# Patient Record
Sex: Male | Born: 1955
Health system: Southern US, Community
[De-identification: ages and names within clinical notes are randomized; demographics above are authoritative.]

## PROBLEM LIST (undated history)

## (undated) DIAGNOSIS — G4730 Sleep apnea, unspecified: Secondary | ICD-10-CM

## (undated) DIAGNOSIS — R55 Syncope and collapse: Secondary | ICD-10-CM

## (undated) DIAGNOSIS — J449 Chronic obstructive pulmonary disease, unspecified: Secondary | ICD-10-CM

## (undated) DIAGNOSIS — K649 Unspecified hemorrhoids: Secondary | ICD-10-CM

## (undated) DIAGNOSIS — I10 Essential (primary) hypertension: Secondary | ICD-10-CM

## (undated) DIAGNOSIS — D126 Benign neoplasm of colon, unspecified: Secondary | ICD-10-CM

## (undated) DIAGNOSIS — S86019A Strain of unspecified Achilles tendon, initial encounter: Secondary | ICD-10-CM

## (undated) DIAGNOSIS — E7800 Pure hypercholesterolemia, unspecified: Secondary | ICD-10-CM

## (undated) HISTORY — DX: Essential (primary) hypertension: I10

## (undated) HISTORY — DX: Sleep apnea, unspecified: G47.30

## (undated) HISTORY — PX: CARDIAC CATHETERIZATION: SHX172

## (undated) HISTORY — DX: Syncope and collapse: R55

## (undated) HISTORY — PX: OTHER SURGICAL HISTORY: SHX169

## (undated) HISTORY — PX: ABDOMINAL HERNIA REPAIR: SHX539

## (undated) HISTORY — DX: Unspecified hemorrhoids: K64.9

## (undated) HISTORY — DX: Strain of unspecified achilles tendon, initial encounter: S86.019A

## (undated) HISTORY — PX: HEMORRHOID SURGERY: SHX153

## (undated) HISTORY — PX: HERNIA REPAIR: SHX51

## (undated) HISTORY — DX: Benign neoplasm of colon, unspecified: D12.6

---

## 1995-01-07 HISTORY — PX: HERNIA REPAIR: SHX51

## 1997-01-17 HISTORY — PX: HERNIA REPAIR: SHX51

## 2001-10-21 HISTORY — PX: OTHER SURGICAL HISTORY: SHX169

## 2004-01-13 ENCOUNTER — Other Ambulatory Visit: Payer: Self-pay

## 2005-10-21 HISTORY — PX: FEMORAL HERNIA REPAIR: SUR1179

## 2005-11-21 ENCOUNTER — Ambulatory Visit: Payer: Self-pay | Admitting: General Surgery

## 2005-12-11 ENCOUNTER — Other Ambulatory Visit: Payer: Self-pay

## 2005-12-18 ENCOUNTER — Ambulatory Visit: Payer: Self-pay | Admitting: General Surgery

## 2005-12-18 HISTORY — PX: HERNIA REPAIR: SHX51

## 2006-10-21 HISTORY — PX: OTHER SURGICAL HISTORY: SHX169

## 2007-06-09 ENCOUNTER — Ambulatory Visit: Payer: Self-pay | Admitting: Family Medicine

## 2007-06-09 DIAGNOSIS — J45909 Unspecified asthma, uncomplicated: Secondary | ICD-10-CM | POA: Insufficient documentation

## 2007-06-09 DIAGNOSIS — R079 Chest pain, unspecified: Secondary | ICD-10-CM | POA: Insufficient documentation

## 2007-06-09 DIAGNOSIS — I10 Essential (primary) hypertension: Secondary | ICD-10-CM | POA: Insufficient documentation

## 2007-06-09 DIAGNOSIS — I152 Hypertension secondary to endocrine disorders: Secondary | ICD-10-CM | POA: Insufficient documentation

## 2007-06-09 DIAGNOSIS — Z6833 Body mass index (BMI) 33.0-33.9, adult: Secondary | ICD-10-CM | POA: Insufficient documentation

## 2007-06-09 DIAGNOSIS — M109 Gout, unspecified: Secondary | ICD-10-CM | POA: Insufficient documentation

## 2007-06-10 ENCOUNTER — Ambulatory Visit: Payer: Self-pay | Admitting: Cardiology

## 2007-06-12 ENCOUNTER — Inpatient Hospital Stay (HOSPITAL_BASED_OUTPATIENT_CLINIC_OR_DEPARTMENT_OTHER): Admission: RE | Admit: 2007-06-12 | Discharge: 2007-06-12 | Payer: Self-pay | Admitting: Cardiology

## 2007-06-12 ENCOUNTER — Ambulatory Visit: Payer: Self-pay | Admitting: Cardiology

## 2007-06-15 ENCOUNTER — Ambulatory Visit: Payer: Self-pay | Admitting: Family Medicine

## 2007-06-17 ENCOUNTER — Encounter: Payer: Self-pay | Admitting: Family Medicine

## 2007-06-17 DIAGNOSIS — R7303 Prediabetes: Secondary | ICD-10-CM | POA: Insufficient documentation

## 2007-06-17 DIAGNOSIS — E1169 Type 2 diabetes mellitus with other specified complication: Secondary | ICD-10-CM | POA: Insufficient documentation

## 2007-06-17 DIAGNOSIS — E785 Hyperlipidemia, unspecified: Secondary | ICD-10-CM | POA: Insufficient documentation

## 2007-06-17 LAB — CONVERTED CEMR LAB
ALT: 28 units/L (ref 0–53)
AST: 28 units/L (ref 0–37)
Albumin: 3.8 g/dL (ref 3.5–5.2)
Alkaline Phosphatase: 75 units/L (ref 39–117)
BUN: 10 mg/dL (ref 6–23)
Bilirubin, Direct: 0.1 mg/dL (ref 0.0–0.3)
CO2: 32 meq/L (ref 19–32)
Calcium: 9.4 mg/dL (ref 8.4–10.5)
Chloride: 102 meq/L (ref 96–112)
Cholesterol: 243 mg/dL (ref 0–200)
Creatinine, Ser: 0.8 mg/dL (ref 0.4–1.5)
Direct LDL: 157.9 mg/dL
GFR calc Af Amer: 132 mL/min
GFR calc non Af Amer: 109 mL/min
Glucose, Bld: 118 mg/dL — ABNORMAL HIGH (ref 70–99)
HDL: 47.6 mg/dL (ref >39.0)
Hemoglobin: 15.7 g/dL (ref 13.0–17.0)
Potassium: 4.7 meq/L (ref 3.5–5.1)
Sodium: 142 meq/L (ref 135–145)
TSH: 1.57 microintl units/mL (ref 0.35–5.50)
Total Bilirubin: 1 mg/dL (ref 0.3–1.2)
Total CHOL/HDL Ratio: 5.1
Total Protein: 6.7 g/dL (ref 6.0–8.3)
Triglycerides: 253 mg/dL (ref 0–149)
VLDL: 51 mg/dL — ABNORMAL HIGH (ref 0–40)

## 2007-06-23 ENCOUNTER — Ambulatory Visit: Payer: Self-pay | Admitting: Family Medicine

## 2007-06-23 LAB — CONVERTED CEMR LAB
Cholesterol, target level: 200 mg/dL
HDL goal, serum: 40 mg/dL
LDL Goal: 130 mg/dL

## 2007-06-24 LAB — CONVERTED CEMR LAB
BUN: 16 mg/dL (ref 6–23)
CO2: 32 meq/L (ref 19–32)
Calcium: 9.2 mg/dL (ref 8.4–10.5)
Chloride: 100 meq/L (ref 96–112)
Creatinine, Ser: 1 mg/dL (ref 0.4–1.5)
GFR calc Af Amer: 102 mL/min
GFR calc non Af Amer: 84 mL/min
Glucose, Bld: 106 mg/dL — ABNORMAL HIGH (ref 70–99)
Potassium: 4.1 meq/L (ref 3.5–5.1)
Sodium: 139 meq/L (ref 135–145)

## 2007-06-26 ENCOUNTER — Ambulatory Visit: Payer: Self-pay | Admitting: Cardiology

## 2007-08-19 ENCOUNTER — Encounter: Payer: Self-pay | Admitting: Family Medicine

## 2007-08-20 DIAGNOSIS — N411 Chronic prostatitis: Secondary | ICD-10-CM | POA: Insufficient documentation

## 2007-08-20 DIAGNOSIS — Z87442 Personal history of urinary calculi: Secondary | ICD-10-CM | POA: Insufficient documentation

## 2007-09-16 ENCOUNTER — Encounter (INDEPENDENT_AMBULATORY_CARE_PROVIDER_SITE_OTHER): Payer: Self-pay | Admitting: *Deleted

## 2007-10-27 ENCOUNTER — Ambulatory Visit: Payer: Self-pay | Admitting: Family Medicine

## 2007-11-02 ENCOUNTER — Ambulatory Visit: Payer: Self-pay | Admitting: Family Medicine

## 2007-11-02 LAB — CONVERTED CEMR LAB
ALT: 34 units/L (ref 0–53)
AST: 27 units/L (ref 0–37)
Albumin: 3.9 g/dL (ref 3.5–5.2)
Alkaline Phosphatase: 69 units/L (ref 39–117)
BUN: 9 mg/dL (ref 6–23)
Bilirubin, Direct: 0.1 mg/dL (ref 0.0–0.3)
CO2: 30 meq/L (ref 19–32)
Calcium: 9.2 mg/dL (ref 8.4–10.5)
Chloride: 99 meq/L (ref 96–112)
Cholesterol: 182 mg/dL (ref 0–200)
Creatinine, Ser: 0.9 mg/dL (ref 0.4–1.5)
GFR calc Af Amer: 115 mL/min
GFR calc non Af Amer: 95 mL/min
Glucose, Bld: 109 mg/dL — ABNORMAL HIGH (ref 70–99)
HDL: 58.8 mg/dL (ref >39.0)
LDL Cholesterol: 98 mg/dL (ref 0–99)
Potassium: 4.3 meq/L (ref 3.5–5.1)
Sodium: 137 meq/L (ref 135–145)
Total Bilirubin: 1.1 mg/dL (ref 0.3–1.2)
Total CHOL/HDL Ratio: 3.1
Total Protein: 6.8 g/dL (ref 6.0–8.3)
Triglycerides: 127 mg/dL (ref 0–149)
VLDL: 25 mg/dL (ref 0–40)

## 2007-11-12 ENCOUNTER — Telehealth: Payer: Self-pay | Admitting: Family Medicine

## 2007-12-08 ENCOUNTER — Ambulatory Visit: Payer: Self-pay | Admitting: Family Medicine

## 2007-12-08 DIAGNOSIS — I2510 Atherosclerotic heart disease of native coronary artery without angina pectoris: Secondary | ICD-10-CM | POA: Insufficient documentation

## 2008-02-05 ENCOUNTER — Ambulatory Visit: Payer: Self-pay | Admitting: Urology

## 2008-02-05 ENCOUNTER — Encounter: Payer: Self-pay | Admitting: Family Medicine

## 2008-02-09 ENCOUNTER — Ambulatory Visit: Payer: Self-pay | Admitting: Urology

## 2008-02-22 ENCOUNTER — Telehealth: Payer: Self-pay | Admitting: Family Medicine

## 2008-04-01 ENCOUNTER — Encounter: Payer: Self-pay | Admitting: Family Medicine

## 2008-04-29 ENCOUNTER — Ambulatory Visit: Payer: Self-pay | Admitting: Family Medicine

## 2008-05-04 ENCOUNTER — Ambulatory Visit: Payer: Self-pay | Admitting: Family Medicine

## 2008-05-04 DIAGNOSIS — M542 Cervicalgia: Secondary | ICD-10-CM | POA: Insufficient documentation

## 2008-05-04 DIAGNOSIS — L989 Disorder of the skin and subcutaneous tissue, unspecified: Secondary | ICD-10-CM | POA: Insufficient documentation

## 2008-05-04 LAB — CONVERTED CEMR LAB
ALT: 35 units/L (ref 0–53)
AST: 38 units/L — ABNORMAL HIGH (ref 0–37)
Albumin: 4 g/dL (ref 3.5–5.2)
Alkaline Phosphatase: 64 units/L (ref 39–117)
BUN: 13 mg/dL (ref 6–23)
Bilirubin, Direct: 0.1 mg/dL (ref 0.0–0.3)
CO2: 30 meq/L (ref 19–32)
Calcium: 9.3 mg/dL (ref 8.4–10.5)
Chloride: 103 meq/L (ref 96–112)
Cholesterol: 159 mg/dL (ref 0–200)
Creatinine, Ser: 0.9 mg/dL (ref 0.4–1.5)
GFR calc Af Amer: 114 mL/min
GFR calc non Af Amer: 95 mL/min
Glucose, Bld: 112 mg/dL — ABNORMAL HIGH (ref 70–99)
HDL: 60.4 mg/dL (ref >39.0)
LDL Cholesterol: 79 mg/dL (ref 0–99)
Potassium: 4.3 meq/L (ref 3.5–5.1)
Sodium: 141 meq/L (ref 135–145)
Total Bilirubin: 1.2 mg/dL (ref 0.3–1.2)
Total CHOL/HDL Ratio: 2.6
Total Protein: 6.6 g/dL (ref 6.0–8.3)
Triglycerides: 100 mg/dL (ref 0–149)
VLDL: 20 mg/dL (ref 0–40)

## 2008-06-06 ENCOUNTER — Telehealth: Payer: Self-pay | Admitting: Family Medicine

## 2008-09-06 ENCOUNTER — Telehealth: Payer: Self-pay | Admitting: Family Medicine

## 2008-10-24 ENCOUNTER — Encounter: Payer: Self-pay | Admitting: Family Medicine

## 2009-01-19 ENCOUNTER — Encounter: Payer: Self-pay | Admitting: Family Medicine

## 2009-06-23 ENCOUNTER — Ambulatory Visit: Payer: Self-pay | Admitting: Family Medicine

## 2009-06-26 LAB — CONVERTED CEMR LAB
ALT: 37 units/L (ref 0–53)
AST: 39 units/L — ABNORMAL HIGH (ref 0–37)
Albumin: 3.8 g/dL (ref 3.5–5.2)
Alkaline Phosphatase: 48 units/L (ref 39–117)
BUN: 15 mg/dL (ref 6–23)
Bilirubin, Direct: 0 mg/dL (ref 0.0–0.3)
CO2: 30 meq/L (ref 19–32)
Calcium: 9.1 mg/dL (ref 8.4–10.5)
Chloride: 105 meq/L (ref 96–112)
Cholesterol: 204 mg/dL — ABNORMAL HIGH (ref 0–200)
Creatinine, Ser: 0.8 mg/dL (ref 0.4–1.5)
Direct LDL: 127.4 mg/dL
GFR calc non Af Amer: 107.64 mL/min (ref >60)
Glucose, Bld: 116 mg/dL — ABNORMAL HIGH (ref 70–99)
HDL: 65 mg/dL (ref >39.00)
PSA: 0.32 ng/mL (ref 0.10–4.00)
Potassium: 4.1 meq/L (ref 3.5–5.1)
Sodium: 142 meq/L (ref 135–145)
Total Bilirubin: 0.8 mg/dL (ref 0.3–1.2)
Total CHOL/HDL Ratio: 3
Total Protein: 6.5 g/dL (ref 6.0–8.3)
Triglycerides: 104 mg/dL (ref 0.0–149.0)
VLDL: 20.8 mg/dL (ref 0.0–40.0)

## 2009-06-30 ENCOUNTER — Ambulatory Visit: Payer: Self-pay | Admitting: Family Medicine

## 2009-10-21 HISTORY — PX: COLONOSCOPY: SHX174

## 2009-12-28 ENCOUNTER — Encounter: Payer: Self-pay | Admitting: Family Medicine

## 2009-12-28 ENCOUNTER — Ambulatory Visit: Payer: Self-pay | Admitting: Urology

## 2010-03-08 ENCOUNTER — Encounter: Payer: Self-pay | Admitting: Family Medicine

## 2010-04-06 ENCOUNTER — Encounter: Payer: Self-pay | Admitting: Family Medicine

## 2010-04-06 DIAGNOSIS — D126 Benign neoplasm of colon, unspecified: Secondary | ICD-10-CM

## 2010-04-06 HISTORY — DX: Benign neoplasm of colon, unspecified: D12.6

## 2010-04-16 ENCOUNTER — Encounter: Payer: Self-pay | Admitting: Family Medicine

## 2010-06-09 LAB — HM COLONOSCOPY: HM Colonoscopy: 4

## 2010-07-21 DIAGNOSIS — S86019A Strain of unspecified Achilles tendon, initial encounter: Secondary | ICD-10-CM

## 2010-07-21 DIAGNOSIS — R55 Syncope and collapse: Secondary | ICD-10-CM

## 2010-07-21 HISTORY — DX: Syncope and collapse: R55

## 2010-07-21 HISTORY — DX: Strain of unspecified achilles tendon, initial encounter: S86.019A

## 2010-07-29 ENCOUNTER — Ambulatory Visit: Payer: Self-pay | Admitting: Cardiovascular Disease

## 2010-07-29 ENCOUNTER — Inpatient Hospital Stay: Payer: Self-pay | Admitting: Internal Medicine

## 2010-07-30 ENCOUNTER — Encounter: Payer: Self-pay | Admitting: Cardiology

## 2010-07-31 ENCOUNTER — Encounter: Payer: Self-pay | Admitting: Family Medicine

## 2010-08-01 ENCOUNTER — Encounter: Payer: Self-pay | Admitting: Cardiovascular Disease

## 2010-08-01 ENCOUNTER — Encounter: Payer: Self-pay | Admitting: Family Medicine

## 2010-08-02 ENCOUNTER — Telehealth (INDEPENDENT_AMBULATORY_CARE_PROVIDER_SITE_OTHER): Payer: Self-pay

## 2010-08-07 ENCOUNTER — Ambulatory Visit: Payer: Self-pay | Admitting: Cardiology

## 2010-08-07 DIAGNOSIS — R55 Syncope and collapse: Secondary | ICD-10-CM | POA: Insufficient documentation

## 2010-08-10 ENCOUNTER — Telehealth (INDEPENDENT_AMBULATORY_CARE_PROVIDER_SITE_OTHER): Payer: Self-pay | Admitting: *Deleted

## 2010-08-14 ENCOUNTER — Ambulatory Visit: Payer: Self-pay | Admitting: Family Medicine

## 2010-08-14 LAB — CONVERTED CEMR LAB
ALT: 69 units/L — ABNORMAL HIGH (ref 0–53)
AST: 41 units/L — ABNORMAL HIGH (ref 0–37)
Albumin: 3.6 g/dL (ref 3.5–5.2)
Alkaline Phosphatase: 58 units/L (ref 39–117)
BUN: 8 mg/dL (ref 6–23)
Bilirubin, Direct: 0.2 mg/dL (ref 0.0–0.3)
CO2: 28 meq/L (ref 19–32)
Calcium: 9 mg/dL (ref 8.4–10.5)
Chloride: 103 meq/L (ref 96–112)
Cholesterol: 178 mg/dL (ref 0–200)
Creatinine, Ser: 0.8 mg/dL (ref 0.4–1.5)
GFR calc non Af Amer: 102.71 mL/min (ref >60)
Glucose, Bld: 105 mg/dL — ABNORMAL HIGH (ref 70–99)
HDL: 50.9 mg/dL (ref >39.00)
LDL Cholesterol: 99 mg/dL (ref 0–99)
Potassium: 4.2 meq/L (ref 3.5–5.1)
Sodium: 138 meq/L (ref 135–145)
Total Bilirubin: 1.2 mg/dL (ref 0.3–1.2)
Total CHOL/HDL Ratio: 3
Total Protein: 6.2 g/dL (ref 6.0–8.3)
Triglycerides: 143 mg/dL (ref 0.0–149.0)
Uric Acid, Serum: 5.5 mg/dL (ref 4.0–7.8)
VLDL: 28.6 mg/dL (ref 0.0–40.0)

## 2010-08-21 ENCOUNTER — Ambulatory Visit: Payer: Self-pay | Admitting: Family Medicine

## 2010-08-21 DIAGNOSIS — R740 Nonspecific elevation of levels of transaminase and lactic acid dehydrogenase [LDH]: Secondary | ICD-10-CM

## 2010-08-21 DIAGNOSIS — R7402 Elevation of levels of lactic acid dehydrogenase (LDH): Secondary | ICD-10-CM | POA: Insufficient documentation

## 2010-08-21 DIAGNOSIS — R7401 Elevation of levels of liver transaminase levels: Secondary | ICD-10-CM | POA: Insufficient documentation

## 2010-08-22 ENCOUNTER — Telehealth: Payer: Self-pay | Admitting: Cardiology

## 2010-08-31 ENCOUNTER — Encounter: Payer: Self-pay | Admitting: Family Medicine

## 2010-09-03 ENCOUNTER — Encounter: Payer: Self-pay | Admitting: Family Medicine

## 2010-09-04 ENCOUNTER — Ambulatory Visit: Payer: Self-pay | Admitting: General Surgery

## 2010-09-06 ENCOUNTER — Telehealth: Payer: Self-pay | Admitting: Cardiology

## 2010-09-10 ENCOUNTER — Ambulatory Visit: Payer: Self-pay | Admitting: General Surgery

## 2010-09-10 ENCOUNTER — Encounter: Payer: Self-pay | Admitting: Family Medicine

## 2010-09-10 HISTORY — PX: HERNIA REPAIR: SHX51

## 2010-09-14 LAB — PATHOLOGY REPORT

## 2010-09-17 ENCOUNTER — Telehealth: Payer: Self-pay | Admitting: Family Medicine

## 2010-09-20 ENCOUNTER — Encounter: Payer: Self-pay | Admitting: Family Medicine

## 2010-10-25 ENCOUNTER — Encounter: Payer: Self-pay | Admitting: Family Medicine

## 2010-11-13 ENCOUNTER — Telehealth: Payer: Self-pay | Admitting: Family Medicine

## 2010-11-19 ENCOUNTER — Ambulatory Visit
Admission: RE | Admit: 2010-11-19 | Discharge: 2010-11-19 | Payer: Self-pay | Source: Home / Self Care | Attending: Family Medicine | Admitting: Family Medicine

## 2010-11-19 ENCOUNTER — Encounter: Payer: Self-pay | Admitting: Family Medicine

## 2010-11-19 ENCOUNTER — Other Ambulatory Visit: Payer: Self-pay | Admitting: Family Medicine

## 2010-11-19 LAB — HEPATIC FUNCTION PANEL
ALT: 18 U/L (ref 0–53)
AST: 20 U/L (ref 0–37)
Albumin: 3.7 g/dL (ref 3.5–5.2)
Alkaline Phosphatase: 74 U/L (ref 39–117)
Bilirubin, Direct: 0.1 mg/dL (ref 0.0–0.3)
Total Bilirubin: 0.8 mg/dL (ref 0.3–1.2)
Total Protein: 6.4 g/dL (ref 6.0–8.3)

## 2010-11-19 LAB — BASIC METABOLIC PANEL
BUN: 13 mg/dL (ref 6–23)
CO2: 28 mEq/L (ref 19–32)
Calcium: 8.7 mg/dL (ref 8.4–10.5)
Chloride: 100 mEq/L (ref 96–112)
Creatinine, Ser: 0.8 mg/dL (ref 0.4–1.5)
GFR: 107.07 mL/min (ref >60.00)
Glucose, Bld: 102 mg/dL — ABNORMAL HIGH (ref 70–99)
Potassium: 4.2 mEq/L (ref 3.5–5.1)
Sodium: 136 mEq/L (ref 135–145)

## 2010-11-21 LAB — CONVERTED CEMR LAB
HCV Ab: NEGATIVE
Hep A IgM: NEGATIVE
Hep B C IgM: NEGATIVE
Hepatitis B Surface Ag: NEGATIVE

## 2010-11-21 NOTE — Letter (Signed)
Summary: [facility]  [facility]   Imported By: Maryln Gottron 09/14/2010 10:32:18  _____________________________________________________________________  External Attachment:    Type:   Image     Comment:   External Document

## 2010-11-21 NOTE — Letter (Signed)
Summary: [facility]  [facility]   Imported By: Lanelle Bal 03/15/2010 09:02:22  _____________________________________________________________________  External Attachment:    Type:   Image     Comment:   External Document

## 2010-11-21 NOTE — Letter (Signed)
Summary: [facility]  [facility]   Imported By: Lanelle Bal 01/03/2010 14:23:19  _____________________________________________________________________  External Attachment:    Type:   Image     Comment:   External Document

## 2010-11-21 NOTE — Assessment & Plan Note (Signed)
Summary: CPX/CLE   Vital Signs:  Patient profile:   55 year old male Temp:     98.7 degrees F oral Pulse rate:   80 / minute Pulse rhythm:   regular BP sitting:   130 / 72  (left arm) Cuff size:   regular  Vitals Entered By: Linde Gillis CMA Duncan Dull) (August 21, 2010 12:23 PM) CC: complete physical, Lipid Management   History of Present Illness: 55 yo with history of left heart cath without significant disease in 2008, HTN, and hyperlipidemia presents for hospital followup.  Earlier this month, patient developed a respiratory illness with constant cough and wheezing.  It is likely that he had a URI that progressed to an asthma exacerbation.  He had a hard time sleeping due to cough.  He would get lightheaded with paroxysms of cough.  On 10/9, he was sitting on the side of the bed talking to his daughter when he abruptly lost consciousness and fell.  In the process, he caught his foot under the bed and tore the Achilles tendon on the right.  He had been up all night coughing prior to this, but it appears that he was not coughing directly before the syncopal event.  He regained consciousness quickly.  He had no prodrome of lightheadedness, palpitations, or chest pain.  He was admitted to [facility].  Echo there showed mild LVH, EF>55%, no significant valvular abnormalities.  CT head and carotid US were unremarkable.  Cardiac enzymes and D dimer negative.  No arrhythmias on telemetry.  His Achilles tendon was repaired and he is now in a cast.  It was recommended that he get a heart monitor at discharge, but he refused.    Since discharge, no problems with lightheadedness or syncope.  Cough has resolved.   He followed up with Dr. Shirlee Latch (Cards)(note reviewed) who recommended Holter event monitor.Ilene Qua refused.  Seeing Ortho (dr. Hyacinth Meeker) for achilles tear.  HTN..was elevated...cards increase lisinopril.Cody Kitchennow well controlled at home...118/70s  Acute bronchtitis treated Z-pack. Improved.  On  testosterone for hypogonadism..per Dr. Achilles Dunk urologist...has helped with energy.  Last prostate check in early 2011.   Elevated LFTS.Cody KitchenMarland Day? possible neg hepatitis test in past years...no hisotry of injectable drugs.  Does drink alcohol but none since discharge for hospital. No tylenol. Using ibuprofen nightly in past but since discharge..but took vicodin at night 2 tabs.  On statin med.  Per records nml in hospital  Lipid Management History:      Positive NCEP/ATP III risk factors include male age 27 years old or older, hypertension, and ASHD (either angina/prior MI/prior CABG).  Negative NCEP/ATP III risk factors include non-diabetic, no family history for ischemic heart disease, no prior stroke/TIA, and no peripheral vascular disease.        His compliance with the TLC diet is poor.     Problems Prior to Update: 1)  Dyspnea On Exertion  (ICD-786.09) 2)  Transaminases, Serum, Elevated  (ICD-790.4) 3)  Tremor, Essential  (ICD-333.1) 4)  Syncope and Collapse  (ICD-780.2) 5)  Physical Examination  (ICD-V70.0) 6)  Screening, Colon Cancer  (ICD-V76.51) 7)  Special Screening Malignant Neoplasm of Prostate  (ICD-V76.44) 8)  Skin Lesions, Multiple  (ICD-709.9) 9)  Neck Pain, Right  (ICD-723.1) 10)  Hip Pain, Left  (ICD-719.45) 11)  Cad  (ICD-414.00) 12)  Nephrolithiasis, Hx of  (ICD-V13.01) 13)  Prostatitis, Chronic  (ICD-601.1) 14)  Prediabetes  (ICD-790.29) 15)  Hyperlipidemia  (ICD-272.4) 16)  Chest Pain  (ICD-786.50) 17)  Gout  (  ICD-274.9) 18)  Family History Diabetes 1st Degree Relative  (ICD-V18.0) 19)  COPD  (ICD-496) 20)  Hypertension  (ICD-401.9) 21)  Morbid Obesity  (ICD-278.01)  Current Medications (verified): 1)  Multivitamins   Tabs (Multiple Vitamin) .... Once Daily 2)  Simvastatin 40 Mg  Tabs (Simvastatin) .... Take 1 Tablet By Mouth Once A Day 3)  Aspirin 81 Mg  Tabs (Aspirin) .... Take 1 Tablet By Mouth Once A Day 4)  Lisinopril 20 Mg Tabs (Lisinopril) .... Take One  Tablet By Mouth Daily 5)  Meloxicam 7.5 Mg Tabs (Meloxicam) .... One Tablet Once Daily 6)  Vitamin B-1 100 Mg Tabs (Thiamine Hcl) .... One Tablet Once Daily 7)  Advair Diskus 250-50 Mcg/dose Aepb (Fluticasone-Salmeterol) 8)  Fish Oil 1000 Mg Caps (Omega-3 Fatty Acids) .... One Tablet Once Daily 9)  Garlic Oil 1000 Mg Caps (Garlic) .... Takes Two Tablets Once Daily 10)  Glucosamine 500 Mg Caps (Glucosamine Sulfate) .... Two Tablets Daily 11)  Testosterone Cypionate 200 Mg/ml Oil (Testosterone Cypionate)  Allergies: 1)  ! Penicillin V Potassium (Penicillin V Potassium)  Past History:  Past medical, surgical, family and social histories (including risk factors) reviewed, and no changes noted (except as noted below).  Past Medical History: Reviewed history from 08/07/2010 and no changes required. 1. Hypertension 2. Asthma 3. Gout 4. Syncope: 10/11 in setting of asthma exacerbation with coughing. Echo (10/11): EF > 55%, mild LVH, grade I diastolic dysfunction, normal RV size and systolic function, normal valves.  Carotid US (10/11): Minimal disease.  5. Achilles tendon rupture and repair 10/11 6. LHC (2008): Minimal luminal irregularities.  EF 55%.   Past Surgical History: Reviewed history from 06/09/2007 and no changes required. treadmill stress test 2003 intestinal blockage as a child several abdominal hernia repairs 1998, 2000 femoral hernia  2007 kidney stone extraction, uric acid stones also calcium kidney stones hemmorhoid surgery 1980s  Family History: Reviewed history from 08/07/2010 and no changes required. father  HTN, cataracts, spots on lungs ? asbestosis, DM mother  MI at age 67 , CAD sister HTN, depression brothers colon polyps, HTN aunt colon cancer Family History Diabetes 1st degree relative  Social History: Reviewed history from 08/07/2010 and no changes required. pot smoking x 27 years Diet: poor diet, lots of fats 6 beers daily Quit smoking cigars    Occupation: sells lumbar, in milling area, very physical Regular exercise-no Drug use-no, used to use cocaine and methamphetamine Married Originally from [place]  Review of Systems       tremor..multiple family members with same. General:  Denies fatigue and fever. CV:  Denies chest pain or discomfort. Resp:  Complains of shortness of breath; denies sputum productive and wheezing; In hospital told he may have asthma.Cody Day GI:  Denies abdominal pain. GU:  Denies dysuria.  Physical Exam  General:  Well-developed,well-nourished,in no acute distress; alert,appropriate and cooperative throughout examination Eyes:  No corneal or conjunctival inflammation noted. EOMI. Perrla. Funduscopic exam benign, without hemorrhages, exudates or papilledema. Vision grossly normal. Ears:  External ear exam shows no significant lesions or deformities.  Otoscopic examination reveals clear canals, tympanic membranes are intact bilaterally without bulging, retraction, inflammation or discharge. Hearing is grossly normal bilaterally. Nose:  External nasal examination shows no deformity or inflammation. Nasal mucosa are pink and moist without lesions or exudates. Mouth:  Oral mucosa and oropharynx without lesions or exudates.  Teeth in good repair. Neck:  ttp over right SCM and anteriorly no carotid bruit or thyromegaly  Lungs:  Normal respiratory  effort, chest expands symmetrically. Lungs are clear to auscultation, no crackles or wheezes. Heart:  Normal rate and regular rhythm. S1 and S2 normal without gallop, murmur, click, rub or other extra sounds. Abdomen:  obese, soft, non-tender, normal bowel sounds, no distention, no masses, no guarding, no rigidity, and no rebound tenderness, reducible abdominal and umbilical hernia  Rectal:  Per Dr. Joseph Berkshire:  Per Dr. Achilles Dunk. Prostate:  Per Dr. Achilles Dunk Pulses:  R and L posterior tibial pulses are full and equal bilaterally  Extremities:  B varicosities Neurologic:   Resting tremor. alert & oriented X3, cranial nerves II-XII intact, strength normal in all extremities, and sensation intact to light touch.   Skin:  skin tag on right eyelid, ? seb ker on left upper chest Psych:  Cognition and judgment appear intact. Alert and cooperative with normal attention span and concentration. No apparent delusions, illusions, hallucinations   Impression & Recommendations:  Problem # 1:  PHYSICAL EXAMINATION (ICD-V70.0) The patient's preventative maintenance and recommended screening tests for an annual wellness exam were reviewed in full today. Brought up to date unless services declined.  Counselled on the importance of diet, exercise, and its role in overall health and mortality. The patient's FH and SH was reviewed, including their home life, tobacco status, and drug and alcohol status.     Problem # 2:  TRANSAMINASES, SERUM, ELEVATED (ICD-790.4) No ibuprofen or tylenol. Stay off vicodin.  Stay off alcohol. Recheck hepatic panel and acute hepatitis panel in 4 weeks.   Problem # 3:  DYSPNEA ON EXERTION (ICD-786.09) Recent respiratory illness..with significant SOB.Cody Kitcheneval further with Pulmonary lung function tests pre and post albuterol as he was told in hospital he may have asthma.   The following medications were removed from the medication list:    Metoprolol Succinate 50 Mg Xr24h-tab (Metoprolol succinate) .Cody Day... Take one tablet by mouth daily His updated medication list for this problem includes:    Lisinopril 20 Mg Tabs (Lisinopril) .Cody Day... Take one tablet by mouth daily    Advair Diskus 250-50 Mcg/dose Aepb (Fluticasone-salmeterol)  Orders: Misc. Referral (Misc. Ref)  Problem # 4:  TREMOR, ESSENTIAL (ICD-333.1) Likely familial..not currently interesed in med to treat.  Problem # 5:  SYNCOPE AND COLLAPSE (ICD-780.2) Likely vasovagal due to cough. per cards no clear  cardiac source.Cody KitchenMarland KitchenI agree with Holter moniter given V tach run in hopsital though.  Pt  will consider.    Complete Medication List: 1)  Multivitamins Tabs (Multiple vitamin) .... Once daily 2)  Simvastatin 40 Mg Tabs (Simvastatin) .... Take 1 tablet by mouth once a day 3)  Aspirin 81 Mg Tabs (Aspirin) .... Take 1 tablet by mouth once a day 4)  Lisinopril 20 Mg Tabs (Lisinopril) .... Take one tablet by mouth daily 5)  Meloxicam 7.5 Mg Tabs (Meloxicam) .... One tablet once daily 6)  Vitamin B-1 100 Mg Tabs (Thiamine hcl) .... One tablet once daily 7)  Advair Diskus 250-50 Mcg/dose Aepb (Fluticasone-salmeterol) 8)  Fish Oil 1000 Mg Caps (Omega-3 fatty acids) .... One tablet once daily 9)  Garlic Oil 1000 Mg Caps (Garlic) .... Takes two tablets once daily 10)  Glucosamine 500 Mg Caps (Glucosamine sulfate) .... Two tablets daily 11)  Testosterone Cypionate 200 Mg/ml Oil (Testosterone cypionate)  Lipid Assessment/Plan:      Based on NCEP/ATP III, the patient's risk factor category is "history of coronary disease, peripheral vascular disease, cerebrovascular disease, or aortic aneurysm".  The patient's lipid goals are as follows: Total cholesterol goal is 200;  LDL cholesterol goal is 130; HDL cholesterol goal is 40; Triglyceride goal is 150.  His LDL cholesterol goal has been met.    Patient Instructions: 1)  No ibuprofen or tylenol. 2)  Stay off vicodin. 3)   Stay off alcohol. 4)  Recheck hepatic panel and acute hepatitis panel in 4 weeks.  5)  Referral Appointment Information 6)  Day/Date: 7)  Time: 8)  Place/MD: 9)  Address: 10)  Phone/Fax: 11)  Patient given appointment information. Information/Orders faxed/mailed.  12)  Call if interested in treatment of familial tremor. 13)  Please schedule a follow-up appointment in 6 months .    Orders Added: 1)  Misc. Referral [Misc. Ref] 2)  Est. Patient 40-64 years [phone]    Current Allergies (reviewed today): ! PENICILLIN V POTASSIUM (PENICILLIN V POTASSIUM)

## 2010-11-21 NOTE — Miscellaneous (Signed)
Summary: Metoprolol  Clinical Lists Changes  Medications: Added new medication of METOPROLOL SUCCINATE 50 MG XR24H-TAB (METOPROLOL SUCCINATE) Take one tablet by mouth daily - Signed Rx of METOPROLOL SUCCINATE 50 MG XR24H-TAB (METOPROLOL SUCCINATE) Take one tablet by mouth daily;  #90 x 4;  Signed;  Entered by: Benedict Needy, RN;  Authorized by: Dossie Arbour MD;  Method used: Electronically to [facility] [address], [place], [place], [place]  [id number], Ph: [phone], Fax: [phone]    Prescriptions: METOPROLOL SUCCINATE 50 MG XR24H-TAB (METOPROLOL SUCCINATE) Take one tablet by mouth daily  #90 x 4   Entered by:   Benedict Needy, RN   Authorized by:   Dossie Arbour MD   Signed by:   Benedict Needy, RN on 08/01/2010   Method used:   Electronically to        [facility] (retail)       [address]       [place], [place]  [id number]       Ph: [phone]       Fax: [phone]   RxID:   [id number]

## 2010-11-21 NOTE — Progress Notes (Signed)
Summary: Medications  Phone Note Call from Patient   Caller: Patient Call For: [facility] Summary of Call: Pt called had medication questions.  Was started on lisinopril 10mg  and toprol 50mg  in hospital. On our medication list he was taking toprol 50 and lisinopril-hctz. LMOM TCB  Initial call taken by: Benedict Needy, RN,  August 02, 2010 5:16 PM  Follow-up for Phone Call        Pt instructed to continue taking medications ordered while in hospital and take BP's twice a day and bring those recordings into his appt with Dr. Shirlee Latch 10/18 Follow-up by: Benedict Needy, RN,  August 03, 2010 8:48 AM

## 2010-11-21 NOTE — Progress Notes (Signed)
Summary: Toprol rx  Phone Note Call from Patient Call back at Home Phone [phone]   Caller: SELF Call For: [facility] Summary of Call: Pt has questions about whether or not he is supposed to be taking Toperol-Pt is going into surgery Initial call taken by: Harlon Flor,  September 06, 2010 1:54 PM  Follow-up for Phone Call        Called spoke with pt, pt states his rx bottle for Metoprolol Succinate 50mg  once daily does not have any refills so he was unsure if he is to continue on this.  Advised pt rx sent by [place] to [facility] on 08/01/10 gave pt 90 tablets with 4 refills.  He should continue on this medication, and advised him TCB if runs into problems refilling rx at pharmacy.  Follow-up by: Cloyde Reams RN,  September 06, 2010 2:15 PM

## 2010-11-21 NOTE — Letter (Signed)
[facility]   [facility]   Imported By: Roderic Ovens 08/14/2010 09:07:19  _____________________________________________________________________  External Attachment:    Type:   Image     Comment:   External Document

## 2010-11-21 NOTE — Progress Notes (Signed)
Summary: BP check  Phone Note Outgoing Call   Call placed by: Benedict Needy, RN,  August 22, 2010 10:25 AM Call placed to: Patient Summary of Call: Called pt for 2 week BP check. LMOM TCB  Initial call taken by: Benedict Needy, RN,  August 22, 2010 10:26 AM  Follow-up for Phone Call        2 week BP check: 126/82-89, 129/77-77, 114/72-85, 136/83-75, 123/78-75, 132/84-84 Follow-up by: Benedict Needy, RN,  August 23, 2010 9:53 AM     Appended Document: BP check looks good.   Appended Document: BP check pt aware

## 2010-11-21 NOTE — Assessment & Plan Note (Signed)
Summary: post hospital/alt   Visit Type:  Initial Consult Primary Provider:  Kerby Nora MD  CC:  F/U [facility].  Denies chest pain or shortness of breath. Has not had any more spells of syncope.Marland Kitchen  History of Present Illness: 55 yo with history of left heart cath without significant disease in 2008, HTN, and hyperlipidemia presents for hospital followup.  Earlier this month, patient developed a respiratory illness with constant cough and wheezing.  It is likely that he had a URI that progressed to an asthma exacerbation.  He had a hard time sleeping due to cough.  He would get lightheaded with paroxysms of cough.  On 10/9, he was sitting on the side of the bed talking to his daughter when he abruptly lost consciousness and fell.  In the process, he caught his foot under the bed and tore the Achilles tendon on the right.  He had been up all night coughing prior to this, but it appears that he was not coughing directly before the syncopal event.  He regained consciousness quickly.  He had no prodrome of lightheadedness, palpitations, or chest pain.  He was admitted to [facility]  Echo there showed mild LVH, EF>55%, no significant valvular abnormalities.  CT head and carotid US were unremarkable.  Cardiac enzymes and D dimer negative.  No arrhythmias on telemetry.  His Achilles tendon was repaired and he is now in a cast.  It was recommended that he get a heart monitor at discharge, but he refused.    Since discharge, no problems with lightheadedness or syncope.  Cough has resolved.    ECG: NSR, inferior and anterolateral T wave inversions  Labs (10/11): cardiac enzymes negative x 3, D dimer normal, creatinine 0.84  Current Medications (verified): 1)  Multivitamins   Tabs (Multiple Vitamin) .... Once Daily 2)  Fish Oil   Oil (Fish Oil) .... Once Daily 3)  Simvastatin 40 Mg  Tabs (Simvastatin) .... Take 1 Tablet By Mouth Once A Day 4)  Aspirin 81 Mg  Tabs (Aspirin) .... Take 1 Tablet By Mouth Once A Day 5)   Metoprolol Succinate 50 Mg Xr24h-Tab (Metoprolol Succinate) .... Take One Tablet By Mouth Daily 6)  Lisinopril 10 Mg Tabs (Lisinopril) .... One Tablet Once Daily 7)  Meloxicam 7.5 Mg Tabs (Meloxicam) .... One Tablet Once Daily 8)  Hydrocodone-Acetaminophen 5-325 Mg Tabs (Hydrocodone-Acetaminophen) .Marland Kitchen.. 1- 2 Tablets Every 4- 6 Hours As Needed For Pain 9)  Vitamin B-1 100 Mg Tabs (Thiamine Hcl) .... One Tablet Once Daily 10)  Oyster Shell Calcium/d 500-200 Mg-Unit Tabs (Calcium-Vitamin D) .... One Tablet Once Daily 11)  Advair Diskus 250-50 Mcg/dose Aepb (Fluticasone-Salmeterol) 12)  Fish Oil 1000 Mg Caps (Omega-3 Fatty Acids) .... One Tablet Once Daily 13)  Garlic Oil 1000 Mg Caps (Garlic) .... Takes Two Tablets Once Daily 14)  Glucosamine 500 Mg Caps (Glucosamine Sulfate) .... Two Tablets Daily 15)  Testosterone Cypionate 200 Mg/ml Oil (Testosterone Cypionate)  Allergies (verified): 1)  ! Penicillin V Potassium (Penicillin V Potassium)  Past History:  Past Surgical History: Last updated: 06/09/2007 treadmill stress test 2003 intestinal blockage as a child several abdominal hernia repairs 1998, 2000 femoral hernia  2007 kidney stone extraction, uric acid stones also calcium kidney stones hemmorhoid surgery 1980s  Family History: Last updated: 08/07/2010 father  HTN, cataracts, spots on lungs ? asbestosis, DM mother  MI at age 39 , CAD sister HTN, depression brothers colon polyps, HTN aunt colon cancer Family History Diabetes 1st degree relative  Social  History: Last updated: 08/07/2010 pot smoking x 27 years Diet: poor diet, lots of fats 6 beers daily Quit smoking cigars  Occupation: sells lumbar, in milling area, very physical Regular exercise-no Drug use-no, used to use cocaine and methamphetamine Married Originally from [place]  Risk Factors: Exercise: no (06/09/2007)  Past Medical History: 1. Hypertension 2. Asthma 3. Gout 4. Syncope: 10/11 in setting of  asthma exacerbation with coughing. Echo (10/11): EF > 55%, mild LVH, grade I diastolic dysfunction, normal RV size and systolic function, normal valves.  Carotid US (10/11): Minimal disease.  5. Achilles tendon rupture and repair 10/11 6. LHC (2008): Minimal luminal irregularities.  EF 55%.   Family History: father  HTN, cataracts, spots on lungs ? asbestosis, DM mother  MI at age 52 , CAD sister HTN, depression brothers colon polyps, HTN aunt colon cancer Family History Diabetes 1st degree relative  Social History: pot smoking x 27 years Diet: poor diet, lots of fats 6 beers daily Quit smoking cigars  Occupation: sells lumbar, in milling area, very physical Regular exercise-no Drug use-no, used to use cocaine and methamphetamine Married Originally from [place]  Review of Systems       All systems reviewed and negative except as per HPI.   Vital Signs:  Patient profile:   55 year old male Height:      71.75 inches Pulse rate:   89 / minute BP sitting:   145 / 83  (left arm) Cuff size:   large  Vitals Entered By: Bishop Dublin, CMA (August 07, 2010 1:42 PM)  Physical Exam  General:  Well developed, well nourished, in no acute distress.  Obese.  Head:  normocephalic and atraumatic Nose:  no deformity, discharge, inflammation, or lesions Mouth:  Teeth, gums and palate normal. Oral mucosa normal. Neck:  Neck supple, no JVD. No masses, thyromegaly or abnormal cervical nodes.   Lungs:  Slight end expiratory wheezing bilaterally Heart:  Non-displaced PMI, chest non-tender; regular rate and rhythm, S1, S2 without murmurs, rubs or gallops. Carotid upstroke normal, no bruit.  Pedals normal pulses. No edema, no varicosities. Abdomen:  Bowel sounds positive; abdomen soft and non-tender without masses, organomegaly, or hernias noted. No hepatosplenomegaly. Msk:  Cast on right lower leg Extremities:  No clubbing or cyanosis. Neurologic:  Alert and oriented x 3. Skin:  Intact  without lesions or rashes. Psych:  Normal affect.   Impression & Recommendations:  Problem # 1:  SYNCOPE AND COLLAPSE (ICD-780.2) Patient had a syncopal episode during an asthma exacerbation with almost constant cough.  This would seem to have been most likely cough-related syncope (he would get lightheaded with paroxysms of coughing), but he does not remember coughing directly before the syncopal spell. Workup in the hospital was unremarkable with echo, head CT, telemetry.  It was recommended that he get an event monitor at discharge from the hospital but he did not want to do this.  I think that most likely the syncopal episode was a cough-syncope event, but cannot completely rule out arrhythmia.  I told him that I thought the safest thing would be to do a 3 week event monitor.  He does not want to do this.  He will return if he has any further episodes of lightheadedness or syncope and we will put him on a monitor at that time.   Problem # 2:  HYPERTENSION (ICD-401.9) BP too high, increase lisinopril to 20 mg daily. BMET, BP check in 2 wks.   Problem # 3:  CAD (ICD-414.00) Minimal luminal irregularities on 2008 cath.  Given risk factors, patient should continue ASA 81 mg daily.   Patient Instructions: 1)  Your physician has recommended you make the following change in your medication: INCREASE lisinopril 20mg  daily  2)  Your physician wants you to follow-up in:   1 year You will receive a reminder letter in the mail two months in advance. If you don't receive a letter, please call our office to schedule the follow-up appointment. 3)  Your physician has requested that you regularly monitor and record your blood pressure readings at home.  Please use the same machine at the same time of day to check your readings and record them to bring to your follow-up visit. Prescriptions: LISINOPRIL 20 MG TABS (LISINOPRIL) Take one tablet by mouth daily  #30 x 12   Entered by:   Benedict Needy, RN    Authorized by:   Marca Ancona, MD   Signed by:   Benedict Needy, RN on 08/07/2010   Method used:   Electronically to        [facility] (retail)       [address]       [place], [place]  [id number]       Ph: [phone]       Fax: [phone]   RxID:   [id number]

## 2010-11-21 NOTE — Progress Notes (Signed)
----   Converted from flag ---- ---- 08/09/2010 11:11 PM, Kerby Nora MD wrote: Dx 274.9 uric acid Dx CMET, lipids Dx 272. 0  ---- 08/09/2010 9:53 AM, Liane Comber CMA (AAMA) wrote: Lab orders please! Good Morning! This pt is scheduled for cpx labs Tuesday, which labs to draw and dx codes to use? Thanks Tasha ------------------------------

## 2010-11-21 NOTE — Progress Notes (Signed)
Summary: cant afford spiriva  Phone Note Call from Patient Call back at Home Phone [phone]   Caller: Patient Call For: Cody Nora MD Summary of Call: Pt is calling to let you know that he cant afford spiriva.   He is asking for something more affordable.  Uses [facility]. Initial call taken by: Lowella Petties CMA, AAMA,  September 17, 2010 3:05 PM  Follow-up for Phone Call        Is he using advair two times a dayas stated.. ... if so will add atrovent. ... if not add atrovent and take advair two times a day as directed.  Follow-up by: Cody Nora MD,  September 18, 2010 11:23 AM  Additional Follow-up for Phone Call Additional follow up Details #1::        Patient advised via message  on machine and also advised patient to call with any questions Additional Follow-up by: Benny Lennert CMA Duncan Dull),  September 18, 2010 11:26 AM    New/Updated Medications: ATROVENT HFA 17 MCG/ACT AERS (IPRATROPIUM BROMIDE HFA) 2 puff inhaled every 6 hours Prescriptions: ATROVENT HFA 17 MCG/ACT AERS (IPRATROPIUM BROMIDE HFA) 2 puff inhaled every 6 hours  #1 x 11   Entered and Authorized by:   Cody Nora MD   Signed by:   Cody Nora MD on 09/18/2010   Method used:   Electronically to        [facility] (retail)       [address]       [place], [place]  [id number]       Ph: [phone]       Fax: [phone]   RxID:   [id number]

## 2010-11-21 NOTE — Op Note (Signed)
Summary: Hernia Repair/[facility]  Hernia [facility]   Imported By: Lanelle Bal 09/20/2010 12:09:30  _____________________________________________________________________  External Attachment:    Type:   Image     Comment:   External Document

## 2010-11-21 NOTE — Letter (Signed)
Summary: [facility]   Imported By: Lanelle Bal 04/24/2010 11:48:14  _____________________________________________________________________  External Attachment:    Type:   Image     Comment:   External Document  Appended Document: Orders Update    Clinical Lists Changes  Observations: Added new observation of COLONNXTDUE: 04/06/2013 (04/24/2010 13:24)      Last Colonoscopy:  tubular adenoma (04/06/2010 10:23:46 PM) Colonoscopy Next Due:  3 yr

## 2010-11-21 NOTE — Procedures (Signed)
Summary: Colonoscopy/[facility]  Colonoscopy/[facility]   Imported By: Lanelle Bal 04/11/2010 09:59:50  _____________________________________________________________________  External Attachment:    Type:   Image     Comment:   External Document  Appended Document: Orders Update    Clinical Lists Changes  Observations: Added new observation of COLONNXTDUE: 04/06/2020 (04/12/2010 17:02) Added new observation of LST COLON DT: 04/06/2010 (04/06/2010 17:02) Added new observation of COLONOSCOPY: pending pathology (04/06/2010 17:02)        Last Colonoscopy:  Normal (10/21/1998 8:20:06 AM) Colonoscopy Result Date:  04/06/2010 Colonoscopy Result:  pending pathology

## 2010-11-22 NOTE — Letter (Signed)
Summary: [facility]   Imported By: Maryln Gottron 11/01/2010 14:32:07  _____________________________________________________________________  External Attachment:    Type:   Image     Comment:   External Document

## 2010-11-22 NOTE — Progress Notes (Signed)
Summary: pt needs labs  Phone Note Call from Patient Call back at Home Phone [phone]   Caller: Fredonia Highland Summary of Call: Pt's wife states pt is overdue for lab work to check his liver.  He was supposed to have had this done late november/ early dec, but he was in the hospital. Initial call taken by: Lowella Petties CMA, AAMA,  November 13, 2010 9:14 AM  Follow-up for Phone Call        CMET and acute hepatis Follow-up by: Kerby Nora MD,  November 13, 2010 10:09 AM  Additional Follow-up for Phone Call Additional follow up Details #1::        Patient wife advised and appt made.Consuello Masse CMA   Additional Follow-up by: Benny Lennert CMA Duncan Dull),  November 13, 2010 10:32 AM

## 2010-11-22 NOTE — Letter (Signed)
Summary: [facility]  [facility]   Imported By: Lanelle Bal 10/04/2010 15:52:21  _____________________________________________________________________  External Attachment:    Type:   Image     Comment:   External Document

## 2010-12-31 ENCOUNTER — Ambulatory Visit: Payer: Self-pay | Admitting: Family Medicine

## 2011-01-17 ENCOUNTER — Ambulatory Visit: Payer: Self-pay | Admitting: Urology

## 2011-02-01 ENCOUNTER — Ambulatory Visit: Payer: Self-pay | Admitting: Family Medicine

## 2011-02-04 ENCOUNTER — Ambulatory Visit: Payer: Self-pay | Admitting: Family Medicine

## 2011-02-05 ENCOUNTER — Ambulatory Visit: Payer: Self-pay | Admitting: Family Medicine

## 2011-03-05 NOTE — Assessment & Plan Note (Signed)
[facility]                            [place] OFFICE NOTE   Cody Day, Cody Day                          MRN:          [id number]  DATE:06/10/2007                            DOB:          11-07-1955    CHIEF COMPLAINT:  Chest pressure and shortness of breath after eating  and with exertion.   HISTORY OF PRESENT ILLNESS:  Cody Day is a 54 year old married white  male, father of two, who comes today referred for consultation by Cody Day with the above complaint.   For the last several months, he has had chest pressure and tightness  after eating. It is not associated with burping or belching. He has also  had some dyspnea on exertion and gets sweaty any time he tries to do  something along with some chest pressure.   He saw Cody Day in the office and was noted to be hypertensive as  well. He was told that he was hypertensive by Cody Day in the late 90s  and was started on Norvasc. He stopped it on his own after he lost some  weight and his pressure seemed to be dropping. He was also told that he  had angina back then.   PAST MEDICAL HISTORY:  He is intolerant of PENICILLIN. He has smoked for  a number of years, but he has recently quit.   CURRENT MEDICATIONS:  1. Lisinopril hydrochlorothiazide 10/12.5 mg which he was asked to      start yesterday, but he has not started.  2. Aspirin 81 mg daily which he has not started.  3. Allopurinol 300 mg daily.  4. Ibuprofen 200 mg b.i.d.  5. Multivitamin daily.  6. Fish oil daily.  7. Glucosamine chondroitin.   He does alcohol. Specification of amount was not given. His cholesterol  in the past has been in the 260 range. He does not exercise. He is  overweight.   SURGICAL HISTORY:  Hemorrhoid surgery in 1980. He has had 4 ventral  hernia repairs with multiple mesh placed, dates not given. He has had a  kidney stone removed, date not given.   FAMILY HISTORY:  Negative for premature  coronary disease except that his  mother had several stents in her early 47s.   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 He is married. He has two  children. He moved from [facility] to this region several years  ago.   REVIEW OF SYSTEMS:  Other than his HPI: He has history of a hiatal  hernia. No definite reflux symptoms. He has had some urinary tract  issues with his stones. He has had a history of gout. Otherwise, his  review of systems are negative.   PHYSICAL EXAMINATION:  VITAL SIGNS:  Height 5 foot 11.5 inches, weight  247. Blood pressure 148/98, pulse 68 and regular.  GENERAL:  He looks tired. His eyes are injected.  HEENT:  Otherwise normocephalic atraumatic, PERRLA, facial symmetry is  normal.  NECK:  Supple. Carotid upstrokes are equal bilaterally without bruits.  There is no JVD. Thyroid  is not enlarged. Trachea is midline.  LUNGS:  Clear.  HEART:  Reveals a poorly appreciated PMI. He has a normal S1, S2 without  gallop.  ABDOMEN:  Protuberant with good bowel sounds. No midline bruit. There is  no hepatomegaly.  EXTREMITIES:  No edema. Pulses are intact.  NEUROLOGIC:  Intact.  SKIN:  Unremarkable.   Electrocardiogram from Cody Day office shows some ST segment  depression in the lateral leads specifically V5 and V6.   ASSESSMENT:  1. Exertional angina and ischemic equivalence, rule obstructive      coronary disease. He has multiple risk factors.  2. New onset hypertension, though he was told that he had hypertension      in the late 90s.  3. Obesity.  4. Tobacco use.  5. Alcohol use, unknown quantity.  6. Untreated hyperlipidemia.   RECOMMENDATIONS:  I talked to Cody Day and his wife. I have recommended  an outpatient cardiac catheterization. Indication, risk, potential  benefits have been discussed. I have asked him to start aspirin 325 mg  daily, to start his Lisinopril hydrochlorothiazide. We will check  fasting lipids as part of his blood  work. He will most likely need a  statin.     Cody C. Daleen Squibb, MD, [facility]  Electronically Signed    TCW/MedQ  DD: 06/10/2007  DT: 06/11/2007  Job #: [id number]   cc:   Kerby Nora, MD

## 2011-03-05 NOTE — Cardiovascular Report (Signed)
NAMEJASAN, DOUGHTIE NO.:  [id number]   MEDICAL RECORD NO.:  [id number]          PATIENT TYPE:  OIB   LOCATION:  1962                         FACILITY:  [facility]   PHYSICIAN:  Jonelle Sidle, MD DATE OF BIRTH:  02-23-1956   DATE OF PROCEDURE:  06/12/2007  DATE OF DISCHARGE:                            CARDIAC CATHETERIZATION   REQUESTING CARDIOLOGIST:  Dr. Valera Castle.   INDICATIONS:  Cody Day is a 55 year old male with a history of tobacco  use, hyperlipidemia and recently diagnosed hypertension.  He has had  intermittent chest discomfort and is referred now for a diagnostic  cardiac catheterization to define the coronary anatomy.  The potential  risks and benefits were explained to him in advance and informed consent  was obtained.   PROCEDURES PERFORMED:  1. Left heart catheterization  2. Selective coronary angiography.  3. Left ventriculography.   ACCESS AND EQUIPMENT:  The area about the right femoral artery was  anesthetized with 1% lidocaine and a 4-French sheath was placed in the  right femoral artery via the modified Seldinger technique.  Standard  preformed 4-French JL-4 and JR-4 catheters were used for selective  coronary geography and an angled pigtail catheter was used for left  heart catheterization and left ventriculography.  All exchanges were  made over a wire.  A total of 100 mL Omnipaque were used.  The patient  tolerated the procedure well without immediate complications.   HEMODYNAMICS:  Aorta 116/73 mmHg.  Left ventricle 118/15 mmHg.   ANGIOGRAPHIC FINDINGS:  1. Left main coronary artery is free of significant flow-limiting      coronary atherosclerosis and gives rise to the left anterior      descending, a large ramus intermedius and circumflex vessels.  2. The left anterior descending is a medium caliber vessel.  There is      a small diagonal branch noted.  There are proximal minor luminal      irregularities including  approximately 20% stenosis but no flow-      limiting stenoses are noted.  3. There is a large branching ramus intermedius without significant      flow-limiting stenosis.  4. Circumflex vessel is medium in caliber.  There is a small AV groove      portion with branching distal obtuse marginal and a smaller more      proximal obtuse marginal.  There are minor proximal luminal      irregularities noted including approximately 20% stenosis.   The right coronary artery is large in caliber and gives off a large  posterior descending and large posterolateral system.  There is no  significant flow-limiting coronary atherosclerosis noted.   Left ventriculography was performed in the RAO projection and revealed  an ejection fraction of approximately 55% with no significant mitral  regurgitation.   DIAGNOSES:  1. Minor coronary atherosclerosis without any flow-limiting coronary      artery disease.  2. Left ventricular ejection fraction approximately 55% with a left      ventricle end-diastolic pressure of 15 mmHg and no significant  mitral regurgitation.   DISCUSSION:  I reviewed results with the patient, his family and with  Dr. Daleen Day by phone.  I would anticipate aggressive risk factor  modification.  We will also try proton pump inhibitor in case some of  his symptoms may have been due to a component of acid reflux or  esophagitis.  He will have follow-up arranged in the office with Dr.  Daleen Day in [place], [place].      Jonelle Sidle, MD  Electronically Signed     SGM/MEDQ  D:  06/12/2007  T:  06/13/2007  Job:  [id number]   cc:   Cody C. Daleen Squibb, MD, [facility]  Cody Nora, MD

## 2011-03-05 NOTE — Assessment & Plan Note (Signed)
[facility] OFFICE NOTE   FIONN, STRACKE                          MRN:          [id number]  DATE:06/26/2007                            DOB:          01/13/1956    Mr. Cunnington returns today after undergoing cardiac catheterization for  chest discomfort.   His catheterization showed nonobstructive coronary disease and normal  left ventricular function.  His LVEDP was 15, suggesting poor control of  his hypertension.   His blood pressure is better on Lisinopril/HCTZ 10/12.5.   His lipid panel showed a total cholesterol of 243, HDL 47.6,  triglycerides 253, LDL 157.9, and a VLDL of 51.  Dr. Ermalene Searing started him  on simvastatin 40 mg daily.  He is scheduled for followup blood work in  several months.   He has no complaints today.  He was placed on a proton pump inhibitor  which has helped his symptoms.   His blood pressure today is 142/88.  His pulse is 80 and irregular.  Weight is 241.  The rest of his exam is unchanged.   I spent about 15 minutes talking about secondary cardiac risk factor  modification including ideal levels of his LDL, weight, and blood  pressure.  I have advised him to continue with aspirin, simvastatin,  fish oil, Lisinopril/HCTZ.   I will plan on seeing him back on a p.r.n. basis.   Goal LDL less than 70.  Goal blood pressure less than or equal to  135/80.     Thomas C. Daleen Squibb, MD, [facility]  Electronically Signed    TCW/MedQ  DD: 06/26/2007  DT: 06/26/2007  Job #: [id number]   cc:   Kerby Nora, MD

## 2011-04-04 ENCOUNTER — Encounter: Payer: Self-pay | Admitting: Cardiovascular Disease

## 2011-07-22 ENCOUNTER — Encounter: Payer: Self-pay | Admitting: Family Medicine

## 2011-07-22 ENCOUNTER — Encounter: Payer: Self-pay | Admitting: *Deleted

## 2011-07-22 ENCOUNTER — Ambulatory Visit (INDEPENDENT_AMBULATORY_CARE_PROVIDER_SITE_OTHER): Payer: No Typology Code available for payment source | Admitting: Family Medicine

## 2011-07-22 DIAGNOSIS — J029 Acute pharyngitis, unspecified: Secondary | ICD-10-CM

## 2011-07-22 DIAGNOSIS — J449 Chronic obstructive pulmonary disease, unspecified: Secondary | ICD-10-CM

## 2011-07-22 MED ORDER — AZITHROMYCIN 250 MG PO TABS: ORAL_TABLET | ORAL | Status: AC

## 2011-07-22 MED ORDER — ALBUTEROL SULFATE HFA 108 (90 BASE) MCG/ACT IN AERS: 2.0000 | INHALATION_SPRAY | Freq: Four times a day (QID) | RESPIRATORY_TRACT | Status: DC | PRN

## 2011-07-22 NOTE — Assessment & Plan Note (Addendum)
Refuses strep test. Smptoms appear consistent with bacterial pharyngitits.. Pt has history of this frequently in past. Responds usually to azithromycin... Will treat. Follow up if not improving.

## 2011-07-22 NOTE — Progress Notes (Signed)
  Subjective:    Patient ID: Cody Day, male    DOB: 20-Jun-1956, 55 y.o.   MRN: [id number]  HPI 55 year old male with history of frequent tonsil infections... Presents with 3-4 days of swollen tonsils and sore throat. No runny nose, no congestion.  Shortness of breath at rest and with walking. Has noted worse wheeze in past few week, but has been having more issues in last few weeks... Would have use 2-3 days worth.  Feels tightness in chest, no cough.  Has hx of COPD, inadequate control in last 6 months. Using advair twice daily  Needs refill of albuterol as needed. Cannot afford atrovent.  Last PFTs: last year.   Refuses PFTs and strep test.    Review of Systems  Constitutional: Positive for fatigue. Negative for fever.  HENT: Negative for ear pain and ear discharge.   Eyes: Negative for pain.  Respiratory: Positive for chest tightness, shortness of breath and wheezing. Negative for cough.   Cardiovascular: Negative for chest pain, palpitations and leg swelling.       Objective:   Physical Exam  Constitutional: Vital signs are normal. He appears well-developed and well-nourished.  Non-toxic appearance. He does not appear ill. No distress.  HENT:  Head: Normocephalic and atraumatic.  Right Ear: Hearing, tympanic membrane, external ear and ear canal normal. No tenderness. No foreign bodies. Tympanic membrane is not retracted and not bulging.  Left Ear: Hearing, tympanic membrane, external ear and ear canal normal. No tenderness. No foreign bodies. Tympanic membrane is not retracted and not bulging.  Nose: Nose normal. No mucosal edema or rhinorrhea. Right sinus exhibits no maxillary sinus tenderness and no frontal sinus tenderness. Left sinus exhibits no maxillary sinus tenderness and no frontal sinus tenderness.  Mouth/Throat: Uvula is midline and mucous membranes are normal. Normal dentition. No uvula swelling or dental caries. Posterior oropharyngeal edema and posterior  oropharyngeal erythema present. No oropharyngeal exudate or tonsillar abscesses.  Eyes: Conjunctivae, EOM and lids are normal. Pupils are equal, round, and reactive to light. No foreign bodies found.  Neck: Trachea normal, normal range of motion and phonation normal. Neck supple. Carotid bruit is not present. No mass and no thyromegaly present.  Cardiovascular: Normal rate, regular rhythm, S1 normal, S2 normal, normal heart sounds, intact distal pulses and normal pulses.  Exam reveals no gallop.   No murmur heard. Pulmonary/Chest: Effort normal and breath sounds normal. No respiratory distress. He has no wheezes. He has no rhonchi. He has no rales.  Abdominal: Soft. Normal appearance and bowel sounds are normal. There is no hepatosplenomegaly. There is no tenderness. There is no rebound, no guarding and no CVA tenderness. No hernia.  Lymphadenopathy:       Head (right side): Tonsillar adenopathy present.       Head (left side): Tonsillar adenopathy present.    He has cervical adenopathy.       Right cervical: Superficial cervical adenopathy present.       Left cervical: Superficial cervical adenopathy present.  Neurological: He is alert. He has normal reflexes.  Skin: Skin is warm, dry and intact. No rash noted.  Psychiatric: He has a normal mood and affect. His speech is normal and behavior is normal. Judgment normal.          Assessment & Plan:

## 2011-07-22 NOTE — Assessment & Plan Note (Signed)
Poor control... Refuses PFTs due to cost. Has history of 10 years of smoking meth amphetamine. No current drug of tobacco use.  Increase advair to 500/50 as he cannot afford atrovent or Spiriva daily.  Follow up in next month at [facility].

## 2011-07-22 NOTE — Patient Instructions (Addendum)
Increase advair to 500/50 mg. Use albuterol as needed for wheeze. Start antibiotic for pharyngitis, call if not imrpoving in next 48-732 hours. Keep follow up in as scheduled. Cancel lab appt.

## 2011-08-19 ENCOUNTER — Telehealth: Payer: Self-pay | Admitting: Family Medicine

## 2011-08-19 ENCOUNTER — Other Ambulatory Visit (INDEPENDENT_AMBULATORY_CARE_PROVIDER_SITE_OTHER): Payer: No Typology Code available for payment source

## 2011-08-19 ENCOUNTER — Other Ambulatory Visit: Payer: Self-pay

## 2011-08-19 DIAGNOSIS — E785 Hyperlipidemia, unspecified: Secondary | ICD-10-CM

## 2011-08-19 DIAGNOSIS — Z125 Encounter for screening for malignant neoplasm of prostate: Secondary | ICD-10-CM

## 2011-08-19 DIAGNOSIS — M109 Gout, unspecified: Secondary | ICD-10-CM

## 2011-08-19 DIAGNOSIS — R7401 Elevation of levels of liver transaminase levels: Secondary | ICD-10-CM

## 2011-08-19 DIAGNOSIS — I10 Essential (primary) hypertension: Secondary | ICD-10-CM

## 2011-08-19 LAB — COMPREHENSIVE METABOLIC PANEL
ALT: 37 U/L (ref 0–53)
AST: 36 U/L (ref 0–37)
Albumin: 3.9 g/dL (ref 3.5–5.2)
Alkaline Phosphatase: 53 U/L (ref 39–117)
BUN: 15 mg/dL (ref 6–23)
CO2: 26 mEq/L (ref 19–32)
Calcium: 8.8 mg/dL (ref 8.4–10.5)
Chloride: 99 mEq/L (ref 96–112)
Creatinine, Ser: 1.1 mg/dL (ref 0.4–1.5)
GFR: 70.95 mL/min (ref >60.00)
Glucose, Bld: 110 mg/dL — ABNORMAL HIGH (ref 70–99)
Potassium: 4.4 mEq/L (ref 3.5–5.1)
Sodium: 135 mEq/L (ref 135–145)
Total Bilirubin: 0.8 mg/dL (ref 0.3–1.2)
Total Protein: 6.9 g/dL (ref 6.0–8.3)

## 2011-08-19 LAB — URIC ACID: Uric Acid, Serum: 8.4 mg/dL — ABNORMAL HIGH (ref 4.0–7.8)

## 2011-08-19 LAB — LIPID PANEL
Cholesterol: 162 mg/dL (ref 0–200)
HDL: 61.8 mg/dL (ref >39.00)
LDL Cholesterol: 71 mg/dL (ref 0–99)
Total CHOL/HDL Ratio: 3
Triglycerides: 147 mg/dL (ref 0.0–149.0)
VLDL: 29.4 mg/dL (ref 0.0–40.0)

## 2011-08-19 LAB — PSA: PSA: 0.69 ng/mL (ref 0.10–4.00)

## 2011-08-19 NOTE — Telephone Encounter (Signed)
Message copied by Excell Seltzer on Mon Aug 19, 2011  9:35 AM ------      Message from: Liane Comber C      Created: Thu Aug 15, 2011  8:43 AM      Regarding: Cpx labs Mon 10/29       Please order  future cpx labs for pt's upcomming lab appt.      Thanks      Rodney Booze

## 2011-08-23 ENCOUNTER — Encounter: Payer: Self-pay | Admitting: Family Medicine

## 2011-08-23 ENCOUNTER — Ambulatory Visit (INDEPENDENT_AMBULATORY_CARE_PROVIDER_SITE_OTHER): Payer: No Typology Code available for payment source | Admitting: Family Medicine

## 2011-08-23 VITALS — BP 160/84 | HR 80 | Temp 97.5°F | Ht 71.0 in | Wt 246.1 lb

## 2011-08-23 DIAGNOSIS — Z0000 Encounter for general adult medical examination without abnormal findings: Secondary | ICD-10-CM

## 2011-08-23 DIAGNOSIS — I2510 Atherosclerotic heart disease of native coronary artery without angina pectoris: Secondary | ICD-10-CM

## 2011-08-23 DIAGNOSIS — R7401 Elevation of levels of liver transaminase levels: Secondary | ICD-10-CM

## 2011-08-23 DIAGNOSIS — I10 Essential (primary) hypertension: Secondary | ICD-10-CM

## 2011-08-23 DIAGNOSIS — E785 Hyperlipidemia, unspecified: Secondary | ICD-10-CM

## 2011-08-23 DIAGNOSIS — R7309 Other abnormal glucose: Secondary | ICD-10-CM

## 2011-08-23 DIAGNOSIS — J449 Chronic obstructive pulmonary disease, unspecified: Secondary | ICD-10-CM

## 2011-08-23 DIAGNOSIS — M109 Gout, unspecified: Secondary | ICD-10-CM

## 2011-08-23 MED ORDER — ALLOPURINOL 100 MG PO TABS: 100.0000 mg | ORAL_TABLET | Freq: Every day | ORAL | Status: DC

## 2011-08-23 NOTE — Patient Instructions (Addendum)
Follow BP at home.. Call with measurements in 1 week. Goal <140/90. Work on low [employer], start exercise regimen as tolerated.  Decrease alcohol, as well other high uric acid foods.  Start allopurinol 100 mg daily.  Return in 1-2 months for recheck of uric acid. Talk with pharmacist or insurance about coverage for spiriva. Let me know if agreeable to starting.

## 2011-08-23 NOTE — Progress Notes (Signed)
  Subjective:    Patient ID: Cody Day, male    DOB: 16-Aug-1956, 55 y.o.   MRN: [id number]  HPI  The patient is here for annual wellness exam and preventative care.    Hypertension:  Poor control today on lisinopril Using medication without problems or lightheadedness:  Chest pain with exertion: None Edema:None Short of breath:None Average home BPs: not checking at home.Marland Kitchen He has only had 3 hours sleep at night. Last night.  Other issues: CAD, stable  Currently has dislocated ribs:  Seeing chiropractor  Elevated Cholesterol: Not LDL at  simvastatin 40 mg, using every other day. Using medications without problems: Muscle aches: None Diet compliance:moderate, eating a lot of chees, buffulo chicken. Exercise: Limited Other complaints:  COPD, Poor control... Refuses PFTs due to cost.  Has history of 10 years of smoking meth amphetamine. No current drug of tobacco use.  Did not tolerate  (he felt like it speeded him up) advair to 500/50 as he cannot afford atrovent or Spiriva daily.  Albuterol, using daily.   Gout: uric acid has increased from past... No recent gout flares. Des drink alcohol.   Prediabetes: Continued to be an issue, no improvement, poor diet.   Elevated LFTs: Resolved at last 2 checks   Review of Systems  Constitutional: Negative for fever and fatigue.  HENT: Negative for ear pain.   Eyes: Negative for pain.  Respiratory: Negative for cough and shortness of breath.   Cardiovascular: Negative for chest pain, palpitations and leg swelling.       Objective:   Physical Exam  Constitutional: He is oriented to person, place, and time. Vital signs are normal. He appears well-developed and well-nourished.  HENT:  Head: Normocephalic.  Right Ear: Hearing normal.  Left Ear: Hearing normal.  Nose: Nose normal.  Mouth/Throat: Oropharynx is clear and moist and mucous membranes are normal.  Neck: Trachea normal. Carotid bruit is not present. No mass and no  thyromegaly present.  Cardiovascular: Normal rate, regular rhythm and normal pulses.  Exam reveals no gallop, no distant heart sounds and no friction rub.   No murmur heard.      No peripheral edema  Pulmonary/Chest: Effort normal and breath sounds normal. No respiratory distress.  Genitourinary:       Per uro  Musculoskeletal:       ttp over right thoracic back over ribs.  Neurological: He is alert and oriented to person, place, and time.  Skin: Skin is warm, dry and intact. No rash noted.  Psychiatric: He has a normal mood and affect. His speech is normal and behavior is normal. Thought content normal.          Assessment & Plan:  Complete Physical Exam; The patient's preventative maintenance and recommended screening tests for an annual wellness exam were reviewed in full today. Brought up to date unless services declined.  Counselled on the importance of diet, exercise, and its role in overall health and mortality. The patient's FH and SH was reviewed, including their home life, tobacco status, and drug and alcohol status.   PSA/rectal : stable, has rectal at Dr. Achilles Dunk. Colon: 03/2010, polyps .Marland Kitchen Recommend repeat in 2 years.Marland Kitchenat [facility]. Vaccine: Td 2011, flu: refused.

## 2011-08-24 NOTE — Assessment & Plan Note (Signed)
Improved control. 

## 2011-08-24 NOTE — Assessment & Plan Note (Signed)
Stable asymptomatic. Working on control of risk factors.

## 2011-08-24 NOTE — Assessment & Plan Note (Signed)
Poor control... Minimal options given financial constraints. Recommend having them look into adding Spiriva to advair if can afford.  Also consider discussing with financial advisor here or changing to indigent care clinic for better med affordability.

## 2011-08-24 NOTE — Assessment & Plan Note (Signed)
Improved control. LDL not at goal <70.

## 2011-08-24 NOTE — Assessment & Plan Note (Signed)
Uric acid hish.. Moderate diet, does drink alcohol.  Info given on diet changes. Hx of uric acid stones. Will start on allopurinol 100 mg daily given no current flare. Follow up with recheck in 4-6 weeks for uric acid.

## 2011-08-24 NOTE — Assessment & Plan Note (Signed)
Elevated today, per pt usually lower.. Will follow at home. Call if greater than 140/90. Continue current meds.

## 2011-08-24 NOTE — Assessment & Plan Note (Addendum)
No change in  control. Encouraged exercise, weight loss, healthy eating habits (low carb diet discussed in detail).

## 2011-09-18 ENCOUNTER — Other Ambulatory Visit: Payer: Self-pay | Admitting: *Deleted

## 2011-09-24 ENCOUNTER — Other Ambulatory Visit: Payer: Self-pay | Admitting: *Deleted

## 2011-09-24 ENCOUNTER — Other Ambulatory Visit: Payer: Self-pay | Admitting: Internal Medicine

## 2011-09-24 MED ORDER — LISINOPRIL-HYDROCHLOROTHIAZIDE 20-12.5 MG PO TABS: 1.0000 | ORAL_TABLET | Freq: Every day | ORAL | Status: DC

## 2011-09-24 NOTE — Telephone Encounter (Signed)
Faxed Rx for blood pressure to pharmacy.

## 2011-11-12 ENCOUNTER — Emergency Department (HOSPITAL_COMMUNITY): Payer: No Typology Code available for payment source

## 2011-11-12 ENCOUNTER — Emergency Department (HOSPITAL_COMMUNITY)
Admission: EM | Admit: 2011-11-12 | Discharge: 2011-11-12 | Disposition: A | Discharge status: Home / Self Care | Payer: No Typology Code available for payment source | Attending: Emergency Medicine | Admitting: Emergency Medicine

## 2011-11-12 ENCOUNTER — Telehealth: Payer: Self-pay | Admitting: Family Medicine

## 2011-11-12 ENCOUNTER — Encounter (HOSPITAL_COMMUNITY): Payer: Self-pay | Admitting: Emergency Medicine

## 2011-11-12 ENCOUNTER — Other Ambulatory Visit: Payer: Self-pay

## 2011-11-12 DIAGNOSIS — J449 Chronic obstructive pulmonary disease, unspecified: Secondary | ICD-10-CM | POA: Insufficient documentation

## 2011-11-12 DIAGNOSIS — J4489 Other specified chronic obstructive pulmonary disease: Secondary | ICD-10-CM | POA: Insufficient documentation

## 2011-11-12 DIAGNOSIS — I498 Other specified cardiac arrhythmias: Secondary | ICD-10-CM | POA: Insufficient documentation

## 2011-11-12 DIAGNOSIS — M109 Gout, unspecified: Secondary | ICD-10-CM | POA: Insufficient documentation

## 2011-11-12 DIAGNOSIS — Z7982 Long term (current) use of aspirin: Secondary | ICD-10-CM | POA: Insufficient documentation

## 2011-11-12 DIAGNOSIS — I10 Essential (primary) hypertension: Secondary | ICD-10-CM | POA: Insufficient documentation

## 2011-11-12 DIAGNOSIS — R0609 Other forms of dyspnea: Secondary | ICD-10-CM | POA: Insufficient documentation

## 2011-11-12 DIAGNOSIS — R079 Chest pain, unspecified: Secondary | ICD-10-CM | POA: Insufficient documentation

## 2011-11-12 DIAGNOSIS — R0989 Other specified symptoms and signs involving the circulatory and respiratory systems: Secondary | ICD-10-CM | POA: Insufficient documentation

## 2011-11-12 DIAGNOSIS — R209 Unspecified disturbances of skin sensation: Secondary | ICD-10-CM | POA: Insufficient documentation

## 2011-11-12 DIAGNOSIS — Z79899 Other long term (current) drug therapy: Secondary | ICD-10-CM | POA: Insufficient documentation

## 2011-11-12 HISTORY — DX: Chronic obstructive pulmonary disease, unspecified: J44.9

## 2011-11-12 LAB — CARDIAC PANEL(CRET KIN+CKTOT+MB+TROPI)
CK, MB: 2.9 ng/mL (ref 0.3–4.0)
Relative Index: 2.3 (ref 0.0–2.5)
Total CK: 125 U/L (ref 7–232)
Troponin I: <0.3 ng/mL (ref <0.30)

## 2011-11-12 LAB — COMPREHENSIVE METABOLIC PANEL
ALT: 36 U/L (ref 0–53)
AST: 40 U/L — ABNORMAL HIGH (ref 0–37)
Albumin: 3.9 g/dL (ref 3.5–5.2)
Alkaline Phosphatase: 51 U/L (ref 39–117)
BUN: 11 mg/dL (ref 6–23)
CO2: 26 mEq/L (ref 19–32)
Calcium: 9.4 mg/dL (ref 8.4–10.5)
Chloride: 97 mEq/L (ref 96–112)
Creatinine, Ser: 0.74 mg/dL (ref 0.50–1.35)
GFR calc Af Amer: >90 mL/min (ref >90)
GFR calc non Af Amer: >90 mL/min (ref >90)
Glucose, Bld: 109 mg/dL — ABNORMAL HIGH (ref 70–99)
Potassium: 3.7 mEq/L (ref 3.5–5.1)
Sodium: 136 mEq/L (ref 135–145)
Total Bilirubin: 1 mg/dL (ref 0.3–1.2)
Total Protein: 7.4 g/dL (ref 6.0–8.3)

## 2011-11-12 LAB — POCT I-STAT, CHEM 8
BUN: 10 mg/dL (ref 6–23)
Calcium, Ion: 1.05 mmol/L — ABNORMAL LOW (ref 1.12–1.32)
Chloride: 99 mEq/L (ref 96–112)
Creatinine, Ser: 0.9 mg/dL (ref 0.50–1.35)
Glucose, Bld: 109 mg/dL — ABNORMAL HIGH (ref 70–99)
HCT: 47 % (ref 39.0–52.0)
Hemoglobin: 16 g/dL (ref 13.0–17.0)
Potassium: 3.9 mEq/L (ref 3.5–5.1)
Sodium: 136 mEq/L (ref 135–145)
TCO2: 26 mmol/L (ref 0–100)

## 2011-11-12 LAB — CBC
HCT: 43.2 % (ref 39.0–52.0)
Hemoglobin: 13.5 g/dL (ref 13.0–17.0)
MCH: 21.8 pg — ABNORMAL LOW (ref 26.0–34.0)
MCHC: 31.3 g/dL (ref 30.0–36.0)
MCV: 69.7 fL — ABNORMAL LOW (ref 78.0–100.0)
Platelets: 279 10*3/uL (ref 150–400)
RBC: 6.2 MIL/uL — ABNORMAL HIGH (ref 4.22–5.81)
RDW: 17.4 % — ABNORMAL HIGH (ref 11.5–15.5)
WBC: 8.8 10*3/uL (ref 4.0–10.5)

## 2011-11-12 LAB — TROPONIN I: Troponin I: <0.3 ng/mL (ref <0.30)

## 2011-11-12 MED ORDER — IPRATROPIUM BROMIDE 0.02 % IN SOLN
RESPIRATORY_TRACT | Status: AC
  Administered 2011-11-12: 0.5 mg via RESPIRATORY_TRACT
  Filled 2011-11-12: qty 2.5

## 2011-11-12 MED ORDER — ASPIRIN 81 MG PO CHEW
324.0000 mg | CHEWABLE_TABLET | Freq: Once | ORAL | Status: AC
  Administered 2011-11-12: 324 mg via ORAL
  Filled 2011-11-12: qty 4

## 2011-11-12 MED ORDER — ALBUTEROL SULFATE (5 MG/ML) 0.5% IN NEBU
5.0000 mg | INHALATION_SOLUTION | Freq: Once | RESPIRATORY_TRACT | Status: AC
  Administered 2011-11-12: 5 mg via RESPIRATORY_TRACT
  Filled 2011-11-12: qty 1

## 2011-11-12 MED ORDER — IPRATROPIUM BROMIDE 0.02 % IN SOLN
0.5000 mg | Freq: Once | RESPIRATORY_TRACT | Status: AC
  Administered 2011-11-12: 0.5 mg via RESPIRATORY_TRACT

## 2011-11-12 MED ORDER — PREDNISONE 10 MG PO TABS: 20.0000 mg | ORAL_TABLET | Freq: Every day | ORAL | Status: DC

## 2011-11-12 MED ORDER — IOHEXOL 300 MG/ML  SOLN
100.0000 mL | Freq: Once | INTRAMUSCULAR | Status: AC | PRN
  Administered 2011-11-12: 100 mL via INTRAVENOUS

## 2011-11-12 MED ORDER — MORPHINE SULFATE 4 MG/ML IJ SOLN
4.0000 mg | Freq: Once | INTRAMUSCULAR | Status: AC
  Administered 2011-11-12: 4 mg via INTRAVENOUS
  Filled 2011-11-12: qty 1

## 2011-11-12 NOTE — ED Notes (Signed)
Cp x 1 week and then his arms started to get weak nauseated yesterday clammy

## 2011-11-12 NOTE — ED Notes (Addendum)
Pt states has has chest pain x1 week with numbness in both arms and hands. Pt describes pain mostly as discomfort and pressure. Pt states he also has random sharp pains since august in chest, abdomen and back. Pt also c/o headache since this am

## 2011-11-12 NOTE — Telephone Encounter (Signed)
Patient called office given he did not feel he needed to call (11.  He flet symptoms more likely due to COPD, but he is having no SOB, no cough, no congestion, no fever.  Describes as chest tightness, tingling in arms.  Recommended pt go to ER for evaluation.. By either EMS or wife driving. Not appropriate to wait for appt to be deen given pt hx of CAD and other risk factors for CAD including high chol, HTN etc.  Pt notified by Sydell Axon over phone.

## 2011-11-12 NOTE — ED Notes (Signed)
Pt transported to xray 

## 2011-11-12 NOTE — ED Provider Notes (Signed)
History     CSN: [id number]  Arrival date & time 11/12/11  1101   First MD Initiated Contact with Patient 11/12/11 1224      Chief Complaint  Patient presents with  . Chest Pain    (Consider location/radiation/quality/duration/timing/severity/associated sxs/prior treatment)  HPI 56 year old white male with COPD and HTN presents today with chest pressure and dyspnea. He states that he has been experiencing the chest heaviness on and off for about 1 week now. Radiates to his back. He states that it has lasted longer than usual today and won't go away. He also reports paresthesias in both hands. He states the tingling began in his R hand about 1 week ago and now it is in both hands and runs up his R arm. He also reports cold sweats, weakness, and nausea. He denies cough and fever.  Past Medical History  Diagnosis Date  . Hypertension   . Asthma   . Gout   . Syncope 10/11    in settin gof asthma exacerbation w coughing. Ech (10/11): EF > 55%, mild LVH, grade I diastolic dysfunction, nomral RV size and systolic function,. normal valves. Carotid US (10/11): minimal disease  . Achilles tendon rupture 10/11    and repair  . COPD (chronic obstructive pulmonary disease)     Past Surgical History  Procedure Date  . Treadmill stress test 2003  . Intestinal blockage     as a child  . Abdominal hernia repair [id number]  . Femoral hernia repair 2007  . Left heart cath 2008    minimal luminal irregularities EF 55%  . Kidney stone extraction     unic acid stones  . Calcium kidney stones   . Hemmorhoid surgery 1980s    Family History  Problem Relation Age of Onset  . Heart attack Mother 66  . Coronary artery disease Mother   . Hypertension Father   . Cataracts Father   . Diabetes Father   . Hypertension Sister   . Depression Sister   . Hypertension Brother   . Colon cancer Brother   . Diabetes Other     1st degree relative    History  Substance Use Topics  . Smoking status:  Former Games developer  . Smokeless tobacco: Not on file   Comment: quit smoking cigars  . Alcohol Use: Yes     6 beers daily; used to use cocaine and methamphetamine      Review of Systems All pertinent positives and negatives reviewed in the history of present illness.  Allergies  Penicillins  Home Medications   Current Outpatient Rx  Name Route Sig Dispense Refill  . ALBUTEROL SULFATE HFA 108 (90 BASE) MCG/ACT IN AERS Inhalation Inhale 2 puffs into the lungs every 6 (six) hours as needed for wheezing. 1 Inhaler 3  . ALLOPURINOL 100 MG PO TABS Oral Take 1 tablet (100 mg total) by mouth daily. 30 tablet 11  . ASPIRIN 81 MG PO TABS Oral Take 81 mg by mouth daily.      Marland Kitchen FLUTICASONE-SALMETEROL 500-50 MCG/DOSE IN AEPB Inhalation Inhale 1 puff into the lungs every 12 (twelve) hours.      Marland Kitchen GARLIC OIL 1000 MG PO CAPS Oral Take by mouth daily. Take 2 tabs     . GLUCOSAMINE 500 MG PO CAPS Oral Take by mouth. Take 2 tabs     . LISINOPRIL-HYDROCHLOROTHIAZIDE 20-12.5 MG PO TABS Oral Take 1 tablet by mouth daily. 30 tablet 3  . MULTIVITAL PO TABS  Oral Take 1 tablet by mouth daily.      Marland Kitchen FISH OIL 1000 MG PO CAPS Oral Take by mouth daily.      Marland Kitchen SIMVASTATIN 40 MG PO TABS Oral Take 40 mg by mouth daily.      . TESTOSTERONE CYPIONATE 200 MG/ML IM OIL Intramuscular Inject into the muscle daily.      Marland Kitchen VITAMIN B-1 100 MG PO TABS Oral Take 100 mg by mouth daily.        BP 134/119  Pulse 110  Temp(Src) 98.1 F (36.7 C) (Oral)  Resp 20  SpO2 99% RA  Physical Exam  Constitutional: He is oriented to person, place, and time. He appears well-developed and well-nourished. He appears distressed.  HENT:  Head: Normocephalic and atraumatic.  Neck: Normal range of motion. Neck supple.  Cardiovascular: Regular rhythm, normal heart sounds and intact distal pulses.  Tachycardia present.   Pulmonary/Chest: Breath sounds normal. Tachypnea noted. No respiratory distress.  Abdominal: Soft. Normal appearance and  bowel sounds are normal.  Musculoskeletal: Normal range of motion.  Neurological: He is alert and oriented to person, place, and time.  Skin: Skin is warm and dry. He is not diaphoretic.  Psychiatric: He has a normal mood and affect. His behavior is normal.    ED Course  Procedures (including critical care time)  Labs Reviewed  CBC - Abnormal; Notable for the following:    RBC 6.20 (*)    MCV 69.7 (*)    MCH 21.8 (*)    RDW 17.4 (*)    All other components within normal limits  I-STAT TROPONIN I  COMPREHENSIVE METABOLIC PANEL      Patient will be evaluated with 2 sets of markers and his CTA of his chest was negative.  Patient admitted to the CDU for further evaluation and care.  His chest pain is constant for a week and seems atypical for cardiac disease.    MDM     Date: 11/12/2011  Rate:107  Rhythm: sinus tachycardia and premature ventricular contractions (PVC)  QRS Axis: normal  Intervals: normal  ST/T Wave abnormalities: nonspecific T wave changes  Conduction Disutrbances:none  Narrative Interpretation:   Old EKG Reviewed: unchanged        Carlyle Dolly, PA-C 11/12/11 1710

## 2011-11-12 NOTE — ED Provider Notes (Signed)
Lab and radiology results reviewed and discussed with patient. Cardiac markers negative x 2.  Patient feeling better at present.  Lungs CTA bilaterally.  S1/S2, RRR, no murmur.  Abdomen soft, non-tender.  Will discharge home with short course of steroids, to follow-up with his PCP.  Jimmye Norman, NP 11/12/11 2126

## 2011-11-12 NOTE — ED Notes (Signed)
Patient transported to CT 

## 2011-11-12 NOTE — Telephone Encounter (Signed)
Triage Record Num: [id number] Operator: Meribeth Mattes Patient Name: Cody Day Call Date & Time: 11/12/2011 8:44:27AM Patient Phone: [phone] PCP: Kerby Nora Patient Gender: Male PCP Fax : [phone] Patient DOB: 01-21-1956 Practice Name[facility] Reason for Call: Caller: Jordell/Patient; PCP: Excell Seltzer.; CB#: [phone]; Call regarding Chest Pain/Chest Discomfort, feels heavy, arms numb at time, currently hands numb RN advised pt to call EMS 911. Family present, Pt voiced understanding. Protocol(s) Used: Chest Pain Recommended Outcome per Protocol: Activate EMS 911 Reason for Outcome: Pressure, fullness, squeezing sensation or pain anywhere in the chest lasting 5 or more minutes now or within the last hour. Pain is NOT associated with taking a deep breath or a productive cough, movement, or touch to a localized area on the chest. Care Advice: ~ 11/12/2011 8:49:55AM Page 1 of 1 [id number]

## 2011-11-12 NOTE — ED Notes (Signed)
Provider at bedside

## 2011-11-12 NOTE — ED Provider Notes (Signed)
Medical screening examination/treatment/procedure(s) were performed by non-physician practitioner and as supervising physician I was immediately available for consultation/collaboration.  Willam Munford R. Pepe Mineau, MD 11/12/11 2330 

## 2011-11-13 ENCOUNTER — Telehealth: Payer: Self-pay | Admitting: *Deleted

## 2011-11-13 DIAGNOSIS — G4730 Sleep apnea, unspecified: Secondary | ICD-10-CM

## 2011-11-13 NOTE — Telephone Encounter (Signed)
Patient wanted to know if he can get a referral to have a sleep study done for sleep apnea.  Please advise.

## 2011-11-14 ENCOUNTER — Telehealth: Payer: Self-pay | Admitting: Family Medicine

## 2011-11-14 NOTE — ED Provider Notes (Signed)
Medical screening examination/treatment/procedure(s) were conducted as a shared visit with non-physician practitioner(s) and myself.  I personally evaluated the patient during the encounter  Loren Racer, MD 11/14/11 0800

## 2011-11-14 NOTE — Telephone Encounter (Signed)
I believe referral already sent .Marland Kitchen Correct?

## 2011-11-14 NOTE — Telephone Encounter (Signed)
Patient was at [facility] ER on 11/12/11.  Patient was having heavy chest and tingling in his hands.  The ER doctor recommended that patient should go ahead and have a sleep study.  Patient would like to be referred for the sleep study.

## 2011-12-05 ENCOUNTER — Ambulatory Visit (INDEPENDENT_AMBULATORY_CARE_PROVIDER_SITE_OTHER): Payer: No Typology Code available for payment source | Admitting: Pulmonary Disease

## 2011-12-05 ENCOUNTER — Encounter: Payer: Self-pay | Admitting: Pulmonary Disease

## 2011-12-05 VITALS — BP 160/78 | HR 94 | Temp 98.8°F | Ht 71.0 in | Wt 247.4 lb

## 2011-12-05 DIAGNOSIS — G4733 Obstructive sleep apnea (adult) (pediatric): Secondary | ICD-10-CM | POA: Insufficient documentation

## 2011-12-05 NOTE — Patient Instructions (Signed)
Will set up for a sleep study, and arrange followup once the results are available. Work on weight loss  

## 2011-12-05 NOTE — Assessment & Plan Note (Signed)
The patient's history is very suggestive of clinically significant sleep apnea.  He has loud snoring, and abnormal breathing pattern during sleep, very frequent awakenings, and nonrestorative sleep.  He also has significant sleep pressure with periods of inactivity.  I had long discussion with him regarding the pathophysiology of sleep apnea, including its impact on his quality of life and cardiovascular health.  He is agreeable to having a sleep study for diagnosis.

## 2011-12-05 NOTE — Progress Notes (Signed)
  Subjective:    Patient ID: Cody Day, male    DOB: 1955/11/03, 56 y.o.   MRN: [id number]  HPI The patient is a 56 year old male who I've been asked to see for possible sleep apnea.  The patient's been noted to have loud snoring, as well as an abnormal breathing pattern during sleep by his wife.  He has very frequent awakenings during the night, and is not rested in the mornings upon arising.  Patient states very busy at work, and really does not have an opportunity to exhibit daytime sleepiness.  However, at lunch, he does have sleep pressure.  He also will follow sleep in the evenings very easily while watching television or movies.  His wife has noticed abnormal sleepiness on the weekends during the day with inactivity.  The patient also notes mild slight pressure with driving longer distances.  He states his weight has increased very little over the last 2 years, and his sleepiness score today is 10.  Sleep Questionnaire: What time do you typically go to bed?( Between what hours) 9-10 pm How long does it take you to fall asleep? 1/2 to 1 hr How many times during the night do you wake up? 8 What time do you get out of bed to start your day? 0600 Do you drive or operate heavy machinery in your occupation? Yes How much has your weight changed (up or down) over the past two years? (In pounds) 20 lb (9.072 kg) Have you ever had a sleep study before? No Do you currently use CPAP? No Do you wear oxygen at any time? No    Review of Systems  Constitutional: Negative for fever and unexpected weight change.  HENT: Positive for sore throat and sneezing. Negative for ear pain, nosebleeds, congestion, rhinorrhea, trouble swallowing, dental problem, postnasal drip and sinus pressure.   Eyes: Negative for redness and itching.  Respiratory: Positive for cough and shortness of breath. Negative for chest tightness and wheezing.   Cardiovascular: Positive for chest pain. Negative for palpitations and leg swelling.    Gastrointestinal: Positive for abdominal pain. Negative for nausea and vomiting.  Genitourinary: Negative for dysuria.  Musculoskeletal: Positive for joint swelling.  Skin: Negative for rash.  Neurological: Positive for headaches.  Hematological: Does not bruise/bleed easily.  Psychiatric/Behavioral: Negative for dysphoric mood. The patient is not nervous/anxious.        Objective:   Physical Exam Constitutional:  Overweight male, no acute distress  HENT:  Nares patent without discharge, deviated septum to right with narrowing.  Oropharynx without exudate, palate and uvula are thick and elongated.   Eyes:  Perrla, eomi, no scleral icterus  Neck:  No JVD, no TMG  Cardiovascular:  Normal rate, regular rhythm, no rubs or gallops.  No murmurs        Intact distal pulses  Pulmonary :  Normal breath sounds, no stridor or respiratory distress   No rales, rhonchi, or wheezing  Abdominal:  Soft, nondistended, bowel sounds present.  No tenderness noted.   Musculoskeletal:  No lower extremity edema noted.  Lymph Nodes:  No cervical lymphadenopathy noted  Skin:  No cyanosis noted  Neurologic:  Alert, appropriate, moves all 4 extremities without obvious deficit.         Assessment & Plan:

## 2011-12-22 ENCOUNTER — Ambulatory Visit (HOSPITAL_BASED_OUTPATIENT_CLINIC_OR_DEPARTMENT_OTHER): Payer: No Typology Code available for payment source | Attending: Pulmonary Disease

## 2011-12-22 DIAGNOSIS — G4733 Obstructive sleep apnea (adult) (pediatric): Secondary | ICD-10-CM | POA: Insufficient documentation

## 2012-01-06 ENCOUNTER — Telehealth: Payer: Self-pay | Admitting: *Deleted

## 2012-01-06 NOTE — Telephone Encounter (Signed)
Patients spouse called to get the results of the sleep study.  Please advise.

## 2012-01-06 NOTE — Telephone Encounter (Signed)
Patient's wife advised

## 2012-01-06 NOTE — Telephone Encounter (Signed)
Pt should call pulmonary office where study done so he can get pulmonary recommendations as well. If he has trouble getting results from then he can call back and we can try to locate them for him.

## 2012-01-09 ENCOUNTER — Telehealth: Payer: Self-pay | Admitting: Pulmonary Disease

## 2012-01-09 NOTE — Telephone Encounter (Signed)
ATC NA WCB unable to leave vm 

## 2012-01-10 ENCOUNTER — Telehealth: Payer: Self-pay | Admitting: Pulmonary Disease

## 2012-01-10 NOTE — Telephone Encounter (Signed)
LMTCB

## 2012-01-10 NOTE — Telephone Encounter (Signed)
Pt's wife is calling for sleep study results. Requests to be called back after 5:00 pm today Will forward to [facility] for work in appt next week per Dr. Shelle Iron, [facility] please call patient after 5pm

## 2012-01-10 NOTE — Telephone Encounter (Signed)
Go ahead and schedule him for followup visit next week to review study with him.

## 2012-01-10 NOTE — Telephone Encounter (Signed)
Pt is asking for sleep study results that was done on 12-23-11. Please advise. Carron Curie, CMA

## 2012-01-10 NOTE — Telephone Encounter (Signed)
Please disregard this phone note and see documentation on 3/21 phone note, i will add this call info to that phone note and sign off on this one

## 2012-01-10 NOTE — Telephone Encounter (Signed)
Called and spoke with pt. Pt scheduled to see [employer] on Thurs 3/28 at 8:45 am

## 2012-01-11 DIAGNOSIS — G4733 Obstructive sleep apnea (adult) (pediatric): Secondary | ICD-10-CM

## 2012-01-11 NOTE — Procedures (Signed)
NAME:  Cody Day, Cody Day NO.:  [id number]  MEDICAL RECORD NO.:  [id number]          PATIENT TYPE:  OUT  LOCATION:  SLEEP CENTER                 FACILITY:  [facility]  PHYSICIAN:  Barbaraann Share, MD,FCCPDATE OF BIRTH:  January 12, 1956  DATE OF STUDY:  12/22/2011                           NOCTURNAL POLYSOMNOGRAM  REFERRING PHYSICIAN:  Barbaraann Share, MD,FCCP  INDICATION FOR STUDY:  Hypersomnia with sleep apnea.  EPWORTH SLEEPINESS SCORE:  8.  MEDICATIONS:  SLEEP ARCHITECTURE:  The patient has a total sleep time of 317 minutes with no slow-wave sleep and only 49 minutes of REM.  Sleep onset latency is normal at 15 minutes and REM onset was delayed at 166 minutes.  Sleep efficiency is mildly reduced at 88%.  RESPIRATORY DATA:  The patient was found to have 36 apneas and 380 obstructive hypopneas, giving him an apnea-hypopnea index of 79 events per hour.  The events occur in all body positions and there was loud snoring noted throughout.  OXYGEN DATA:  There was O2 desaturation as low as 65% with the patient's obstructive events.  CARDIAC DATA:  Rare PVC noted, but no clinically significant arrhythmias were seen.  MOVEMENT-PARASOMNIA:  The patient had no significant leg jerks or other abnormal behaviors noted.  IMPRESSIONS-RECOMMENDATIONS: 1. Severe obstructive sleep apnea/hypopnea syndrome with an AHI of 79     events per hour, and O2 desaturation as low as 65%.  Treatment for     this degree of sleep apnea should focus primarily on CPAP, as well     as aggressive weight loss. 2. Rare PVC noted, but no clinically significant arrhythmias were     seen.     Barbaraann Share, MD,FCCP Diplomate, American Board of Sleep Medicine   [facility]/MEDQ  D:  01/11/2012 17:55:37  T:  01/11/2012 18:51:10  Job:  [id number]

## 2012-01-13 ENCOUNTER — Other Ambulatory Visit: Payer: Self-pay | Admitting: *Deleted

## 2012-01-13 MED ORDER — SIMVASTATIN 40 MG PO TABS: 40.0000 mg | ORAL_TABLET | ORAL | Status: DC

## 2012-01-13 NOTE — Telephone Encounter (Signed)
Received faxed refill request from pharmacy for Advir Diskus 250/68mcg/inh #60, use as inhaler twice a day. Detailed instructions enclosed. Previous refill request shows Dr. Almetta Lovely, [place] [facility], written 08/02/10 with 0 refills. Is it okay to refill medication?

## 2012-01-14 MED ORDER — FLUTICASONE-SALMETEROL 250-50 MCG/DOSE IN AEPB: 1.0000 | INHALATION_SPRAY | Freq: Two times a day (BID) | RESPIRATORY_TRACT | Status: DC | PRN

## 2012-01-14 NOTE — Telephone Encounter (Signed)
Okay to fill? 

## 2012-01-20 ENCOUNTER — Ambulatory Visit (INDEPENDENT_AMBULATORY_CARE_PROVIDER_SITE_OTHER): Payer: No Typology Code available for payment source | Admitting: Pulmonary Disease

## 2012-01-20 ENCOUNTER — Encounter: Payer: Self-pay | Admitting: Pulmonary Disease

## 2012-01-20 VITALS — BP 138/80 | HR 99 | Temp 97.9°F | Ht 71.0 in | Wt 244.6 lb

## 2012-01-20 DIAGNOSIS — G4733 Obstructive sleep apnea (adult) (pediatric): Secondary | ICD-10-CM

## 2012-01-20 NOTE — Progress Notes (Signed)
  Subjective:    Patient ID: Cody Day, male    DOB: 21-Apr-1956, 56 y.o.   MRN: [id number]  HPI The patient comes in today for followup of his recent sleep study.  He was found to have severe obstructive sleep apnea with an AHI of 79 events per hour and significant oxygen desaturation.  I have reviewed this study with him in detail, and answered all of his questions.   Review of Systems  Constitutional: Negative.  Negative for fever and unexpected weight change.  HENT: Negative.  Negative for ear pain, nosebleeds, congestion, sore throat, rhinorrhea, sneezing, trouble swallowing, dental problem, postnasal drip and sinus pressure.   Eyes: Negative.  Negative for redness and itching.  Respiratory: Positive for cough, shortness of breath and wheezing. Negative for chest tightness.   Cardiovascular: Negative.  Negative for palpitations and leg swelling.  Gastrointestinal: Negative.  Negative for nausea and vomiting.  Genitourinary: Negative.  Negative for dysuria.  Musculoskeletal: Negative.  Negative for joint swelling.  Skin: Negative.  Negative for rash.  Neurological: Negative.  Negative for headaches.  Hematological: Negative.  Does not bruise/bleed easily.  Psychiatric/Behavioral: Negative.  Negative for dysphoric mood. The patient is not nervous/anxious.        Objective:   Physical Exam Overweight male in no acute distress Nose without purulence or discharge noted Lower extremities with no significant edema, no cyanosis Alert and oriented, moves all 4 extremities.        Assessment & Plan:

## 2012-01-20 NOTE — Assessment & Plan Note (Signed)
The patient has severe obstructive sleep apnea by his recent sleep study, and will need to be started on CPAP while working on weight reduction.  I will set the patient up on cpap at a moderate pressure level to allow for desensitization, and will troubleshoot the device over the next 4-6weeks if needed.  The pt is to call me if having issues with tolerance.  Will then optimize the pressure once patient is able to wear cpap on a consistent basis.

## 2012-01-20 NOTE — Patient Instructions (Signed)
Will start on cpap at moderate pressure level.  Please call if issues with tolerance. Work on weight loss followup with me in 5-6 weeks.

## 2012-01-23 ENCOUNTER — Other Ambulatory Visit: Payer: Self-pay

## 2012-01-23 MED ORDER — LISINOPRIL-HYDROCHLOROTHIAZIDE 20-12.5 MG PO TABS: 1.0000 | ORAL_TABLET | Freq: Every day | ORAL | Status: DC

## 2012-01-23 NOTE — Telephone Encounter (Signed)
Cody Day faxed refill request  Lisinopril/HCTZ 20-12.5 mg. Pt last seen 08/23/11 and BP was 160/84. No future appt scheduled is it OK to refill?

## 2012-01-23 NOTE — Telephone Encounter (Signed)
Okay to refill for 6 months 

## 2012-01-29 ENCOUNTER — Telehealth: Payer: Self-pay | Admitting: Pulmonary Disease

## 2012-01-29 NOTE — Telephone Encounter (Signed)
Will forward to RA as an FYI 

## 2012-02-24 ENCOUNTER — Encounter: Payer: Self-pay | Admitting: Pulmonary Disease

## 2012-02-24 ENCOUNTER — Ambulatory Visit (INDEPENDENT_AMBULATORY_CARE_PROVIDER_SITE_OTHER): Payer: No Typology Code available for payment source | Admitting: Pulmonary Disease

## 2012-02-24 VITALS — BP 132/78 | HR 94 | Temp 98.2°F | Ht 71.0 in | Wt 247.6 lb

## 2012-02-24 DIAGNOSIS — G4733 Obstructive sleep apnea (adult) (pediatric): Secondary | ICD-10-CM

## 2012-02-24 NOTE — Progress Notes (Signed)
  Subjective:    Patient ID: Cody Day, male    DOB: 10/15/1956, 56 y.o.   MRN: [id number]  HPI The patient comes in today for followup of his known sleep apnea.  He is wearing CPAP compliantly, and has seen an improvement in his sleep and daytime alertness.  He is having no significant issues with his mask, but is having an issue with getting his humidity adjusted properly. I have looked at his download today, and it shows excellent compliance, but a slightly increased mask leak.   Review of Systems  Constitutional: Negative for fever and unexpected weight change.  HENT: Positive for rhinorrhea. Negative for ear pain, nosebleeds, congestion, sore throat, sneezing, trouble swallowing, dental problem, postnasal drip and sinus pressure.   Eyes: Negative for redness and itching.  Respiratory: Positive for cough. Negative for chest tightness, shortness of breath and wheezing.   Cardiovascular: Negative for palpitations and leg swelling.  Gastrointestinal: Negative for nausea and vomiting.  Genitourinary: Negative for dysuria.  Musculoskeletal: Negative for joint swelling.  Skin: Negative for rash.  Neurological: Negative for headaches.  Hematological: Does not bruise/bleed easily.  Psychiatric/Behavioral: Negative for dysphoric mood. The patient is not nervous/anxious.        Objective:   Physical Exam Obese male in no acute distress No skin breakdown or pressure necrosis from the CPAP mask Chest clear to auscultation Cardiac exam was regular rate and rhythm Lower extremities without edema, cyanosis Alert and oriented, moves all 4 extremities.       Assessment & Plan:

## 2012-02-24 NOTE — Assessment & Plan Note (Signed)
The patient has been wearing CPAP compliantly by his download, and feels that his sleep and daytime alertness have greatly improved.  He is having some issues with humidity, but is making adjustments.  At this point, we need to optimize his pressure for him, and will do this on the auto setting.  He will also have his DME show him how to adjust the humidification system. Care Plan:  At this point, will arrange for the patient's machine to be changed over to auto mode for 2 weeks to optimize their pressure.  I will review the downloaded data once sent by dme, and also evaluate for compliance, leaks, and residual osa.  I will call the patient and dme to discuss the results, and have the patient's machine set appropriately.  This will serve as the pt's cpap pressure titration.

## 2012-02-24 NOTE — Patient Instructions (Signed)
Will have your machine put on auto for next 2 weeks so that we can optimize your pressure.  Will let you know the results. Work on weight loss followup with me in 6mos.

## 2012-04-04 ENCOUNTER — Other Ambulatory Visit: Payer: Self-pay | Admitting: Pulmonary Disease

## 2012-04-04 DIAGNOSIS — G4733 Obstructive sleep apnea (adult) (pediatric): Secondary | ICD-10-CM

## 2012-08-20 ENCOUNTER — Other Ambulatory Visit: Payer: Self-pay | Admitting: *Deleted

## 2012-08-20 MED ORDER — LISINOPRIL-HYDROCHLOROTHIAZIDE 20-12.5 MG PO TABS: 1.0000 | ORAL_TABLET | Freq: Every day | ORAL | Status: DC

## 2012-08-26 ENCOUNTER — Telehealth: Payer: Self-pay | Admitting: Pulmonary Disease

## 2012-08-26 NOTE — Telephone Encounter (Signed)
Spoke with wife-understands to have pt call DME company to see if they would like him to take machine or just card to them to get a report for [facility] prior to 09/11/12 appt; this will show compliance and pressure report for [facility] to review with patient.

## 2012-09-01 ENCOUNTER — Other Ambulatory Visit: Payer: Self-pay

## 2012-09-01 MED ORDER — LISINOPRIL-HYDROCHLOROTHIAZIDE 20-12.5 MG PO TABS: 1.0000 | ORAL_TABLET | Freq: Every day | ORAL | Status: DC

## 2012-09-01 MED ORDER — ALLOPURINOL 100 MG PO TABS: 100.0000 mg | ORAL_TABLET | Freq: Every day | ORAL | Status: DC

## 2012-09-01 MED ORDER — SIMVASTATIN 40 MG PO TABS: 40.0000 mg | ORAL_TABLET | ORAL | Status: DC

## 2012-09-01 NOTE — Telephone Encounter (Signed)
[facility] to refill until CPX.

## 2012-09-01 NOTE — Telephone Encounter (Signed)
pts wife request refill on simvastatin,allopurinol and lisinopril-HCTZ to [facility]; pt has CPX 12/01/12 and last seen 08/23/11.Please advise.

## 2012-09-11 ENCOUNTER — Ambulatory Visit (INDEPENDENT_AMBULATORY_CARE_PROVIDER_SITE_OTHER): Payer: No Typology Code available for payment source | Admitting: Pulmonary Disease

## 2012-09-11 ENCOUNTER — Encounter: Payer: Self-pay | Admitting: Pulmonary Disease

## 2012-09-11 VITALS — BP 144/90 | HR 92 | Temp 98.7°F | Ht 71.0 in | Wt 252.2 lb

## 2012-09-11 DIAGNOSIS — G4733 Obstructive sleep apnea (adult) (pediatric): Secondary | ICD-10-CM

## 2012-09-11 NOTE — Patient Instructions (Addendum)
Will get you a hose with heating element.  This should have come with your machine. Will get you to the sleep center for a mask fitting.  Please take your current mask with you to the visit. Work on weight loss If you get a well fitting mask, but feel you are still having issues with the pressure tolerance, please let me know.  If doing well, followup with me in 6mos

## 2012-09-11 NOTE — Assessment & Plan Note (Signed)
The patient is doing very well with CPAP according to his download with respect to compliance and control of his AHI.  However, he is having difficulties with mask tolerance, and the leaking is leading to issues with his pressure.  I would like to try and get him a new mask that fits better, and we'll also work with him on his humidity issues.  Finally, I encouraged him to work aggressively on weight loss.

## 2012-09-11 NOTE — Progress Notes (Signed)
  Subjective:    Patient ID: Cody Day, male    DOB: 07/06/1956, 56 y.o.   MRN: [id number]  HPI The patient comes in today for followup of his known obstructive sleep apnea.  He is wearing CPAP compliantly by his download, and has excellent control of his apnea hypotony index on 14 cm of water pressure.  He is having issues with mask tolerance because of leaks, however his download data shows that his legs are in an acceptable range.  However, it is not comfortable for him.  He is also having issues with humidity, and unfortunately was not given the heated hose with his new device.   Review of Systems  Constitutional: Negative for fever and unexpected weight change.  HENT: Positive for congestion, rhinorrhea and sinus pressure. Negative for ear pain, nosebleeds, sore throat, sneezing, trouble swallowing, dental problem and postnasal drip.   Eyes: Positive for redness and itching.  Respiratory: Positive for cough, shortness of breath and wheezing. Negative for chest tightness.   Cardiovascular: Positive for leg swelling. Negative for palpitations.  Gastrointestinal: Negative for nausea and vomiting.  Genitourinary: Negative for dysuria.  Musculoskeletal: Negative for joint swelling.  Skin: Negative for rash.  Neurological: Positive for headaches.  Hematological: Does not bruise/bleed easily.  Psychiatric/Behavioral: Negative for dysphoric mood. The patient is not nervous/anxious.        Objective:   Physical Exam Overweight male in no acute distress Nose without purulence or discharge noted No skin breakdown or pressure necrosis from the CPAP mask Lower extremities with no significant edema, no cyanosis Alert and oriented, moves all 4 extremities.       Assessment & Plan:

## 2012-09-14 ENCOUNTER — Telehealth: Payer: Self-pay | Admitting: Pulmonary Disease

## 2012-09-16 NOTE — Telephone Encounter (Signed)
precert was not done in march 2013 so we filed an appeal and the appeal was denied left message for Gavin Pound was to what happened Tobe Sos

## 2012-10-12 ENCOUNTER — Ambulatory Visit (HOSPITAL_BASED_OUTPATIENT_CLINIC_OR_DEPARTMENT_OTHER): Payer: No Typology Code available for payment source | Attending: Pulmonary Disease

## 2012-10-12 DIAGNOSIS — G4733 Obstructive sleep apnea (adult) (pediatric): Secondary | ICD-10-CM

## 2012-10-22 ENCOUNTER — Telehealth: Payer: Self-pay

## 2012-10-22 NOTE — Telephone Encounter (Signed)
Agreed -

## 2012-10-22 NOTE — Telephone Encounter (Signed)
pts wife said pt having SOB for 1 week since weather change; pt has COPD; pt using Advair and albuterol inhaler help somewhat, not lasting effect from inhalers. Pt does not want to go to ED for eval and request cortisone. Advised would need appt to be evaluated; first available is 10/23/12 at 11 am. If pt condition changes or worsens should go to UC or ED. pts wife voiced understanding.

## 2012-10-23 ENCOUNTER — Ambulatory Visit (INDEPENDENT_AMBULATORY_CARE_PROVIDER_SITE_OTHER): Payer: No Typology Code available for payment source | Admitting: Family Medicine

## 2012-10-23 ENCOUNTER — Encounter: Payer: Self-pay | Admitting: Family Medicine

## 2012-10-23 ENCOUNTER — Observation Stay: Payer: Self-pay | Admitting: Internal Medicine

## 2012-10-23 VITALS — BP 120/80 | HR 103 | Temp 98.4°F | Resp 24 | Ht 71.0 in | Wt 248.5 lb

## 2012-10-23 DIAGNOSIS — I2510 Atherosclerotic heart disease of native coronary artery without angina pectoris: Secondary | ICD-10-CM

## 2012-10-23 DIAGNOSIS — R0602 Shortness of breath: Secondary | ICD-10-CM

## 2012-10-23 DIAGNOSIS — R000 Tachycardia, unspecified: Secondary | ICD-10-CM

## 2012-10-23 DIAGNOSIS — J449 Chronic obstructive pulmonary disease, unspecified: Secondary | ICD-10-CM

## 2012-10-23 LAB — CBC
HCT: 41.2 % (ref 40.0–52.0)
HGB: 13.5 g/dL (ref 13.0–18.0)
MCH: 23.4 pg — ABNORMAL LOW (ref 26.0–34.0)
MCHC: 32.7 g/dL (ref 32.0–36.0)
MCV: 72 fL — ABNORMAL LOW (ref 80–100)
Platelet: 257 10*3/uL (ref 150–440)
RBC: 5.75 10*6/uL (ref 4.40–5.90)
RDW: 17.2 % — ABNORMAL HIGH (ref 11.5–14.5)
WBC: 6.8 10*3/uL (ref 3.8–10.6)

## 2012-10-23 LAB — COMPREHENSIVE METABOLIC PANEL
Albumin: 4.1 g/dL (ref 3.4–5.0)
Alkaline Phosphatase: 77 U/L (ref 50–136)
Anion Gap: 14 (ref 7–16)
BUN: 13 mg/dL (ref 7–18)
Bilirubin,Total: 0.9 mg/dL (ref 0.2–1.0)
Calcium, Total: 9.3 mg/dL (ref 8.5–10.1)
Chloride: 99 mmol/L (ref 98–107)
Co2: 23 mmol/L (ref 21–32)
Creatinine: 1.07 mg/dL (ref 0.60–1.30)
EGFR (African American): >60
EGFR (Non-African Amer.): >60
Glucose: 112 mg/dL — ABNORMAL HIGH (ref 65–99)
Osmolality: 273 (ref 275–301)
Potassium: 3.4 mmol/L — ABNORMAL LOW (ref 3.5–5.1)
SGOT(AST): 45 U/L — ABNORMAL HIGH (ref 15–37)
SGPT (ALT): 49 U/L (ref 12–78)
Sodium: 136 mmol/L (ref 136–145)
Total Protein: 7.6 g/dL (ref 6.4–8.2)

## 2012-10-23 LAB — PRO B NATRIURETIC PEPTIDE: B-Type Natriuretic Peptide: 43 pg/mL (ref 0–125)

## 2012-10-23 LAB — TSH: Thyroid Stimulating Horm: 2.58 u[IU]/mL

## 2012-10-23 LAB — MAGNESIUM: Magnesium: 1.8 mg/dL

## 2012-10-23 LAB — TROPONIN I: Troponin-I: <0.02 ng/mL

## 2012-10-23 NOTE — Progress Notes (Signed)
  Subjective:    Patient ID: Cody Day, male    DOB: Jul 18, 1956, 57 y.o.   MRN: [id number]  Shortness of Breath This is a new problem. The current episode started in the past 7 days (stareted in last few weeks, worse in last week). The problem has been gradually worsening. Associated symptoms include chest pain and leg swelling. Pertinent negatives include no abdominal pain, coryza, fever, rash, sputum production or vomiting. The symptoms are aggravated by any activity and exercise. Associated symptoms comments: No cough, mild congestion in AM when taking off CPAP, but otherwose none  Chest feels heavy.. The patient has no known risk factors for DVT/PE. He has tried beta agonist inhalers (Using advair and ventolin helps, last few hours) for the symptoms. The treatment provided mild relief. His past medical history is significant for CAD, chronic lung disease and COPD. There is no history of asthma, PE or pneumonia. (Hx of methamphetamine)   Some ache in left leg.Lambert Mody pain earlier this week now ache in left leg. Ache in calf as well, but been there a whil.. Does have varicose veins.   Review of Systems  Constitutional: Positive for fatigue. Negative for fever and chills.  Respiratory: Positive for shortness of breath. Negative for sputum production.   Cardiovascular: Positive for chest pain, palpitations and leg swelling.       Occ irregular heart rate  Gastrointestinal: Negative for nausea, vomiting and abdominal pain.  Genitourinary: Negative for dysuria.  Skin: Negative for rash.       Objective:   Physical Exam  Constitutional: Vital signs are normal. He appears well-developed and well-nourished. He appears ill.  HENT:  Head: Normocephalic.  Right Ear: Hearing normal.  Left Ear: Hearing normal.  Nose: Nose normal.  Mouth/Throat: Oropharynx is clear and moist and mucous membranes are normal.  Neck: Trachea normal. Carotid bruit is not present. No mass and no thyromegaly present.    Cardiovascular: Normal rate, regular rhythm and normal pulses.  Exam reveals no gallop, no distant heart sounds and no friction rub.   No murmur heard.      No peripheral edema  Pulmonary/Chest: Breath sounds normal. Tachypnea noted. No respiratory distress. He has no decreased breath sounds. He has no wheezes. He has no rhonchi. He has no rales.  Neurological: He is alert.  Skin: Skin is warm, dry and intact. No rash noted.  Psychiatric: He has a normal mood and affect. His speech is normal and behavior is normal. Thought content normal.          Assessment & Plan:

## 2012-10-23 NOTE — Patient Instructions (Signed)
Go directly to [facility] ER for further eval.

## 2012-10-23 NOTE — Assessment & Plan Note (Addendum)
Concerned about PE or possible MI . Needs emergent eval with CXR, CT chest, CE rule out. No clear COPD exacerbation.   Pt has elected to have friend drive him to [facility] for further eval as opposed to EMS transport.  No hypoxia.

## 2012-11-19 ENCOUNTER — Telehealth: Payer: Self-pay | Admitting: Family Medicine

## 2012-11-19 DIAGNOSIS — R7401 Elevation of levels of liver transaminase levels: Secondary | ICD-10-CM

## 2012-11-19 DIAGNOSIS — I10 Essential (primary) hypertension: Secondary | ICD-10-CM

## 2012-11-19 DIAGNOSIS — Z125 Encounter for screening for malignant neoplasm of prostate: Secondary | ICD-10-CM

## 2012-11-19 DIAGNOSIS — M109 Gout, unspecified: Secondary | ICD-10-CM

## 2012-11-19 DIAGNOSIS — R7309 Other abnormal glucose: Secondary | ICD-10-CM

## 2012-11-19 DIAGNOSIS — E785 Hyperlipidemia, unspecified: Secondary | ICD-10-CM

## 2012-11-19 NOTE — Telephone Encounter (Signed)
Message copied by Excell Seltzer on Thu Nov 19, 2012  2:13 PM ------      Message from: Baldomero Lamy      Created: Fri Nov 13, 2012  1:28 PM      Regarding: Cpx labs Mon 11/24/12       Please order  future cpx labs for pt's upcoming lab appt.      Thanks      Rodney Booze

## 2012-11-24 ENCOUNTER — Other Ambulatory Visit (INDEPENDENT_AMBULATORY_CARE_PROVIDER_SITE_OTHER): Payer: No Typology Code available for payment source

## 2012-11-24 DIAGNOSIS — I2510 Atherosclerotic heart disease of native coronary artery without angina pectoris: Secondary | ICD-10-CM

## 2012-11-24 DIAGNOSIS — M109 Gout, unspecified: Secondary | ICD-10-CM

## 2012-11-24 DIAGNOSIS — E785 Hyperlipidemia, unspecified: Secondary | ICD-10-CM

## 2012-11-24 DIAGNOSIS — I10 Essential (primary) hypertension: Secondary | ICD-10-CM

## 2012-11-24 DIAGNOSIS — J449 Chronic obstructive pulmonary disease, unspecified: Secondary | ICD-10-CM

## 2012-11-24 DIAGNOSIS — R7309 Other abnormal glucose: Secondary | ICD-10-CM

## 2012-11-24 DIAGNOSIS — R000 Tachycardia, unspecified: Secondary | ICD-10-CM

## 2012-11-24 DIAGNOSIS — R0602 Shortness of breath: Secondary | ICD-10-CM

## 2012-11-24 DIAGNOSIS — Z125 Encounter for screening for malignant neoplasm of prostate: Secondary | ICD-10-CM

## 2012-11-24 DIAGNOSIS — G4733 Obstructive sleep apnea (adult) (pediatric): Secondary | ICD-10-CM

## 2012-11-24 LAB — COMPREHENSIVE METABOLIC PANEL
ALT: 33 U/L (ref 0–53)
AST: 31 U/L (ref 0–37)
Albumin: 3.9 g/dL (ref 3.5–5.2)
Alkaline Phosphatase: 56 U/L (ref 39–117)
BUN: 15 mg/dL (ref 6–23)
CO2: 29 mEq/L (ref 19–32)
Calcium: 9 mg/dL (ref 8.4–10.5)
Chloride: 98 mEq/L (ref 96–112)
Creatinine, Ser: 0.9 mg/dL (ref 0.4–1.5)
GFR: 95.2 mL/min (ref >60.00)
Glucose, Bld: 114 mg/dL — ABNORMAL HIGH (ref 70–99)
Potassium: 4.4 mEq/L (ref 3.5–5.1)
Sodium: 136 mEq/L (ref 135–145)
Total Bilirubin: 1.2 mg/dL (ref 0.3–1.2)
Total Protein: 6.8 g/dL (ref 6.0–8.3)

## 2012-11-24 LAB — LIPID PANEL
Cholesterol: 188 mg/dL (ref 0–200)
HDL: 57.3 mg/dL (ref >39.00)
LDL Cholesterol: 96 mg/dL (ref 0–99)
Total CHOL/HDL Ratio: 3
Triglycerides: 174 mg/dL — ABNORMAL HIGH (ref 0.0–149.0)
VLDL: 34.8 mg/dL (ref 0.0–40.0)

## 2012-11-24 LAB — URIC ACID: Uric Acid, Serum: 6.1 mg/dL (ref 4.0–7.8)

## 2012-11-24 LAB — PSA: PSA: 0.34 ng/mL (ref 0.10–4.00)

## 2012-12-01 ENCOUNTER — Ambulatory Visit (INDEPENDENT_AMBULATORY_CARE_PROVIDER_SITE_OTHER): Payer: No Typology Code available for payment source | Admitting: Family Medicine

## 2012-12-01 ENCOUNTER — Encounter: Payer: Self-pay | Admitting: Family Medicine

## 2012-12-01 VITALS — BP 130/72 | HR 89 | Temp 98.6°F | Ht 71.0 in | Wt 246.5 lb

## 2012-12-01 DIAGNOSIS — F411 Generalized anxiety disorder: Secondary | ICD-10-CM | POA: Insufficient documentation

## 2012-12-01 DIAGNOSIS — M109 Gout, unspecified: Secondary | ICD-10-CM

## 2012-12-01 DIAGNOSIS — J449 Chronic obstructive pulmonary disease, unspecified: Secondary | ICD-10-CM

## 2012-12-01 DIAGNOSIS — E785 Hyperlipidemia, unspecified: Secondary | ICD-10-CM

## 2012-12-01 DIAGNOSIS — R7309 Other abnormal glucose: Secondary | ICD-10-CM

## 2012-12-01 DIAGNOSIS — I10 Essential (primary) hypertension: Secondary | ICD-10-CM

## 2012-12-01 DIAGNOSIS — Z0000 Encounter for general adult medical examination without abnormal findings: Secondary | ICD-10-CM

## 2012-12-01 MED ORDER — LISINOPRIL-HYDROCHLOROTHIAZIDE 20-12.5 MG PO TABS: 1.0000 | ORAL_TABLET | Freq: Every day | ORAL | Status: DC

## 2012-12-01 MED ORDER — ALLOPURINOL 100 MG PO TABS: 100.0000 mg | ORAL_TABLET | Freq: Every day | ORAL | Status: DC

## 2012-12-01 MED ORDER — SIMVASTATIN 40 MG PO TABS: 20.0000 mg | ORAL_TABLET | Freq: Every day | ORAL | Status: DC

## 2012-12-01 NOTE — Patient Instructions (Addendum)
Make sure to have CPAP adjusted. Continue citalopram and alprazolam prn. Work on low [employer], stop hard candies. Work on regular exercise. Decrease cheese and high cholesterol foods, butter.. Call [facility] to set up colonoscopy as soon as you are able. Follow up in 3-4 month on mood.

## 2012-12-01 NOTE — Assessment & Plan Note (Signed)
Stable on allopurinol. No flares.

## 2012-12-01 NOTE — Assessment & Plan Note (Signed)
Well controlled on citalopram 20 and alprazolam prn. WIll continue, pt will try to limit alprazolam when able.

## 2012-12-01 NOTE — Progress Notes (Signed)
Subjective:    Patient ID: Cody Day, male    DOB: 1956-04-07, 57 y.o.   MRN: [id number]  HPI  57 year old male presents for wellness  He was hospitalized in last month for respiratory alakalosis felt to be secondary to  Anxiety and aspirin intake. Has seen Dr. Penelope Galas and Dr. Luther Redo Nephrologist Reviewed notes. They started citalopram 20 mg  for anxiety and alprazolam 0.25 mg BID. Has breathing test pending. Plans to take CPAP for adjustment. He plans to return to see Dr. Shelle Iron for further treatment instead of returning to see Dr. Welton Flakes.  Recent labs show hg 13 Vit D low at 16.2.. Now on vit D per dr. Wynelle Link Cr 0.86 Glucose nonfasting 122  Hypertension:  Well controlledd on lisinopril/HCTZ  Using medication without problems or lightheadedness: None Chest pain with exertion:None Edema:None Short of breath:Improved Average home BPs:not checking. Other issues:  Elevated Cholesterol: Almost at goal <70 on zocor. Lab Results  Component Value Date   CHOL 188 11/24/2012   HDL 57.30 11/24/2012   LDLCALC 96 11/24/2012   LDLDIRECT 127.4 06/23/2009   TRIG 174.0* 11/24/2012   CHOLHDL 3 11/24/2012  Using medications without problems:none Muscle aches: None Diet compliance:Good Exercise:None Other complaints:  COPD:  Recently started on on symbicort and tudorza. Has not had to use albuterol since. He is breathing much better. Has history of 10 years of smoking meth amphetamine. No current drug of tobacco use.   Prediabetes: has been eating a lot of hard candy.  HAs lost 6 lbs. Wt Readings from Last 3 Encounters:  12/01/12 246 lb 8 oz (111.812 kg)  10/23/12 248 lb 8 oz (112.719 kg)  09/11/12 252 lb 3.2 oz (114.397 kg)    Gout: no recent flares.  Uric acid 6.1     Review of Systems  Constitutional: Negative for fever, fatigue and unexpected weight change.  HENT: Negative for ear pain, congestion, sore throat, rhinorrhea, trouble swallowing and postnasal drip.   Eyes: Negative  for pain.  Respiratory: Negative for cough, shortness of breath and wheezing.   Cardiovascular: Negative for chest pain, palpitations and leg swelling.  Gastrointestinal: Negative for nausea, abdominal pain, diarrhea, constipation and blood in stool.  Genitourinary: Negative for dysuria, urgency, hematuria, discharge, penile swelling, scrotal swelling, difficulty urinating, penile pain and testicular pain.  Skin: Negative for rash.  Neurological: Negative for syncope, weakness, light-headedness, numbness and headaches.  Psychiatric/Behavioral: Negative for behavioral problems and dysphoric mood. The patient is not nervous/anxious.        Objective:   Physical Exam  Constitutional: He appears well-developed and well-nourished.  Non-toxic appearance. He does not appear ill. No distress.  HENT:  Head: Normocephalic and atraumatic.  Right Ear: Hearing, tympanic membrane, external ear and ear canal normal.  Left Ear: Hearing, tympanic membrane, external ear and ear canal normal.  Nose: Nose normal.  Mouth/Throat: Uvula is midline, oropharynx is clear and moist and mucous membranes are normal.  Eyes: Conjunctivae, EOM and lids are normal. Pupils are equal, round, and reactive to light. No foreign bodies found.  Neck: Trachea normal, normal range of motion and phonation normal. Neck supple. Carotid bruit is not present. No mass and no thyromegaly present.  Cardiovascular: Normal rate, regular rhythm, S1 normal, S2 normal, intact distal pulses and normal pulses.  Exam reveals no gallop.   No murmur heard. Pulmonary/Chest: Breath sounds normal. He has no wheezes. He has no rhonchi. He has no rales.  Abdominal: Soft. Normal appearance and bowel sounds  are normal. There is no hepatosplenomegaly. There is no tenderness. There is no rebound, no guarding and no CVA tenderness. No hernia.  Lymphadenopathy:    He has no cervical adenopathy.  Neurological: He is alert. He has normal strength and normal  reflexes. No cranial nerve deficit or sensory deficit. Gait normal.  Skin: Skin is warm, dry and intact. No rash noted.  Psychiatric: He has a normal mood and affect. His speech is normal and behavior is normal. Judgment and thought content normal. Cognition and memory are normal.          Assessment & Plan:  The patient's preventative maintenance and recommended screening tests for an annual wellness exam were reviewed in full today. Brought up to date unless services declined.  Counselled on the importance of diet, exercise, and its role in overall health and mortality. The patient's FH and SH was reviewed, including their home life, tobacco status, and drug and alcohol status.   Colon: 03/2010, polyps .Marland Kitchen Recommend repeat in 2 years.[facility]land Kitchenat [facility].  Vaccine: Td 2011, flu: given, PNA given as well.  PSA: Sees Dr. Achilles Dunk.. Had nml prostate exam in 09/2012 Lab Results  Component Value Date   PSA 0.34 11/24/2012   PSA 0.69 08/19/2011   PSA 0.32 06/23/2009  Former smoker

## 2012-12-01 NOTE — Assessment & Plan Note (Signed)
Almost at goal. Work on lifestyle . Take 20 mg statin daily... SE at higher dose.

## 2012-12-01 NOTE — Assessment & Plan Note (Signed)
Per pulm 

## 2012-12-01 NOTE — Assessment & Plan Note (Signed)
Well controlled. Continue current medication.  

## 2012-12-01 NOTE — Assessment & Plan Note (Signed)
Encouraged exercise, weight loss, healthy eating habits. ? ?

## 2012-12-02 LAB — PULMONARY FUNCTION TEST

## 2013-01-12 ENCOUNTER — Other Ambulatory Visit: Payer: Self-pay

## 2013-01-12 MED ORDER — SIMVASTATIN 20 MG PO TABS: 20.0000 mg | ORAL_TABLET | Freq: Every day | ORAL | Status: DC

## 2013-01-12 MED ORDER — ACLIDINIUM BROMIDE 400 MCG/ACT IN AEPB: 1.0000 | INHALATION_SPRAY | Freq: Every day | RESPIRATORY_TRACT | Status: DC

## 2013-01-12 MED ORDER — BUDESONIDE-FORMOTEROL FUMARATE 160-4.5 MCG/ACT IN AERO: 2.0000 | INHALATION_SPRAY | Freq: Two times a day (BID) | RESPIRATORY_TRACT | Status: DC

## 2013-01-12 MED ORDER — ALPRAZOLAM 0.25 MG PO TABS: 0.2500 mg | ORAL_TABLET | Freq: Two times a day (BID) | ORAL | Status: DC | PRN

## 2013-01-12 NOTE — Telephone Encounter (Signed)
Pt seen on 12/01/12 and requested Simvastatin 40 mg changed to 20 mg taking one daily.Please advise. Pt also request refills on meds Dr Ermalene Searing has not prescribed before. Alprazolam 0.25 mg taking one tab po twice daily as needed for anxiety,Symbicort 160 mcg/4.5 mcg using 2 puffs into lungs twice a day  And Tudorza 400 mcg using one puff into lungs daily.Pt said Dr Welton Flakes had prescribed but advised for Alprazolam pt would need PCP to fill and the Symbicort and [employer] are samples.Please advise.Lubertha South.

## 2013-01-13 NOTE — Telephone Encounter (Signed)
rx called to pharmacy 

## 2013-01-14 ENCOUNTER — Encounter: Payer: Self-pay | Admitting: Family Medicine

## 2013-01-22 ENCOUNTER — Other Ambulatory Visit: Payer: Self-pay | Admitting: Family Medicine

## 2013-02-10 ENCOUNTER — Telehealth: Payer: Self-pay | Admitting: *Deleted

## 2013-02-10 NOTE — Telephone Encounter (Signed)
Medication list updated.

## 2013-03-11 ENCOUNTER — Encounter: Payer: Self-pay | Admitting: Pulmonary Disease

## 2013-03-11 ENCOUNTER — Ambulatory Visit (INDEPENDENT_AMBULATORY_CARE_PROVIDER_SITE_OTHER): Payer: No Typology Code available for payment source | Admitting: Pulmonary Disease

## 2013-03-11 VITALS — BP 154/80 | HR 87 | Temp 98.2°F | Ht 71.0 in | Wt 249.0 lb

## 2013-03-11 DIAGNOSIS — G4733 Obstructive sleep apnea (adult) (pediatric): Secondary | ICD-10-CM

## 2013-03-11 NOTE — Patient Instructions (Addendum)
Continue on cpap, and keep up with mask changes and supplies. Work on weight loss. followup with me in one year if doing well.  

## 2013-03-11 NOTE — Assessment & Plan Note (Signed)
The pt is doing well with cpap at his current pressure.  He is wearing compliantly with excellent control by download.  I have asked him to keep up with his mask changes and supplies, and to work aggressively on weight loss.  He will followup with me again in one year.

## 2013-03-11 NOTE — Progress Notes (Signed)
  Subjective:    Patient ID: Cody Day, male    DOB: 1956/04/17, 57 y.o.   MRN: [id number]  HPI Patient comes in today for followup of his extract of sleep apnea.  He has been wearing CPAP compliantly, and feels that he is doing well with the device.  He was recently in the hospital for what sounds like hyperventilation, and since, he has turned down the pressure on his machine to 12.4 cm.  He feels comfortable with this level, and his download shows adequate control of his sleep apnea.  He still has some air leak on his current mask by the download, but it is not excessive.  He feels that he sleeps well with the device, and has adequate daytime alertness.   Review of Systems  Constitutional: Negative for fever and unexpected weight change.  HENT: Positive for postnasal drip. Negative for ear pain, nosebleeds, congestion, sore throat, rhinorrhea, sneezing, trouble swallowing, dental problem and sinus pressure.   Eyes: Negative for redness and itching.  Respiratory: Positive for cough ( chest congestion in AM -- clear to gray in color) and shortness of breath. Negative for chest tightness and wheezing.   Cardiovascular: Negative for palpitations and leg swelling.  Gastrointestinal: Negative for nausea and vomiting.  Genitourinary: Negative for dysuria.  Musculoskeletal: Negative for joint swelling.  Skin: Negative for rash.  Neurological: Negative for headaches.  Hematological: Does not bruise/bleed easily.  Psychiatric/Behavioral: Negative for dysphoric mood. The patient is not nervous/anxious.        Objective:   Physical Exam Obese male in no acute distress Nose without purulent discharge noted No skin breakdown or pressure necrosis from the CPAP mask Neck without lymphadenopathy or thyromegaly Lower extremities with minimal edema, no cyanosis Alert, does not appear to be sleepy, moves all 4 extremities.       Assessment & Plan:

## 2013-03-12 ENCOUNTER — Ambulatory Visit: Payer: No Typology Code available for payment source | Admitting: Family Medicine

## 2013-03-23 ENCOUNTER — Ambulatory Visit: Payer: No Typology Code available for payment source | Admitting: Family Medicine

## 2013-03-23 ENCOUNTER — Ambulatory Visit: Payer: Self-pay | Admitting: Ophthalmology

## 2013-03-25 ENCOUNTER — Encounter: Payer: Self-pay | Admitting: Radiology

## 2013-03-26 ENCOUNTER — Encounter: Payer: Self-pay | Admitting: Family Medicine

## 2013-03-26 ENCOUNTER — Ambulatory Visit (INDEPENDENT_AMBULATORY_CARE_PROVIDER_SITE_OTHER): Payer: No Typology Code available for payment source | Admitting: Family Medicine

## 2013-03-26 VITALS — BP 130/64 | HR 84 | Temp 98.2°F | Wt 246.0 lb

## 2013-03-26 DIAGNOSIS — E785 Hyperlipidemia, unspecified: Secondary | ICD-10-CM

## 2013-03-26 DIAGNOSIS — J45909 Unspecified asthma, uncomplicated: Secondary | ICD-10-CM

## 2013-03-26 DIAGNOSIS — R7401 Elevation of levels of liver transaminase levels: Secondary | ICD-10-CM

## 2013-03-26 DIAGNOSIS — R413 Other amnesia: Secondary | ICD-10-CM

## 2013-03-26 DIAGNOSIS — R7402 Elevation of levels of lactic acid dehydrogenase (LDH): Secondary | ICD-10-CM

## 2013-03-26 DIAGNOSIS — F411 Generalized anxiety disorder: Secondary | ICD-10-CM

## 2013-03-26 DIAGNOSIS — J4520 Mild intermittent asthma, uncomplicated: Secondary | ICD-10-CM | POA: Insufficient documentation

## 2013-03-26 DIAGNOSIS — I10 Essential (primary) hypertension: Secondary | ICD-10-CM

## 2013-03-26 DIAGNOSIS — R7309 Other abnormal glucose: Secondary | ICD-10-CM

## 2013-03-26 LAB — CBC WITH DIFFERENTIAL/PLATELET
Basophils Absolute: 0 10*3/uL (ref 0.0–0.1)
Basophils Relative: 0.6 % (ref 0.0–3.0)
Eosinophils Absolute: 0.1 10*3/uL (ref 0.0–0.7)
Eosinophils Relative: 1.9 % (ref 0.0–5.0)
HCT: 42.5 % (ref 39.0–52.0)
Hemoglobin: 13.4 g/dL (ref 13.0–17.0)
Lymphocytes Relative: 24.5 % (ref 12.0–46.0)
Lymphs Abs: 1.8 10*3/uL (ref 0.7–4.0)
MCHC: 31.6 g/dL (ref 30.0–36.0)
MCV: 71.1 fl — ABNORMAL LOW (ref 78.0–100.0)
Monocytes Absolute: 0.8 10*3/uL (ref 0.1–1.0)
Monocytes Relative: 11.1 % (ref 3.0–12.0)
Neutro Abs: 4.5 10*3/uL (ref 1.4–7.7)
Neutrophils Relative %: 61.9 % (ref 43.0–77.0)
Platelets: 296 10*3/uL (ref 150.0–400.0)
RBC: 5.98 Mil/uL — ABNORMAL HIGH (ref 4.22–5.81)
RDW: 18.4 % — ABNORMAL HIGH (ref 11.5–14.6)
WBC: 7.2 10*3/uL (ref 4.5–10.5)

## 2013-03-26 LAB — TSH: TSH: 1.34 u[IU]/mL (ref 0.35–5.50)

## 2013-03-26 LAB — VITAMIN B12: Vitamin B-12: 242 pg/mL (ref 211–911)

## 2013-03-26 MED ORDER — ALPRAZOLAM 0.25 MG PO TABS: 0.2500 mg | ORAL_TABLET | Freq: Two times a day (BID) | ORAL | Status: DC | PRN

## 2013-03-26 NOTE — Patient Instructions (Addendum)
Stop at lab on your way out.  Follow up multiple issues, memory, breathing in 3 months.

## 2013-03-26 NOTE — Progress Notes (Signed)
  Subjective:    Patient ID: Cody Day, male    DOB: 14-Feb-1956, 57 y.o.   MRN: [id number]  HPI  57 year old male here for 3 month follow up on anxiety and resulting respiratory alkalosis. He was hospitalized in last month for respiratory alakalosis felt to be secondary to Anxiety and aspirin intake.  They started citalopram 20 mg for anxiety and alprazolam 0.25 mg BID.  Today he reports he is doing well. He has stopped citalopram given it made him feel like a zombie.  Only requiring alprazolam once a day most of the time. Work is a big cause of stress.   Hypertension:   Well controlled on lisinopril with out HCTZ. Using medication without problems or lightheadedness: None Chest pain with exertion:None Edema:None Short of breath:None Average home BPs:127/72 Other issues:  Sleep apnea... Stable on CPAP at current pressure. Mild obstructive lung disease on PFTs .Marland Kitchen Reversible: Asthma  Wife had noted memory loss gradual over the past year. He does take B vitamin.  No family hx of alzheimer's, parkinsons. There is familial tremor... Pt has this as well. MMSE: 30/30 nml clock drawing animal recall 11/15 in 30 sec   Continues to have moderate difficulty breathing, but does feel that symbicort and tudorza. Has only needed rescue inhaler two time in last 6 month. Has history of  Methamphetamine as well as other drugs smoking, no recent. At work there is a lot of airborne particulates.  Dr. Achilles Dunk following testosterone, 09/2013 700.. Stable control.   Review of Systems  Constitutional: Positive for fatigue. Negative for fever.  HENT: Negative for ear pain.   Eyes: Negative for pain.  Respiratory: Positive for cough and shortness of breath.   Cardiovascular: Negative for chest pain and palpitations.  Gastrointestinal: Negative for abdominal pain.       Objective:   Physical Exam  Constitutional: Vital signs are normal. He appears well-developed and well-nourished.  HENT:   Head: Normocephalic.  Right Ear: Hearing normal.  Left Ear: Hearing normal.  Nose: Nose normal.  Mouth/Throat: Oropharynx is clear and moist and mucous membranes are normal.  Neck: Trachea normal. Carotid bruit is not present. No mass and no thyromegaly present.  Cardiovascular: Normal rate, regular rhythm and normal pulses.  Exam reveals no gallop, no distant heart sounds and no friction rub.   No murmur heard. No peripheral edema  Pulmonary/Chest: Effort normal and breath sounds normal. No respiratory distress.  Skin: Skin is warm, dry and intact. No rash noted.  Psychiatric: He has a normal mood and affect. His speech is normal and behavior is normal. Thought content normal.          Assessment & Plan:

## 2013-03-27 LAB — VITAMIN D 25 HYDROXY (VIT D DEFICIENCY, FRACTURES): Vit D, 25-Hydroxy: 42 ng/mL (ref 30–89)

## 2013-03-29 ENCOUNTER — Other Ambulatory Visit: Payer: Self-pay | Admitting: *Deleted

## 2013-03-29 NOTE — Assessment & Plan Note (Signed)
Per pt well controlled on daily prn alprazolam. Stopped celexa because of SE.

## 2013-03-29 NOTE — Assessment & Plan Note (Signed)
Recent PFTs showed asthma. Pt improved on current meds but is concerned about longterm further decline given past history of inhaled drug abuse.

## 2013-03-29 NOTE — Assessment & Plan Note (Signed)
Well controlled. Continue current medication.  

## 2013-03-29 NOTE — Assessment & Plan Note (Signed)
Likely multifactorial. Evaluate with labs, consider further eval with MRi and neuro referral/ neurocognitive eval

## 2013-04-13 ENCOUNTER — Encounter: Payer: Self-pay | Admitting: Family Medicine

## 2013-05-25 ENCOUNTER — Other Ambulatory Visit: Payer: Self-pay | Admitting: *Deleted

## 2013-05-26 MED ORDER — ALPRAZOLAM 0.25 MG PO TABS: 0.2500 mg | ORAL_TABLET | Freq: Two times a day (BID) | ORAL | Status: DC | PRN

## 2013-05-26 NOTE — Telephone Encounter (Signed)
Refill called to pharmacy.

## 2013-06-24 ENCOUNTER — Encounter: Payer: Self-pay | Admitting: Radiology

## 2013-06-25 ENCOUNTER — Encounter: Payer: Self-pay | Admitting: Family Medicine

## 2013-06-25 ENCOUNTER — Ambulatory Visit (INDEPENDENT_AMBULATORY_CARE_PROVIDER_SITE_OTHER): Payer: No Typology Code available for payment source | Admitting: Family Medicine

## 2013-06-25 VITALS — BP 134/88 | HR 87 | Temp 98.2°F | Ht 71.0 in | Wt 250.2 lb

## 2013-06-25 DIAGNOSIS — I10 Essential (primary) hypertension: Secondary | ICD-10-CM

## 2013-06-25 DIAGNOSIS — J45909 Unspecified asthma, uncomplicated: Secondary | ICD-10-CM

## 2013-06-25 DIAGNOSIS — J4520 Mild intermittent asthma, uncomplicated: Secondary | ICD-10-CM

## 2013-06-25 DIAGNOSIS — R413 Other amnesia: Secondary | ICD-10-CM

## 2013-06-25 DIAGNOSIS — F411 Generalized anxiety disorder: Secondary | ICD-10-CM

## 2013-06-25 NOTE — Assessment & Plan Note (Signed)
Stable.. Cannot afford to go to neurologist at this time... We will repeat MMSE at CPX.

## 2013-06-25 NOTE — Progress Notes (Signed)
  Subjective:    Patient ID: Cody Day, male    DOB: Dec 24, 1955, 57 y.o.   MRN: [id number]  HPI  57 year old male here for 3 month follow up on anxiety and resulting respiratory alkalosis as well as multiple other issues.  He was hospitalized in severeal  months ago for respiratory alakalosis felt to be secondary to Anxiety and aspirin intake.  They started citalopram 20 mg for anxiety and alprazolam 0.25 mg BID.  He has stopped citalopram given it made him feel like a zombie.  Only requiring alprazolam once a day most of the time still at this time. Work is a big cause of stress.  Mother in law just passed away.  Hypertension: Well controlled on lisinopril with out HCTZ.  Using medication without problems or lightheadedness: None  Chest pain with exertion:None  Edema:None  Short of breath:None  Average home BPs:127/72  Other issues:   Sleep apnea... Stable on CPAP at current pressure.  Mild obstructive lung disease on PFTs .Marland Kitchen Reversible: Asthma  Followed by Dr. Welton Flakes but has no follow up scheduled. Continues to have moderate difficulty breathing, but does feel that symbicort and tudorza help  significantly. ( congestion improves but no change in capacity)  He has only had to use rescue inhaler few times over the summer) Has only needed rescue inhaler two time in last 6 month.  Has history of Methamphetamine as well as other drugs smoking, no recent.  At work there is a lot of airborne particulates.    At last OV: Wife had noted memory loss gradual over the past year.  He was referred to neurologist for neurocognitive testing. He reported he never went due to cost.  There has been minimal change since last OV.  03/2013 OV MMSE: 30/30  nml clock drawing  animal recall 11/15 in 30 sec    He states he does ingest cannibus... He is willing to stop so that he can continue on alprazolam.,       Review of Systems  Constitutional: Negative for fever and fatigue.  HENT:  Negative for ear pain.   Eyes: Negative for pain.  Respiratory: Positive for shortness of breath and wheezing.   Cardiovascular: Negative for chest pain, palpitations and leg swelling.  Gastrointestinal: Negative for abdominal pain.       Objective:   Physical Exam  Constitutional: Vital signs are normal. He appears well-developed and well-nourished.  HENT:  Head: Normocephalic.  Right Ear: Hearing normal.  Left Ear: Hearing normal.  Nose: Nose normal.  Mouth/Throat: Oropharynx is clear and moist and mucous membranes are normal.  Neck: Trachea normal. Carotid bruit is not present. No mass and no thyromegaly present.  Cardiovascular: Normal rate, regular rhythm and normal pulses.  Exam reveals no gallop, no distant heart sounds and no friction rub.   No murmur heard. No peripheral edema  Pulmonary/Chest: Effort normal and breath sounds normal. No respiratory distress.  Skin: Skin is warm, dry and intact. No rash noted.  Psychiatric: He has a normal mood and affect. His speech is normal and behavior is normal. Thought content normal.          Assessment & Plan:

## 2013-06-25 NOTE — Patient Instructions (Addendum)
Schedule CPX with labs prior in 11/2013.  Stop at lab on way out for UDS.

## 2013-06-25 NOTE — Assessment & Plan Note (Signed)
Recommended return to pulmonary if continued issue. Encouraged pt to stay on controllers instead of using rescue meds as his wifre wants him to. He may try stopping tudorza temporarily to see if he feels better off of it.

## 2013-06-25 NOTE — Assessment & Plan Note (Signed)
Well controlled. Continue current medication.  

## 2013-06-25 NOTE — Assessment & Plan Note (Signed)
Stable control on alprazolam.  Will stop cannibus use... UDS today. Plan repeat in 1 month given high risk.

## 2013-07-09 ENCOUNTER — Encounter: Payer: Self-pay | Admitting: Family Medicine

## 2013-07-13 ENCOUNTER — Other Ambulatory Visit: Payer: Self-pay | Admitting: *Deleted

## 2013-07-13 MED ORDER — ALPRAZOLAM 0.25 MG PO TABS: 0.2500 mg | ORAL_TABLET | Freq: Every day | ORAL | Status: DC

## 2013-07-13 NOTE — Telephone Encounter (Signed)
Called to [facility]. 

## 2013-07-13 NOTE — Telephone Encounter (Signed)
Last filled 05/26/13 

## 2013-08-13 ENCOUNTER — Other Ambulatory Visit: Payer: Self-pay | Admitting: *Deleted

## 2013-08-13 MED ORDER — ALPRAZOLAM 0.25 MG PO TABS: 0.2500 mg | ORAL_TABLET | Freq: Every day | ORAL | Status: DC

## 2013-08-13 NOTE — Telephone Encounter (Signed)
Called to [facility]. 

## 2013-08-13 NOTE — Telephone Encounter (Signed)
Last office visit 06/25/2013.  Ok to refill?

## 2013-10-27 ENCOUNTER — Other Ambulatory Visit: Payer: Self-pay

## 2013-10-27 MED ORDER — ALPRAZOLAM 0.25 MG PO TABS: 0.2500 mg | ORAL_TABLET | Freq: Every day | ORAL | Status: DC

## 2013-10-27 NOTE — Telephone Encounter (Signed)
Last filled 08/13/13--please advise

## 2013-10-28 NOTE — Telephone Encounter (Signed)
Rx called in to pharmacy. 

## 2013-12-20 ENCOUNTER — Telehealth: Payer: Self-pay | Admitting: Family Medicine

## 2013-12-20 ENCOUNTER — Other Ambulatory Visit (INDEPENDENT_AMBULATORY_CARE_PROVIDER_SITE_OTHER): Payer: No Typology Code available for payment source

## 2013-12-20 DIAGNOSIS — R7309 Other abnormal glucose: Secondary | ICD-10-CM

## 2013-12-20 DIAGNOSIS — Z125 Encounter for screening for malignant neoplasm of prostate: Secondary | ICD-10-CM

## 2013-12-20 DIAGNOSIS — E785 Hyperlipidemia, unspecified: Secondary | ICD-10-CM

## 2013-12-20 DIAGNOSIS — M109 Gout, unspecified: Secondary | ICD-10-CM

## 2013-12-20 LAB — COMPREHENSIVE METABOLIC PANEL
ALT: 25 U/L (ref 0–53)
AST: 25 U/L (ref 0–37)
Albumin: 3.8 g/dL (ref 3.5–5.2)
Alkaline Phosphatase: 51 U/L (ref 39–117)
BUN: 10 mg/dL (ref 6–23)
CO2: 27 mEq/L (ref 19–32)
Calcium: 9.1 mg/dL (ref 8.4–10.5)
Chloride: 102 mEq/L (ref 96–112)
Creatinine, Ser: 0.9 mg/dL (ref 0.4–1.5)
GFR: 98.71 mL/min (ref >60.00)
Glucose, Bld: 94 mg/dL (ref 70–99)
Potassium: 4.4 mEq/L (ref 3.5–5.1)
Sodium: 138 mEq/L (ref 135–145)
Total Bilirubin: 1.1 mg/dL (ref 0.3–1.2)
Total Protein: 6.9 g/dL (ref 6.0–8.3)

## 2013-12-20 LAB — LIPID PANEL
Cholesterol: 190 mg/dL (ref 0–200)
HDL: 68.2 mg/dL (ref >39.00)
LDL Cholesterol: 106 mg/dL — ABNORMAL HIGH (ref 0–99)
Total CHOL/HDL Ratio: 3
Triglycerides: 81 mg/dL (ref 0.0–149.0)
VLDL: 16.2 mg/dL (ref 0.0–40.0)

## 2013-12-20 LAB — PSA: PSA: 0.48 ng/mL (ref 0.10–4.00)

## 2013-12-20 LAB — HEMOGLOBIN A1C: Hgb A1c MFr Bld: 6.5 % (ref 4.6–6.5)

## 2013-12-20 LAB — URIC ACID: Uric Acid, Serum: 6.5 mg/dL (ref 4.0–7.8)

## 2013-12-20 NOTE — Telephone Encounter (Signed)
Message copied by [name] on Mon Dec 20, 2013  8:28 AM ------      Message from: Ellamae Sia      Created: Fri Dec 17, 2013 10:44 AM      Regarding: Lab orders for Monday, 3.2.15       Patient is scheduled for CPX labs, please order future labs, Thanks , Terri       ------

## 2013-12-24 ENCOUNTER — Ambulatory Visit (INDEPENDENT_AMBULATORY_CARE_PROVIDER_SITE_OTHER): Payer: No Typology Code available for payment source | Admitting: Family Medicine

## 2013-12-24 ENCOUNTER — Encounter: Payer: Self-pay | Admitting: Family Medicine

## 2013-12-24 VITALS — BP 140/88 | HR 88 | Temp 98.2°F | Ht 71.0 in | Wt 233.5 lb

## 2013-12-24 DIAGNOSIS — I10 Essential (primary) hypertension: Secondary | ICD-10-CM

## 2013-12-24 DIAGNOSIS — Z0000 Encounter for general adult medical examination without abnormal findings: Secondary | ICD-10-CM

## 2013-12-24 DIAGNOSIS — R7309 Other abnormal glucose: Secondary | ICD-10-CM

## 2013-12-24 DIAGNOSIS — M722 Plantar fascial fibromatosis: Secondary | ICD-10-CM

## 2013-12-24 DIAGNOSIS — E785 Hyperlipidemia, unspecified: Secondary | ICD-10-CM

## 2013-12-24 DIAGNOSIS — J449 Chronic obstructive pulmonary disease, unspecified: Secondary | ICD-10-CM

## 2013-12-24 MED ORDER — SIMVASTATIN 20 MG PO TABS: 20.0000 mg | ORAL_TABLET | Freq: Every day | ORAL | Status: DC

## 2013-12-24 MED ORDER — ALLOPURINOL 100 MG PO TABS: 100.0000 mg | ORAL_TABLET | Freq: Every day | ORAL | Status: DC

## 2013-12-24 MED ORDER — LISINOPRIL 20 MG PO TABS: 20.0000 mg | ORAL_TABLET | Freq: Every day | ORAL | Status: DC

## 2013-12-24 MED ORDER — BUDESONIDE-FORMOTEROL FUMARATE 160-4.5 MCG/ACT IN AERO: 2.0000 | INHALATION_SPRAY | Freq: Two times a day (BID) | RESPIRATORY_TRACT | Status: DC

## 2013-12-24 MED ORDER — ALBUTEROL SULFATE HFA 108 (90 BASE) MCG/ACT IN AERS: 1.0000 | INHALATION_SPRAY | Freq: Two times a day (BID) | RESPIRATORY_TRACT | Status: DC | PRN

## 2013-12-24 NOTE — Progress Notes (Signed)
HPI  58 year old male presents for wellness visit.  He was hospitalized in last year for respiratory alakalosis felt to be secondary to Anxiety and aspirin intake.  Has seen Dr. Freddie Breech and Dr. Rolly Salter Nephrologist  Reviewed notes.   He was on alprazolam 0.25 mg BID  for anxiety but is now no longer taking.  He feels that his anxiety is improved.  Has had recurrent hernia on left groin... Plans to follow up with surgeon. No current pain, only if coughing hard.   Hypertension: Well controlledd on lisinopril/HCTZ  BP Readings from Last 3 Encounters:  12/24/13 140/88  06/25/13 134/88  03/26/13 130/64  Using medication without problems or lightheadedness: None  Chest pain with exertion:None  Edema:None  Short of breath:Improved  Average home BPs:not checking.  Other issues:   Elevated Cholesterol: Almost at goal <70  given CAD on zocor 20 mg.  Lab Results  Component Value Date   CHOL 190 12/20/2013   HDL 68.20 12/20/2013   LDLCALC 106* 12/20/2013   LDLDIRECT 127.4 06/23/2009   TRIG 81.0 12/20/2013   CHOLHDL 3 12/20/2013  Using medications without problems:none  Muscle aches: None  Diet compliance:Good  Exercise: Yes Other complaints:   COPD: On on symbicort and tudorza. Will have to stop tudorza given price  He has not had to use albuterol more than every few months. He is breathing much better.  Has history of 10 years of smoking meth amphetamine. No current drug of tobacco use. Has follow up with Pulm Dr. Gwenette Greet in 03/2014  for sleep apnea on CPAP.  Needs a referral for a separate pulmonologist to handle SOB.  Prediabetes:  Resolved! He has been working on weight loss  And has lost 20 lbs! Lab Results  Component Value Date   HGBA1C 6.5 12/20/2013    Wt Readings from Last 3 Encounters:  12/24/13 233 lb 8 oz (105.915 kg)  06/25/13 250 lb 4 oz (113.513 kg)  03/26/13 246 lb (111.585 kg)    Gout: No recent flares on allopurinol. Uric acid 6.5  In last month though he has been  having pain in left heel. Pain is worst first stand up. Improves as he works it out.  Using tylenol.  No redness, no swelling. No heat.  Memory... Continues to be diminished, has worsened some. MMSE: 29/30 clock drawing is nml Animal recall decreased.. Only 8 in 30 sec  Will reassess in 6 months.  Review of Systems  Constitutional: Negative for fever, fatigue and unexpected weight change.  HENT: Negative for ear pain, congestion, sore throat, rhinorrhea, trouble swallowing and postnasal drip.  Eyes: Negative for pain.  Respiratory: Negative for cough, shortness of breath and wheezing.  Cardiovascular: Negative for chest pain, palpitations and leg swelling.  Gastrointestinal: Negative for nausea, abdominal pain, diarrhea, constipation and blood in stool.  Genitourinary: Negative for dysuria, urgency, hematuria, discharge, penile swelling, scrotal swelling, difficulty urinating, penile pain and testicular pain.  Skin: Negative for rash.  Neurological: Negative for syncope, weakness, light-headedness, numbness and headaches.  Psychiatric/Behavioral: Negative for behavioral problems and dysphoric mood. The patient is not nervous/anxious.  Objective:   Physical Exam  Constitutional: He appears well-developed and well-nourished. Non-toxic appearance. He does not appear ill. No distress.  HENT:  Head: Normocephalic and atraumatic.  Right Ear: Hearing, tympanic membrane, external ear and ear canal normal.  Left Ear: Hearing, tympanic membrane, external ear and ear canal normal.  Nose: Nose normal.  Mouth/Throat: Uvula is midline, oropharynx is clear and  moist and mucous membranes are normal.  Eyes: Conjunctivae, EOM and lids are normal. Pupils are equal, round, and reactive to light. No foreign bodies found.  Neck: Trachea normal, normal range of motion and phonation normal. Neck supple. Carotid bruit is not present. No mass and no thyromegaly present.  Cardiovascular: Normal rate, regular  rhythm, S1 normal, S2 normal, intact distal pulses and normal pulses. Exam reveals no gallop.  No murmur heard.  Pulmonary/Chest: Breath sounds normal. He has no wheezes. He has no rhonchi. He has no rales.  Abdominal: Soft. Normal appearance and bowel sounds are normal. There is no hepatosplenomegaly. There is no tenderness. There is no rebound, no guarding and no CVA tenderness. No hernia.  Lymphadenopathy:  He has no cervical adenopathy.  Neurological: He is alert. He has normal strength and normal reflexes. No cranial nerve deficit or sensory deficit. Gait normal.  Skin: Skin is warm, dry and intact. No rash noted.  Psychiatric: He has a normal mood and affect. His speech is normal and behavior is normal. Judgment and thought content normal. Cognition and memory are normal.  Assessment & Plan:   The patient's preventative maintenance and recommended screening tests for an annual wellness exam were reviewed in full today.  Brought up to date unless services declined.  Counselled on the importance of diet, exercise, and its role in overall health and mortality.  The patient's FH and SH was reviewed, including their home life, tobacco status, and drug and alcohol status.   Colon: 03/2010, polyps .Marland Kitchen Recommend repeat in 2 years.[facility]land Kitchenat [facility].  Vaccine: Td 2011, flu: given, PNA given as well.  PSA: Sees Dr. Jacqlyn Larsen.. prostate exam next week. Lab Results  Component Value Date   PSA 0.48 12/20/2013   PSA 0.34 11/24/2012   PSA 0.69 08/19/2011  Former smoker  PFT COPD 11/2012

## 2013-12-24 NOTE — Progress Notes (Signed)
Pre visit review using our clinic review tool, if applicable. No additional management support is needed unless otherwise documented below in the visit note. 

## 2013-12-24 NOTE — Patient Instructions (Addendum)
Stop at front desk to set up referral to pulmonologist, [facility] in [place]. Check blood pressure at home occassional.. [place] < 140/90.  Decrease sausage biscuits. Call to schedule repeat colonoscopy. Return for 6 month follow up on chol and memory with fasting labs prior. Start home stretching, ice for plantar fasciitis. Let me know if not resolving.

## 2013-12-25 ENCOUNTER — Telehealth: Payer: Self-pay | Admitting: Family Medicine

## 2013-12-25 NOTE — Telephone Encounter (Signed)
Relevant patient education assigned to patient using Emmi. ° °

## 2013-12-27 ENCOUNTER — Telehealth: Payer: Self-pay | Admitting: Family Medicine

## 2013-12-27 NOTE — Telephone Encounter (Signed)
Relevant patient education assigned to patient using Emmi. ° °

## 2014-01-24 NOTE — Assessment & Plan Note (Signed)
Start home stretching, ice for plantar fasciitis. Let me know if not resolving.

## 2014-01-24 NOTE — Assessment & Plan Note (Signed)
Well controlled. Continue current medication.  

## 2014-01-24 NOTE — Assessment & Plan Note (Signed)
Improved with weight loss and lifestyle changes.

## 2014-01-25 ENCOUNTER — Ambulatory Visit (INDEPENDENT_AMBULATORY_CARE_PROVIDER_SITE_OTHER): Payer: No Typology Code available for payment source | Admitting: Pulmonary Disease

## 2014-01-25 ENCOUNTER — Encounter: Payer: Self-pay | Admitting: Pulmonary Disease

## 2014-01-25 VITALS — BP 148/86 | HR 99 | Ht 71.0 in | Wt 231.0 lb

## 2014-01-25 DIAGNOSIS — J45909 Unspecified asthma, uncomplicated: Secondary | ICD-10-CM

## 2014-01-25 DIAGNOSIS — J449 Chronic obstructive pulmonary disease, unspecified: Secondary | ICD-10-CM

## 2014-01-25 MED ORDER — AEROCHAMBER MV MISC: Status: DC

## 2014-01-25 NOTE — Assessment & Plan Note (Signed)
Cody Day has what sounds like poorly controlled asthma which is been steadily worsening over the last 2-3 years. His pulmonary function testing from February 2014 showed significant hyperinflation as well as bronchial hyperreactivity. It is not uncommon to see chronic bronchitis symptoms and current and former methamphetamine users.  I am a bit puzzled as to why he has worsened over the last several years. Typically in asthmatics to worsen like this we think about a allergy and atypical infections. I also think that his inhaler technique is not quite right and so it's possible that he his not getting the optimal dose of Symbicort.  In his situation I think the wood dust at work is certainly not helping.  Plan: -Use Symbicort with a spacer -Test for serum IgE, Aspergillus IgE panel, RAST testing - Send sputum for routine culture, fungal culture, and AFB culture -Chest x-ray next line-if all the above testing is normal then consider a high-resolution CT scan of the chest to look for hypersensitivity pneumonitis  -If no improvement in 2-3 weeks with spacer use then I told him we could switch to nebulized long acting bronchodilators and corticosteroids such as Brovana and Pulmicort - Followup in 6 weeks after the AFB culture results are back

## 2014-01-25 NOTE — Progress Notes (Signed)
Subjective:    Patient ID: Cody Day, male    DOB: 28-Apr-1956, 58 y.o.   MRN: [id number]  HPI  This is a very pleasant 58 year old male who comes to our clinic today for evaluation of shortness of breath and increasing mucus production. He has a diagnosis of chronic bronchitis and asthma. He noted that he started smoking cigarettes at age 68 smoker age 71. Around age 66 he started doing harder drugs. He notes that he smoked hash for several years and used amphetamines for about 10-12 years before 1985. In 1985 he started smoking methamphetamine use this regularly for 11 years 12/09/1994. Since 1996 he has not used any drugs on a regular basis with the exception of occasional marijuana use. He prefers to eat marijuana then to smoke it. He has not smoked any cigarettes since age 62.  Over the years he has had shortness of breath and cough with mucus production. However, he notes that this is really worsened in the last 2-3 years. Specifically the mucus production has worsened quite a bit. 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 The environment is quite dusty and there is some milling of the lumbar that occurs there. He used to mill on a regular basis but because of increasing shortness of breath and cough he was noted to customer service about a year or so ago.  He has sleep apnea and uses CPAP machine regularly. He notes that because his mucus production has increased so much she's unable to sleep with a machine anymore. He coughs up significant amounts of brown to grey sputum on a regular basis throughout the course of the day. He also notes he gets more short of breath with activity such as working. When he is at work he can't carry anything anymore because of shortness of breath. Just  walking a few feet on level ground will make him short of breath as well.  He has not had fevers or chills with this. He occasionally has chest pain with his shortness of breath. A couple years ago he had a very extensive cardiac workup which he says was normal.  He has taken Symbicort and Tudorza up until recently when the latter of these medicines was discontinued by his insurance formulary. He currently takes Symbicort: At a higher dose (160/4.5) 2 puffs twice a day. He uses albuterol about 2 times a day.  He has no sinus symptoms and no acid reflux.    Past Medical History  Diagnosis Date  . Hypertension   . Asthma   . Gout   . Syncope 10/11    in settin gof asthma exacerbation w coughing. Ech (10/11): EF > 55%, mild LVH, grade I diastolic dysfunction, nomral RV size and systolic function,. normal valves. Carotid US (10/11): minimal disease  . Achilles tendon rupture 10/11    and repair  . COPD (chronic obstructive pulmonary disease)      Family History  Problem Relation Age of Onset  . Heart attack Mother 48  . Coronary artery disease Mother   . Hypertension Father   . Cataracts Father   . Diabetes Father   . Hypertension Sister   . Depression Sister   . Hypertension Brother   . Colon cancer Brother   . Diabetes  Other     1st degree relative  . Colon cancer Maternal Aunt      History   Social History  . Marital Status: Married    Spouse Name: N/A    Number of Children: Y  . Years of Education: N/A   Occupational History  . woodmilling specialist    Social History Main Topics  . Smoking status: Former Smoker -- 0.25 packs/day for 35 years    Types: Cigarettes    Quit date: 10/22/1995  . Smokeless tobacco: Former Systems developer     Comment: rarely smoked cigarettes and cigars.  states he smoked drugs mostly  . Alcohol Use: Yes     Comment: 6 beers daily; used to use cocaine and methamphetamine  . Drug Use: Yes    Special: Marijuana, Methamphetamines     Comment:  former smokerx 27 years  . Sexual Activity: Not on file   Other Topics Concern  . Not on file   Social History Narrative   Poor diet, lots of fats. Does not regularly exercise. Married. Originally from [place].      Allergies  Allergen Reactions  . Penicillins     REACTION: (Childhood - bumps, rash)     Outpatient Prescriptions Prior to Visit  Medication Sig Dispense Refill  . albuterol (PROVENTIL HFA;VENTOLIN HFA) 108 (90 BASE) MCG/ACT inhaler Inhale 1 puff into the lungs 2 (two) times daily as needed. Shortness of breath  8.5 g  5  . allopurinol (ZYLOPRIM) 100 MG tablet Take 1 tablet (100 mg total) by mouth daily.  30 tablet  11  . B Complex-C (SUPER B COMPLEX PO) Take 1 tablet by mouth daily.      . budesonide-formoterol (SYMBICORT) 160-4.5 MCG/ACT inhaler Inhale 2 puffs into the lungs 2 (two) times daily.  1 Inhaler  11  . Glucosamine HCl 1000 MG TABS Take 1 tablet by mouth daily.      Marland Kitchen lisinopril (PRINIVIL,ZESTRIL) 20 MG tablet Take 1 tablet (20 mg total) by mouth daily.  30 tablet  11  . Multiple Vitamins-Minerals (MULTIVITAL) tablet Take 1 tablet by mouth daily.        . simvastatin (ZOCOR) 20 MG tablet Take 1 tablet (20 mg total) by mouth at bedtime.  30 tablet  11  . testosterone cypionate (DEPOTESTOTERONE CYPIONATE) 100 MG/ML injection Inject 100 mg into the muscle every 7 (seven) days. On Fridays only uses 1/2 ml  For IM use only      . tiZANidine (ZANAFLEX) 4 MG capsule Take 2 mg by mouth as needed for muscle spasms.       No facility-administered medications prior to visit.      Review of Systems  Constitutional: Negative for fever and unexpected weight change.  HENT: Positive for congestion. Negative for dental problem, ear pain, nosebleeds, postnasal drip, rhinorrhea, sinus pressure, sneezing, sore throat and trouble swallowing.   Eyes: Negative for redness and itching.  Respiratory: Positive for cough, chest tightness, shortness of breath and wheezing.    Cardiovascular: Negative for palpitations and leg swelling.  Gastrointestinal: Negative for nausea and vomiting.  Genitourinary: Negative for dysuria.  Musculoskeletal: Negative for joint swelling.  Skin: Negative for rash.  Neurological: Negative for headaches.  Hematological: Does not bruise/bleed easily.  Psychiatric/Behavioral: Negative for dysphoric mood. The patient is not nervous/anxious.        Objective:   Physical Exam Filed Vitals:   01/25/14 1546  BP: 148/86  Pulse: 99  Height: 5\' 11"  (1.803 m)  Weight:  231 lb (104.781 kg)  SpO2: 97%  RA   Gen: well appearing, no acute distress HEENT: NCAT, PERRL, EOMi, OP clear, neck supple without masses PULM: CTA B CV: RRR, no mgr, no JVD AB: BS+, soft, nontender, no hsm Ext: warm, no edema, no clubbing, no cyanosis Derm: no rash or skin breakdown Neuro: A&Ox4, CN II-XII intact, strength 5/5 in all 4 extremities        Assessment & Plan:   Asthma, chronic Iwao has what sounds like poorly controlled asthma which is been steadily worsening over the last 2-3 years. His pulmonary function testing from February 2014 showed significant hyperinflation as well as bronchial hyperreactivity. It is not uncommon to see chronic bronchitis symptoms and current and former methamphetamine users.  I am a bit puzzled as to why he has worsened over the last several years. Typically in asthmatics to worsen like this we think about a allergy and atypical infections. I also think that his inhaler technique is not quite right and so it's possible that he his not getting the optimal dose of Symbicort.  In his situation I think the wood dust at work is certainly not helping.  Plan: -Use Symbicort with a spacer -Test for serum IgE, Aspergillus IgE panel, RAST testing - Send sputum for routine culture, fungal culture, and AFB culture -Chest x-ray next line-if all the above testing is normal then consider a high-resolution CT scan of the chest  to look for hypersensitivity pneumonitis  -If no improvement in 2-3 weeks with spacer use then I told him we could switch to nebulized long acting bronchodilators and corticosteroids such as Brovana and Pulmicort - Followup in 6 weeks after the AFB culture results are back   Updated Medication List Outpatient Encounter Prescriptions as of 01/25/2014  Medication Sig  . albuterol (PROVENTIL HFA;VENTOLIN HFA) 108 (90 BASE) MCG/ACT inhaler Inhale 1 puff into the lungs 2 (two) times daily as needed. Shortness of breath  . allopurinol (ZYLOPRIM) 100 MG tablet Take 1 tablet (100 mg total) by mouth daily.  Marland Kitchen aspirin 81 MG tablet Take 81 mg by mouth daily.  . B Complex-C (SUPER B COMPLEX PO) Take 1 tablet by mouth daily.  . budesonide-formoterol (SYMBICORT) 160-4.5 MCG/ACT inhaler Inhale 2 puffs into the lungs 2 (two) times daily.  . Glucosamine HCl 1000 MG TABS Take 1 tablet by mouth daily.  Marland Kitchen lisinopril (PRINIVIL,ZESTRIL) 20 MG tablet Take 1 tablet (20 mg total) by mouth daily.  . Multiple Vitamins-Minerals (MULTIVITAL) tablet Take 1 tablet by mouth daily.    . simvastatin (ZOCOR) 20 MG tablet Take 1 tablet (20 mg total) by mouth at bedtime.  Marland Kitchen testosterone cypionate (DEPOTESTOTERONE CYPIONATE) 100 MG/ML injection Inject 100 mg into the muscle every 7 (seven) days. On Fridays only uses 1/2 ml  For IM use only  . tiZANidine (ZANAFLEX) 4 MG capsule Take 2 mg by mouth as needed for muscle spasms.  Marland Kitchen Spacer/Aero-Holding Chambers (AEROCHAMBER MV) inhaler Use as instructed

## 2014-01-25 NOTE — Patient Instructions (Signed)
We will obtain a Chest X-ray, blood work, and a sputum culture for you Use the Symbicort with a spacer If you are not better in about three weeks, then call us and we will switch your symbicort to a nebulized form We will see you back in 6 weeks

## 2014-01-26 LAB — ALLERGY FULL PROFILE
Allergen, D pternoyssinus,d7: <0.1 kU/L
Allergen,Goose feathers, e70: <0.1 kU/L
Alternaria Alternata: <0.1 kU/L
Aspergillus fumigatus, m3: <0.1 kU/L
Bahia Grass: <0.1 kU/L
Bermuda Grass: <0.1 kU/L
Box Elder IgE: <0.1 kU/L
Candida Albicans: <0.1 kU/L
Cat Dander: <0.1 kU/L
Common Ragweed: <0.1 kU/L
Curvularia lunata: <0.1 kU/L
D. farinae: <0.1 kU/L
Dog Dander: <0.1 kU/L
Elm IgE: <0.1 kU/L
Fescue: <0.1 kU/L
G005 Rye, Perennial: <0.1 kU/L
G009 Red Top: <0.1 kU/L
Goldenrod: <0.1 kU/L
Helminthosporium halodes: <0.1 kU/L
House Dust Hollister: <0.1 kU/L
IgE (Immunoglobulin E), Serum: 9 IU/mL (ref 0.0–180.0)
Lamb's Quarters: <0.1 kU/L
Oak: <0.1 kU/L
Plantain: <0.1 kU/L
Stemphylium Botryosum: <0.1 kU/L
Sycamore Tree: <0.1 kU/L
Timothy Grass: <0.1 kU/L

## 2014-01-28 ENCOUNTER — Ambulatory Visit (INDEPENDENT_AMBULATORY_CARE_PROVIDER_SITE_OTHER)
Admission: RE | Admit: 2014-01-28 | Discharge: 2014-01-28 | Disposition: A | Discharge status: Home / Self Care | Payer: No Typology Code available for payment source | Source: Ambulatory Visit | Attending: Pulmonary Disease | Admitting: Pulmonary Disease

## 2014-01-28 DIAGNOSIS — J4489 Other specified chronic obstructive pulmonary disease: Secondary | ICD-10-CM

## 2014-01-28 DIAGNOSIS — J449 Chronic obstructive pulmonary disease, unspecified: Secondary | ICD-10-CM

## 2014-01-31 LAB — HYPERSENSITIVITY PNUEMONITIS PROFILE

## 2014-02-01 ENCOUNTER — Telehealth: Payer: Self-pay

## 2014-02-01 ENCOUNTER — Encounter: Payer: Self-pay | Admitting: Pulmonary Disease

## 2014-02-01 LAB — ASPERGILLUS IGE PANEL

## 2014-02-01 NOTE — Telephone Encounter (Signed)
Pt aware of results from both cxr and sputum culture.  Nothing further needed.

## 2014-02-01 NOTE — Telephone Encounter (Signed)
Message copied by Len Blalock on Tue Feb 01, 2014  4:52 PM ------      Message from: Cody Day      Created: Tue Feb 01, 2014  2:14 PM       A,            Please let him know that this was normal            Thanks      B ------

## 2014-03-21 ENCOUNTER — Ambulatory Visit (INDEPENDENT_AMBULATORY_CARE_PROVIDER_SITE_OTHER): Payer: No Typology Code available for payment source | Admitting: Pulmonary Disease

## 2014-03-21 ENCOUNTER — Encounter: Payer: Self-pay | Admitting: Pulmonary Disease

## 2014-03-21 VITALS — BP 126/74 | HR 92 | Ht 71.0 in | Wt 221.0 lb

## 2014-03-21 DIAGNOSIS — J45909 Unspecified asthma, uncomplicated: Secondary | ICD-10-CM

## 2014-03-21 DIAGNOSIS — J309 Allergic rhinitis, unspecified: Secondary | ICD-10-CM

## 2014-03-21 MED ORDER — MONTELUKAST SODIUM 10 MG PO TABS: 10.0000 mg | ORAL_TABLET | Freq: Every day | ORAL | Status: DC

## 2014-03-21 NOTE — Assessment & Plan Note (Signed)
This is contributing to his cough.  Plan: -Add saline rinses -Add singular

## 2014-03-21 NOTE — Assessment & Plan Note (Signed)
Authur has a form of asthma and chronic bronchitis which is likely due to the fact that he previously used a number of inhaled illicit substances. I think to some degree would allergy may also contribute to his symptoms as well as allergic rhinitis.  His symptoms have been very well-controlled with Symbicort and a spacer.  Plan: -Continue Symbicort with a spacer

## 2014-03-21 NOTE — Patient Instructions (Signed)
Try taking singulair once daily for your mucus production Keep taking symbicort as you are doing Use saline rinses twice daily when your mucus production is up We will see you back in 6 months or sooner if needed

## 2014-03-21 NOTE — Progress Notes (Signed)
Subjective:    Patient ID: Cody Day, male    DOB: 03/28/1956, 58 y.o.   MRN: [id number]  Synopsis: Shloime Keilman first saw the [facility] pulmonary clinic in the spring of 2015 for shortness of breath, cough, mucus production. He had lung function testing in 2014 which was completely normal. He had a past history significant for long-term inhaled illicit drug use with multiple substances including methamphetamine.  HPI  03/21/2014 routine office visit> Since starting Symbicort through a spacer [place]el has been doing quite well. His shortness of breath, cough, mucus production have improved. He does continue to have some sinus congestion in the middle the night as well as mucus production first thing in the morning. Clydene Laming continues to give him trouble throughout the course of the day but it is much better now that he is using Symbicort there is spacer.  Past Medical History  Diagnosis Date  . Hypertension   . Asthma   . Gout   . Syncope 10/11    in settin gof asthma exacerbation w coughing. Ech (10/11): EF > 55%, mild LVH, grade I diastolic dysfunction, nomral RV size and systolic function,. normal valves. Carotid US (10/11): minimal disease  . Achilles tendon rupture 10/11    and repair  . COPD (chronic obstructive pulmonary disease)        Review of Systems     Objective:   Physical Exam Filed Vitals:   03/21/14 1635  BP: 126/74  Pulse: 92  Height: 5\' 11"  (1.803 m)  Weight: 221 lb (100.245 kg)  SpO2: 96%   RA  Gen: well appearing, no acute distress HEENT: NCAT, EOMi, OP clear, PULM: CTA B CV: RRR, no mgr, no JVD AB: BS+, soft, nontender, no hsm Ext: warm, no edema, no clubbing, no cyanosis       Assessment & Plan:   Asthma, chronic Authur has a form of asthma and chronic bronchitis which is likely due to the fact that he previously used a number of inhaled illicit substances. I think to some degree would allergy may also contribute to his symptoms as well  as allergic rhinitis.  His symptoms have been very well-controlled with Symbicort and a spacer.  Plan: -Continue Symbicort with a spacer  Allergic rhinitis This is contributing to his cough.  Plan: -Add saline rinses -Add singular    Updated Medication List Outpatient Encounter Prescriptions as of 03/21/2014  Medication Sig  . albuterol (PROVENTIL HFA;VENTOLIN HFA) 108 (90 BASE) MCG/ACT inhaler Inhale 1 puff into the lungs 2 (two) times daily as needed. Shortness of breath  . allopurinol (ZYLOPRIM) 100 MG tablet Take 1 tablet (100 mg total) by mouth daily.  Marland Kitchen aspirin 81 MG tablet Take 81 mg by mouth daily.  . B Complex-C (SUPER B COMPLEX PO) Take 1 tablet by mouth daily.  . budesonide-formoterol (SYMBICORT) 160-4.5 MCG/ACT inhaler Inhale 2 puffs into the lungs 2 (two) times daily.  . Glucosamine HCl 1000 MG TABS Take 1 tablet by mouth daily.  Marland Kitchen lisinopril (PRINIVIL,ZESTRIL) 20 MG tablet Take 1 tablet (20 mg total) by mouth daily.  . Multiple Vitamins-Minerals (MULTIVITAL) tablet Take 1 tablet by mouth daily.    . simvastatin (ZOCOR) 20 MG tablet Take 1 tablet (20 mg total) by mouth at bedtime.  Marland Kitchen Spacer/Aero-Holding Chambers (AEROCHAMBER MV) inhaler Use as instructed  . testosterone cypionate (DEPOTESTOTERONE CYPIONATE) 100 MG/ML injection Inject 100 mg into the muscle every 7 (seven) days. On Fridays only uses 1/2 ml  For IM use  only  . tiZANidine (ZANAFLEX) 4 MG capsule Take 2 mg by mouth as needed for muscle spasms.

## 2014-05-19 ENCOUNTER — Encounter: Payer: Self-pay | Admitting: General Surgery

## 2014-05-23 ENCOUNTER — Encounter: Payer: Self-pay | Admitting: General Surgery

## 2014-05-23 ENCOUNTER — Ambulatory Visit (INDEPENDENT_AMBULATORY_CARE_PROVIDER_SITE_OTHER): Payer: Managed Care, Other (non HMO) | Admitting: General Surgery

## 2014-05-23 VITALS — BP 130/78 | HR 74 | Resp 14 | Ht 71.0 in | Wt 220.0 lb

## 2014-05-23 DIAGNOSIS — Z9889 Other specified postprocedural states: Secondary | ICD-10-CM

## 2014-05-23 DIAGNOSIS — R1032 Left lower quadrant pain: Secondary | ICD-10-CM

## 2014-05-23 DIAGNOSIS — Z8719 Personal history of other diseases of the digestive system: Secondary | ICD-10-CM

## 2014-05-23 NOTE — Progress Notes (Signed)
Patient ID: Cody Day, male   DOB: 02-Nov-1955, 58 y.o.   MRN: [id number]  Chief Complaint  Patient presents with  . Abdominal Pain    HPI [name] is a 58 y.o. male here today for a evaluation of a left lower abdominal pian. Patient states he noticed this since  November 2014 . The pain come when he laughs and coughs. He states he has lost 30 pounds since November due to cutting back on his calories. Patient moves his bowel two to three times daily.  HPI  Past Medical History  Diagnosis Date  . Hypertension   . Asthma   . Gout   . Syncope 10/11    in settin gof asthma exacerbation w coughing. Ech (10/11): EF > 55%, mild LVH, grade I diastolic dysfunction, nomral RV size and systolic function,. normal valves. Carotid US (10/11): minimal disease  . Achilles tendon rupture 10/11    and repair  . COPD (chronic obstructive pulmonary disease)   . Sleep apnea   . Hemorrhoids     Past Surgical History  Procedure Laterality Date  . Treadmill stress test  2003  . Intestinal blockage      as a child  . Abdominal hernia repair  [id number]  . Femoral hernia repair  2007  . Left heart cath  2008    minimal luminal irregularities EF 55%  . Kidney stone extraction      unic acid stones  . Calcium kidney stones    . Colonoscopy  2011  . Hemorrhoid surgery    . Hernia repair  09/10/2010    Repair of recurrent ventral hernia, resection of previously placed mesh x2, implantation 7.5 x 10 cm Gore-Tex dual mesh in an underlay position, repair of epigastric hernia with a 4.2 cm Proceed ventral patch.  . Hernia repair   02//28/2007    Laparoscopic right inguinal hernia repair with Surgipro mesh, Ventrilex patch at umbilicus  . Hernia repair  01/12/19992  .    Small oval Kugel patch placed in preperitoneal space, [name], MD  . Hernia repair  01/17/1997    Recurrent ventral hernia 15 x 19 Gore-Tex dual mesh placed laparoscopically with multiple lleefft--ssiiddedd poorrttss., [name],  MD  . Hernia repair  01/07/1995     Primary repair of ventral hernia. [name], MD    Family History  Problem Relation Age of Onset  . Heart attack Mother 71  . Coronary artery disease Mother   . Hypertension Father   . Cataracts Father   . Diabetes Father   . Hypertension Sister   . Depression Sister   . Hypertension Brother   . Colon cancer Brother   . Diabetes Other     1st degree relative  . Colon cancer Maternal Aunt     Social History History  Substance Use Topics  . Smoking status: Former Smoker -- 0.25 packs/day for 35 years    Types: Cigarettes    Quit date: 10/22/1995  . Smokeless tobacco: Former Systems developer     Comment: rarely smoked cigarettes and cigars.  states he smoked drugs mostly  . Alcohol Use: Yes     Comment: 6 beers daily; used to use cocaine and methamphetamine    Allergies  Allergen Reactions  . Penicillins     REACTION: (Childhood - bumps, rash)    Current Outpatient Prescriptions  Medication Sig Dispense Refill  . albuterol (PROVENTIL HFA;VENTOLIN HFA) 108 (90 BASE) MCG/ACT inhaler Inhale 1 puff into the  lungs 2 (two) times daily as needed. Shortness of breath  8.5 g  5  . allopurinol (ZYLOPRIM) 100 MG tablet Take 1 tablet (100 mg total) by mouth daily.  30 tablet  11  . B Complex-C (SUPER B COMPLEX PO) Take 1 tablet by mouth daily.      . budesonide-formoterol (SYMBICORT) 160-4.5 MCG/ACT inhaler Inhale 2 puffs into the lungs 2 (two) times daily.  1 Inhaler  11  . lisinopril (PRINIVIL,ZESTRIL) 20 MG tablet Take 1 tablet (20 mg total) by mouth daily.  30 tablet  11  . montelukast (SINGULAIR) 10 MG tablet Take 1 tablet (10 mg total) by mouth daily.  30 tablet  2  . Multiple Vitamins-Minerals (MULTIVITAL) tablet Take 1 tablet by mouth daily.        . simvastatin (ZOCOR) 20 MG tablet Take 1 tablet (20 mg total) by mouth at bedtime.  30 tablet  11  . Spacer/Aero-Holding Chambers (AEROCHAMBER MV) inhaler Use as instructed  1 each  0  . testosterone  cypionate (DEPOTESTOTERONE CYPIONATE) 100 MG/ML injection Inject 100 mg into the muscle every 7 (seven) days. On Fridays only uses 1/2 ml  For IM use only      . tiZANidine (ZANAFLEX) 4 MG capsule Take 2 mg by mouth as needed for muscle spasms.       No current facility-administered medications for this visit.    Review of Systems Review of Systems  Constitutional: Positive for activity change. Negative for fever, chills, diaphoresis, appetite change and fatigue.  Respiratory: Positive for cough, shortness of breath and wheezing. Negative for choking, chest tightness and stridor.   Cardiovascular: Negative.     Blood pressure 130/78, pulse 74, resp. rate 14, height 5\' 11"  (1.803 m), weight 220 lb (99.791 kg).  Physical Exam Physical Exam  Constitutional: He is oriented to person, place, and time. He appears well-developed and well-nourished.  Eyes: Conjunctivae are normal. No scleral icterus.  Neck: Neck supple.  Cardiovascular: Normal rate, regular rhythm and normal heart sounds.   Pulmonary/Chest: Effort normal and breath sounds normal.  Abdominal: Soft. Normal appearance and bowel sounds are normal. There is no tenderness.    budge at the [place] left lower quadrant.   Neurological: He is alert and oriented to person, place, and time.  Skin: Skin is warm and dry.    Data Reviewed Previous operative notes.  Assessment    Possible port site hernia.    Plan    The area of patient concern is fairly ill-defined, but may represent a port site hernia. He is not a candidate for laparoscopic exam based on his multiple previous procedures and prosthetic mesh involving the right upper quadrant and upper abdomen.  The advisability of a preprocedure CT scan to minimize surgical trauma was reviewed.  Patient has been scheduled for a CT abdomen/pelvis with contrast at [facility] for 05-24-14 at 3:30 pm (arrive 1:30 pm). Prep: NPO 6 hours prior.  Patient verbalizes understanding.  This  patient was sent to [facility] lab today to have the following labs drawn per [facility] request: creatinine. Lab results will be forwarded to [facility] once we have received.     PCP: Caren Griffins 05/24/2014, 12:07 PM

## 2014-05-23 NOTE — Patient Instructions (Addendum)
Patient to have a cat scan . Follow up appointment to be announced.  Patient has been scheduled for a CT abdomen/pelvis with contrast at [facility] for 05-24-14 at 3:30 pm (arrive 1:30 pm). Prep: NPO 6 hours prior.  Patient verbalizes understanding.  This patient is to have labs drawn at [facility] lab today.

## 2014-05-24 ENCOUNTER — Encounter: Payer: Self-pay | Admitting: General Surgery

## 2014-05-24 DIAGNOSIS — Z9889 Other specified postprocedural states: Secondary | ICD-10-CM

## 2014-05-24 DIAGNOSIS — Z8719 Personal history of other diseases of the digestive system: Secondary | ICD-10-CM | POA: Insufficient documentation

## 2014-05-24 DIAGNOSIS — R1032 Left lower quadrant pain: Secondary | ICD-10-CM | POA: Insufficient documentation

## 2014-05-24 LAB — CREATININE, SERUM
Creatinine, Ser: 0.88 mg/dL (ref 0.76–1.27)
GFR calc Af Amer: 110 mL/min/{1.73_m2} (ref >59)
GFR calc non Af Amer: 95 mL/min/{1.73_m2} (ref >59)

## 2014-05-25 ENCOUNTER — Encounter: Payer: Self-pay | Admitting: General Surgery

## 2014-05-31 ENCOUNTER — Telehealth: Payer: Self-pay | Admitting: *Deleted

## 2014-05-31 NOTE — Telephone Encounter (Signed)
Pt called saying that he was told by you to have a colonoscopy after he finishes his CT scan so he wanted his results and to talk to you about having a colonoscopy.

## 2014-06-03 ENCOUNTER — Telehealth: Payer: Self-pay | Admitting: General Surgery

## 2014-06-03 NOTE — Telephone Encounter (Signed)
Noted  

## 2014-06-03 NOTE — Telephone Encounter (Signed)
CT of the abdomen and pelvis from May 24, 2014 reviewed w/ special attention to the LLQ. No evidence of hernia. Small bowel adjacent to the anterior abdominal wall without thickening or dilatation. Small 2 mm pulmonary nodule and  4 cm left renal cyst incidental findings. In a patient who discontinue smoking 18 years ago, No follow up required for pulmonary nodule.   Patient asked to report any change in abdominal symptoms, he is welcome to return for re-assessment at any time.

## 2014-06-07 NOTE — Telephone Encounter (Signed)
Last colonoscopy at [facility] was dated 1999 by Dr. Alveta Heimlich.  At last OV he reported an exam done is 2011. Will attempt to determine if he is due for a follow up study. If completed in 2011, will need procedure notes and path report to determine if repeat exam is required.  Patient previously notified that CT did NOT show recurrent herniation.

## 2014-06-08 ENCOUNTER — Encounter: Payer: Self-pay | Admitting: General Surgery

## 2014-06-08 NOTE — Progress Notes (Signed)
Patient ID: Cody Day, male   DOB: 25-Apr-1956, 58 y.o.   MRN: [id number]  2011 endoscopy report reviewed.  Four 6-7 mm polyps removed. Normal follow up would be in 3-5 years.  Would suggest follow up colonoscopy in 2016.

## 2014-06-10 ENCOUNTER — Telehealth: Payer: Self-pay

## 2014-06-10 NOTE — Telephone Encounter (Signed)
Message copied by Lesly Rubenstein on Fri Jun 10, 2014  8:35 AM ------      Message from: Paola, Killen W      Created: Wed Jun 08, 2014  8:34 PM       Notify the patient the 2011 colonoscopy report and pathology was reviewed. He should plan on a repeat colonoscopy in 2016.      This can be completed through this office if he desired.  ------

## 2014-06-10 NOTE — Telephone Encounter (Signed)
Spoke with patient's wife Levada Dy about the report. She said that he would like for Dr Bary Castilla to do his next colonoscopy. Patient placed in recalls for 03/2015 for repeat colonoscopy.

## 2014-06-20 ENCOUNTER — Other Ambulatory Visit: Payer: Self-pay

## 2014-06-20 MED ORDER — MONTELUKAST SODIUM 10 MG PO TABS: 10.0000 mg | ORAL_TABLET | Freq: Every day | ORAL | Status: DC

## 2014-06-21 ENCOUNTER — Telehealth: Payer: Self-pay | Admitting: Family Medicine

## 2014-06-21 DIAGNOSIS — E785 Hyperlipidemia, unspecified: Secondary | ICD-10-CM

## 2014-06-21 DIAGNOSIS — R7309 Other abnormal glucose: Secondary | ICD-10-CM

## 2014-06-21 DIAGNOSIS — M109 Gout, unspecified: Secondary | ICD-10-CM

## 2014-06-21 NOTE — Telephone Encounter (Signed)
Message copied by [name] on Tue Jun 21, 2014  4:29 PM ------      Message from: Ellamae Sia      Created: Fri Jun 17, 2014  9:17 AM      Regarding: Lab orders for 9.2.15       Lab orders for a 6 month f/u ------

## 2014-06-22 ENCOUNTER — Other Ambulatory Visit (INDEPENDENT_AMBULATORY_CARE_PROVIDER_SITE_OTHER): Payer: PRIVATE HEALTH INSURANCE

## 2014-06-22 DIAGNOSIS — E785 Hyperlipidemia, unspecified: Secondary | ICD-10-CM

## 2014-06-22 LAB — COMPREHENSIVE METABOLIC PANEL
ALT: 31 U/L (ref 0–53)
AST: 33 U/L (ref 0–37)
Albumin: 3.8 g/dL (ref 3.5–5.2)
Alkaline Phosphatase: 49 U/L (ref 39–117)
BUN: 10 mg/dL (ref 6–23)
CO2: 28 mEq/L (ref 19–32)
Calcium: 9 mg/dL (ref 8.4–10.5)
Chloride: 101 mEq/L (ref 96–112)
Creatinine, Ser: 0.8 mg/dL (ref 0.4–1.5)
GFR: 101.28 mL/min (ref >60.00)
Glucose, Bld: 104 mg/dL — ABNORMAL HIGH (ref 70–99)
Potassium: 4.2 mEq/L (ref 3.5–5.1)
Sodium: 138 mEq/L (ref 135–145)
Total Bilirubin: 1.3 mg/dL — ABNORMAL HIGH (ref 0.2–1.2)
Total Protein: 7.1 g/dL (ref 6.0–8.3)

## 2014-06-22 LAB — LIPID PANEL
Cholesterol: 187 mg/dL (ref 0–200)
HDL: 77.6 mg/dL (ref >39.00)
LDL Cholesterol: 94 mg/dL (ref 0–99)
NonHDL: 109.4
Total CHOL/HDL Ratio: 2
Triglycerides: 79 mg/dL (ref 0.0–149.0)
VLDL: 15.8 mg/dL (ref 0.0–40.0)

## 2014-06-28 ENCOUNTER — Other Ambulatory Visit: Payer: Self-pay | Admitting: Family Medicine

## 2014-06-28 ENCOUNTER — Ambulatory Visit (INDEPENDENT_AMBULATORY_CARE_PROVIDER_SITE_OTHER): Payer: PRIVATE HEALTH INSURANCE | Admitting: Family Medicine

## 2014-06-28 ENCOUNTER — Encounter: Payer: Self-pay | Admitting: Family Medicine

## 2014-06-28 VITALS — BP 142/80 | HR 86 | Temp 97.6°F | Ht 71.0 in | Wt 222.5 lb

## 2014-06-28 DIAGNOSIS — Z139 Encounter for screening, unspecified: Secondary | ICD-10-CM

## 2014-06-28 DIAGNOSIS — F411 Generalized anxiety disorder: Secondary | ICD-10-CM

## 2014-06-28 DIAGNOSIS — R413 Other amnesia: Secondary | ICD-10-CM

## 2014-06-28 DIAGNOSIS — I10 Essential (primary) hypertension: Secondary | ICD-10-CM

## 2014-06-28 DIAGNOSIS — E785 Hyperlipidemia, unspecified: Secondary | ICD-10-CM

## 2014-06-28 DIAGNOSIS — R4701 Aphasia: Secondary | ICD-10-CM | POA: Insufficient documentation

## 2014-06-28 DIAGNOSIS — G250 Essential tremor: Secondary | ICD-10-CM

## 2014-06-28 DIAGNOSIS — G252 Other specified forms of tremor: Secondary | ICD-10-CM

## 2014-06-28 NOTE — Progress Notes (Signed)
Subjective:    Patient ID: Cody Day, male    DOB: 1956/04/22, 58 y.o.   MRN: [id number]  HPI  58 year old male with multiple medical issues presents for 6 month follow up.  He was hospitalized in last year for respiratory alkalosis felt to be secondary to Anxiety and aspirin intake.   He has recently seen 05/2014 Dr. Bary Castilla for likely planned revision of past hernia repair.  He was on alprazolam 0.25 mg BID for anxiety but is now no longer taking.  He feels that his anxiety is improved.  No depression.  Hypertension: Well controlled on lisinopril/HCTZ  BP Readings from Last 3 Encounters:  05/23/14 130/78  03/21/14 126/74  01/25/14 148/86  Using medication without problems or lightheadedness: None  Chest pain with exertion:None  Edema:None  Short of breath:Improved  Average home BPs:128/70  Other issues:   Elevated Cholesterol: Not at goal <70 given CAD on zocor 20 mg.  He cannot tolerate higer dose give musle ache. Lab Results  Component Value Date   CHOL 187 06/22/2014   HDL 77.60 06/22/2014   LDLCALC 94 06/22/2014   LDLDIRECT 127.4 06/23/2009   TRIG 79.0 06/22/2014   CHOLHDL 2 06/22/2014  Using medications without problems:none  Muscle aches: None  Diet compliance:Good  Exercise:walking Other complaints:   Asthma: On on symbicort He has not had to use albuterol more than every few months.  He is breathing much better.  Seeing Dr. Lake Bells for asthma, Has history of 10 years of smoking meth amphetamine. No current drug of tobacco use.   Prediabetes: Resolved! He has been working on weight loss And has lost 20 lbs!  Lab Results  Component Value Date   HGBA1C 6.5 12/20/2013   Wt Readings from Last 3 Encounters:  06/28/14 222 lb 8 oz (100.925 kg)  05/23/14 220 lb (99.791 kg)  03/21/14 221 lb (100.245 kg)    Memory... Continues to be diminished, has worsened some.   He states he feels lost in his mind. More trouble finding keys, etc. Has to look at keys and remember  what the function is. He feels the letters don't come out right when he writes. Spelling things wrong.   No hx of seizures. He has a tremor that is getting worse. (father had a benign essential tremor)  TODAY: MMSE: 28/30  Short term recall clock drawing is nml  Animal recall decreased.. Only 7 in 30 sec    Review of Systems  Constitutional: Negative for fever and fatigue.  HENT: Negative for ear pain.   Eyes: Negative for pain.  Respiratory: Negative for cough and shortness of breath.   Cardiovascular: Negative for chest pain and leg swelling.  Gastrointestinal: Negative for abdominal pain.       Objective:   Physical Exam  Constitutional: He is oriented to person, place, and time. Vital signs are normal. He appears well-developed and well-nourished.  HENT:  Head: Normocephalic.  Right Ear: Hearing normal.  Left Ear: Hearing normal.  Nose: Nose normal.  Mouth/Throat: Oropharynx is clear and moist and mucous membranes are normal.  Neck: Trachea normal. Carotid bruit is not present. No mass and no thyromegaly present.  Cardiovascular: Normal rate, regular rhythm and normal pulses.  Exam reveals no gallop, no distant heart sounds and no friction rub.   No murmur heard. No peripheral edema  Pulmonary/Chest: Effort normal and breath sounds normal. No respiratory distress.  Neurological: He is alert and oriented to person, place, and time. He displays  tremor. He displays no atrophy. No cranial nerve deficit or sensory deficit. He exhibits normal muscle tone. Coordination abnormal. Gait normal. GCS eye subscore is 4. GCS verbal subscore is 5. GCS motor subscore is 6.  Skin: Skin is warm, dry and intact. No rash noted.  Psychiatric: He has a normal mood and affect. His speech is normal and behavior is normal. Thought content normal.          Assessment & Plan:

## 2014-06-28 NOTE — Assessment & Plan Note (Signed)
Stable per pt

## 2014-06-28 NOTE — Patient Instructions (Addendum)
Continue working on weight loss, increase exercise as able, low cholesterol low carbohydrate diet. Stop at front desk to set up MRI of brain and setting up neuro referral.  Follow up in 3 month 30 min.   Fat and Cholesterol Control Diet Fat and cholesterol levels in your blood and organs are influenced by your diet. High levels of fat and cholesterol may lead to diseases of the heart, small and large blood vessels, gallbladder, liver, and pancreas. CONTROLLING FAT AND CHOLESTEROL WITH DIET Although exercise and lifestyle factors are important, your diet is key. That is because certain foods are known to raise cholesterol and others to lower it. The goal is to balance foods for their effect on cholesterol and more importantly, to replace saturated and trans fat with other types of fat, such as monounsaturated fat, polyunsaturated fat, and omega-3 fatty acids. On average, a person should consume no more than 15 to 17 g of saturated fat daily. Saturated and trans fats are considered "bad" fats, and they will raise LDL cholesterol. Saturated fats are primarily found in animal products such as meats, butter, and cream. However, that does not mean you need to give up all your favorite foods. Today, there are good tasting, low-fat, low-cholesterol substitutes for most of the things you like to eat. Choose low-fat or nonfat alternatives. Choose round or loin cuts of red meat. These types of cuts are lowest in fat and cholesterol. Chicken (without the skin), fish, veal, and ground [place] breast are great choices. Eliminate fatty meats, such as hot dogs and salami. Even shellfish have little or no saturated fat. Have a 3 oz (85 g) portion when you eat lean meat, poultry, or fish. Trans fats are also called "partially hydrogenated oils." They are oils that have been scientifically manipulated so that they are solid at room temperature resulting in a longer shelf life and improved taste and texture of foods in which  they are added. Trans fats are found in stick margarine, some tub margarines, cookies, crackers, and baked goods.  When baking and cooking, oils are a great substitute for butter. The monounsaturated oils are especially beneficial since it is believed they lower LDL and raise HDL. The oils you should avoid entirely are saturated tropical oils, such as coconut and palm.  Remember to eat a lot from food groups that are naturally free of saturated and trans fat, including fish, fruit, vegetables, beans, grains (barley, rice, couscous, bulgur wheat), and pasta (without cream sauces).  IDENTIFYING FOODS THAT LOWER FAT AND CHOLESTEROL  Soluble fiber may lower your cholesterol. This type of fiber is found in fruits such as apples, vegetables such as broccoli, potatoes, and carrots, legumes such as beans, peas, and lentils, and grains such as barley. Foods fortified with plant sterols (phytosterol) may also lower cholesterol. You should eat at least 2 g per day of these foods for a cholesterol lowering effect.  Read package labels to identify low-saturated fats, trans fat free, and low-fat foods at the supermarket. Select cheeses that have only 2 to 3 g saturated fat per ounce. Use a heart-healthy tub margarine that is free of trans fats or partially hydrogenated oil. When buying baked goods (cookies, crackers), avoid partially hydrogenated oils. Breads and muffins should be made from whole grains (whole-wheat or whole oat flour, instead of "flour" or "enriched flour"). Buy non-creamy canned soups with reduced salt and no added fats.  FOOD PREPARATION TECHNIQUES  Never deep-fry. If you must fry, either stir-fry, which uses very  little fat, or use non-stick cooking sprays. When possible, broil, bake, or roast meats, and steam vegetables. Instead of putting butter or margarine on vegetables, use lemon and herbs, applesauce, and cinnamon (for squash and sweet potatoes). Use nonfat yogurt, salsa, and low-fat dressings  for salads.  LOW-SATURATED FAT / LOW-FAT FOOD SUBSTITUTES Meats / Saturated Fat (g)  Avoid: Steak, marbled (3 oz/85 g) / 11 g  Choose: Steak, lean (3 oz/85 g) / 4 g  Avoid: Hamburger (3 oz/85 g) / 7 g  Choose: Hamburger, lean (3 oz/85 g) / 5 g  Avoid: Ham (3 oz/85 g) / 6 g  Choose: Ham, lean cut (3 oz/85 g) / 2.4 g  Avoid: Chicken, with skin, dark meat (3 oz/85 g) / 4 g  Choose: Chicken, skin removed, dark meat (3 oz/85 g) / 2 g  Avoid: Chicken, with skin, light meat (3 oz/85 g) / 2.5 g  Choose: Chicken, skin removed, light meat (3 oz/85 g) / 1 g Dairy / Saturated Fat (g)  Avoid: Whole milk (1 cup) / 5 g  Choose: Low-fat milk, 2% (1 cup) / 3 g  Choose: Low-fat milk, 1% (1 cup) / 1.5 g  Choose: Skim milk (1 cup) / 0.3 g  Avoid: Hard cheese (1 oz/28 g) / 6 g  Choose: Skim milk cheese (1 oz/28 g) / 2 to 3 g  Avoid: Cottage cheese, 4% fat (1 cup) / 6.5 g  Choose: Low-fat cottage cheese, 1% fat (1 cup) / 1.5 g  Avoid: Ice cream (1 cup) / 9 g  Choose: Sherbet (1 cup) / 2.5 g  Choose: Nonfat frozen yogurt (1 cup) / 0.3 g  Choose: Frozen fruit bar / trace  Avoid: Whipped cream (1 tbs) / 3.5 g  Choose: Nondairy whipped topping (1 tbs) / 1 g Condiments / Saturated Fat (g)  Avoid: Mayonnaise (1 tbs) / 2 g  Choose: Low-fat mayonnaise (1 tbs) / 1 g  Avoid: Butter (1 tbs) / 7 g  Choose: Extra light margarine (1 tbs) / 1 g  Avoid: Coconut oil (1 tbs) / 11.8 g  Choose: Olive oil (1 tbs) / 1.8 g  Choose: Corn oil (1 tbs) / 1.7 g  Choose: Safflower oil (1 tbs) / 1.2 g  Choose: Sunflower oil (1 tbs) / 1.4 g  Choose: Soybean oil (1 tbs) / 2.4 g  Choose: Canola oil (1 tbs) / 1 g Document Released: 10/07/2005 Document Revised: 02/01/2013 Document Reviewed: 01/05/2014 Exit[employer] Patient Information 2015 [place], [address]. This information is not intended to replace advice given to you by your health care provider. Make sure you discuss any questions you have with your  health care provider. diet.

## 2014-06-28 NOTE — Assessment & Plan Note (Signed)
Worsening with new aphasia. Will eval with MRI brain and refer to neuro. Nml neuro exam except balance off  with cerebellar exam and tremor.

## 2014-06-28 NOTE — Assessment & Plan Note (Signed)
Inadequate control but pt not interested in increasing statin given past SE on higher dose. He is also concerned statin may be causing memory issue. Discussed that he can try 1-2 week trial off statin to see if symptoms resolve.

## 2014-06-28 NOTE — Assessment & Plan Note (Signed)
He has had this for years, but ids getting worse. Family history of tremor. Likely benign essential but could be associated with his memory issues.  BBlockers contraindicated given his severe asthma. If not treated by neuro will consider med to treat at next OV.

## 2014-06-28 NOTE — Progress Notes (Signed)
Pre visit review using our clinic review tool, if applicable. No additional management support is needed unless otherwise documented below in the visit note. 

## 2014-06-28 NOTE — Assessment & Plan Note (Signed)
Well controlled. Continue current medication.  

## 2014-07-01 ENCOUNTER — Ambulatory Visit
Admission: RE | Admit: 2014-07-01 | Discharge: 2014-07-01 | Disposition: A | Discharge status: Home / Self Care | Payer: PRIVATE HEALTH INSURANCE | Source: Ambulatory Visit | Attending: Family Medicine | Admitting: Family Medicine

## 2014-07-01 ENCOUNTER — Other Ambulatory Visit: Payer: PRIVATE HEALTH INSURANCE

## 2014-07-01 DIAGNOSIS — R413 Other amnesia: Secondary | ICD-10-CM

## 2014-07-01 DIAGNOSIS — R4701 Aphasia: Secondary | ICD-10-CM

## 2014-07-26 ENCOUNTER — Ambulatory Visit (INDEPENDENT_AMBULATORY_CARE_PROVIDER_SITE_OTHER): Payer: PRIVATE HEALTH INSURANCE | Admitting: Neurology

## 2014-07-26 ENCOUNTER — Other Ambulatory Visit: Payer: Self-pay | Admitting: Neurology

## 2014-07-26 ENCOUNTER — Encounter: Payer: Self-pay | Admitting: Neurology

## 2014-07-26 VITALS — BP 140/86 | HR 88 | Ht 71.0 in | Wt 223.0 lb

## 2014-07-26 DIAGNOSIS — R251 Tremor, unspecified: Secondary | ICD-10-CM

## 2014-07-26 DIAGNOSIS — R413 Other amnesia: Secondary | ICD-10-CM

## 2014-07-26 DIAGNOSIS — G629 Polyneuropathy, unspecified: Secondary | ICD-10-CM

## 2014-07-26 NOTE — Progress Notes (Signed)
NEUROLOGY CONSULTATION NOTE  Cody Day MRN: [id number] DOB: [birth year]  Referring provider: Dr. Eliezer Lofts Primary care provider: Dr. Eliezer Lofts  Reason for consult:  Memory loss, tremors  Dear Dr Diona Browner:  Thank you for your kind referral of Cody Day for consultation of the above symptoms. Although his history is well known to you, please allow me to reiterate it for the purpose of our medical record. The patient was accompanied to the clinic by his wife who also provides collateral information. Records and images were personally reviewed where available.  HISTORY OF PRESENT ILLNESS: This is a pleasant 58 year old right-handed man with a history of hypertension, hyperlipidemia, COPD, OSA, anxiety, presenting for evaluation of memory loss and tremors. He and his wife started noticing memory changes around 3 years ago, he had difficulty with short-term memory but could recall events from 20 years ago. This has been worsening recently, where he would not recall conversations from the day prior. He would tell the same stories repeatedly. He has not gotten lost driving, but does not recall how he got to a place. He has left the stove on. He occasionally cannot recall if he already took his medication. His wife has always been in charge of the bills. He also has word-finding difficulties where he knows what he wants to say but can't find the word. He has difficulty thinking what he is supposed to do next. He states memory issues have not affected his work.  He has tremors however that have affected his work. 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 The tremors have affecting feeding and writing. He has also noticed that he would misspell words.Sometimes he has tremors throughout his body. He would get unstable when dressing up, he has fallen against the door trying to put his pants on.    He has headaches over the occipital region occurring around once a  week, described as "just a pain" that lasts until he takes Ibuprofen or Tylenol. There is no associated nausea, vomiting, phonophobia. He states he is always photosensitive. He has occasional numbness in both hands. He has pain in both feet but denies any paresthesias in the legs. He denies any diplopia, dysarthria, dysphagia, incontinence. No anosmia, constipation, REM behavior disorder. He has back pain and occasional tingling around the left shoulder blade.  He has a history of sleep apnea but has stopped using CPAP after he lost 40 lbs unintentionally over 7 months. He usually gets unrefreshing 5 hours of sleep.  His wife notes weight loss occurred after he changed jobs in November.  She has also noticed that since then, he does not want to seem to do very much or go anywhere. He is just sitting and "everything is blank, no thoughts, no feelings, no nothing."  He became tearful during this time.   There is a family history of tremors in his father, younger brother, sister, and son. His mother had memory problems in her 49s.  He provides additional information about significant drug abuse from age 48 until 32 (stopped in 57). He used LSD, PCP, MDMA, denies intravenous drug intake. He used to drink gin, but since the mid-80s drinks 6-8 beers daily. No change in tremors with alcohol intake.  I personally reviewed MRI brain without contrast which did not show any acute changes, no caudate head atrophy seen.  Laboratory Data: Lab Results  Component Value Date   WBC 7.2 03/26/2013   HGB  13.4 03/26/2013   HCT 42.5 03/26/2013   MCV 71.1* 03/26/2013   PLT 296.0 03/26/2013     Chemistry      Component Value Date/Time   NA 138 06/22/2014 0803   K 4.2 06/22/2014 0803   CL 101 06/22/2014 0803   CO2 28 06/22/2014 0803   BUN 10 06/22/2014 0803   CREATININE 0.8 06/22/2014 0803      Component Value Date/Time   CALCIUM 9.0 06/22/2014 0803   ALKPHOS 49 06/22/2014 0803   AST 33 06/22/2014 0803   ALT 31 06/22/2014 0803   BILITOT  1.3* 06/22/2014 0803     Lab Results  Component Value Date   TSH 1.34 03/26/2013    PAST MEDICAL HISTORY: Past Medical History  Diagnosis Date  . Hypertension   . Asthma   . Gout   . Syncope 10/11    in settin gof asthma exacerbation w coughing. Ech (10/11): EF > 55%, mild LVH, grade I diastolic dysfunction, nomral RV size and systolic function,. normal valves. Carotid US (10/11): minimal disease  . Achilles tendon rupture 10/11    and repair  . COPD (chronic obstructive pulmonary disease)   . Sleep apnea   . Hemorrhoids     PAST SURGICAL HISTORY: Past Surgical History  Procedure Laterality Date  . Treadmill stress test  2003  . Intestinal blockage      as a child  . Abdominal hernia repair  [id number]  . Femoral hernia repair  2007  . Left heart cath  2008    minimal luminal irregularities EF 55%  . Kidney stone extraction      unic acid stones  . Calcium kidney stones    . Colonoscopy  2011    [facility], [place], [place]; Dr. [name]. Multiple adenomatous polyps (total 4).   . Hemorrhoid surgery    . Hernia repair  09/10/2010    Repair of recurrent ventral hernia, resection of previously placed mesh x2, implantation 7.5 x 10 cm Gore-Tex dual mesh in an underlay position, repair of epigastric hernia with a 4.2 cm Proceed ventral patch.  . Hernia repair   02//28/2007    Laparoscopic right inguinal hernia repair with Surgipro mesh, Ventrilex patch at umbilicus  . Hernia repair  11/01/1990  .    Small oval Kugel patch placed in preperitoneal space, [name], MD  . Hernia repair  01/17/1997    Recurrent ventral hernia 15 x 19 Gore-Tex dual mesh placed laparoscopically with multiple lefft--sided ports, [name], MD  . Hernia repair  01/07/1995     Primary repair of ventral hernia. [name], MD    MEDICATIONS: Current Outpatient Prescriptions on File Prior to Visit  Medication Sig Dispense Refill  . albuterol (PROVENTIL HFA;VENTOLIN HFA) 108 (90 BASE)  MCG/ACT inhaler Inhale 1 puff into the lungs 2 (two) times daily as needed. Shortness of breath  8.5 g  5  . allopurinol (ZYLOPRIM) 100 MG tablet Take 1 tablet (100 mg total) by mouth daily.  30 tablet  11  . B Complex-C (SUPER B COMPLEX PO) Take 1 tablet by mouth daily.      . budesonide-formoterol (SYMBICORT) 160-4.5 MCG/ACT inhaler Inhale 2 puffs into the lungs 2 (two) times daily.  1 Inhaler  11  . lisinopril (PRINIVIL,ZESTRIL) 20 MG tablet Take 1 tablet (20 mg total) by mouth daily.  30 tablet  11  . montelukast (SINGULAIR) 10 MG tablet Take 1 tablet (10 mg total) by mouth daily.  Silvana  tablet  6  . Multiple Vitamins-Minerals (MULTIVITAL) tablet Take 1 tablet by mouth daily.        . simvastatin (ZOCOR) 20 MG tablet Take 1 tablet (20 mg total) by mouth at bedtime.  30 tablet  11  . Spacer/Aero-Holding Chambers (AEROCHAMBER MV) inhaler Use as instructed  1 each  0  . testosterone cypionate (DEPOTESTOTERONE CYPIONATE) 100 MG/ML injection Inject 100 mg into the muscle every 7 (seven) days. On Fridays only uses 1/2 ml  For IM use only      . tiZANidine (ZANAFLEX) 4 MG capsule Take 2 mg by mouth as needed for muscle spasms.       No current facility-administered medications on file prior to visit.    ALLERGIES: Allergies  Allergen Reactions  . Penicillins     REACTION: (Childhood - bumps, rash)    FAMILY HISTORY: Family History  Problem Relation Age of Onset  . Heart attack Mother 65  . Coronary artery disease Mother   . Hypertension Father   . Cataracts Father   . Diabetes Father   . Hypertension Sister   . Depression Sister   . Hypertension Brother   . Colon cancer Brother   . Diabetes Other     1st degree relative  . Colon cancer Maternal Aunt     SOCIAL HISTORY: History   Social History  . Marital Status: Married    Spouse Name: N/A    Number of Children: Y  . Years of Education: N/A   Occupational History  . woodmilling specialist    Social History Main Topics    . Smoking status: Former Smoker -- 0.25 packs/day for 35 years    Types: Cigarettes    Quit date: 10/22/1995  . Smokeless tobacco: Former Systems developer     Comment: rarely smoked cigarettes and cigars.  states he smoked drugs mostly  . Alcohol Use: Yes     Comment: 6 beers daily; used to use cocaine and methamphetamine  . Drug Use: No     Comment: former smokerx 27 years  . Sexual Activity: Not on file   Other Topics Concern  . Not on file   Social History Narrative   Poor diet, lots of fats. Does not regularly exercise. Married. Originally from [place].     REVIEW OF SYSTEMS: Constitutional: No fevers, chills, or sweats, no generalized fatigue, change in appetite Eyes: No visual changes, double vision, eye pain Ear, nose and throat: No hearing loss, ear pain, nasal congestion, sore throat Cardiovascular: No chest pain, palpitations Respiratory:  + shortness of breath at rest or with exertion, no wheezes GastrointestinaI: No nausea, vomiting, diarrhea, abdominal pain, fecal incontinence Genitourinary:  No dysuria, urinary retention or frequency Musculoskeletal:  +neck pain, back pain Integumentary: No rash, pruritus, skin lesions Neurological: as above Psychiatric: + depression, insomnia, anxiety Endocrine: No palpitations, fatigue, diaphoresis, mood swings, change in appetite, change in weight, increased thirst Hematologic/Lymphatic:  No anemia, purpura, petechiae. Allergic/Immunologic: no itchy/runny eyes, nasal congestion, recent allergic reactions, rashes  PHYSICAL EXAM: Filed Vitals:   07/26/14 1238  BP: 140/86  Pulse: 88   General: No acute distress, became tearful during the visit Head:  Normocephalic/atraumatic Eyes: Fundoscopic exam shows bilateral sharp discs, no vessel changes, exudates, or hemorrhages Neck: supple, no paraspinal tenderness, full range of motion Back: No paraspinal tenderness Heart: regular rate and rhythm Lungs: Clear to auscultation  bilaterally. Vascular: No carotid bruits. Skin/Extremities: No rash, no edema Neurological Exam: Mental status: alert and oriented to person,  place, and time, no dysarthria or aphasia, Fund of knowledge is appropriate.  Recent and remote memory are intact.  Attention and concentration are normal.    Able to name objects and repeat phrases. Montreal Cognitive Assessment  07/28/2014  Visuospatial/ Executive (0/5) 5  Naming (0/3) 3  Attention: Read list of digits (0/2) 2  Attention: Read list of letters (0/1) 1  Attention: Serial 7 subtraction starting at 100 (0/3) 3  Language: Repeat phrase (0/2) 2  Language : Fluency (0/1) 0  Abstraction (0/2) 2  Delayed Recall (0/5) 5  Orientation (0/6) 6  Total 29  Adjusted Score (based on education) 29   Cranial nerves: CN I: not tested CN II: pupils equal, round and reactive to light, visual fields intact, fundi unremarkable. CN III, IV, VI:  full range of motion, no nystagmus, no ptosis CN V: facial sensation intact CN VII: upper and lower face symmetric CN VIII: hearing intact to finger rub CN IX, X: gag intact, uvula midline CN XI: sternocleidomastoid and trapezius muscles intact CN XII: tongue midline Bulk & Tone: normal, no cogwheeling, no fasciculations. Motor: 5/5 throughout with no pronator drift. Sensation: patchy sensory changes, decreased cold on right UE, left LE, intact to pin. Decreased vibration to bilateral ankles, intact joint position sense. Romberg test positive Deep Tendon Reflexes: +2 throughout except for absent right ankle jerk, no ankle clonus Plantar responses: downgoing bilaterally Cerebellar: no incoordination on finger to nose, heel to shin with endpoint tremor. No dysdiadochokinesia Gait: slow and cautious, good arm swing, no pill rolling tremor. Able to tandem walk with mild difficulty. No postural instability. Tremor: No resting tremor. There is pronounced postural and action tremor noted bilaterally. He also has a  mild bilateral leg tremor noted with heel to shin testing, L>R. No jaw or head tremor seen.  IMPRESSION: This is a pleasant 58 year old right-handed man with a history of hypertension, hyperlipidemia, COPD, OSA, presenting for evaluation of memory loss and worsening tremors. His neurological exam today shows mild neuropathy, as well as bilateral tremor in upper>lower extremities, suggestive of essential tremor. There is a strong family history of tremor. Bloodwork for TSH, RPR, HIV, B12, heavy metal screen, serum copper, ceruloplasmin, ANA, ESR, CRP, CPK, SPEP/IFE will be ordered today. His MOCA score today is 29/30. MRI brain unremarkable. We discussed memory issues suggestive of mild cognitive impairment, there may be a component of depression, he will benefit from neuropsychological evaluation to further delineate this. We discussed treatment options for tremor, I am hesitant to start a beta-blocker due to his pulmonary issues. Primidone may worsen cognitive deficits. He may be a candidate for DBS and consideration for referral to our Movement Disorder specialist Dr. Carles Collet will be done after the tests.  He will follow-up in 1 month.  Thank you for allowing me to participate in the care of this patient. Please do not hesitate to call for any questions or concerns.   Ellouise Newer, M.D.  CC: Dr. Diona Browner

## 2014-07-26 NOTE — Patient Instructions (Signed)
1. Bloodwork for TSH, RPR, HIV, B12, heavy metal screen, serum copper, ceruloplasmin, ANA, ESR, CRP, CPK, SPEP/IFE 2. EMG/NCV of left UE and LE 3. Refer for Neuropsychological evaluation at [facility] 4. Follow-up after tests

## 2014-07-27 ENCOUNTER — Telehealth: Payer: Self-pay | Admitting: Family Medicine

## 2014-07-27 ENCOUNTER — Telehealth: Payer: Self-pay | Admitting: Neurology

## 2014-07-27 ENCOUNTER — Encounter: Payer: Self-pay | Admitting: Neurology

## 2014-07-27 LAB — TSH: TSH: 1.207 u[IU]/mL (ref 0.350–4.500)

## 2014-07-27 LAB — ANA: Anti Nuclear Antibody(ANA): NEGATIVE

## 2014-07-27 LAB — C-REACTIVE PROTEIN: CRP: <0.5 mg/dL (ref <0.60)

## 2014-07-27 LAB — VITAMIN B12: Vitamin B-12: 334 pg/mL (ref 211–911)

## 2014-07-27 LAB — CK: Total CK: 218 U/L (ref 7–232)

## 2014-07-27 LAB — SEDIMENTATION RATE: Sed Rate: 1 mm/hr (ref 0–16)

## 2014-07-27 LAB — RPR

## 2014-07-27 LAB — HIV ANTIBODY (ROUTINE TESTING W REFLEX): HIV 1&2 Ab, 4th Generation: NONREACTIVE

## 2014-07-27 NOTE — Telephone Encounter (Signed)
Pt wife wants to talk to someone about the test that has to be done in [place] please Levada Dy (pt wife) at [phone]

## 2014-07-27 NOTE — Telephone Encounter (Signed)
Message copied by Thurmon Fair on Wed Jul 27, 2014  4:13 PM ------      Message from: Blenda Peals      Created: Wed Jul 27, 2014  2:15 PM      Regarding: Juanita Craver from [employer]: [place] from [place] lab called in regards to pt's lab order. REF# [id number] for the lab order.       C/b [phone] ------

## 2014-07-27 NOTE — Telephone Encounter (Signed)
Returned call to [employer]....they needed clarification on 1 of pts lab orders from yesterday. Heavy metal screening is supposed to be blood test not urine test. Lab tech at [facility] draw station was supposed to take urine test out and put order in for blood test. The order has now been fixed for blood test and not urine test.

## 2014-07-27 NOTE — Telephone Encounter (Signed)
Returned call. She wanted to know how referral to [facility] works. Did explain that I will fax pt records for their review and they will call them for an appt.

## 2014-07-27 NOTE — Telephone Encounter (Signed)
Message copied by Thurmon Fair on Wed Jul 27, 2014  4:20 PM ------      Message from: Blenda Peals      Created: Wed Jul 27, 2014  2:15 PM      Regarding: Juanita Craver from [employer]: [place] from [place] lab called in regards to pt's lab order. REF# [id number] for the lab order.       C/b [phone] ------

## 2014-07-28 ENCOUNTER — Encounter: Payer: Self-pay | Admitting: Neurology

## 2014-07-28 DIAGNOSIS — G629 Polyneuropathy, unspecified: Secondary | ICD-10-CM | POA: Insufficient documentation

## 2014-07-28 DIAGNOSIS — G250 Essential tremor: Secondary | ICD-10-CM | POA: Insufficient documentation

## 2014-07-28 LAB — PROTEIN ELECTROPHORESIS, SERUM
Albumin ELP: 59.2 % (ref 55.8–66.1)
Alpha-1-Globulin: 3.7 % (ref 2.9–4.9)
Alpha-2-Globulin: 10 % (ref 7.1–11.8)
Beta 2: 5.9 % (ref 3.2–6.5)
Beta Globulin: 7.1 % (ref 4.7–7.2)
Gamma Globulin: 14.1 % (ref 11.1–18.8)
Total Protein, Serum Electrophoresis: 6.9 g/dL (ref 6.0–8.3)

## 2014-07-28 LAB — CERULOPLASMIN: Ceruloplasmin: 23 mg/dL (ref 18–36)

## 2014-07-28 LAB — COPPER, SERUM: Copper: 81 ug/dL (ref 70–175)

## 2014-07-28 LAB — HEAVY METALS, BLOOD
Arsenic: <3 mcg/L (ref <23)
Lead: <2 ug/dL (ref <10)
Mercury, B: <4 mcg/L (ref <=10)

## 2014-07-28 LAB — IMMUNOFIXATION ELECTROPHORESIS
IgA: 368 mg/dL (ref 68–379)
IgG (Immunoglobin G), Serum: 915 mg/dL (ref 650–1600)
IgM, Serum: 52 mg/dL (ref 41–251)
Total Protein, Serum Electrophoresis: 6.9 g/dL (ref 6.0–8.3)

## 2014-09-06 ENCOUNTER — Ambulatory Visit: Payer: PRIVATE HEALTH INSURANCE

## 2014-09-06 ENCOUNTER — Encounter: Payer: Self-pay | Admitting: Pulmonary Disease

## 2014-09-06 ENCOUNTER — Ambulatory Visit (INDEPENDENT_AMBULATORY_CARE_PROVIDER_SITE_OTHER): Payer: PRIVATE HEALTH INSURANCE | Admitting: Pulmonary Disease

## 2014-09-06 VITALS — BP 130/80 | HR 85 | Ht 71.0 in | Wt 222.0 lb

## 2014-09-06 DIAGNOSIS — J4530 Mild persistent asthma, uncomplicated: Secondary | ICD-10-CM

## 2014-09-06 DIAGNOSIS — Z23 Encounter for immunization: Secondary | ICD-10-CM

## 2014-09-06 DIAGNOSIS — G4733 Obstructive sleep apnea (adult) (pediatric): Secondary | ICD-10-CM

## 2014-09-06 MED ORDER — OLMESARTAN MEDOXOMIL 40 MG PO TABS: 40.0000 mg | ORAL_TABLET | Freq: Every day | ORAL | Status: DC

## 2014-09-06 NOTE — Assessment & Plan Note (Addendum)
This has been a stable interval for Cody Day with his asthma. However, he continues to have a significant amount of mucus in his throat in the mornings which she attributes to the poor air quality in his work environment.  I explained to him today that some component of this may be the lisinopril as people on that often do experience an itchy, scratchy throat. However, the most likely scenario is that he has asthma with a chronic bronchitis phenotype which is exacerbated by all the wood dust in the air in his environment.  Plan: -Continue Symbicort -Try switching off of lisinopril to Benicar for 2 months -Flu shot today -Followup 6 months

## 2014-09-06 NOTE — Assessment & Plan Note (Signed)
Cody Day says he has not been using his CPAP machine in over a year because of significant weight loss. He says that his wife has not noted snoring or apneas in the evening.  However, he has recently been evaluated by a neuropsychologist for short-term memory loss. Part of that workup included an evaluation of his sleep. They recommended that he have another sleep study.  Plan: -I will forward this information to Cody Day's sleep specialist, Dr. Gwenette Greet to see if he feels that Cody Day needs another sleep study after the significant weight loss.

## 2014-09-06 NOTE — Patient Instructions (Signed)
We will ask Dr. Gwenette Greet about ordering another sleep study Stop lisinopril, take the Benicar daily for two months to see if that helps with the cough Keep taking your inhalers as you are doing We will see you back in 6 months or sooner if needed

## 2014-09-06 NOTE — Progress Notes (Signed)
Subjective:    Patient ID: Cody Day, male    DOB: 1956/10/19, 58 y.o.   MRN: [id number]  Synopsis: Jabri Blancett first saw the [facility] pulmonary clinic in the spring of 2015 for shortness of breath, cough, mucus production. He had lung function testing in 2014 which was completely normal. He had a past history significant for long-term inhaled illicit drug use with multiple substances including methamphetamine.  HPI  Chief Complaint  Patient presents with  . Follow-up    Pt c/o congestion collecting in throat, prod cough with gray mucus.  Sob with exertion unchanged.  Pt not wearing cpap about 1 year.      09/06/2014 ROV> [name] states that his breathing has been about the same since the last visit. He has minimal shortness of breath on exertion and is capable of doing whatever he needs to do during the daytime without too much difficulty. However, he does note significant amounts of grey to yellow sputum which he produces throughout the course of the morning. Typically this is worse in the mornings and is quite thick. He says he coughs up a significant amount when he is in the shower. This will persist for about 2 hours and then throughout the rest of the day he does not have much problem with mucus production. However, he does have a scratchy throat throughout the course of the day and he frequently clears his throat. He continues to use his Symbicort regularly. He rinses his mouth after using it. He has recently undergone neuro psychology testing for short-term memory loss. He tells me that he has not been using his CPAP machine since losing a significant amount of weight. His wife says that he does not snore nor does she witnessed apneas anymore.  Past Medical History  Diagnosis Date  . Hypertension   . Asthma   . Gout   . Syncope 10/11    in settin gof asthma exacerbation w coughing. Ech (10/11): EF > 55%, mild LVH, grade I diastolic dysfunction, nomral RV size and systolic  function,. normal valves. Carotid US (10/11): minimal disease  . Achilles tendon rupture 10/11    and repair  . COPD (chronic obstructive pulmonary disease)   . Sleep apnea   . Hemorrhoids        Review of Systems  Constitutional: Negative for fever, chills and fatigue.  HENT: Positive for postnasal drip. Negative for rhinorrhea and sinus pressure.   Respiratory: Positive for cough. Negative for shortness of breath and wheezing.   Cardiovascular: Negative for chest pain and palpitations.       Objective:   Physical Exam  Filed Vitals:   09/06/14 1626  BP: 130/80  Pulse: 85  Height: 5\' 11"  (1.803 m)  Weight: 222 lb (100.699 kg)  SpO2: 96%   RA  Gen: well appearing, no acute distress HEENT: NCAT, EOMi, OP clear, PULM: CTA B CV: RRR, no mgr, no JVD AB: BS+, soft, nontender, no hsm Ext: warm, no edema, no clubbing, no cyanosis       Assessment & Plan:   Asthma, chronic This has been a stable interval for [name] with his asthma. However, he continues to have a significant amount of mucus in his throat in the mornings which she attributes to the poor air quality in his work environment.  I explained to him today that some component of this may be the lisinopril as people on that often do experience an itchy, scratchy throat. However, the most likely  scenario is that he has asthma with a chronic bronchitis phenotype which is exacerbated by all the wood dust in the air in his environment.  Plan: -Continue Symbicort -Try switching off of lisinopril to Benicar for 2 months -Flu shot today -Followup 6 months  OSA (obstructive sleep apnea) [name] says he has not been using his CPAP machine in over a year because of significant weight loss. He says that his wife has not noted snoring or apneas in the evening.  However, he has recently been evaluated by a neuropsychologist for short-term memory loss. Part of that workup included an evaluation of his sleep. They recommended  that he have another sleep study.  Plan: -I will forward this information to [name]'s sleep specialist, Dr. Gwenette Greet to see if he feels that [name] needs another sleep study after the significant weight loss.    Updated Medication List Outpatient Encounter Prescriptions as of 09/06/2014  Medication Sig  . albuterol (PROVENTIL HFA;VENTOLIN HFA) 108 (90 BASE) MCG/ACT inhaler Inhale 1 puff into the lungs 2 (two) times daily as needed. Shortness of breath  . allopurinol (ZYLOPRIM) 100 MG tablet Take 1 tablet (100 mg total) by mouth daily.  . B Complex-C (SUPER B COMPLEX PO) Take 1 tablet by mouth daily.  . budesonide-formoterol (SYMBICORT) 160-4.5 MCG/ACT inhaler Inhale 2 puffs into the lungs 2 (two) times daily.  . Cinnamon 500 MG capsule Take 500 mg by mouth 2 (two) times daily.  . Garlic 660 MG TABS Take 1 tablet by mouth daily.  . montelukast (SINGULAIR) 10 MG tablet Take 1 tablet (10 mg total) by mouth daily.  . Multiple Vitamins-Minerals (MULTIVITAL) tablet Take 1 tablet by mouth daily.    . simvastatin (ZOCOR) 20 MG tablet Take 1 tablet (20 mg total) by mouth at bedtime.  Marland Kitchen Spacer/Aero-Holding Chambers (AEROCHAMBER MV) inhaler Use as instructed  . testosterone cypionate (DEPOTESTOTERONE CYPIONATE) 100 MG/ML injection Inject 100 mg into the muscle every 7 (seven) days. On Fridays only uses 1/2 ml  For IM use only  . tiZANidine (ZANAFLEX) 4 MG capsule Take 2 mg by mouth as needed for muscle spasms.  . [DISCONTINUED] lisinopril (PRINIVIL,ZESTRIL) 20 MG tablet Take 1 tablet (20 mg total) by mouth daily.  Marland Kitchen olmesartan (BENICAR) 40 MG tablet Take 1 tablet (40 mg total) by mouth daily.

## 2014-09-07 ENCOUNTER — Telehealth: Payer: Self-pay | Admitting: Pulmonary Disease

## 2014-09-07 ENCOUNTER — Other Ambulatory Visit: Payer: Self-pay | Admitting: Pulmonary Disease

## 2014-09-07 DIAGNOSIS — G4733 Obstructive sleep apnea (adult) (pediatric): Secondary | ICD-10-CM

## 2014-09-07 NOTE — Telephone Encounter (Signed)
I called Cody Day to let him know that he should use his CPAP machine regularly until we can schedule another sleep study after the first of the year.

## 2014-09-27 ENCOUNTER — Ambulatory Visit (INDEPENDENT_AMBULATORY_CARE_PROVIDER_SITE_OTHER): Payer: PRIVATE HEALTH INSURANCE | Admitting: Family Medicine

## 2014-09-27 ENCOUNTER — Encounter: Payer: Self-pay | Admitting: Family Medicine

## 2014-09-27 ENCOUNTER — Telehealth: Payer: Self-pay | Admitting: Pulmonary Disease

## 2014-09-27 VITALS — BP 140/82 | HR 87 | Temp 98.3°F | Ht 71.0 in | Wt 223.5 lb

## 2014-09-27 DIAGNOSIS — J3089 Other allergic rhinitis: Secondary | ICD-10-CM

## 2014-09-27 DIAGNOSIS — E785 Hyperlipidemia, unspecified: Secondary | ICD-10-CM

## 2014-09-27 DIAGNOSIS — R413 Other amnesia: Secondary | ICD-10-CM

## 2014-09-27 DIAGNOSIS — I10 Essential (primary) hypertension: Secondary | ICD-10-CM

## 2014-09-27 NOTE — Assessment & Plan Note (Signed)
Stop  singulair as not working. Avoid allergens at work, wear a mask.

## 2014-09-27 NOTE — Patient Instructions (Addendum)
Stop singulair. Restart statin. Call if interested in referral to counselor or about medication to treat mood. Follow up in 3 months with labs prior.

## 2014-09-27 NOTE — Progress Notes (Signed)
Pre visit review using our clinic review tool, if applicable. No additional management support is needed unless otherwise documented below in the visit note. 

## 2014-09-27 NOTE — Telephone Encounter (Signed)
-----   Message from Carter Kitten, [facility] sent at 09/27/2014 10:35 AM EST ----- Regarding: Cancellation of Order # [id number] Order number [id number] for the procedure PNEUMOCOCCAL  POLYSACCHARIDE VACCINE 23-VALENT =>58YO SQ IM [IMM53] has been  canceled by Carter Kitten, [place] [[id number]]. This procedure  was ordered by Kathee Delton, MD [place] on Sep 11, 2012 for the  patient Cody Day [[id number]]. The reason for cancellation  was "None".

## 2014-09-27 NOTE — Telephone Encounter (Signed)
Why is the message coming to me from 2013.  Please do something with this.  Thanks.

## 2014-09-27 NOTE — Assessment & Plan Note (Signed)
Borderline control on benicar. HAs not yet helped symptom of post nasal drip being off ACEI but will give more time.

## 2014-09-27 NOTE — Assessment & Plan Note (Signed)
No clear dementia seen per neuropsych testing. Issues may stem from anxiety and depression symptoms from adjustment disorder. Pt refuses med to treat or counseling at this time. Will reconsider in future depending on neuro eval.

## 2014-09-27 NOTE — Assessment & Plan Note (Signed)
Last check, not at goal. Trial of statin did not help muscle ache. Pt will restart statin.

## 2014-09-27 NOTE — Progress Notes (Signed)
 Subjective:    Patient ID: Cody Day, male    DOB: 03/23/1956, 58 y.o.   MRN: [id number]  HPI  58 year old amle presents for 3 month follow up multiple issues.  Elevated Cholesterol:  LDL not at goal < 70. Had trial off statin to see if muscle pain resolved. It has not../. He has not been on  Statin for a while. Lab Results  Component Value Date   CHOL 187 06/22/2014   HDL 77.60 06/22/2014   LDLCALC 94 06/22/2014   LDLDIRECT 127.4 06/23/2009   TRIG 79.0 06/22/2014   CHOLHDL 2 06/22/2014  Using medications without problems:None Muscle aches: Yes Diet compliance: Moderate Exercise: None Other complaints:  Hypertension:  Borderline control  now on benicar instead of lisinopril given ? Post nasal drip (changed less than 1 month ago by Pulm.   BP Readings from Last 3 Encounters:  09/27/14 140/82  09/06/14 130/80  07/26/14 140/86  Using medication without problems or lightheadedness:  Chest pain with exertion:None Edema:None Short of breath:yes Average home BPs: 128/78 Other issues:   Memory loss, tremor, neuropathy: Saw neurologist Dr. Aquino 10/16.   Reviewed OV note: Bloodwork for TSH, RPR, HIV, B12, heavy metal screen, serum copper, ceruloplasmin, ANA, ESR, CRP, CPK, SPEP/IFE will be ordered today. His MOCA score today is 29/30. MRI brain unremarkable. We discussed memory issues suggestive of mild cognitive impairment, there may be a component of depression, he will benefit from neuropsychological evaluation to further delineate this. We discussed treatment options for tremor, I am hesitant to start a beta-blocker due to his pulmonary issues. Primidone may worsen cognitive deficits. He may be a candidate for DBS and consideration for referral to our Movement Disorder specialist Dr. Tat will be done after the tests.  Has pending EEG this week.  Had neuropshy testing in 08/2014: showed no specific finding suggesting a pattern of dementia. He does have  Adjustment disorder  with depression and anxiety.  He does have some issue with insomnia.   Singulair  Has not helped much with post nasal drip.                     Review of Systems  Constitutional: Positive for fatigue. Negative for fever.  HENT: Negative for ear pain.   Eyes: Negative for pain.  Respiratory: Positive for shortness of breath. Negative for wheezing.   Cardiovascular: Negative for palpitations and leg swelling.  Gastrointestinal: Negative for abdominal pain.       Objective:   Physical Exam  Constitutional: Vital signs are normal. He appears well-developed and well-nourished.  HENT:  Head: Normocephalic.  Right Ear: Hearing normal.  Left Ear: Hearing normal.  Nose: Nose normal.  Mouth/Throat: Oropharynx is clear and moist and mucous membranes are normal.  Neck: Trachea normal. Carotid bruit is not present. No thyroid mass and no thyromegaly present.  Cardiovascular: Normal rate, regular rhythm and normal pulses.  Exam reveals no gallop, no distant heart sounds and no friction rub.   No murmur heard. No peripheral edema  Pulmonary/Chest: Effort normal and breath sounds normal. No respiratory distress.  Skin: Skin is warm, dry and intact. No rash noted.  Psychiatric: He has a normal mood and affect. His speech is normal and behavior is normal. Thought content normal. Cognition and memory are not impaired. He does not express impulsivity or inappropriate judgment. He expresses no suicidal plans and no homicidal plans. He exhibits normal recent memory and normal remote memory.            Assessment & Plan:

## 2014-09-29 ENCOUNTER — Ambulatory Visit (INDEPENDENT_AMBULATORY_CARE_PROVIDER_SITE_OTHER): Payer: PRIVATE HEALTH INSURANCE | Admitting: Neurology

## 2014-09-29 DIAGNOSIS — G5602 Carpal tunnel syndrome, left upper limb: Secondary | ICD-10-CM

## 2014-09-29 DIAGNOSIS — R251 Tremor, unspecified: Secondary | ICD-10-CM

## 2014-09-29 DIAGNOSIS — M5412 Radiculopathy, cervical region: Secondary | ICD-10-CM

## 2014-09-29 DIAGNOSIS — M5417 Radiculopathy, lumbosacral region: Secondary | ICD-10-CM

## 2014-09-29 DIAGNOSIS — R413 Other amnesia: Secondary | ICD-10-CM

## 2014-09-29 DIAGNOSIS — G629 Polyneuropathy, unspecified: Secondary | ICD-10-CM

## 2014-09-29 NOTE — Procedures (Signed)
[facility]  [address]  [place], [place] [address] Tel: [phone] Fax:  [phone] Test Date:  09/29/2014  Patient: Cody Day DOB: [birth year] Physician: Narda Amber  Sex: Male Height: 5\' 11"  Ref Phys: Ellouise Newer  ID#: [id number] Temp: 33.8C Technician:    Patient Complaints: This is a 58 year-old gentleman presenting for evaluation of paresthesias of the hands and feet, worse on the left.  NCV & EMG Findings: Extensive electrodiagnostic testing of the left upper and lower extremity shows: 1. Left median sensory response is prolonged and reduced in amplitude. The ulnar and radial sensory responses are within normal limits. 2. Left median motor response is prolonged and reduced in amplitude. The ulnar motor responses within normal limits. 3. Left sural and superficial peroneal sensory responses are within normal limits. 4. Left tibial and peroneal motor responses are within normal limits. 5. Chronic motor axon loss changes are seen affecting the C8 and L5 myotomes on the left, without accompanied active denervation.   Impression: 1. Left median neuropathy at or distal to the wrist, consistent with the clinical diagnosis of carpal tunnel syndrome. Overall, these findings are moderate to severe in degree electrically. 2. Chronic C8 radiculopathy affecting the left upper extremity.  3. Chronic L5 radiculopathy affecting the left lower extremity.  4. There is no evidence of a large fiber generalized sensorimotor polyneuropathy affecting the left side.  A small fiber neuropathy cannot be excluded by this study.   ___________________________ Narda Amber    Nerve Conduction Studies Anti Sensory Summary Table   Stim Site NR Peak (ms) Norm Peak (ms) P-T Amp (V) Norm P-T Amp  Left Median Anti Sensory (2nd Digit)  Wrist    3.9 <3.6 12.1 >15  Left Radial Anti Sensory (Base 1st Digit)  Wrist    1.9 <2.7 23.6 >14  Left Sup Peroneal Anti Sensory (Ant  Lat Mall)  12 cm    2.6 <4.6 8.7 >4  Left Sural Anti Sensory (Lat Mall)  Calf    3.3 <4.6 13.3 >4  Left Ulnar Anti Sensory (5th Digit)  Wrist    3.0 <3.1 12.2 >10   Motor Summary Table   Stim Site NR Onset (ms) Norm Onset (ms) O-P Amp (mV) Norm O-P Amp Site1 Site2 Delta-0 (ms) Dist (cm) Vel (m/s) Norm Vel (m/s)  Left Median Motor (Abd Poll Brev)  Wrist    4.2 <4.0 5.8 >6 Elbow Wrist 5.5 32.0 58 >50  Elbow    9.7  5.4         Erbs    13.8  0.0         Left Peroneal Motor (Ext Dig Brev)  Ankle    4.8 <6.0 3.4 >2.5 B Fib Ankle 7.9 39.0 49 >40  B Fib    12.7  2.6  Poplt B Fib 2.2 10.0 45 >40  Poplt    14.9  2.4         Left Peroneal TA Motor (Tib Ant)  Fib Head    2.0 <4.5 7.8 >3 Poplit Fib Head 2.1 10.0 48 >40  Poplit    4.1  7.2         Left Tibial Motor (Abd Hall Brev)  Ankle    5.3 <6.0 12.7 >4 Knee Ankle 9.2 43.0 47 >40  Knee    14.5  9.5         Left Ulnar Motor (Abd Dig Minimi)  Wrist    2.7 <3.1 9.0 >7 B  Elbow Wrist 4.9 28.0 57 >50  B Elbow    7.6  8.8  A Elbow B Elbow 1.6 10.0 63 >50  A Elbow    9.2  8.7          H Reflex Studies   NR H-Lat (ms) Lat Norm (ms) L-R H-Lat (ms)  Left Tibial (Gastroc)     38.10 <35 0.00  Right Tibial (Gastroc)     38.10 <35 0.00   EMG   Side Muscle Ins Act Fibs Psw Fasc Number Recrt Dur Dur. Amp Amp. Poly Poly. Comment  Left AntTibialis Nml Nml Nml Nml 1- Rapid Some 1+ Some 1+ Nml Nml N/A  Left 1stDorInt Nml Nml Nml Nml 1- Mod-R Few 1+ Few 1+ Nml Nml N/A  Left Abd Poll Brev Nml Nml Nml Nml Nml Nml Nml Nml Nml Nml Nml Nml N/A  Left Biceps Nml Nml Nml Nml Nml Nml Nml Nml Nml Nml Nml Nml N/A  Left Ext Indicis Nml Nml Nml Nml 1- Mod-R Few 1+ Few 1+ Nml Nml N/A  Left PronatorTeres Nml Nml Nml Nml 1- Mod Nml Nml Nml Nml Nml Nml N/A  Left Triceps Nml Nml Nml Nml 1- Rapid Few 1+ Some 1+ Nml Nml N/A  Left Gastroc Nml Nml Nml Nml Nml Nml Nml Nml Nml Nml Nml Nml N/A  Left Flex Dig Long Nml Nml Nml Nml 2- Rapid Some 1+ Some 1+ Nml Nml N/A  Left  RectFemoris Nml Nml Nml Nml Nml Nml Nml Nml Nml Nml Nml Nml N/A  Left GluteusMed Nml Nml Nml Nml 1- Mod-R Some 1+ Some 1+ Some 1+ N/A      Waveforms:

## 2014-11-08 ENCOUNTER — Telehealth: Payer: Self-pay | Admitting: Neurology

## 2014-11-08 NOTE — Telephone Encounter (Signed)
Results of Neuropsych testing done 08/24/14:  Impression: Cognitive Disoder, unspecified Alcohol abuse, chronic Adjustment disorder with mixed anxiety and depressed mood Rule out alcohol dependence.  He demonstrated variable attention and slowed speed of information processing on some measures, the pattern of which could suggest underlying small vessel cerebrovascular disease given his medical history. However, Cody Day neuroimaging is normal. Cody Day appears to meet criteria for a diagnosis of adjustment disorder with anxiety and depressed mood as well as alcohol abuse at this time. Most likely contributing factors to Cody Day cognitive difficulties are untreated obstructive sleep apnea, insomnia, chronic alcohol abuse, anxiety and depression, as well as increased risk of cognitive difficulties secondary to prior history of polysubstance abuse.  He demonstrates no specific pattern which would support a diagnosis of dementia at this time and some of his difficulties on current testing are likely related to performance anxiety.   Recommendations: 1. Strategies to maintain brain health 2. Alcohol cessation and psychotherapy 3. Followup with sleep medicine 4. Strategies to manage anxiety and inattention 5. Consider psychotropic medication. He may benefit from seeing psychiatry to address depression and anxiety (eg SSRI)

## 2014-12-11 ENCOUNTER — Encounter (HOSPITAL_BASED_OUTPATIENT_CLINIC_OR_DEPARTMENT_OTHER): Payer: PRIVATE HEALTH INSURANCE

## 2014-12-22 ENCOUNTER — Telehealth: Payer: Self-pay | Admitting: Family Medicine

## 2014-12-22 ENCOUNTER — Other Ambulatory Visit: Payer: PRIVATE HEALTH INSURANCE

## 2014-12-22 DIAGNOSIS — E785 Hyperlipidemia, unspecified: Secondary | ICD-10-CM

## 2014-12-22 NOTE — Telephone Encounter (Signed)
-----   Message from Ellamae Sia sent at 12/14/2014 11:24 AM EST ----- Regarding: Lab orders for Thursday, 3.3.16 Lab orders for a 3 month fasting lab

## 2014-12-23 ENCOUNTER — Other Ambulatory Visit (INDEPENDENT_AMBULATORY_CARE_PROVIDER_SITE_OTHER): Payer: PRIVATE HEALTH INSURANCE

## 2014-12-23 DIAGNOSIS — E785 Hyperlipidemia, unspecified: Secondary | ICD-10-CM

## 2014-12-23 LAB — COMPREHENSIVE METABOLIC PANEL
ALT: 26 U/L (ref 0–53)
AST: 29 U/L (ref 0–37)
Albumin: 4.3 g/dL (ref 3.5–5.2)
Alkaline Phosphatase: 54 U/L (ref 39–117)
BUN: 11 mg/dL (ref 6–23)
CO2: 30 mEq/L (ref 19–32)
Calcium: 9.4 mg/dL (ref 8.4–10.5)
Chloride: 102 mEq/L (ref 96–112)
Creatinine, Ser: 0.87 mg/dL (ref 0.40–1.50)
GFR: 95.76 mL/min (ref >60.00)
Glucose, Bld: 114 mg/dL — ABNORMAL HIGH (ref 70–99)
Potassium: 4.3 mEq/L (ref 3.5–5.1)
Sodium: 138 mEq/L (ref 135–145)
Total Bilirubin: 0.8 mg/dL (ref 0.2–1.2)
Total Protein: 7.1 g/dL (ref 6.0–8.3)

## 2014-12-23 LAB — LIPID PANEL
Cholesterol: 179 mg/dL (ref 0–200)
HDL: 89.5 mg/dL (ref >39.00)
LDL Cholesterol: 67 mg/dL (ref 0–99)
NonHDL: 89.5
Total CHOL/HDL Ratio: 2
Triglycerides: 115 mg/dL (ref 0.0–149.0)
VLDL: 23 mg/dL (ref 0.0–40.0)

## 2014-12-27 ENCOUNTER — Encounter: Payer: Self-pay | Admitting: Family Medicine

## 2014-12-27 ENCOUNTER — Ambulatory Visit (INDEPENDENT_AMBULATORY_CARE_PROVIDER_SITE_OTHER): Payer: PRIVATE HEALTH INSURANCE | Admitting: Family Medicine

## 2014-12-27 ENCOUNTER — Ambulatory Visit: Payer: PRIVATE HEALTH INSURANCE | Admitting: Family Medicine

## 2014-12-27 VITALS — BP 128/80 | HR 90 | Temp 98.5°F | Ht 71.0 in | Wt 215.5 lb

## 2014-12-27 DIAGNOSIS — R7309 Other abnormal glucose: Secondary | ICD-10-CM

## 2014-12-27 DIAGNOSIS — R7303 Prediabetes: Secondary | ICD-10-CM

## 2014-12-27 DIAGNOSIS — E785 Hyperlipidemia, unspecified: Secondary | ICD-10-CM

## 2014-12-27 DIAGNOSIS — I10 Essential (primary) hypertension: Secondary | ICD-10-CM

## 2014-12-27 NOTE — Patient Instructions (Addendum)
Can try flonase OTC 2 spray per nostril daily nose for nasal congestion.  Work on [employer], low carb diet.- Exercise as able, walk daily.

## 2014-12-27 NOTE — Assessment & Plan Note (Signed)
Worsened control. Counseled on low carb diet and healthy eating.

## 2014-12-27 NOTE — Progress Notes (Signed)
Pre visit review using our clinic review tool, if applicable. No additional management support is needed unless otherwise documented below in the visit note. 

## 2014-12-27 NOTE — Progress Notes (Signed)
59 year old male presents for 3 month follow up. Still having significant issues breathing. Coughs mucus up in Am.Veleta Miners mucus.  Followed by Dr. Lake Bells. Has follow up in June. Using Symbicortmax every daily and albuterol.  Elevated Cholesterol: LDL back at goal < 70 on simvastatin 20 mg daily Lab Results  Component Value Date   CHOL 179 12/23/2014   HDL 89.50 12/23/2014   LDLCALC 67 12/23/2014   LDLDIRECT 127.4 06/23/2009   TRIG 115.0 12/23/2014   CHOLHDL 2 12/23/2014  Using medications without problems:None Muscle aches: Yes Diet compliance: Moderate Exercise: walking Other complaints:  Hypertension: He is back on lisinopril as change to benicar did not help at all. BP Readings from Last 3 Encounters:  12/27/14 128/80  09/27/14 140/82  09/06/14 130/80  No SE to meds. Chest pain with exertion:None Edema:None Short of breath:yes Average home BPs: 128/78 Other issues:   Memory loss, tremor, neuropathy: Saw neurologist Dr. Delice Lesch 10/16.  Reviewed OV note: Bloodwork for TSH, RPR, HIV, B12, heavy metal screen, serum copper, ceruloplasmin, ANA, ESR, CRP, CPK, SPEP/IFE will be ordered today. His MOCA score today is 29/30. MRI brain unremarkable. We discussed memory issues suggestive of mild cognitive impairment, there may be a component of depression, he will benefit from neuropsychological evaluation to further delineate this. We discussed treatment options for tremor, I am hesitant to start a beta-blocker due to his pulmonary issues. Primidone may worsen cognitive deficits. He may be a candidate for DBS and consideration for referral to our Movement Disorder specialist Dr. Carles Collet will be done after the tests.  Has pending EEG this week.  Had neuropshy testing in 08/2014: showed no specific finding suggesting a pattern of dementia. He does have Adjustment disorder with depression and anxiety.  He does have some issue with insomnia.         Review of Systems   Constitutional: Positive for fatigue. Negative for fever.  HENT: Negative for ear pain.  Eyes: Negative for pain.  Respiratory: Positive for shortness of breath. Negative for wheezing.  Cardiovascular: Negative for palpitations and leg swelling.  Gastrointestinal: Negative for abdominal pain.       Objective:   Physical Exam  Constitutional: Vital signs are normal. He appears well-developed and well-nourished.  HENT:  Head: Normocephalic.  Right Ear: Hearing normal.  Left Ear: Hearing normal.  Nose: Nose normal.  Mouth/Throat: Oropharynx is clear and moist and mucous membranes are normal.  Neck: Trachea normal. Carotid bruit is not present. No thyroid mass and no thyromegaly present.  Cardiovascular: Normal rate, regular rhythm and normal pulses. Exam reveals no gallop, no distant heart sounds and no friction rub.  No murmur heard. No peripheral edema  Pulmonary/Chest: Effort normal and breath sounds normal. No respiratory distress.  Skin: Skin is warm, dry and intact. No rash noted.  Psychiatric: He has a normal mood and affect. His speech is normal and behavior is normal. Thought content normal. Cognition and memory are not impaired. He does not express impulsivity or inappropriate judgment. He expresses no suicidal plans and no homicidal plans. He exhibits normal recent memory and normal remote memory.          Assessment & Plan:

## 2014-12-27 NOTE — Assessment & Plan Note (Signed)
Well controlled. Continue current medication.  

## 2014-12-27 NOTE — Assessment & Plan Note (Signed)
Now back at goal on simvastatin 20 mg daily.

## 2015-01-06 ENCOUNTER — Other Ambulatory Visit: Payer: Self-pay | Admitting: Family Medicine

## 2015-02-10 NOTE — H&P (Signed)
PATIENT NAME:  Cody Day, Cody Day MR#:  [id number] DATE OF BIRTH:  1956/04/15  DATE OF ADMISSION:  10/23/2012  PRIMARY CARE PHYSICIAN: Eliezer Lofts, MD  CHIEF COMPLAINT: Tachypnea.  HISTORY OF PRESENT ILLNESS: This is a 59 year old male who reports having two week history of shortness of breath that has been getting progressively worse. He also complained of some pain in his left calf and has been swollen for several weeks. He has reported no wheezing, no chest discomfort. He does have sleep apnea, but says he is compliant on his CPAP at night. His work-up has been negative here in the ED, but he was found to be severely alkalotic on his blood gas. We are going to admit him for further treatment.   PAST MEDICAL HISTORY: 1. Hypertension.  2. Hyperlipidemia.  3. Low testosterone.  4. Sleep apnea. A. On CPAP at night.  5. Chronic obstructive pulmonary disease.  6. Coronary artery disease.   PAST SURGICAL HISTORY: 1. Achilles tendon repair. 2. Abdominal surgery. 3. Hernia repair.   ALLERGIES: PENICILLIN.   CURRENT MEDICATIONS:  1. Testosterone cream 50 mg q. weekly.  2. Simvastatin 40 mg daily.  3. Naproxen 220 mg 2 times a day p.r.n.  4. Multivitamin daily.  5. Hydrochlorothiazide 12.5 mg daily.  6. Glucosamine 500 mg 2 times a day. 7. Aspirin 81 mg daily.  8. Allopurinol 100 mg daily.  9. Advair 250/50 one puffs 2 times a day.  SOCIAL HISTORY: He has a history of drug use in the remote past with smoking methamphetamine and marijuana heavily. 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 He does not drink any alcohol.   FAMILY HISTORY: Denies any coronary artery disease or hypertension.   REVIEW OF SYSTEMS:    CONSTITUTIONAL: He has had no fever or chills.   EYES: No blurred vision.   ENT: No hearing loss.   CARDIOVASCULAR: No chest pain.   PULMONARY: Some shortness of breath.   GASTROINTESTINAL: No nausea, vomiting or diarrhea.   GENITOURINARY: No  dysuria.   ENDOCRINE: No heat or cold intolerance.   INTEGUMENTAL: No rash.   MUSCULOSKELETAL: He has had the left leg pain.   NEUROLOGIC: No numbness or weakness.   PHYSICAL EXAMINATION:  VITAL SIGNS: Temperature is 97.3, pulse 86, respirations 16 and blood pressure 134/82.   GENERAL: This is a mildly obese white male who is very tachypneic.   HEENT: Pupils are equal, round and reactive to light. Sclerae anicteric. Oral mucosa is moist. Oropharynx is clear.   NECK: Supple. No JVD, lymphadenopathy or thyromegaly.   CARDIOVASCULAR: Tachy with no murmurs.   PULMONARY: Lungs are clear to auscultation. No dullness to percussion. He is using accessory muscles.   ABDOMEN: Obese, nontender and nondistended. Bowel sounds are positive. No hepatosplenomegaly. No masses.   EXTREMITIES: There is 1+ left lower extremity edema. There is some mild tenderness in the left calf.   NEUROLOGIC: Cranial nerves II through XII are intact. He is alert and oriented x 4.   SKIN: Moist with no rash.   LABS/STUDIES: Chest x-ray shows no abnormalities.   Blood gas: pH is 7.62, pCO2 is 22 and pO2 is 106.  White blood cells is 6.8, hemoglobin 13.5, BUN 13, creatinine 1.07, sodium 136 and potassium 3.4. TSH is 2.58.   ASSESSMENT AND PLAN: 1. Respiratory alkalosis. This is caused by his tachypneic. I am not sure what is driving this at this point. The patient had a negative d-dimer, but  we will go ahead and CT the chest to rule out chronic PE.  We will also get echocardiogram to assess for pulmonary hypertension and any other abnormalities. At this point, he oxygenating okay so will not require any oxygenation. We will watch him for fatigue. 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   2. Tachypnea. Again, we will rule out underlying causes as above.  3. Hypertension. We will continue his current medications.  4. Sleep apnea. He is on CPAP. I have asked him to bring his machine in.   5. Chronic obstructive pulmonary disease. There is no wheezing, I do not believe this is an exacerbation. I will keep him on his inhalers, but at this point will not add steroids.   TIME SPENT ON ADMISSION: 45 minutes. ____________________________ Baxter Hire, MD [email or web address] D: 10/23/2012 16:51:44 ET T: 10/23/2012 17:14:31 ET JOB#: [id number]  cc: Baxter Hire, MD, <Dictator> [name], MD Baxter Hire MD ELECTRONICALLY SIGNED 10/23/2012 21:59

## 2015-02-10 NOTE — Discharge Summary (Signed)
PATIENT NAME:  Cody Day, Cody Day MR#:  [id number] DATE OF BIRTH:  [birth year]  DATE OF ADMISSION:  10/23/2012 DATE OF DISCHARGE:  10/24/2012  PRIMARY CARE PHYSICIAN: Eliezer Lofts, MD   PRESENTING COMPLAINT: Abnormal arterial blood gases.   DISCHARGE DIAGNOSES: 1. Respiratory alkalosis suspected from aspirin and anxiety.  2. History of hypertension.  3. Chronic obstructive pulmonary disease, stable, saturations 98% on room air.   MEDICATIONS: 1. Advair 250/50, 1 puff b.i.d.  2. Glucosamine 500 mg 2 tablets daily.  3. Hydrochlorothiazide/lisinopril 12.5/20, 1 tablet daily.  4. Multivitamin p.o. daily.  5. Simvastatin 40 mg every other day.  6. Testosterone 50 mg intramuscular once a week on Tuesdays.  7. Allopurinol 100 mg daily.  8. Naproxen 20 mg p.o. daily.  9. Ventolin HFA 1 puff b.i.d.  10. Ibuprofen 200 mg 2 tablets every 4 hours as needed.   NOTE: The patient was advised to hold his aspirin.   DIET: Low sodium diet.   ACTIVITY: As tolerated.   FOLLOWUP: With Dr. Diona Browner in 1 to 2 weeks.   CONSULTATION: Pulmonary with Dr. Devona Konig.    LABORATORY, DIAGNOSTIC AND RADIOLOGICAL DATA:  Echo Doppler systolic function mildly reduced, ejection fraction is around 35 to 45%. Trace mitral regurgitation and tricuspid regurgitation. Aortic valve not visualized properly.  CT of the chest is negative for PE.  Chest x-ray: No acute cardiopulmonary abnormality.   PH is 7.5, pCO2 is 31, pO2 101, bicarbonate is 24.2. CBC within normal limits except MCV of 72. Serum bicarbonate of 23.0. D-dimer within normal limits. B-type natriuretic peptide is 43, magnesium 1.8. TSH is 2.58.   BRIEF SUMMARY OF HOSPITAL COURSE: The patient is a 59 year old Caucasian gentleman with history of COPD, comes in with:   1. Acute respiratory alkalosis: This caused his tachypnea. Etiology initially remained unclear. 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  His shortness of breath improves, although he does have some on  exertion. The patient has been taking aspirin and other NSAIDs for the past some days secondary to his torn ligament in his extremity. It could be presumed because of that the patient is hyperventilating and also secondary to some anxiety. His work-up otherwise has been negative. Pulmonary consultation with Dr. Humphrey Rolls was obtained, who did have the same impression for respiratory alkalosis as above. Aspirin was held at this time.  2. Hypertension: His home meds were continued.  3. Sleep apnea: The patient is on CPAP.  4. Chronic obstructive pulmonary disease: There is no wheezing, and there is no evidence of any exacerbation.   The hospital stay otherwise remained stable. The patient will follow up with his primary care physician on an as-needed basis.   TIME SPENT:   40 minutes.  ____________________________ Hart Rochester Posey Pronto, MD sap:cb D: 10/25/2012 07:31:15 ET T: 10/25/2012 16:41:32 ET JOB#: [id number]  cc: Shellee Streng A. Posey Pronto, MD, <Dictator> [name], MD Ilda Basset MD ELECTRONICALLY SIGNED 10/30/2012 12:34

## 2015-03-08 ENCOUNTER — Ambulatory Visit (INDEPENDENT_AMBULATORY_CARE_PROVIDER_SITE_OTHER): Payer: PRIVATE HEALTH INSURANCE | Admitting: Internal Medicine

## 2015-03-08 VITALS — BP 126/80 | HR 96

## 2015-03-08 DIAGNOSIS — J4530 Mild persistent asthma, uncomplicated: Secondary | ICD-10-CM | POA: Diagnosis not present

## 2015-03-08 MED ORDER — DOXYCYCLINE HYCLATE 100 MG PO TABS: ORAL_TABLET | ORAL | Status: DC

## 2015-03-08 MED ORDER — PREDNISONE 10 MG PO TABS: 10.0000 mg | ORAL_TABLET | Freq: Every day | ORAL | Status: DC

## 2015-03-08 MED ORDER — ALBUTEROL SULFATE (2.5 MG/3ML) 0.083% IN NEBU
2.5000 mg | INHALATION_SOLUTION | Freq: Once | RESPIRATORY_TRACT | Status: DC
Start: 2015-03-08 — End: 2015-03-09

## 2015-03-08 NOTE — Assessment & Plan Note (Signed)
Patient has a form of asthma and chronic bronchitis which is likely due to the fact that he previously used a number of inhaled illicit substances. Based on Dr. Lake Bells assessment, chronic bronchitis may also contribute to his symptoms as well as allergic rhinitis.  His symptoms have been very well-controlled with Symbicort and a spacer up until recently. Currently I believe that he is having an exacerbation of his asthma\chronic bronchitis, unknown trigger -possible URI  Plan: -Continue Symbicort with a spacer -Prednisone 30mg  daily x 5 days -Doxycycline 100mg , 1 tab PO BID x 10 days -2 neb treatments in the office.

## 2015-03-08 NOTE — Progress Notes (Signed)
MRN# [id number] Cody Day [birth year]   CC: Chief Complaint  Patient presents with  . Acute Visit    BQ patient here for cough.Pt felt like he had sinus infection on 03/04/15. He noticed cough with chest tightess, wheeze and sob.      Brief History: Synopsis: Cody Day first saw the [facility] pulmonary clinic in the spring of 2015 for shortness of breath, cough, mucus production. He had lung function testing in 2014 which was completely normal. He had a past history significant for long-term inhaled illicit drug use with multiple substances including methamphetamine.  09/06/2014 ROV> Nuchem states that his breathing has been about the same since the last visit. He has minimal shortness of breath on exertion and is capable of doing whatever he needs to do during the daytime without too much difficulty. However, he does note significant amounts of grey to yellow sputum which he produces throughout the course of the morning. Typically this is worse in the mornings and is quite thick. He says he coughs up a significant amount when he is in the shower. This will persist for about 2 hours and then throughout the rest of the day he does not have much problem with mucus production. However, he does have a scratchy throat throughout the course of the day and he frequently clears his throat. He continues to use his Symbicort regularly. He rinses his mouth after using it. He has recently undergone neuro psychology testing for short-term memory loss. He tells me that he has not been using his CPAP machine since losing a significant amount of weight. His wife says that he does not snore nor does she witnessed apneas anymore.  Asthma This has been a stable interval for Syris with his asthma. However, he continues to have a significant amount of mucus in his throat in the mornings which she attributes to the poor air quality in his work environment.  I explained to him today that some component of  this may be the lisinopril as people on that often do experience an itchy, scratchy throat. However, the most likely scenario is that he has asthma with a chronic bronchitis phenotype which is exacerbated by all the wood dust in the air in his environment.  Plan: -Continue Symbicort -Try switching off of lisinopril to Benicar for 2 months -Flu shot today -Followup 6 months  Events since last clinic visit: Patient presents today for an acute care visit, has a history of chronic bronchitis and asthma. Patient states since Saturday he's had increased cough, productive of grayish to greenish sputum, fatigue, increased shortness of breath. He denies any new triggers, any sick contacts. He denies any fever, night sweats, or chills. Patient states that he's been using his albuterol inhaler at least twice a day since Saturday. He denies any recent tobacco exposure or use of any tobacco products.      Medication:   Current Outpatient Rx  Name  Route  Sig  Dispense  Refill  . albuterol (PROVENTIL HFA;VENTOLIN HFA) 108 (90 BASE) MCG/ACT inhaler   Inhalation   Inhale 1 puff into the lungs 2 (two) times daily as needed. Shortness of breath   8.5 g   5   . allopurinol (ZYLOPRIM) 100 MG tablet      TAKE ONE (1) TABLET EACH DAY   30 tablet   11   . B Complex-C (SUPER B COMPLEX PO)   Oral   Take 1 tablet by mouth daily.         Marland Kitchen  Cinnamon 500 MG capsule   Oral   Take 500 mg by mouth 2 (two) times daily.         . Garlic 433 MG TABS   Oral   Take 1 tablet by mouth daily.         Marland Kitchen lisinopril (PRINIVIL,ZESTRIL) 20 MG tablet      TAKE ONE (1) TABLET EACH DAY   30 tablet   11   . Multiple Vitamins-Minerals (MULTIVITAL) tablet   Oral   Take 1 tablet by mouth daily.           . Omega-3 Fatty Acids (FISH OIL) 1000 MG CAPS   Oral   Take 1,000 mg by mouth daily.         . simvastatin (ZOCOR) 20 MG tablet      TAKE ONE TABLET AT BEDTIME   30 tablet   11   .  Spacer/Aero-Holding Chambers (AEROCHAMBER MV) inhaler      Use as instructed   1 each   0   . SYMBICORT 160-4.5 MCG/ACT inhaler      USE 2 PUFFS TWICE DAILY   10.2 g   11   . testosterone cypionate (DEPOTESTOTERONE CYPIONATE) 100 MG/ML injection   Intramuscular   Inject 100 mg into the muscle every 7 (seven) days. On Fridays only uses 1/2 ml  For IM use only         . tiZANidine (ZANAFLEX) 4 MG capsule   Oral   Take 2 mg by mouth as needed for muscle spasms.            Review of Systems: Gen:  Denies  fever, sweats, chills HEENT: Denies blurred vision, double vision, ear pain, eye pain, hearing loss, nose bleeds, sore throat Cvc:  No dizziness, chest pain or heaviness Resp:   Admits to: Productive cough, shortness of breath, wheezing. Gi: Denies swallowing difficulty, stomach pain, nausea or vomiting, diarrhea, constipation, bowel incontinence Gu:  Denies bladder incontinence, burning urine Ext:   No Joint pain, stiffness or swelling Skin: No skin rash, easy bruising or bleeding or hives Endoc:  No polyuria, polydipsia , polyphagia or weight change Other:  All other systems negative  Allergies:  Penicillins  Physical Examination:  VS: BP 126/80 mmHg  Pulse 96  SpO2 96%  General Appearance: No distress  HEENT: PERRLA, no ptosis, no other lesions noticed Pulmonary: Prebronchodilator: Coarse upper airway sounds, diffuse wheezing especially at the bases, no rales or crackles. Postbronchodilator: Improved breath sounds especially at the bases, mild wheezing noted, improved air movement, coarse upper airway sounds. Cardiovascular:  Normal S1,S2.  No m/r/g.     Abdomen:Exam: Benign, Soft, non-tender, No masses  Skin:   warm, no rashes, no ecchymosis  Extremities: normal, no cyanosis, clubbing, warm with normal capillary refill.       Assessment and Plan: Asthma, chronic Patient has a form of asthma and chronic bronchitis which is likely due to the fact that he  previously used a number of inhaled illicit substances. Based on Dr. Lake Bells assessment, chronic bronchitis may also contribute to his symptoms as well as allergic rhinitis.  His symptoms have been very well-controlled with Symbicort and a spacer up until recently. Currently I believe that he is having an exacerbation of his asthma\chronic bronchitis, unknown trigger -possible URI  Plan: -Continue Symbicort with a spacer -Prednisone 30mg  daily x 5 days -Doxycycline 100mg , 1 tab PO BID x 10 days -2 neb treatments in the office.  Updated Medication List Outpatient Encounter Prescriptions as of 03/08/2015  Medication Sig  . albuterol (PROVENTIL HFA;VENTOLIN HFA) 108 (90 BASE) MCG/ACT inhaler Inhale 1 puff into the lungs 2 (two) times daily as needed. Shortness of breath  . allopurinol (ZYLOPRIM) 100 MG tablet TAKE ONE (1) TABLET EACH DAY  . B Complex-C (SUPER B COMPLEX PO) Take 1 tablet by mouth daily.  . Cinnamon 500 MG capsule Take 500 mg by mouth 2 (two) times daily.  . Garlic 914 MG TABS Take 1 tablet by mouth daily.  Marland Kitchen lisinopril (PRINIVIL,ZESTRIL) 20 MG tablet TAKE ONE (1) TABLET EACH DAY  . Multiple Vitamins-Minerals (MULTIVITAL) tablet Take 1 tablet by mouth daily.    . Omega-3 Fatty Acids (FISH OIL) 1000 MG CAPS Take 1,000 mg by mouth daily.  . simvastatin (ZOCOR) 20 MG tablet TAKE ONE TABLET AT BEDTIME  . Spacer/Aero-Holding Chambers (AEROCHAMBER MV) inhaler Use as instructed  . SYMBICORT 160-4.5 MCG/ACT inhaler USE 2 PUFFS TWICE DAILY  . testosterone cypionate (DEPOTESTOTERONE CYPIONATE) 100 MG/ML injection Inject 100 mg into the muscle every 7 (seven) days. On Fridays only uses 1/2 ml  For IM use only  . tiZANidine (ZANAFLEX) 4 MG capsule Take 2 mg by mouth as needed for muscle spasms.   No facility-administered encounter medications on file as of 03/08/2015.    Orders for this visit: No orders of the defined types were placed in this encounter.    Thank  you for  the visitation and for allowing  [facility] to assist in the care of your patient. Our recommendations are noted above.  Please contact us if we can be of further service.  Vilinda Boehringer, MD [facility] Office Number: [phone]

## 2015-03-08 NOTE — Patient Instructions (Signed)
Follow up with Dr. Lake Bells in 2 weeks - you are being treated today for a COPD exacerbation, unknown trigger - Prednisone 30 mg - 1 tab daily with breakfast - Doxycycline (100nmg) - 1 tab in the am and 1 tab in the PM x 10 days - continue with your regular inhalers - keep your appointment with Dr. Lake Bells on 03/22/15

## 2015-03-22 ENCOUNTER — Ambulatory Visit (INDEPENDENT_AMBULATORY_CARE_PROVIDER_SITE_OTHER): Payer: PRIVATE HEALTH INSURANCE | Admitting: Pulmonary Disease

## 2015-03-22 ENCOUNTER — Encounter: Payer: Self-pay | Admitting: Pulmonary Disease

## 2015-03-22 VITALS — BP 134/80 | HR 85 | Temp 98.4°F | Ht 71.0 in | Wt 212.0 lb

## 2015-03-22 DIAGNOSIS — R0602 Shortness of breath: Secondary | ICD-10-CM

## 2015-03-22 DIAGNOSIS — J4541 Moderate persistent asthma with (acute) exacerbation: Secondary | ICD-10-CM

## 2015-03-22 DIAGNOSIS — R0600 Dyspnea, unspecified: Secondary | ICD-10-CM | POA: Insufficient documentation

## 2015-03-22 NOTE — Assessment & Plan Note (Signed)
Dyspnea has been worsening lately.  He recently had a flare of his asthma which was successfully treated by my partner with prednisone and doxycycline. However, he notes that he has gradually been having increasing shortness of breath for the last several months. On exam he has almost like 3 small type respirations. I've reviewed his most recent lab work which shows no evidence of a metabolic acidosis. However, his records do note that in 2015 he was hospitalized for "respiratory alkalosis associated with anxiety and or NSAID intake". He has not had any NSAIDs of any kind in the last several weeks.  I do have a concern for anxiety, however considering his chest congestion and cough I think we need to look into this a little bit more before we write it off as anxiety. Further, he may need to have another cardiac stress test if his lung studies are normal.  Plan: Pulmonary function test and CT scan of the chest Continue taking Symbicort If PFT and CT scan are normal then we will arrange a cardiac stress test Follow-up one month

## 2015-03-22 NOTE — Patient Instructions (Signed)
We will arrange a CT scan of your chest in a pulmonary function testing call you with the results If this test do not show significant abnormality with your lungs then we will order a cardiac stress test We will see you back in one month or sooner if needed

## 2015-03-22 NOTE — Assessment & Plan Note (Signed)
As detailed above 

## 2015-03-22 NOTE — Progress Notes (Signed)
Subjective:    Patient ID: Cody Day, male    DOB: 02-01-1956, 59 y.o.   MRN: [id number]  Synopsis: Cody Day first saw the [facility] pulmonary clinic in the spring of 2015 for shortness of breath, cough, mucus production. He had lung function testing in 2014 which was completely normal. He had a past history significant for long-term inhaled illicit drug use with multiple substances including methamphetamine.  HPI Chief Complaint  Patient presents with  . Follow-up    Pt saw Cody Day on 03/08/15. He was given Doxycyline/Prednisone. Pt completed abx and still has some Prednisone. Pt says he has am cough/wheezing. He stays winded and has noticed hyperventilation.   He is better but still not great.  Prednisone and doxycycline helped. Feels that breathing is worse since November however, slowly getting worse.  He still has mucus production in the mornings which is the same. He continues to loose weight, about 40 pounds in the last year.  He has not really been trying to lose weight.   Still has a lot of sinus congestion No changes in dusty environment at work.    Past Medical History  Diagnosis Date  . Hypertension   . Asthma   . Gout   . Syncope 10/11    in settin gof asthma exacerbation w coughing. Ech (10/11): EF > 55%, mild LVH, grade I diastolic dysfunction, nomral RV size and systolic function,. normal valves. Carotid US (10/11): minimal disease  . Achilles tendon rupture 10/11    and repair  . COPD (chronic obstructive pulmonary disease)   . Sleep apnea   . Hemorrhoids        Review of Systems  Constitutional: Negative for fever, chills and fatigue.  HENT: Negative for postnasal drip, rhinorrhea and sinus pressure.   Respiratory: Positive for shortness of breath. Negative for cough and wheezing.   Cardiovascular: Negative for chest pain and palpitations.       Objective:   Physical Exam Filed Vitals:   03/22/15 0942  BP: 134/80  Pulse: 85  Temp:  98.4 F (36.9 C)  TempSrc: Oral  Height: 5\' 11"  (1.803 m)  Weight: 212 lb (96.163 kg)  SpO2: 98%   RA  Gen: well appearing, no acute distress HEENT: NCAT, EOMi, OP clear, PULM: CTA B CV: RRR, no mgr, no JVD AB: BS+, soft, nontender, no hsm Ext: warm, no edema, no clubbing, no cyanosis  His most recent primary care visit with lab work were reviewed, there is no evidence of a metabolic acidosis 1224 primary care notes were reviewed where was noted that he was hospitalized for respiratory alkalosis associated with NSAID intake and anxiety    Assessment & Plan:   Asthma, chronic Dyspnea has been worsening lately.  He recently had a flare of his asthma which was successfully treated by my partner with prednisone and doxycycline. However, he notes that he has gradually been having increasing shortness of breath for the last several months. On exam he has almost like 3 small type respirations. I've reviewed his most recent lab work which shows no evidence of a metabolic acidosis. However, his records do note that in 2015 he was hospitalized for "respiratory alkalosis associated with anxiety and or NSAID intake". He has not had any NSAIDs of any kind in the last several weeks.  I do have a concern for anxiety, however considering his chest congestion and cough I think we need to look into this a little bit more before we  write it off as anxiety. Further, he may need to have another cardiac stress test if his lung studies are normal.  Plan: Pulmonary function test and CT scan of the chest Continue taking Symbicort If PFT and CT scan are normal then we will arrange a cardiac stress test Follow-up one month   Dyspnea As detailed above     Updated Medication List Outpatient Encounter Prescriptions as of 03/22/2015  Medication Sig  . albuterol (PROVENTIL HFA;VENTOLIN HFA) 108 (90 BASE) MCG/ACT inhaler Inhale 1 puff into the lungs 2 (two) times daily as needed. Shortness of breath  .  allopurinol (ZYLOPRIM) 100 MG tablet TAKE ONE (1) TABLET EACH DAY  . B Complex-C (SUPER B COMPLEX PO) Take 1 tablet by mouth daily.  . Cinnamon 500 MG capsule Take 500 mg by mouth 2 (two) times daily.  . Garlic 093 MG TABS Take 1 tablet by mouth daily.  Marland Kitchen lisinopril (PRINIVIL,ZESTRIL) 20 MG tablet TAKE ONE (1) TABLET EACH DAY  . Multiple Vitamins-Minerals (MULTIVITAL) tablet Take 1 tablet by mouth daily.    . Omega-3 Fatty Acids (FISH OIL) 1000 MG CAPS Take 1,000 mg by mouth daily.  . predniSONE (DELTASONE) 10 MG tablet Take 1 tablet (10 mg total) by mouth daily with breakfast.  . simvastatin (ZOCOR) 20 MG tablet TAKE ONE TABLET AT BEDTIME  . Spacer/Aero-Holding Chambers (AEROCHAMBER MV) inhaler Use as instructed  . SYMBICORT 160-4.5 MCG/ACT inhaler USE 2 PUFFS TWICE DAILY  . testosterone cypionate (DEPOTESTOTERONE CYPIONATE) 100 MG/ML injection Inject 100 mg into the muscle every 7 (seven) days. On Fridays only uses 1/2 ml  For IM use only  . tiZANidine (ZANAFLEX) 4 MG capsule Take 2 mg by mouth as needed for muscle spasms.  . [DISCONTINUED] doxycycline (VIBRA-TABS) 100 MG tablet Take 1 tablet every morning and 1 tablet every evening for 10 days. (Patient not taking: Reported on 03/22/2015)   No facility-administered encounter medications on file as of 03/22/2015.

## 2015-03-23 ENCOUNTER — Ambulatory Visit (INDEPENDENT_AMBULATORY_CARE_PROVIDER_SITE_OTHER): Payer: PRIVATE HEALTH INSURANCE | Admitting: Pulmonary Disease

## 2015-03-23 ENCOUNTER — Telehealth: Payer: Self-pay

## 2015-03-23 ENCOUNTER — Ambulatory Visit
Admission: RE | Admit: 2015-03-23 | Discharge: 2015-03-23 | Disposition: A | Discharge status: Home / Self Care | Payer: PRIVATE HEALTH INSURANCE | Source: Ambulatory Visit | Attending: Pulmonary Disease | Admitting: Pulmonary Disease

## 2015-03-23 ENCOUNTER — Other Ambulatory Visit: Payer: Self-pay | Admitting: *Deleted

## 2015-03-23 DIAGNOSIS — J4541 Moderate persistent asthma with (acute) exacerbation: Secondary | ICD-10-CM

## 2015-03-23 DIAGNOSIS — R0602 Shortness of breath: Secondary | ICD-10-CM | POA: Diagnosis not present

## 2015-03-23 DIAGNOSIS — Z79899 Other long term (current) drug therapy: Secondary | ICD-10-CM | POA: Insufficient documentation

## 2015-03-23 DIAGNOSIS — I7090 Unspecified atherosclerosis: Secondary | ICD-10-CM | POA: Insufficient documentation

## 2015-03-23 DIAGNOSIS — I2510 Atherosclerotic heart disease of native coronary artery without angina pectoris: Secondary | ICD-10-CM | POA: Diagnosis not present

## 2015-03-23 LAB — PULMONARY FUNCTION TEST
DL/VA % pred: 117 %
DL/VA: 5.51 ml/min/mmHg/L
DLCO unc % pred: 122 %
DLCO unc: 41.19 ml/min/mmHg
FEF 25-75 Post: 3.11 L/sec
FEF 25-75 Pre: 1.91 L/sec
FEF2575-%Change-Post: 63 %
FEF2575-%Pred-Post: 98 %
FEF2575-%Pred-Pre: 60 %
FEV1-%Change-Post: 16 %
FEV1-%Pred-Post: 88 %
FEV1-%Pred-Pre: 75 %
FEV1-Post: 3.35 L
FEV1-Pre: 2.88 L
FEV1FVC-%Change-Post: 6 %
FEV1FVC-%Pred-Pre: 87 %
FEV6-%Change-Post: 9 %
FEV6-%Pred-Post: 99 %
FEV6-%Pred-Pre: 90 %
FEV6-Post: 4.73 L
FEV6-Pre: 4.33 L
FEV6FVC-%Change-Post: 0 %
FEV6FVC-%Pred-Post: 103 %
FEV6FVC-%Pred-Pre: 103 %
FVC-%Change-Post: 9 %
FVC-%Pred-Post: 95 %
FVC-%Pred-Pre: 87 %
FVC-Post: 4.75 L
FVC-Pre: 4.34 L
Post FEV1/FVC ratio: 70 %
Post FEV6/FVC ratio: 99 %
Pre FEV1/FVC ratio: 66 %
Pre FEV6/FVC Ratio: 100 %
RV % pred: 107 %
RV: 2.42 L
TLC % pred: 97 %
TLC: 7 L

## 2015-03-23 MED ORDER — PREDNISONE 10 MG PO TABS: ORAL_TABLET | ORAL | Status: DC

## 2015-03-23 MED ORDER — ALBUTEROL SULFATE HFA 108 (90 BASE) MCG/ACT IN AERS: 2.0000 | INHALATION_SPRAY | Freq: Four times a day (QID) | RESPIRATORY_TRACT | Status: DC | PRN

## 2015-03-23 NOTE — Telephone Encounter (Signed)
Spoke with pt's wife Cody Day (dpr on file) and relayed results and recs to her.  Verified pharmacy, pred taper sent in.  Advised pt's wife to have pt call us if he has further questions.  Nothing further needed at this time.

## 2015-03-23 NOTE — Progress Notes (Signed)
PFT PERFORMED TODAY. 

## 2015-03-23 NOTE — Telephone Encounter (Signed)
-----   Message from Juanito Doom, MD sent at 03/23/2015 11:01 AM EDT ----- A, Please let him know that his PFT showed that his asthma is a little worse than the last time we gave him the test.  I'd like for him to have a prednisone taper: Take 40mg  po daily for 3 days, then take 30mg  po daily for 3 days, then take 20mg  po daily for two days, then take 10mg  po daily for 2 days Dispense #27 I'd still like for him to have the CT scan of his chest  Thanks Ruby Cola

## 2015-03-24 ENCOUNTER — Other Ambulatory Visit: Payer: Self-pay

## 2015-03-24 ENCOUNTER — Encounter: Payer: Self-pay | Admitting: Pulmonary Disease

## 2015-03-24 DIAGNOSIS — R918 Other nonspecific abnormal finding of lung field: Secondary | ICD-10-CM

## 2015-03-28 ENCOUNTER — Other Ambulatory Visit: Payer: Self-pay

## 2015-03-28 DIAGNOSIS — R918 Other nonspecific abnormal finding of lung field: Secondary | ICD-10-CM

## 2015-04-03 ENCOUNTER — Ambulatory Visit
Admission: RE | Admit: 2015-04-03 | Discharge: 2015-04-03 | Disposition: A | Discharge status: Home / Self Care | Payer: PRIVATE HEALTH INSURANCE | Source: Ambulatory Visit | Attending: Pulmonary Disease | Admitting: Pulmonary Disease

## 2015-04-03 DIAGNOSIS — R918 Other nonspecific abnormal finding of lung field: Secondary | ICD-10-CM | POA: Diagnosis not present

## 2015-04-03 DIAGNOSIS — R0600 Dyspnea, unspecified: Secondary | ICD-10-CM

## 2015-04-03 HISTORY — DX: Pure hypercholesterolemia, unspecified: E78.00

## 2015-04-03 LAB — NM MYOCAR MULTI W/SPECT W/WALL MOTION / EF
LV dias vol: 204 mL
LV sys vol: 104 mL
SDS: 0
SRS: 12
SSS: 4
TID: 0.99

## 2015-04-03 MED ORDER — TECHNETIUM TC 99M SESTAMIBI - CARDIOLITE
30.0000 | Freq: Once | INTRAVENOUS | Status: AC | PRN
  Administered 2015-04-03: 09:00:00 29.671 via INTRAVENOUS

## 2015-04-03 MED ORDER — TECHNETIUM TC 99M SESTAMIBI - CARDIOLITE
13.2180 | Freq: Once | INTRAVENOUS | Status: AC | PRN
  Administered 2015-04-03: 13.218 via INTRAVENOUS

## 2015-04-03 MED ORDER — REGADENOSON 0.4 MG/5ML IV SOLN
0.4000 mg | Freq: Once | INTRAVENOUS | Status: AC
  Administered 2015-04-03: 0.4 mg via INTRAVENOUS

## 2015-04-04 ENCOUNTER — Telehealth: Payer: Self-pay | Admitting: Pulmonary Disease

## 2015-04-04 DIAGNOSIS — I429 Cardiomyopathy, unspecified: Secondary | ICD-10-CM

## 2015-04-04 NOTE — Telephone Encounter (Signed)
Cody Day had a stress test that showed no risk of ischemia but there was a decreased LVEF to 30-45%.  I called his home and spoke to his wife to discuss this and explained that he should see cardiology and have an echo, they are willing to proceed.   Will place cardiology consult and order echocardiogram, wife asks that we order the tests and visit in July.

## 2015-04-10 ENCOUNTER — Ambulatory Visit: Payer: Self-pay | Admitting: General Surgery

## 2015-04-20 ENCOUNTER — Other Ambulatory Visit: Payer: PRIVATE HEALTH INSURANCE

## 2015-04-25 ENCOUNTER — Ambulatory Visit (INDEPENDENT_AMBULATORY_CARE_PROVIDER_SITE_OTHER): Payer: PRIVATE HEALTH INSURANCE | Admitting: Pulmonary Disease

## 2015-04-25 ENCOUNTER — Encounter: Payer: Self-pay | Admitting: Pulmonary Disease

## 2015-04-25 VITALS — BP 136/84 | HR 103 | Ht 71.0 in | Wt 220.0 lb

## 2015-04-25 DIAGNOSIS — I420 Dilated cardiomyopathy: Secondary | ICD-10-CM | POA: Diagnosis not present

## 2015-04-25 DIAGNOSIS — R0600 Dyspnea, unspecified: Secondary | ICD-10-CM | POA: Diagnosis not present

## 2015-04-25 DIAGNOSIS — J4530 Mild persistent asthma, uncomplicated: Secondary | ICD-10-CM | POA: Diagnosis not present

## 2015-04-25 NOTE — Assessment & Plan Note (Signed)
This is a new problem discovered on the nuclear stress test. We plan for an echocardiogram and cardiology evaluation as detailed above.

## 2015-04-25 NOTE — Progress Notes (Signed)
Subjective:    Patient ID: Cody Day, male    DOB: [birth year], 59 y.o.   MRN: [id number]  Synopsis: Cody Day first saw the [facility] pulmonary clinic in the spring of 2015 for shortness of breath, cough, mucus production. He had lung function testing in 2014 which was completely normal. He had a past history significant for long-term inhaled illicit drug use with multiple substances including methamphetamine.  HPI Chief Complaint  Patient presents with  . Follow-up    Pt c/o prod cough with gray mucus qam.  Also notes increased chest tightness and sob when outside and with exertion.     Cody Day went to [place] state and visited family and went fishing.  He had no problems breathing out there. He says that as soon as he got of the plane in  he started having more shortness of breath, cough, tightness in his chest.  Albuterol helps when he takes it. He had a headache after his stress test.    Past Medical History  Diagnosis Date  . Hypertension   . Asthma   . Gout   . Syncope 10/11    in settin gof asthma exacerbation w coughing. Ech (10/11): EF > 55%, mild LVH, grade I diastolic dysfunction, nomral RV size and systolic function,. normal valves. Carotid US (10/11): minimal disease  . Achilles tendon rupture 10/11    and repair  . Sleep apnea   . Hemorrhoids   . Hypercholesteremia        Review of Systems  Constitutional: Negative for fever, chills and fatigue.  HENT: Negative for postnasal drip, rhinorrhea and sinus pressure.   Respiratory: Positive for shortness of breath. Negative for cough and wheezing.   Cardiovascular: Negative for chest pain and palpitations.       Objective:   Physical Exam Filed Vitals:   04/25/15 1546 04/25/15 1548  BP:  136/84  Pulse:  103  Height:  5\' 11"  (1.803 m)  Weight: 220 lb (99.791 kg) 220 lb (99.791 kg)  SpO2:  97%   RA  Gen: well appearing, no acute distress HEENT: NCAT, EOMi, OP clear, PULM: CTA B CV: RRR,  no mgr, no JVD AB: BS+, soft, nontender, no hsm Ext: warm, no edema, no clubbing, no cyanosis  2011 echocardiogram results showed mild LVH and an EF of 55%, normal RV size and function High-resolution CT chest from 2016 showed normal pulmonary parenchyma but three-vessel coronary disease Pulmonary function testing from 2016 showed mild airflow obstruction with a 16% improvement with broncho-dilator, increased DLCO and total lung capacity Nuclear stress test from 2016 showed no evidence of ischemia but an LVEF of 35-40% which was new    Assessment & Plan:   Dyspnea Certainly his asthma contributes to his dyspnea, but I believe that the cardiomyopathy may also be related. Further, I believe that anxiety complicates the picture by causing vocal cord dysfunction. It should be noted that while he has asthma his pulmonary function test showed only mild airflow obstruction.  Plan: Echocardiogram Follow-up with cardiology Avoid hot and humid air is much as possible He was given advice today on voice exercises and relaxation techniques to improve symptoms from vocal cord dysfunction and anxiety  Asthma, chronic His lung function testing and his high-resolution CT scan of the chest did not show significant lung disease. He does have mild asthma but I don't believe that this is the only explanation for his symptoms. See discussion above.  Plan: Continue Symbicort twice a day  and as needed albuterol    Updated Medication List Outpatient Encounter Prescriptions as of 04/25/2015  Medication Sig  . albuterol (PROVENTIL HFA;VENTOLIN HFA) 108 (90 BASE) MCG/ACT inhaler Inhale 2 puffs into the lungs every 6 (six) hours as needed for wheezing or shortness of breath.  . allopurinol (ZYLOPRIM) 100 MG tablet TAKE ONE (1) TABLET EACH DAY  . B Complex-C (SUPER B COMPLEX PO) Take 1 tablet by mouth daily.  . Cinnamon 500 MG capsule Take 500 mg by mouth 2 (two) times daily.  . Garlic 757 MG TABS Take 1 tablet  by mouth daily.  Marland Kitchen lisinopril (PRINIVIL,ZESTRIL) 20 MG tablet TAKE ONE (1) TABLET EACH DAY  . Multiple Vitamins-Minerals (MULTIVITAL) tablet Take 1 tablet by mouth daily.    . Omega-3 Fatty Acids (FISH OIL) 1000 MG CAPS Take 1,000 mg by mouth daily.  . simvastatin (ZOCOR) 20 MG tablet TAKE ONE TABLET AT BEDTIME  . Spacer/Aero-Holding Chambers (AEROCHAMBER MV) inhaler Use as instructed  . SYMBICORT 160-4.5 MCG/ACT inhaler USE 2 PUFFS TWICE DAILY  . testosterone cypionate (DEPOTESTOTERONE CYPIONATE) 100 MG/ML injection Inject 100 mg into the muscle every 7 (seven) days. On Fridays only uses 1/2 ml  For IM use only  . tiZANidine (ZANAFLEX) 4 MG capsule Take 2 mg by mouth as needed for muscle spasms.  . [DISCONTINUED] predniSONE (DELTASONE) 10 MG tablet Take 1 tablet (10 mg total) by mouth daily with breakfast. (Patient not taking: Reported on 04/25/2015)  . [DISCONTINUED] predniSONE (DELTASONE) 10 MG tablet 40mg  X3 days, 30mg  X3 days, 20mg  X2 days, 10mg  X2 days. (Patient not taking: Reported on 04/25/2015)   No facility-administered encounter medications on file as of 04/25/2015.

## 2015-04-25 NOTE — Patient Instructions (Signed)
Keep taking your inhalers as you're doing Use albuterol as needed for shortness of breath I recommend that you stay out of the heat and humidity as much as possible Follow-up with the echocardiogram as ordered and the cardiology visit We will see you back in 2 months or sooner if needed

## 2015-04-25 NOTE — Assessment & Plan Note (Signed)
Certainly his asthma contributes to his dyspnea, but I believe that the cardiomyopathy may also be related. Further, I believe that anxiety complicates the picture by causing vocal cord dysfunction. It should be noted that while he has asthma his pulmonary function test showed only mild airflow obstruction.  Plan: Echocardiogram Follow-up with cardiology Avoid hot and humid air is much as possible He was given advice today on voice exercises and relaxation techniques to improve symptoms from vocal cord dysfunction and anxiety

## 2015-04-25 NOTE — Assessment & Plan Note (Signed)
His lung function testing and his high-resolution CT scan of the chest did not show significant lung disease. He does have mild asthma but I don't believe that this is the only explanation for his symptoms. See discussion above.  Plan: Continue Symbicort twice a day and as needed albuterol

## 2015-05-04 ENCOUNTER — Other Ambulatory Visit: Payer: PRIVATE HEALTH INSURANCE

## 2015-05-04 ENCOUNTER — Ambulatory Visit (INDEPENDENT_AMBULATORY_CARE_PROVIDER_SITE_OTHER): Payer: PRIVATE HEALTH INSURANCE

## 2015-05-04 ENCOUNTER — Other Ambulatory Visit: Payer: Self-pay

## 2015-05-04 DIAGNOSIS — I429 Cardiomyopathy, unspecified: Secondary | ICD-10-CM | POA: Diagnosis not present

## 2015-05-11 NOTE — Progress Notes (Signed)
Quick Note:  Called and spoke to pt's wife Levada Dy. Reviewed results and recs. She states he has an appointment with his cardiologist on 05/15/15. Pt's wife voiced understanding and had no further questions. ______

## 2015-05-15 ENCOUNTER — Encounter: Payer: Self-pay | Admitting: Cardiovascular Disease

## 2015-05-15 ENCOUNTER — Ambulatory Visit (INDEPENDENT_AMBULATORY_CARE_PROVIDER_SITE_OTHER): Payer: PRIVATE HEALTH INSURANCE | Admitting: Cardiovascular Disease

## 2015-05-15 VITALS — BP 144/82 | HR 96 | Ht 71.0 in | Wt 219.5 lb

## 2015-05-15 DIAGNOSIS — I2510 Atherosclerotic heart disease of native coronary artery without angina pectoris: Secondary | ICD-10-CM

## 2015-05-15 DIAGNOSIS — R0600 Dyspnea, unspecified: Secondary | ICD-10-CM

## 2015-05-15 DIAGNOSIS — I10 Essential (primary) hypertension: Secondary | ICD-10-CM | POA: Diagnosis not present

## 2015-05-15 DIAGNOSIS — R079 Chest pain, unspecified: Secondary | ICD-10-CM

## 2015-05-15 NOTE — Progress Notes (Signed)
Primary care physician: Dr. Diona Browner  HPI  This is a pleasant 59 year old male who was referred by Dr. Lake Bells for evaluation of possible cardiomyopathy. The patient has known history of asthma, anxiety, sleep apnea not on CPAP, obesity with 45 pounds intentional weight loss, essential hypertension, hyperlipidemia and previous tobacco and drug use. He underwent cardiac catheterization in 2008 which showed minor luminal irregularities with ejection fraction of 55%.  He was seen by Dr. Aundra Dubin in 2011 for syncope which was felt to be cough induced. He suffers from asthma with episodes of anxiety. He describes substernal tightness when he is having hard time breathing. He denies exertional chest discomfort. He does complain of occasional palpitations. He underwent a nuclear stress test in June which showed no significant EKG changes with exercise. There was no significant perfusion defects but ejection fraction was calculated to be moderately reduced between 30-44%. He subsequently underwent an echocardiogram in our office which was reviewed by me today. The left ventricle was mildly dilated. Ejection fraction was around 55%.     Allergies  Allergen Reactions  . Penicillins     REACTION: (Childhood - bumps, rash)     Current Outpatient Prescriptions on File Prior to Visit  Medication Sig Dispense Refill  . albuterol (PROVENTIL HFA;VENTOLIN HFA) 108 (90 BASE) MCG/ACT inhaler Inhale 2 puffs into the lungs every 6 (six) hours as needed for wheezing or shortness of breath. 1 Inhaler 3  . allopurinol (ZYLOPRIM) 100 MG tablet TAKE ONE (1) TABLET EACH DAY 30 tablet 11  . B Complex-C (SUPER B COMPLEX PO) Take 1 tablet by mouth daily.    . Cinnamon 500 MG capsule Take 500 mg by mouth 2 (two) times daily.    . Garlic 570 MG TABS Take 1 tablet by mouth daily.    Marland Kitchen lisinopril (PRINIVIL,ZESTRIL) 20 MG tablet TAKE ONE (1) TABLET EACH DAY 30 tablet 11  . Multiple Vitamins-Minerals (MULTIVITAL) tablet Take  1 tablet by mouth daily.      . simvastatin (ZOCOR) 20 MG tablet TAKE ONE TABLET AT BEDTIME 30 tablet 11  . Spacer/Aero-Holding Chambers (AEROCHAMBER MV) inhaler Use as instructed 1 each 0  . SYMBICORT 160-4.5 MCG/ACT inhaler USE 2 PUFFS TWICE DAILY 10.2 g 11  . testosterone cypionate (DEPOTESTOTERONE CYPIONATE) 100 MG/ML injection Inject 100 mg into the muscle every 7 (seven) days. On Fridays only uses 1/2 ml  For IM use only    . tiZANidine (ZANAFLEX) 4 MG capsule Take 2 mg by mouth as needed for muscle spasms.     No current facility-administered medications on file prior to visit.     Past Medical History  Diagnosis Date  . Hypertension   . Asthma   . Gout   . Syncope 10/11    in settin gof asthma exacerbation w coughing. Ech (10/11): EF > 55%, mild LVH, grade I diastolic dysfunction, nomral RV size and systolic function,. normal valves. Carotid US (10/11): minimal disease  . Achilles tendon rupture 10/11    and repair  . Sleep apnea   . Hemorrhoids   . Hypercholesteremia      Past Surgical History  Procedure Laterality Date  . Treadmill stress test  2003  . Intestinal blockage      as a child  . Abdominal hernia repair  [id number]  . Femoral hernia repair  2007  . Left heart cath  2008    minimal luminal irregularities EF 55%  . Kidney stone extraction      unic acid  stones  . Calcium kidney stones    . Colonoscopy  2011    [facility], [place], [place]; Dr. [name]. Multiple adenomatous polyps (total 4).   . Hemorrhoid surgery    . Hernia repair  09/10/2010    Repair of recurrent ventral hernia, resection of previously placed mesh x2, implantation 7.5 x 10 cm Gore-Tex dual mesh in an underlay position, repair of epigastric hernia with a 4.2 cm Proceed ventral patch.  . Hernia repair   02//28/2007    Laparoscopic right inguinal hernia repair with Surgipro mesh, Ventrilex patch at umbilicus  . Hernia repair  11/01/1990  .    Small oval Kugel patch placed  in preperitoneal space, [name], MD  . Hernia repair  01/17/1997    Recurrent ventral hernia 15 x 19 Gore-Tex dual mesh placed laparoscopically with multiple lefft--sided ports, [name], MD  . Hernia repair  01/07/1995     Primary repair of ventral hernia. [name], MD  . Cardiac catheterization      [facility]      Family History  Problem Relation Age of Onset  . Heart attack Mother 59  . Coronary artery disease Mother   . Hypertension Father   . Cataracts Father   . Diabetes Father   . Hypertension Sister   . Depression Sister   . Hypertension Brother   . Colon cancer Brother   . Diabetes Other     1st degree relative  . Colon cancer Maternal Aunt      History   Social History  . Marital Status: Married    Spouse Name: N/A  . Number of Children: Y  . Years of Education: N/A   Occupational History  . woodmilling specialist    Social History Main Topics  . Smoking status: Former Smoker -- 0.25 packs/day for 35 years    Types: Cigarettes    Quit date: 10/22/1995  . Smokeless tobacco: Current User    Types: Chew     Comment: rarely smoked cigarettes and cigars.  states he smoked drugs mostly  . Alcohol Use: Yes     Comment: 6 beers daily; used to use cocaine and methamphetamine  . Drug Use: No     Comment: former smokerx 27 years  . Sexual Activity: Not on file   Other Topics Concern  . Not on file   Social History Narrative   Poor diet, lots of fats. Does not regularly exercise. Married. Originally from [place].      ROS A 10 point review of system was performed. It is negative other than that mentioned in the history of present illness.   PHYSICAL EXAM   BP 144/82 mmHg  Pulse 96  Ht 5\' 11"  (1.803 m)  Wt 219 lb 8 oz (99.565 kg)  BMI 30.63 kg/m2 Constitutional: He is oriented to person, place, and time. He appears well-developed and well-nourished. No distress.  HENT: No nasal discharge.  Head: Normocephalic and atraumatic.  Eyes:  Pupils are equal and round.  No discharge. Neck: Normal range of motion. Neck supple. No JVD present. No thyromegaly present.  Cardiovascular: Normal rate, regular rhythm, normal heart sounds. Exam reveals no gallop and no friction rub. No murmur heard.  Pulmonary/Chest: Effort normal and breath sounds normal. No stridor. No respiratory distress. He has no wheezes. He has no rales. He exhibits no tenderness.  Abdominal: Soft. Bowel sounds are normal. He exhibits no distension. There is no tenderness. There is no rebound and no  guarding.  Musculoskeletal: Normal range of motion. He exhibits no edema and no tenderness.  Neurological: He is alert and oriented to person, place, and time. Coordination normal.  Skin: Skin is warm and dry. No rash noted. He is not diaphoretic. No erythema. No pallor.  Psychiatric: He has a normal mood and affect. His behavior is normal. Judgment and thought content normal.       AJO:INOMV  Rhythm  WITHIN NORMAL LIMITS   ASSESSMENT AND PLAN

## 2015-05-15 NOTE — Patient Instructions (Signed)
Medication Instructions: Continue same medications.   Labwork: None.   Procedures/Testing: None.   Follow-Up: As needed.   Any Additional Special Instructions Will Be Listed Below (If Applicable).

## 2015-05-15 NOTE — Assessment & Plan Note (Signed)
There was minor atherosclerosis on cardiac catheterization in 2008. Thus, I recommend continuing low-dose aspirin and treatment of risk factors. I discussed with him the importance of healthy lifestyle and exercises.

## 2015-05-15 NOTE — Assessment & Plan Note (Signed)
Blood pressure is controlled on current medications. 

## 2015-05-15 NOTE — Assessment & Plan Note (Signed)
It seems like his dyspnea is likely due to his known lung disease and asthma. The chest tightness only happens with he is having hard time breathing. He has no significant exertional symptoms. I reviewed his recent cardiac studies with the discrepancy in ejection fraction between nuclear stress test and echocardiogram. We have been having some difficulty with ejection fraction calculation on nuclear stress test due to poor tracking of the left ventricular borders. The echocardiogram is more accurate and this was reviewed by me today. His ejection fraction is 55% although the left ventricle seems to be mildly dilated likely related to obesity and hypertension. I discussed with him that stress testing is not 100% sensitive. The possibility of obstructive coronary artery disease has not been completely excluded. However, he had cardiac catheterization in 2008 which showed no significant disease. Also there was no significant perfusion defects on recent nuclear stress test. He also reports that there has been no worsening of his symptoms. Due to all of the above, I think the patient can continue medical therapy and should report to [place] if he has worsening symptoms.

## 2015-05-23 ENCOUNTER — Encounter: Payer: Self-pay | Admitting: *Deleted

## 2015-06-27 ENCOUNTER — Ambulatory Visit (INDEPENDENT_AMBULATORY_CARE_PROVIDER_SITE_OTHER): Payer: PRIVATE HEALTH INSURANCE | Admitting: Pulmonary Disease

## 2015-06-27 ENCOUNTER — Encounter: Payer: Self-pay | Admitting: Pulmonary Disease

## 2015-06-27 VITALS — BP 136/88 | HR 93 | Ht 71.0 in

## 2015-06-27 DIAGNOSIS — J4530 Mild persistent asthma, uncomplicated: Secondary | ICD-10-CM | POA: Diagnosis not present

## 2015-06-27 MED ORDER — BECLOMETHASONE DIPROPIONATE 80 MCG/ACT IN AERS: 2.0000 | INHALATION_SPRAY | Freq: Two times a day (BID) | RESPIRATORY_TRACT | Status: DC

## 2015-06-27 MED ORDER — MONTELUKAST SODIUM 10 MG PO TABS: 10.0000 mg | ORAL_TABLET | Freq: Every day | ORAL | Status: DC

## 2015-06-27 NOTE — Assessment & Plan Note (Signed)
We put Cody Day through an extensive battery of tests this summer for worsening asthma symptoms. Fortunately, it seems that his heart is strong and there is no evidence of a cardiomyopathy as originally suspected. His lung function testing showed airflow obstruction with significant broncho-dilator response. Considering his sensation of shortness of breath and chest congestion I feel that the majority of his symptoms are due to asthma.  It may be that there is a new allergen his environment or a new fungal type infection driving his symptoms. When he blood work to try to support this. Fortunately, his CT chest did not show other lung pathology.  Plan: We will check for allergic bronchopulmonary aspergillosis Check serum IgE Check serum eosinophils If any of these are positive then that will help guide [place] in terms of biologic and/or other modalities of treatment Add Qvar 80 g 2 puffs twice a day Add Singulair Follow-up one month to review blood work results

## 2015-06-27 NOTE — Addendum Note (Signed)
Addended by: Len Blalock on: 06/27/2015 03:04 PM   Modules accepted: Orders

## 2015-06-27 NOTE — Patient Instructions (Signed)
Take Qvar 2 puffs twice a day no matter how you feel,  take this in addition to the Symbicort Use Singulair daily (montelukast) the matter how you feel Use Mucinex as needed for thick mucus We will arrange blood work for you when you go to your primary care doctor's office Follow-up with me in one month or sooner if needed

## 2015-06-27 NOTE — Progress Notes (Signed)
Subjective:    Patient ID: Cody Day, male    DOB: 27-Feb-1956, 59 y.o.   MRN: [id number]  Synopsis: [name] No first saw the [facility] pulmonary clinic in the spring of 2015 for shortness of breath, cough, mucus production. He had lung function testing in 2014 which was completely normal. He had a past history significant for long-term inhaled illicit drug use with multiple substances including methamphetamine.  HPI Chief Complaint  Patient presents with  . Follow-up    pt c/o prod cough with gray/clear/yellow mucus qam, improves with inhaled meds.  Chest tightness with DOE.     Arther is still having a lot of mucus production and cough with dyspnea.  He really can't pinpoint where the problem is.  The summer has been hard with lots of humidity and heat.  When the dew point is low he feels better.  But in the mornings it is worse. He has sinus congestion in the mornings.  He has to stop and take a deep breath a lot.  He is coughing up mucus which is grey in the mornings.  Needed prednisone in July which helped.    Past Medical History  Diagnosis Date  . Hypertension   . Asthma   . Gout   . Syncope 10/11    in settin gof asthma exacerbation w coughing. Ech (10/11): EF > 55%, mild LVH, grade I diastolic dysfunction, nomral RV size and systolic function,. normal valves. Carotid US (10/11): minimal disease  . Achilles tendon rupture 10/11    and repair  . Sleep apnea   . Hemorrhoids   . Hypercholesteremia        Review of Systems  Constitutional: Negative for fever, chills and fatigue.  HENT: Negative for postnasal drip, rhinorrhea and sinus pressure.   Respiratory: Positive for shortness of breath. Negative for cough and wheezing.   Cardiovascular: Negative for chest pain and palpitations.       Objective:   Physical Exam Filed Vitals:   06/27/15 1436  BP: 136/88  Pulse: 93  Height: 5\' 11"  (1.803 m)  SpO2: 95%   RA  Gen: well appearing, no acute  distress HEENT: NCAT, EOMi, OP clear, PULM: CTA B CV: RRR, no mgr, no JVD AB: BS+, soft, nontender, no hsm Ext: warm, no edema, no clubbing, no cyanosis  2011 echocardiogram results showed mild LVH and an EF of 55%, normal RV size and function High-resolution CT chest from 2016 showed normal pulmonary parenchyma but three-vessel coronary disease Pulmonary function testing from 2016 showed mild airflow obstruction with a 16% improvement with broncho-dilator, increased DLCO and total lung capacity Nuclear stress test from 2016 showed no evidence of ischemia but an LVEF of 35-40% which was new Cardiology records reviewed where he was initially evaluated for presumed cardiomyopathy but a follow-up echocardiogram showed his LVEF was actually normal.    Assessment & Plan:   Asthma, chronic We put Geran through an extensive battery of tests this summer for worsening asthma symptoms. Fortunately, it seems that his heart is strong and there is no evidence of a cardiomyopathy as originally suspected. His lung function testing showed airflow obstruction with significant broncho-dilator response. Considering his sensation of shortness of breath and chest congestion I feel that the majority of his symptoms are due to asthma.  It may be that there is a new allergen his environment or a new fungal type infection driving his symptoms. When he blood work to try to support this. Fortunately, his  CT chest did not show other lung pathology.  Plan: We will check for allergic bronchopulmonary aspergillosis Check serum IgE Check serum eosinophils If any of these are positive then that will help guide [place] in terms of biologic and/or other modalities of treatment Add Qvar 80 g 2 puffs twice a day Add Singulair Follow-up one month to review blood work results    Updated Medication List Outpatient Encounter Prescriptions as of 06/27/2015  Medication Sig  . albuterol (PROVENTIL HFA;VENTOLIN HFA) 108 (90 BASE)  MCG/ACT inhaler Inhale 2 puffs into the lungs every 6 (six) hours as needed for wheezing or shortness of breath.  . allopurinol (ZYLOPRIM) 100 MG tablet TAKE ONE (1) TABLET EACH DAY  . B Complex-C (SUPER B COMPLEX PO) Take 1 tablet by mouth daily.  . Cinnamon 500 MG capsule Take 1,000 mg by mouth daily.   . Garlic 947 MG TABS Take 1 tablet by mouth daily.  Marland Kitchen lisinopril (PRINIVIL,ZESTRIL) 20 MG tablet TAKE ONE (1) TABLET EACH DAY  . Multiple Vitamins-Minerals (MULTIVITAL) tablet Take 1 tablet by mouth daily.    . simvastatin (ZOCOR) 20 MG tablet TAKE ONE TABLET AT BEDTIME  . Spacer/Aero-Holding Chambers (AEROCHAMBER MV) inhaler Use as instructed  . SYMBICORT 160-4.5 MCG/ACT inhaler USE 2 PUFFS TWICE DAILY  . testosterone cypionate (DEPOTESTOTERONE CYPIONATE) 100 MG/ML injection Inject 100 mg into the muscle every 7 (seven) days. On Fridays only uses 1/2 ml  For IM use only  . tiZANidine (ZANAFLEX) 4 MG capsule Take 2 mg by mouth as needed for muscle spasms.  . beclomethasone (QVAR) 80 MCG/ACT inhaler Inhale 2 puffs into the lungs 2 (two) times daily.  . montelukast (SINGULAIR) 10 MG tablet Take 1 tablet (10 mg total) by mouth daily.   No facility-administered encounter medications on file as of 06/27/2015.

## 2015-06-28 ENCOUNTER — Encounter: Payer: Self-pay | Admitting: General Surgery

## 2015-06-29 ENCOUNTER — Ambulatory Visit (INDEPENDENT_AMBULATORY_CARE_PROVIDER_SITE_OTHER): Payer: PRIVATE HEALTH INSURANCE | Admitting: General Surgery

## 2015-06-29 ENCOUNTER — Encounter: Payer: Self-pay | Admitting: General Surgery

## 2015-06-29 VITALS — BP 132/74 | HR 76 | Resp 16 | Ht 71.0 in | Wt 219.0 lb

## 2015-06-29 DIAGNOSIS — Z86010 Personal history of colonic polyps: Secondary | ICD-10-CM

## 2015-06-29 MED ORDER — POLYETHYLENE GLYCOL 3350 17 GM/SCOOP PO POWD: ORAL | Status: DC

## 2015-06-29 NOTE — Progress Notes (Signed)
Patient ID: Cody Day, male   DOB: Nov 08, 1955, 59 y.o.   MRN: [id number]  Chief Complaint  Patient presents with  . Colonoscopy    HPI Cody Day is a 59 y.o. male.  Here today to discuss having a colonoscopy. His last colonoscopy was 2011 in [place]. Patient was recently diagnosed with asthma, was prescribed new medications. Patient denies GI issues. Patient does report tenderness in is left inguinal area.   HPI  Past Medical History  Diagnosis Date  . Hypertension   . Asthma   . Gout   . Syncope 10/11    in settin gof asthma exacerbation w coughing. Ech (10/11): EF > 55%, mild LVH, grade I diastolic dysfunction, nomral RV size and systolic function,. normal valves. Carotid US (10/11): minimal disease  . Achilles tendon rupture 10/11    and repair  . Sleep apnea   . Hemorrhoids   . Hypercholesteremia   . Adenomatous polyp of colon 04/06/2010    Adenomatous polyps 3 in the descending colon, times one in the ascending colon [facility])    Past Surgical History  Procedure Laterality Date  . Treadmill stress test  2003  . Intestinal blockage      as a child  . Abdominal hernia repair  [id number]  . Femoral hernia repair  2007  . Left heart cath  2008    minimal luminal irregularities EF 55%  . Kidney stone extraction      unic acid stones  . Calcium kidney stones    . Colonoscopy  2011    [facility], [place], [place]; Dr. [name]. Multiple adenomatous polyps (total 4).   . Hemorrhoid surgery    . Hernia repair  09/10/2010    Repair of recurrent ventral hernia, resection of previously placed mesh x2, implantation 7.5 x 10 cm Gore-Tex dual mesh in an underlay position, repair of epigastric hernia with a 4.2 cm Proceed ventral patch.  . Hernia repair   02//28/2007    Laparoscopic right inguinal hernia repair with Surgipro mesh, Ventrilex patch at umbilicus  . Hernia repair  11/01/1990  .    Small oval Kugel patch placed in preperitoneal space,  [name], MD  . Hernia repair  01/17/1997    Recurrent ventral hernia 15 x 19 Gore-Tex dual mesh placed laparoscopically with multiple lefft--sided ports, [name], MD  . Hernia repair  01/07/1995     Primary repair of ventral hernia. [name], MD  . Cardiac catheterization      [facility]     Family History  Problem Relation Age of Onset  . Heart attack Mother 34  . Coronary artery disease Mother   . Hypertension Father   . Cataracts Father   . Diabetes Father   . Hypertension Sister   . Depression Sister   . Hypertension Brother   . Colon cancer Brother   . Diabetes Other     1st degree relative  . Colon cancer Maternal Aunt     Social History Social History  Substance Use Topics  . Smoking status: Former Smoker -- 0.25 packs/day for 35 years    Types: Cigarettes    Quit date: 10/22/1995  . Smokeless tobacco: Current User    Types: Chew     Comment: rarely smoked cigarettes and cigars.  states he smoked drugs mostly  . Alcohol Use: 0.0 oz/week    0 Standard drinks or equivalent per week     Comment: 6 beers daily; used  to use cocaine and methamphetamine    Allergies  Allergen Reactions  . Penicillins     REACTION: (Childhood - bumps, rash)    Current Outpatient Prescriptions  Medication Sig Dispense Refill  . albuterol (PROVENTIL HFA;VENTOLIN HFA) 108 (90 BASE) MCG/ACT inhaler Inhale 2 puffs into the lungs every 6 (six) hours as needed for wheezing or shortness of breath. 1 Inhaler 3  . allopurinol (ZYLOPRIM) 100 MG tablet TAKE ONE (1) TABLET EACH DAY 30 tablet 11  . B Complex-C (SUPER B COMPLEX PO) Take 1 tablet by mouth daily.    . beclomethasone (QVAR) 80 MCG/ACT inhaler Inhale 2 puffs into the lungs 2 (two) times daily. 1 Inhaler 6  . Cinnamon 500 MG capsule Take 1,000 mg by mouth daily.     . Garlic 662 MG TABS Take 1 tablet by mouth daily.    Marland Kitchen lisinopril (PRINIVIL,ZESTRIL) 20 MG tablet TAKE ONE (1) TABLET EACH DAY 30 tablet 11  . montelukast  (SINGULAIR) 10 MG tablet Take 1 tablet (10 mg total) by mouth daily. 30 tablet 2  . Multiple Vitamins-Minerals (MULTIVITAL) tablet Take 1 tablet by mouth daily.      . simvastatin (ZOCOR) 20 MG tablet TAKE ONE TABLET AT BEDTIME 30 tablet 11  . Spacer/Aero-Holding Chambers (AEROCHAMBER MV) inhaler Use as instructed 1 each 0  . SYMBICORT 160-4.5 MCG/ACT inhaler USE 2 PUFFS TWICE DAILY 10.2 g 11  . testosterone cypionate (DEPOTESTOTERONE CYPIONATE) 100 MG/ML injection Inject 100 mg into the muscle every 7 (seven) days. On Fridays only uses 1/2 ml  For IM use only    . tiZANidine (ZANAFLEX) 4 MG capsule Take 2 mg by mouth as needed for muscle spasms.    . polyethylene glycol powder (GLYCOLAX/MIRALAX) powder 255 grams one bottle for colonoscopy prep 255 g 0   No current facility-administered medications for this visit.    Review of Systems Review of Systems  Constitutional: Negative.   Respiratory: Negative.   Cardiovascular: Negative.   Gastrointestinal: Negative for diarrhea, constipation and blood in stool.    Blood pressure 132/74, pulse 76, resp. rate 16, height 5\' 11"  (1.803 m), weight 219 lb (99.338 kg).  Physical Exam Physical Exam  Constitutional: He is oriented to person, place, and time. He appears well-developed and well-nourished.  HENT:  Mouth/Throat: Oropharynx is clear and moist.  Eyes: Conjunctivae are normal. No scleral icterus.  Neck: Neck supple.  Cardiovascular: Normal rate, regular rhythm and normal heart sounds.   Pulmonary/Chest: Effort normal and breath sounds normal.  Lymphadenopathy:    He has no cervical adenopathy.  Neurological: He is alert and oriented to person, place, and time.  Skin: Skin is warm and dry.  Psychiatric: His behavior is normal.    Data Reviewed 2011 pathology report reviewed.  Assessment    Past history adenomatous polyps of the colon.    Plan    Indication for repeat colonoscopy reviewed.  The patient will be contacted  early November to confirm no change in medications or pulmonary symptoms. History and physical will be updated/repeated the morning of the procedure.    Colonoscopy with possible biopsy/polypectomy prn: Information regarding the procedure, including its potential risks and complications (including but not limited to perforation of the bowel, which may require emergency surgery to repair, and bleeding) was verbally given to the patient. Educational information regarding lower intestinal endoscopy was given to the patient. Written instructions for how to complete the bowel prep using Miralax were provided. The importance of drinking ample fluids  to avoid dehydration as a result of the prep emphasized.  Patient has been scheduled for a colonoscopy on 08-30-15 at [facility].   PCP:  Fae Pippin 06/30/2015, 9:17 AM

## 2015-06-29 NOTE — Patient Instructions (Addendum)
Colonoscopy A colonoscopy is an exam to look at the entire large intestine (colon). This exam can help find problems such as tumors, polyps, inflammation, and areas of bleeding. The exam takes about 1 hour.  LET [facility] CARE PROVIDER KNOW ABOUT:   Any allergies you have.  All medicines you are taking, including vitamins, herbs, eye drops, creams, and over-the-counter medicines.  Previous problems you or members of your family have had with the use of anesthetics.  Any blood disorders you have.  Previous surgeries you have had.  Medical conditions you have. RISKS AND COMPLICATIONS  Generally, this is a safe procedure. However, as with any procedure, complications can occur. Possible complications include:  Bleeding.  Tearing or rupture of the colon wall.  Reaction to medicines given during the exam.  Infection (rare). BEFORE THE PROCEDURE   Ask your health care provider about changing or stopping your regular medicines.  You may be prescribed an oral bowel prep. This involves drinking a large amount of medicated liquid, starting the day before your procedure. The liquid will cause you to have multiple loose stools until your stool is almost clear or light green. This cleans out your colon in preparation for the procedure.  Do not eat or drink anything else once you have started the bowel prep, unless your health care provider tells you it is safe to do so.  Arrange for someone to drive you home after the procedure. PROCEDURE   You will be given medicine to help you relax (sedative).  You will lie on your side with your knees bent.  A long, flexible tube with a light and camera on the end (colonoscope) will be inserted through the rectum and into the colon. The camera sends video back to a computer screen as it moves through the colon. The colonoscope also releases carbon dioxide gas to inflate the colon. This helps your health care provider see the area better.  During  the exam, your health care provider may take a small tissue sample (biopsy) to be examined under a microscope if any abnormalities are found.  The exam is finished when the entire colon has been viewed. AFTER THE PROCEDURE   Do not drive for 24 hours after the exam.  You may have a small amount of blood in your stool.  You may pass moderate amounts of gas and have mild abdominal cramping or bloating. This is caused by the gas used to inflate your colon during the exam.  Ask when your test results will be ready and how you will get your results. Make sure you get your test results. Document Released: 10/04/2000 Document Revised: 07/28/2013 Document Reviewed: 06/14/2013 [facility] Patient Information 2015 [place], [place]. This information is not intended to replace advice given to you by your health care provider. Make sure you discuss any questions you have with your health care provider.  Patient has been scheduled for a colonoscopy on 08-30-15 at [facility].

## 2015-06-30 ENCOUNTER — Encounter: Payer: Self-pay | Admitting: General Surgery

## 2015-06-30 DIAGNOSIS — Z86010 Personal history of colonic polyps: Secondary | ICD-10-CM | POA: Insufficient documentation

## 2015-07-13 ENCOUNTER — Telehealth: Payer: Self-pay | Admitting: Family Medicine

## 2015-07-13 DIAGNOSIS — E785 Hyperlipidemia, unspecified: Secondary | ICD-10-CM

## 2015-07-13 DIAGNOSIS — M1A9XX0 Chronic gout, unspecified, without tophus (tophi): Secondary | ICD-10-CM

## 2015-07-13 DIAGNOSIS — R7303 Prediabetes: Secondary | ICD-10-CM

## 2015-07-13 DIAGNOSIS — Z125 Encounter for screening for malignant neoplasm of prostate: Secondary | ICD-10-CM

## 2015-07-13 NOTE — Telephone Encounter (Signed)
-----   Message from Ellamae Sia sent at 07/06/2015  4:15 PM EDT ----- Regarding: Lab orders for Friday, 9.23.16 Patient is scheduled for CPX labs, please order future labs, Thanks , Karna Christmas

## 2015-07-14 ENCOUNTER — Other Ambulatory Visit (INDEPENDENT_AMBULATORY_CARE_PROVIDER_SITE_OTHER): Payer: PRIVATE HEALTH INSURANCE

## 2015-07-14 DIAGNOSIS — E785 Hyperlipidemia, unspecified: Secondary | ICD-10-CM | POA: Diagnosis not present

## 2015-07-14 DIAGNOSIS — M1A9XX0 Chronic gout, unspecified, without tophus (tophi): Secondary | ICD-10-CM

## 2015-07-14 DIAGNOSIS — R7303 Prediabetes: Secondary | ICD-10-CM

## 2015-07-14 DIAGNOSIS — Z125 Encounter for screening for malignant neoplasm of prostate: Secondary | ICD-10-CM | POA: Diagnosis not present

## 2015-07-14 DIAGNOSIS — R7309 Other abnormal glucose: Secondary | ICD-10-CM | POA: Diagnosis not present

## 2015-07-14 DIAGNOSIS — J4530 Mild persistent asthma, uncomplicated: Secondary | ICD-10-CM | POA: Diagnosis not present

## 2015-07-14 LAB — COMPREHENSIVE METABOLIC PANEL
ALT: 32 U/L (ref 0–53)
AST: 33 U/L (ref 0–37)
Albumin: 4.3 g/dL (ref 3.5–5.2)
Alkaline Phosphatase: 50 U/L (ref 39–117)
BUN: 14 mg/dL (ref 6–23)
CO2: 27 mEq/L (ref 19–32)
Calcium: 9.4 mg/dL (ref 8.4–10.5)
Chloride: 100 mEq/L (ref 96–112)
Creatinine, Ser: 0.85 mg/dL (ref 0.40–1.50)
GFR: 98.17 mL/min (ref >60.00)
Glucose, Bld: 104 mg/dL — ABNORMAL HIGH (ref 70–99)
Potassium: 4.3 mEq/L (ref 3.5–5.1)
Sodium: 137 mEq/L (ref 135–145)
Total Bilirubin: 1.2 mg/dL (ref 0.2–1.2)
Total Protein: 6.9 g/dL (ref 6.0–8.3)

## 2015-07-14 LAB — CBC WITH DIFFERENTIAL/PLATELET
Basophils Absolute: 0 10*3/uL (ref 0.0–0.1)
Basophils Relative: 1 % (ref 0.0–3.0)
Eosinophils Absolute: 0.1 10*3/uL (ref 0.0–0.7)
Eosinophils Relative: 2.3 % (ref 0.0–5.0)
HCT: 47 % (ref 39.0–52.0)
Hemoglobin: 15.1 g/dL (ref 13.0–17.0)
Lymphocytes Relative: 28.5 % (ref 12.0–46.0)
Lymphs Abs: 1.4 10*3/uL (ref 0.7–4.0)
MCHC: 32.1 g/dL (ref 30.0–36.0)
MCV: 76.2 fl — ABNORMAL LOW (ref 78.0–100.0)
Monocytes Absolute: 0.6 10*3/uL (ref 0.1–1.0)
Monocytes Relative: 11.8 % (ref 3.0–12.0)
Neutro Abs: 2.8 10*3/uL (ref 1.4–7.7)
Neutrophils Relative %: 56.4 % (ref 43.0–77.0)
Platelets: 270 10*3/uL (ref 150.0–400.0)
RBC: 6.17 Mil/uL — ABNORMAL HIGH (ref 4.22–5.81)
RDW: 17 % — ABNORMAL HIGH (ref 11.5–15.5)
WBC: 4.9 10*3/uL (ref 4.0–10.5)

## 2015-07-14 LAB — LIPID PANEL
Cholesterol: 180 mg/dL (ref 0–200)
HDL: 87.8 mg/dL (ref >39.00)
LDL Cholesterol: 74 mg/dL (ref 0–99)
NonHDL: 92.6
Total CHOL/HDL Ratio: 2
Triglycerides: 95 mg/dL (ref 0.0–149.0)
VLDL: 19 mg/dL (ref 0.0–40.0)

## 2015-07-14 LAB — PSA: PSA: 0.49 ng/mL (ref 0.10–4.00)

## 2015-07-14 LAB — URIC ACID: Uric Acid, Serum: 6.5 mg/dL (ref 4.0–7.8)

## 2015-07-14 LAB — HEMOGLOBIN A1C: Hgb A1c MFr Bld: 6.1 % (ref 4.6–6.5)

## 2015-07-17 LAB — IGE: IgE (Immunoglobulin E), Serum: 15 kU/L (ref <115)

## 2015-07-18 ENCOUNTER — Ambulatory Visit (INDEPENDENT_AMBULATORY_CARE_PROVIDER_SITE_OTHER): Payer: PRIVATE HEALTH INSURANCE | Admitting: Family Medicine

## 2015-07-18 ENCOUNTER — Encounter: Payer: Self-pay | Admitting: Family Medicine

## 2015-07-18 VITALS — BP 142/86 | HR 87 | Temp 97.7°F | Ht 71.0 in | Wt 215.8 lb

## 2015-07-18 DIAGNOSIS — E785 Hyperlipidemia, unspecified: Secondary | ICD-10-CM | POA: Diagnosis not present

## 2015-07-18 DIAGNOSIS — Z0000 Encounter for general adult medical examination without abnormal findings: Secondary | ICD-10-CM | POA: Diagnosis not present

## 2015-07-18 DIAGNOSIS — M1A9XX0 Chronic gout, unspecified, without tophus (tophi): Secondary | ICD-10-CM

## 2015-07-18 DIAGNOSIS — Z23 Encounter for immunization: Secondary | ICD-10-CM

## 2015-07-18 DIAGNOSIS — R7309 Other abnormal glucose: Secondary | ICD-10-CM

## 2015-07-18 DIAGNOSIS — R7303 Prediabetes: Secondary | ICD-10-CM

## 2015-07-18 MED ORDER — PRIMIDONE 50 MG PO TABS: 50.0000 mg | ORAL_TABLET | Freq: Every day | ORAL | Status: DC

## 2015-07-18 NOTE — Progress Notes (Signed)
59 year old male presents for wellness visit.   Anxiety, moderate control: Anxious when he is having shortness of breath.  Some depression. Feels tired a lot.  Continues to have a tremor in hands. Dr. Delice Lesch neurology following. He feels it interferes with his job, cannot write.    Hypertension: Borderline  control on lisinopril/HCTZ .Marland Kitchen Heel feels it is elevated because he used albuterol. BP Readings from Last 3 Encounters:  07/18/15 142/86  06/29/15 132/74  06/27/15 136/88  Using medication without problems or lightheadedness: None  Chest pain with exertion:None  Edema:None  Short of breath:Improved  Average home BPs:not checking.  Other issues:   Elevated Cholesterol: Almost at goal <70 given CAD on zocor 20 mg.  Lab Results  Component Value Date   CHOL 180 07/14/2015   HDL 87.80 07/14/2015   LDLCALC 74 07/14/2015   LDLDIRECT 127.4 06/23/2009   TRIG 95.0 07/14/2015   CHOLHDL 2 07/14/2015   Using medications without problems:none  Muscle aches: some Diet compliance:Good  Exercise: Limited given breathing Other complaints:   COPD/asthma:  Followed by pulm, Dr. Lake Bells. On Qvar, symbicort and singuliar.  Using proventil for rescue off and on. Has history of 10 years of smoking meth amphetamine. No current drug of tobacco use.   Prediabetes: Improved.  Wt Readings from Last 3 Encounters:  07/18/15 215 lb 12 oz (97.864 kg)  06/29/15 219 lb (99.338 kg)  05/15/15 219 lb 8 oz (99.565 kg)    Lab Results  Component Value Date   HGBA1C 6.1 07/14/2015   Gout: No recent flares on allopurinol. Uric acid 6.5  Review of Systems  Constitutional: Negative for fever, fatigue and unexpected weight change.  HENT: Negative for ear pain, congestion, sore throat, rhinorrhea, trouble swallowing and postnasal drip.  Eyes: Negative for pain.  Respiratory: Negative for cough, shortness of breath and wheezing.  Cardiovascular: Negative for chest pain, palpitations  and leg swelling.  Gastrointestinal: Negative for nausea, abdominal pain, diarrhea, constipation and blood in stool.  Genitourinary: Negative for dysuria, urgency, hematuria, discharge, penile swelling, scrotal swelling, difficulty urinating, penile pain and testicular pain.  Skin: Negative for rash.  Neurological: Negative for syncope, weakness, light-headedness, numbness and headaches.  Psychiatric/Behavioral: Negative for behavioral problems and dysphoric mood. The patient is not nervous/anxious.  Objective:   Physical Exam  Constitutional: He appears well-developed and well-nourished. Non-toxic appearance. He does not appear ill. No distress.  HENT:  Head: Normocephalic and atraumatic.  Right Ear: Hearing, tympanic membrane, external ear and ear canal normal.  Left Ear: Hearing, tympanic membrane, external ear and ear canal normal.  Nose: Nose normal.  Mouth/Throat: Uvula is midline, oropharynx is clear and moist and mucous membranes are normal.  Eyes: Conjunctivae, EOM and lids are normal. Pupils are equal, round, and reactive to light. No foreign bodies found.  Neck: Trachea normal, normal range of motion and phonation normal. Neck supple. Carotid bruit is not present. No mass and no thyromegaly present.  Cardiovascular: Normal rate, regular rhythm, S1 normal, S2 normal, intact distal pulses and normal pulses. Exam reveals no gallop.  No murmur heard.  Pulmonary/Chest: Breath sounds normal. He has no wheezes. He has no rhonchi. He has no rales.  Abdominal: Soft. Normal appearance and bowel sounds are normal. There is no hepatosplenomegaly. There is no tenderness. There is no rebound, no guarding and no CVA tenderness. No hernia.  Lymphadenopathy:  He has no cervical adenopathy.  Neurological: He is alert. He has normal strength and normal reflexes. No cranial  nerve deficit or sensory deficit. Gait normal.  Skin: Skin is warm, dry and intact. No rash noted.   Psychiatric: He has a normal mood and affect. His speech is normal and behavior is normal. Judgment and thought content normal. Cognition and memory are normal.  Assessment & Plan:   The patient's preventative maintenance and recommended screening tests for an annual wellness exam were reviewed in full today.  Brought up to date unless services declined.  Counselled on the importance of diet, exercise, and its role in overall health and mortality.  The patient's FH and SH was reviewed, including their home life, tobacco status, and drug and alcohol status.   Colon: 03/2010, polyps .Marland KitchenScheduled to repeat with Dr. Bary Castilla in 08/2015.  Vaccines: Td 2011, PNA uptodate,  flu given. PSA: Sees Dr. Jacqlyn Larsen. Lab Results  Component Value Date   PSA 0.49 07/14/2015   PSA 0.48 12/20/2013   PSA 0.34 11/24/2012  ASA contraindicated in this pt with overdose in past.

## 2015-07-18 NOTE — Progress Notes (Signed)
Pre visit review using our clinic review tool, if applicable. No additional management support is needed unless otherwise documented below in the visit note. 

## 2015-07-18 NOTE — Patient Instructions (Addendum)
Follow BP at home. Goal < 140/90.  Start primidone for tremor.

## 2015-07-19 LAB — ASPERGILLUS IGE PANEL
Aspergillus Amstel/glauc IgE: <0.35 kU/L (ref <0.35)
Aspergillus IgE Panel: <0.1 kU/L (ref <0.35)
Aspergillus Terreus IgE: <0.35 kU/L (ref <0.35)
Aspergillus Versicolor IgE: <0.1 kU/L (ref <0.35)
Aspergillus flavus IgE: <0.35 kU/L (ref <0.35)
Aspergillus nidulans IgE: <0.35 kU/L (ref <0.35)
Class: 0
Class: 0
Class: 0
Class: 0
Class: 0
Class: 0
Class: 0
Results Received-AspIgE: <0.1 kU/L (ref <0.35)

## 2015-07-31 ENCOUNTER — Encounter: Payer: Self-pay | Admitting: Pulmonary Disease

## 2015-07-31 ENCOUNTER — Ambulatory Visit (INDEPENDENT_AMBULATORY_CARE_PROVIDER_SITE_OTHER): Payer: PRIVATE HEALTH INSURANCE | Admitting: Pulmonary Disease

## 2015-07-31 VITALS — BP 120/82 | HR 102 | Ht 71.0 in | Wt 216.4 lb

## 2015-07-31 DIAGNOSIS — J4530 Mild persistent asthma, uncomplicated: Secondary | ICD-10-CM | POA: Diagnosis not present

## 2015-07-31 NOTE — Progress Notes (Signed)
Subjective:    Patient ID: Cody Day, male    DOB: 10-Jul-1956, 59 y.o.   MRN: [id number]  Synopsis: Christropher Gintz first saw the [facility] pulmonary clinic in the spring of 2015 for shortness of breath, cough, mucus production. He had lung function testing in 2014 which was completely normal. He had a past history significant for long-term inhaled illicit drug use with multiple substances including methamphetamine.  HPI Chief Complaint  Patient presents with  . Follow-up    pt following  up asthma, pt states he has gotten better since he last been here.  no concerns at this time.    Breathing is better than before.  Not having to use his inhaler much, only twice (albuterol) in the last two weeks. The QVar is helping, so is the singulair. He can really tell when he doesn't take the singulair due to chest congestion   Past Medical History  Diagnosis Date  . Hypertension   . Asthma   . Gout   . Syncope 10/11    in settin gof asthma exacerbation w coughing. Ech (10/11): EF > 55%, mild LVH, grade I diastolic dysfunction, nomral RV size and systolic function,. normal valves. Carotid US (10/11): minimal disease  . Achilles tendon rupture 10/11    and repair  . Sleep apnea   . Hemorrhoids   . Hypercholesteremia   . Adenomatous polyp of colon 04/06/2010    Adenomatous polyps 3 in the descending colon, times one in the ascending colon [facility])       Review of Systems  Constitutional: Negative for fever, chills and fatigue.  HENT: Negative for postnasal drip, rhinorrhea and sinus pressure.   Respiratory: Positive for shortness of breath. Negative for cough and wheezing.   Cardiovascular: Negative for chest pain and palpitations.       Objective:   Physical Exam Filed Vitals:   07/31/15 1545  BP: 120/82  Pulse: 102  Height: 5\' 11"  (1.803 m)  Weight: 216 lb 6.4 oz (98.158 kg)  SpO2: 98%   RA  Gen: well appearing, no acute distress HEENT: NCAT, EOMi, OP  clear, PULM: CTA B CV: RRR, no mgr, no JVD AB: BS+, soft, nontender, no hsm Ext: warm, no edema, no clubbing, no cyanosis  2011 echocardiogram results showed mild LVH and an EF of 55%, normal RV size and function High-resolution CT chest from 2016 showed normal pulmonary parenchyma but three-vessel coronary disease Pulmonary function testing from 2016 showed mild airflow obstruction with a 16% improvement with broncho-dilator, increased DLCO and total lung capacity Nuclear stress test from 2016 showed no evidence of ischemia but an LVEF of 35-40% which was new Cardiology records reviewed where he was initially evaluated for presumed cardiomyopathy but a follow-up echocardiogram showed his LVEF was actually normal.    Assessment & Plan:   Asthma, chronic He has done much better since adding Qvar and Singulair. It's not clear to me if this is because of the small particle size of Qvar or the addition of the anti-leukotriene treatment with the Singulair. However, he's done much better. My suspicion is that he will need multiple therapies in the late spring through early fall as this is typical for him, but he could possibly be maintained on Qvar and Singulair throughout the rest of the year. Plan: Slowly wean off Symbicort over the next month, I gave them specific instructions today, however if he gets worse I want him to start back Continue Qvar 2 puffs twice a  day of the 80 g dose Continue Singulair Follow-up 6 months or sooner if needed, I suspect he will need to start back on Symbicort in May 2017 as his symptoms are typically worse in the early summer.    Updated Medication List Outpatient Encounter Prescriptions as of 07/31/2015  Medication Sig  . albuterol (PROVENTIL HFA;VENTOLIN HFA) 108 (90 BASE) MCG/ACT inhaler Inhale 2 puffs into the lungs every 6 (six) hours as needed for wheezing or shortness of breath.  . allopurinol (ZYLOPRIM) 100 MG tablet TAKE ONE (1) TABLET EACH DAY  . B  Complex-C (SUPER B COMPLEX PO) Take 1 tablet by mouth daily.  . beclomethasone (QVAR) 80 MCG/ACT inhaler Inhale 2 puffs into the lungs 2 (two) times daily.  . Cinnamon 500 MG capsule Take 1,000 mg by mouth daily.   . Garlic 233 MG TABS Take 1 tablet by mouth daily.  Marland Kitchen lisinopril (PRINIVIL,ZESTRIL) 20 MG tablet TAKE ONE (1) TABLET EACH DAY  . montelukast (SINGULAIR) 10 MG tablet Take 1 tablet (10 mg total) by mouth daily.  . Multiple Vitamins-Minerals (MULTIVITAL) tablet Take 1 tablet by mouth daily.    . primidone (MYSOLINE) 50 MG tablet Take 1 tablet (50 mg total) by mouth daily.  . simvastatin (ZOCOR) 20 MG tablet TAKE ONE TABLET AT BEDTIME  . Spacer/Aero-Holding Chambers (AEROCHAMBER MV) inhaler Use as instructed  . SYMBICORT 160-4.5 MCG/ACT inhaler USE 2 PUFFS TWICE DAILY  . testosterone cypionate (DEPOTESTOTERONE CYPIONATE) 100 MG/ML injection Inject 100 mg into the muscle every 7 (seven) days. On Fridays only uses 1/2 ml  For IM use only  . tiZANidine (ZANAFLEX) 4 MG capsule Take 2 mg by mouth as needed for muscle spasms.  . polyethylene glycol powder (GLYCOLAX/MIRALAX) powder 255 grams one bottle for colonoscopy prep (Patient not taking: Reported on 07/18/2015)   No facility-administered encounter medications on file as of 07/31/2015.

## 2015-07-31 NOTE — Assessment & Plan Note (Signed)
He has done much better since adding Qvar and Singulair. It's not clear to me if this is because of the small particle size of Qvar or the addition of the anti-leukotriene treatment with the Singulair. However, he's done much better. My suspicion is that he will need multiple therapies in the late spring through early fall as this is typical for him, but he could possibly be maintained on Qvar and Singulair throughout the rest of the year. Plan: Slowly wean off Symbicort over the next month, I gave them specific instructions today, however if he gets worse I want him to start back Continue Qvar 2 puffs twice a day of the 80 g dose Continue Singulair Follow-up 6 months or sooner if needed, I suspect he will need to start back on Symbicort in May 2017 as his symptoms are typically worse in the early summer.

## 2015-07-31 NOTE — Patient Instructions (Signed)
Take 1 puff of Symbicort twice a day for a week, if you're doing okay then take 1 puff daily for a week, if you're doing okay then stop altogether  Keep taking the Qvar as you're doing Keep taking the Singulair as you're doing We will see you back in 6 months or sooner if needed

## 2015-08-01 ENCOUNTER — Encounter: Payer: Self-pay | Admitting: Family Medicine

## 2015-08-01 NOTE — Assessment & Plan Note (Signed)
Improving control. 

## 2015-08-01 NOTE — Assessment & Plan Note (Signed)
Not at goal  On zocor 20 mg daily. Reviewed lifestyle changes, pt will work more aggressively on lifestyle changes.

## 2015-08-01 NOTE — Assessment & Plan Note (Addendum)
Dx made per neuro. Didi not treat with med in apst given concern about memory changes.  Pt wants to try a trial of primidone as tremor bothers him greatly. He understands risk of SE>

## 2015-08-01 NOTE — Assessment & Plan Note (Signed)
No recent flares on allopurinol. Uric acid 6.5

## 2015-08-18 ENCOUNTER — Encounter: Payer: Self-pay | Admitting: Family Medicine

## 2015-08-18 ENCOUNTER — Ambulatory Visit (INDEPENDENT_AMBULATORY_CARE_PROVIDER_SITE_OTHER): Payer: PRIVATE HEALTH INSURANCE | Admitting: Family Medicine

## 2015-08-18 VITALS — BP 120/80 | HR 81 | Temp 99.1°F | Ht 71.0 in | Wt 214.5 lb

## 2015-08-18 DIAGNOSIS — G250 Essential tremor: Secondary | ICD-10-CM

## 2015-08-18 MED ORDER — PRIMIDONE 50 MG PO TABS: 50.0000 mg | ORAL_TABLET | Freq: Two times a day (BID) | ORAL | Status: DC

## 2015-08-18 NOTE — Progress Notes (Signed)
   Subjective:    Patient ID: Cody Day, male    DOB: 1956-10-18, 59 y.o.   MRN: [id number]  HPI  59 year old male presents for  1 month follow up benign essential tremor.  In past has seen Dr. Delice Lesch for this. Helpd meds to treat as felt could worsen memory issues, but at last OV given tremor ifs interfering with life, work Estate agent, eating.  On 07/18/2015 started on  Primidone 50 mg daily.   Today he reports 50% improvement in tremor. He feels slightly lightheaded, no change in memory issues, no increase in fatigue. He can now write much better.  He feels less anxious.  Social History /Family History/Past Medical History reviewed and updated if needed. Asthma: Breathing much better  Now.  Review of Systems  Constitutional: Negative for fever and fatigue.  HENT: Negative for ear pain.   Eyes: Negative for pain.  Respiratory: Negative for cough and shortness of breath.   Cardiovascular: Negative for chest pain.       Objective:   Physical Exam  Constitutional: Vital signs are normal. He appears well-developed and well-nourished.  HENT:  Head: Normocephalic.  Right Ear: Hearing normal.  Left Ear: Hearing normal.  Nose: Nose normal.  Mouth/Throat: Oropharynx is clear and moist and mucous membranes are normal.  Neck: Trachea normal. Carotid bruit is not present. No thyroid mass and no thyromegaly present.  Cardiovascular: Normal rate, regular rhythm and normal pulses.  Exam reveals no gallop, no distant heart sounds and no friction rub.   No murmur heard. No peripheral edema  Pulmonary/Chest: Effort normal and breath sounds normal. No respiratory distress.  Neurological: He has normal strength. No cranial nerve deficit or sensory deficit. He displays a negative Romberg sign. Coordination and gait normal.  Decreased tremor.  Skin: Skin is warm, dry and intact. No rash noted.  Psychiatric: He has a normal mood and affect. His speech is normal and behavior is normal. Thought  content normal.          Assessment & Plan:

## 2015-08-18 NOTE — Assessment & Plan Note (Signed)
Significant improvement ( can eat and write now) with 50 mg of mysoline, mild SE. Pt would liek to try higher dose to see if further improvement.

## 2015-08-18 NOTE — Patient Instructions (Signed)
Increase to 50 mg twice daily of mysoline. If increase in SE , can decrease back to once daily.  Can sooner if new issues arise.

## 2015-08-18 NOTE — Progress Notes (Signed)
Pre visit review using our clinic review tool, if applicable. No additional management support is needed unless otherwise documented below in the visit note. 

## 2015-08-23 ENCOUNTER — Telehealth: Payer: Self-pay | Admitting: *Deleted

## 2015-08-23 NOTE — Telephone Encounter (Signed)
Patient states that he was recently placed on primidone for tremors by Dr. Diona Browner. All other medications are the same since patient's last office visit.   He also reports that he has Miralax prescription.   We will proceed as scheduled with colonoscopy on 08-30-15 at [facility].   Patient instructed to call the office should he have further questions.

## 2015-08-29 ENCOUNTER — Encounter: Payer: Self-pay | Admitting: *Deleted

## 2015-08-30 ENCOUNTER — Ambulatory Visit: Payer: PRIVATE HEALTH INSURANCE | Admitting: Anesthesiology

## 2015-08-30 ENCOUNTER — Ambulatory Visit
Admission: RE | Admit: 2015-08-30 | Discharge: 2015-08-30 | Disposition: A | Discharge status: Home / Self Care | Payer: PRIVATE HEALTH INSURANCE | Source: Ambulatory Visit | Attending: General Surgery | Admitting: General Surgery

## 2015-08-30 ENCOUNTER — Encounter: Payer: Self-pay | Admitting: *Deleted

## 2015-08-30 ENCOUNTER — Encounter
Admission: RE | Disposition: A | Discharge status: Home / Self Care | Payer: Self-pay | Source: Ambulatory Visit | Attending: General Surgery

## 2015-08-30 DIAGNOSIS — M109 Gout, unspecified: Secondary | ICD-10-CM | POA: Diagnosis not present

## 2015-08-30 DIAGNOSIS — Z7951 Long term (current) use of inhaled steroids: Secondary | ICD-10-CM | POA: Insufficient documentation

## 2015-08-30 DIAGNOSIS — I10 Essential (primary) hypertension: Secondary | ICD-10-CM | POA: Insufficient documentation

## 2015-08-30 DIAGNOSIS — E669 Obesity, unspecified: Secondary | ICD-10-CM | POA: Diagnosis not present

## 2015-08-30 DIAGNOSIS — J45901 Unspecified asthma with (acute) exacerbation: Secondary | ICD-10-CM | POA: Diagnosis not present

## 2015-08-30 DIAGNOSIS — E7800 Pure hypercholesterolemia, unspecified: Secondary | ICD-10-CM | POA: Diagnosis not present

## 2015-08-30 DIAGNOSIS — Z9889 Other specified postprocedural states: Secondary | ICD-10-CM | POA: Insufficient documentation

## 2015-08-30 DIAGNOSIS — Z880 Allergy status to penicillin: Secondary | ICD-10-CM | POA: Diagnosis not present

## 2015-08-30 DIAGNOSIS — Z86010 Personal history of colonic polyps: Secondary | ICD-10-CM | POA: Diagnosis not present

## 2015-08-30 DIAGNOSIS — D125 Benign neoplasm of sigmoid colon: Secondary | ICD-10-CM | POA: Diagnosis not present

## 2015-08-30 DIAGNOSIS — Z87442 Personal history of urinary calculi: Secondary | ICD-10-CM | POA: Insufficient documentation

## 2015-08-30 DIAGNOSIS — Z87891 Personal history of nicotine dependence: Secondary | ICD-10-CM | POA: Insufficient documentation

## 2015-08-30 DIAGNOSIS — G4730 Sleep apnea, unspecified: Secondary | ICD-10-CM | POA: Insufficient documentation

## 2015-08-30 DIAGNOSIS — K621 Rectal polyp: Secondary | ICD-10-CM | POA: Diagnosis not present

## 2015-08-30 DIAGNOSIS — Z79899 Other long term (current) drug therapy: Secondary | ICD-10-CM | POA: Insufficient documentation

## 2015-08-30 DIAGNOSIS — R55 Syncope and collapse: Secondary | ICD-10-CM | POA: Diagnosis not present

## 2015-08-30 HISTORY — PX: COLONOSCOPY WITH PROPOFOL: SHX5780

## 2015-08-30 SURGERY — COLONOSCOPY WITH PROPOFOL
Anesthesia: General

## 2015-08-30 MED ORDER — PROPOFOL 500 MG/50ML IV EMUL
INTRAVENOUS | Status: DC | PRN
Start: 2015-08-30 — End: 2015-08-30
  Administered 2015-08-30: 160 ug/kg/min via INTRAVENOUS

## 2015-08-30 MED ORDER — SODIUM CHLORIDE 0.9 % IV SOLN
INTRAVENOUS | Status: DC
  Administered 2015-08-30: 1000 mL via INTRAVENOUS

## 2015-08-30 MED ORDER — MIDAZOLAM HCL 2 MG/2ML IJ SOLN
INTRAMUSCULAR | Status: DC | PRN
  Administered 2015-08-30: 2 mg via INTRAVENOUS

## 2015-08-30 MED ORDER — LIDOCAINE HCL (CARDIAC) 20 MG/ML IV SOLN
INTRAVENOUS | Status: DC | PRN
  Administered 2015-08-30: 80 mg via INTRAVENOUS

## 2015-08-30 NOTE — Transfer of Care (Signed)
Immediate Anesthesia Transfer of Care Note  Patient: Cody Day  Procedure(s) Performed: Procedure(s): COLONOSCOPY WITH PROPOFOL (N/A)  Patient Location: PACU  Anesthesia Type:General  Level of Consciousness: sedated  Airway & Oxygen Therapy: Patient Spontanous Breathing and Patient connected to nasal cannula oxygen  Post-op Assessment: Report given to RN and Post -op Vital signs reviewed and stable  Post vital signs: Reviewed and stable   Last Vitals: 9:48 97 temp 82hr 99% 16resp 116/80 Filed Vitals:   08/30/15 0858  BP: 154/104  Pulse: 77  Temp: 37.1 C  Resp: 20    Complications: No apparent anesthesia complications

## 2015-08-30 NOTE — H&P (Signed)
Cody Day [id number] 03/03/1956     HPI: Past history colonic polyps. For f/u exam. Tolerated prep well. Respiratory status stable.   Prescriptions prior to admission  Medication Sig Dispense Refill Last Dose  . beclomethasone (QVAR) 80 MCG/ACT inhaler Inhale 2 puffs into the lungs 2 (two) times daily. 1 Inhaler 6 08/30/2015 at 0630  . lisinopril (PRINIVIL,ZESTRIL) 20 MG tablet TAKE ONE (1) TABLET EACH DAY 30 tablet 11 08/30/2015 at 0630  . SYMBICORT 160-4.5 MCG/ACT inhaler USE 2 PUFFS TWICE DAILY 10.2 g 11 08/30/2015 at 0630  . albuterol (PROVENTIL HFA;VENTOLIN HFA) 108 (90 BASE) MCG/ACT inhaler Inhale 2 puffs into the lungs every 6 (six) hours as needed for wheezing or shortness of breath. 1 Inhaler 3 Taking  . allopurinol (ZYLOPRIM) 100 MG tablet TAKE ONE (1) TABLET EACH DAY 30 tablet 11 Taking  . B Complex-C (SUPER B COMPLEX PO) Take 1 tablet by mouth daily.   Taking  . Cinnamon 500 MG capsule Take 1,000 mg by mouth daily.    Taking  . Garlic 631 MG TABS Take 1 tablet by mouth daily.   Taking  . montelukast (SINGULAIR) 10 MG tablet Take 1 tablet (10 mg total) by mouth daily. 30 tablet 2 Taking  . Multiple Vitamins-Minerals (MULTIVITAL) tablet Take 1 tablet by mouth daily.     Taking  . polyethylene glycol powder (GLYCOLAX/MIRALAX) powder 255 grams one bottle for colonoscopy prep 255 g 0 Taking  . primidone (MYSOLINE) 50 MG tablet Take 1 tablet (50 mg total) by mouth 2 (two) times daily. 60 tablet 3   . simvastatin (ZOCOR) 20 MG tablet TAKE ONE TABLET AT BEDTIME 30 tablet 11 Taking  . Spacer/Aero-Holding Chambers (AEROCHAMBER MV) inhaler Use as instructed 1 each 0 Taking  . testosterone cypionate (DEPOTESTOTERONE CYPIONATE) 100 MG/ML injection Inject 100 mg into the muscle every 7 (seven) days. On Fridays only uses 1/2 ml  For IM use only   Taking  . tiZANidine (ZANAFLEX) 4 MG capsule Take 2 mg by mouth as needed for muscle spasms.   Taking   Allergies  Allergen Reactions  .  Penicillins     REACTION: (Childhood - bumps, rash)   Past Medical History  Diagnosis Date  . Hypertension   . Asthma   . Gout   . Syncope 10/11    in settin gof asthma exacerbation w coughing. Ech (10/11): EF > 55%, mild LVH, grade I diastolic dysfunction, nomral RV size and systolic function,. normal valves. Carotid US (10/11): minimal disease  . Achilles tendon rupture 10/11    and repair  . Sleep apnea   . Hemorrhoids   . Hypercholesteremia   . Adenomatous polyp of colon 04/06/2010    Adenomatous polyps 3 in the descending colon, times one in the ascending colon [facility])   Past Surgical History  Procedure Laterality Date  . Treadmill stress test  2003  . Intestinal blockage      as a child  . Abdominal hernia repair  [id number]  . Femoral hernia repair  2007  . Left heart cath  2008    minimal luminal irregularities EF 55%  . Kidney stone extraction      unic acid stones  . Calcium kidney stones    . Colonoscopy  2011    [facility], [place], [place]; Dr. [name]. Multiple adenomatous polyps (total 4).   . Hemorrhoid surgery    . Hernia repair  09/10/2010    Repair of recurrent ventral hernia, resection of  previously placed mesh x2, implantation 7.5 x 10 cm Gore-Tex dual mesh in an underlay position, repair of epigastric hernia with a 4.2 cm Proceed ventral patch.  . Hernia repair   02//28/2007    Laparoscopic right inguinal hernia repair with Surgipro mesh, Ventrilex patch at umbilicus  . Hernia repair  11/01/1990  .    Small oval Kugel patch placed in preperitoneal space, [name], MD  . Hernia repair  01/17/1997    Recurrent ventral hernia 15 x 19 Gore-Tex dual mesh placed laparoscopically with multiple lefft--sided ports, [name], MD  . Hernia repair  01/07/1995     Primary repair of ventral hernia. [name], MD  . Cardiac catheterization      [place] History   Social History  . Marital Status: Married    Spouse Name:  N/A  . Number of Children: Y  . Years of Education: N/A   Occupational History  . woodmilling specialist    Social History Main Topics  . Smoking status: Former Smoker -- 0.25 packs/day for 35 years    Types: Cigarettes    Quit date: 10/22/1995  . Smokeless tobacco: Current User    Types: Chew     Comment: rarely smoked cigarettes and cigars.  states he smoked drugs mostly  . Alcohol Use: 0.0 oz/week    0 Standard drinks or equivalent per week     Comment: 6 beers daily; used to use cocaine and methamphetamine  . Drug Use: No     Comment: former smokerx 27 years  . Sexual Activity: Not on file   Other Topics Concern  . Not on file   Social History Narrative   Poor diet, lots of fats. Does not regularly exercise. Married. Originally from [place].    Social History   Social History Narrative   Poor diet, lots of fats. Does not regularly exercise. Married. Originally from [place].      ROS: Negative.     PE: HEENT: Negative. Lungs: Clear. Cardio: RR. Cody Day 08/30/2015   Assessment/Plan:  Proceed with planned endoscopy.

## 2015-08-30 NOTE — Anesthesia Preprocedure Evaluation (Signed)
Anesthesia Evaluation  Patient identified by MRN, date of birth, ID band Patient awake    Reviewed: Allergy & Precautions, NPO status , Patient's Chart, lab work & pertinent test results  Airway Mallampati: II       Dental  (+) Teeth Intact   Pulmonary asthma , sleep apnea , former smoker,     + decreased breath sounds      Cardiovascular hypertension, Pt. on medications Normal cardiovascular exam     Neuro/Psych    GI/Hepatic negative GI ROS, Neg liver ROS,   Endo/Other  negative endocrine ROS  Renal/GU negative Renal ROS     Musculoskeletal negative musculoskeletal ROS (+)   Abdominal (+) + obese,   Peds  Hematology negative hematology ROS (+)   Anesthesia Other Findings   Reproductive/Obstetrics                             Anesthesia Physical Anesthesia Plan  ASA: II  Anesthesia Plan: General   Post-op Pain Management:    Induction: Intravenous  Airway Management Planned: Nasal Cannula  Additional Equipment:   Intra-op Plan:   Post-operative Plan:   Informed Consent: I have reviewed the patients History and Physical, chart, labs and discussed the procedure including the risks, benefits and alternatives for the proposed anesthesia with the patient or authorized representative who has indicated his/her understanding and acceptance.     Plan Discussed with: CRNA  Anesthesia Plan Comments:         Anesthesia Quick Evaluation

## 2015-08-30 NOTE — Op Note (Signed)
[facility] Patient Name: Cody Day Procedure Date: 08/30/2015 9:23 AM MRN: [id number] Account #: [id number] Date of Birth: Mar 10, 1956 Admit Type: Outpatient Age: 59 Room: [facility] ENDO ROOM 1 Gender: Male Note Status: Finalized Procedure:         Colonoscopy Indications:       High risk colon cancer surveillance: Personal history of                     colonic polyps Providers:         [name], MD Referring MD:      [name], MD (Referring MD) Medicines:         Monitored Anesthesia Care Procedure:         Pre-Anesthesia Assessment:                    - Prior to the procedure, a History and Physical was                     performed, and patient medications, allergies and                     sensitivities were reviewed. The patient's tolerance of                     previous anesthesia was reviewed.                    - The risks and benefits of the procedure and the sedation                     options and risks were discussed with the patient. All                     questions were answered and informed consent was obtained.                    After obtaining informed consent, the colonoscope was                     passed under direct vision. Throughout the procedure, the                     patient's blood pressure, pulse, and oxygen saturations                     were monitored continuously. The Colonoscope was                     introduced through the anus and advanced to the the cecum,                     identified by appendiceal orifice and ileocecal valve. The                     colonoscopy was performed without difficulty. The patient                     tolerated the procedure well. The quality of the bowel                     preparation was excellent. Findings:      Two sessile polyps were found in the sigmoid colon. The polyps were 5 mm       in size. These  were biopsied with a cold forceps for histology.      A  5 mm polyp was found in the rectum. The polyp was sessile. Biopsies       were taken with a cold forceps for histology.      The retroflexed view of the distal rectum and anal verge was normal and       showed no anal or rectal abnormalities. Impression:        - The entire examined colon is normal.                    - No specimens collected. Recommendation:    - Telephone endoscopist for pathology results in 1 week.                    - Repeat colonoscopy in 5 years for surveillance. Procedure Code(s): --- Professional ---                    [phone], Colonoscopy, flexible; with biopsy, single or                     multiple Diagnosis Code(s): --- Professional ---                    Z86.010, Personal history of colonic polyps CPT copyright 2014 American Medical Association. All rights reserved. The codes documented in this report are preliminary and upon coder review may  be revised to meet current compliance requirements. [name], MD 08/30/2015 9:46:05 AM This report has been signed electronically. Number of Addenda: 0 Note Initiated On: 08/30/2015 9:23 AM Scope Withdrawal Time: 0 hours 8 minutes 53 seconds  Total Procedure Duration: 0 hours 15 minutes 9 seconds       [facility]

## 2015-08-30 NOTE — Anesthesia Postprocedure Evaluation (Signed)
  Anesthesia Post-op Note  Patient: Cody Day  Procedure(s) Performed: Procedure(s): COLONOSCOPY WITH PROPOFOL (N/A)  Anesthesia type:General  Patient location: PACU  Post pain: Pain level controlled  Post assessment: Post-op Vital signs reviewed, Patient's Cardiovascular Status Stable, Respiratory Function Stable, Patent Airway and No signs of Nausea or vomiting  Post vital signs: Reviewed and stable  Last Vitals:  Filed Vitals:   08/30/15 0948  BP: 116/80  Pulse: 83  Temp:   Resp: 16    Level of consciousness: awake, alert  and patient cooperative  Complications: No apparent anesthesia complications

## 2015-09-01 ENCOUNTER — Telehealth: Payer: Self-pay

## 2015-09-01 LAB — SURGICAL PATHOLOGY

## 2015-09-01 NOTE — Telephone Encounter (Signed)
-----   Message from [name], MD sent at 09/01/2015 11:45 AM EST ----- Please notify polyp benign, but he should have a follow up exam in five years. Please place in recalls. Thanks. ----- Message -----    From: [facility]    Sent: 09/01/2015  11:03 AM      To: [name], MD

## 2015-09-04 ENCOUNTER — Encounter: Payer: Self-pay | Admitting: General Surgery

## 2015-09-05 NOTE — Telephone Encounter (Signed)
Mrs. Register returned call from our office regarding her husband's colonoscopy results. She was advised per Dr. Bary Castilla that the polyp was benign and that he should have a follow up exam in 5 years. She was also advised that he would be placed in recall and would receive a letter from our office at that time for a follow up with Dr. Bary Castilla. She verbalized her understanding.

## 2015-09-21 ENCOUNTER — Other Ambulatory Visit: Payer: Self-pay

## 2015-09-21 MED ORDER — MONTELUKAST SODIUM 10 MG PO TABS: 10.0000 mg | ORAL_TABLET | Freq: Every day | ORAL | Status: DC

## 2015-11-28 ENCOUNTER — Ambulatory Visit (INDEPENDENT_AMBULATORY_CARE_PROVIDER_SITE_OTHER): Payer: Managed Care, Other (non HMO) | Admitting: Pulmonary Disease

## 2015-11-28 ENCOUNTER — Encounter: Payer: Self-pay | Admitting: Pulmonary Disease

## 2015-11-28 ENCOUNTER — Other Ambulatory Visit: Payer: Self-pay

## 2015-11-28 VITALS — BP 140/80 | HR 90 | Temp 99.0°F | Ht 71.0 in | Wt 210.0 lb

## 2015-11-28 DIAGNOSIS — J4531 Mild persistent asthma with (acute) exacerbation: Secondary | ICD-10-CM

## 2015-11-28 MED ORDER — PREDNISONE 10 MG PO TABS: ORAL_TABLET | ORAL | Status: DC

## 2015-11-28 NOTE — Progress Notes (Signed)
Subjective:    Patient ID: Cody Day, male    DOB: 18-Nov-1955, 60 y.o.   MRN: [id number]  Synopsis: Cody Day first saw the [facility] pulmonary clinic in the spring of 2015 for shortness of breath, cough, mucus production. He had lung function testing in 2014 which was completely normal. He had a past history significant for long-term inhaled illicit drug use with multiple substances including methamphetamine.  HPI Chief Complaint  Patient presents with  . Acute Visit    pt c/o sometimes prod cough with light yellow mucus, chest tightness, shortness of breath, runny nose X1 wk.  was given levaquin and prednisone at [facility].     Cody Day is here for an acute visit. He reports he started having wheezing and dyspnea on 1/30. He went to urgent care the following Monday and was given nebulizer treatment and prescribed Levaquin and prednisone. Two doses into Levaquin, he started having red skin, flushing, headache. No itching. He completed 6 days of the Levaquin. He started having cough 4 days ago that is productive of clear to yellow thick sputum. He has rhinorrhea and post nasal drip. No sinus congestion or pressure. No hemoptysis. No fevers chills, nausea, vomiting, myalgias. No sick contacts. Similar to past episodes of asthma flare. He is taking Singulair, QVAR, Symbicort, and albuterol as needed.   Past Medical History  Diagnosis Date  . Hypertension   . Asthma   . Gout   . Syncope 10/11    in settin gof asthma exacerbation w coughing. Ech (10/11): EF > 55%, mild LVH, grade I diastolic dysfunction, nomral RV size and systolic function,. normal valves. Carotid US (10/11): minimal disease  . Achilles tendon rupture 10/11    and repair  . Sleep apnea   . Hemorrhoids   . Hypercholesteremia   . Adenomatous polyp of colon 04/06/2010    Adenomatous polyps 3 in the descending colon, times one in the ascending colon [facility])       Review of Systems  Constitutional: Positive  for fatigue. Negative for fever and chills.  HENT: Positive for postnasal drip and rhinorrhea. Negative for congestion and sinus pressure.   Respiratory: Positive for cough and shortness of breath. Negative for wheezing.   Cardiovascular: Negative for chest pain and palpitations.  Gastrointestinal: Negative for nausea and vomiting.  Musculoskeletal: Negative for myalgias.   Objective:   Physical Exam Filed Vitals:   11/28/15 1635  BP: 140/80  Pulse: 90  Temp: 99 F (37.2 C)  TempSrc: Oral  Height: 5\' 11"  (1.803 m)  Weight: 210 lb (95.255 kg)  SpO2: 98%   RA  Gen: well appearing, no acute distress HEENT: NCAT, EOMi, OP clear, PULM: CTA B CV: RRR, no mgr, no JVD AB: BS+, soft, nontender, no hsm Ext: warm, no edema, no clubbing, no cyanosis  2011 echocardiogram results showed mild LVH and an EF of 55%, normal RV size and function High-resolution CT chest from 2016 showed normal pulmonary parenchyma but three-vessel coronary disease Pulmonary function testing from 2016 showed mild airflow obstruction with a 16% improvement with broncho-dilator, increased DLCO and total lung capacity Nuclear stress test from 2016 showed no evidence of ischemia but an LVEF of 35-40% which was new Cardiology records reviewed where he was initially evaluated for presumed cardiomyopathy but a follow-up echocardiogram showed his LVEF was actually normal.    Assessment & Plan:   Asthma, chronic Exacerbation of chronic asthma. He is still having dyspnea. Completed 5 days of prednisone and 6  days of Levaquin. Likely having delayed resolution of his exacerbation.  Plan: Continue Symbicort BID, Pulmicort BID and albuterol as needed Long prednisone taper Follow up as previously scheduled   Jacques Earthly, MD  Internal Medicine PGY-2  Attending:  I have seen and examined the patient with nurse practitioner/resident and agree with the note above.  We formulated the plan together and I elicited the  following history.    Persistent dyspnea after a flare up, mucus clear  Perhaps a mild wheeze on exam, but otherwise normal air movement  Agree we should extend prednisone for this flareup.  In regards to his severe persistent asthma, he has had multiple exacerbations in the last year and unfortunately is unable to be controlled on Qvar alone. I believe that he needs both Symbicort as well as Qvar as this commendation has provided him with the best symptom relief and kept him out of the clinic with flareups. I think we will need to apply for prior authorization for QVar. The rationale is that Qvar is a small particle size and is effective with the smaller airways which are often active and asthma.  Roselie Awkward, MD [facility] Pager: [phone] Cell: [phone] After 3pm or if no response, call [phone]   11/28/2015 5:33 PM  Updated Medication List Outpatient Encounter Prescriptions as of 11/28/2015  Medication Sig  . albuterol (PROVENTIL HFA;VENTOLIN HFA) 108 (90 BASE) MCG/ACT inhaler Inhale 2 puffs into the lungs every 6 (six) hours as needed for wheezing or shortness of breath.  . allopurinol (ZYLOPRIM) 100 MG tablet TAKE ONE (1) TABLET EACH DAY  . B Complex-C (SUPER B COMPLEX PO) Take 1 tablet by mouth daily.  . beclomethasone (QVAR) 80 MCG/ACT inhaler Inhale 2 puffs into the lungs 2 (two) times daily.  . Cinnamon 500 MG capsule Take 1,000 mg by mouth daily.   . Garlic 123XX123 MG TABS Take 1 tablet by mouth daily.  Marland Kitchen lisinopril (PRINIVIL,ZESTRIL) 20 MG tablet TAKE ONE (1) TABLET EACH DAY  . montelukast (SINGULAIR) 10 MG tablet Take 1 tablet (10 mg total) by mouth daily.  . Multiple Vitamins-Minerals (MULTIVITAL) tablet Take 1 tablet by mouth daily.    . primidone (MYSOLINE) 50 MG tablet Take 1 tablet (50 mg total) by mouth 2 (two) times daily.  . simvastatin (ZOCOR) 20 MG tablet TAKE ONE TABLET AT BEDTIME  . Spacer/Aero-Holding Chambers (AEROCHAMBER MV) inhaler Use as instructed  .  SYMBICORT 160-4.5 MCG/ACT inhaler USE 2 PUFFS TWICE DAILY  . testosterone cypionate (DEPOTESTOTERONE CYPIONATE) 100 MG/ML injection Inject 100 mg into the muscle every 7 (seven) days. On Fridays only uses 1/2 ml  For IM use only  . tiZANidine (ZANAFLEX) 4 MG capsule Take 2 mg by mouth as needed for muscle spasms.  . predniSONE (DELTASONE) 10 MG tablet Take 4 tablets (40 mg) by mouth daily with breakfast for 3 days. 3 tabs daily for 3 days. 2 tabs daily for 3 days. 1 tab daily for 3 days.  . [DISCONTINUED] polyethylene glycol powder (GLYCOLAX/MIRALAX) powder 255 grams one bottle for colonoscopy prep (Patient not taking: Reported on 11/28/2015)   No facility-administered encounter medications on file as of 11/28/2015.

## 2015-11-28 NOTE — Patient Instructions (Addendum)
Please take your prednisone: Take 4 tablets (40 mg total) by mouth daily with breakfast for three days. Then, take 3 tablets (30 mg total) by mouth daily with breakfast for three days. Then, take 2 tablets (20 mg total) by mouth daily with breakfast for three days. Then, take 1 tablet (10 mg total) by mouth daily with breakfast for three days. Continue your inhalers as you have been Call the clinic if you have worsening of your symptoms, fever, vomiting Keep your previously scheduled follow up appointment

## 2015-11-28 NOTE — Assessment & Plan Note (Signed)
Exacerbation of chronic asthma. He is still having dyspnea. Completed 5 days of prednisone and 6 days of Levaquin. Likely having delayed resolution of his exacerbation.  Plan: Continue Symbicort BID, Pulmicort BID and albuterol as needed Long prednisone taper Follow up as previously scheduled

## 2015-12-12 ENCOUNTER — Ambulatory Visit (INDEPENDENT_AMBULATORY_CARE_PROVIDER_SITE_OTHER)
Admission: RE | Admit: 2015-12-12 | Discharge: 2015-12-12 | Disposition: A | Discharge status: Home / Self Care | Payer: Managed Care, Other (non HMO) | Source: Ambulatory Visit | Attending: Adult Health | Admitting: Adult Health

## 2015-12-12 ENCOUNTER — Encounter: Payer: Self-pay | Admitting: Adult Health

## 2015-12-12 ENCOUNTER — Ambulatory Visit (INDEPENDENT_AMBULATORY_CARE_PROVIDER_SITE_OTHER): Payer: Managed Care, Other (non HMO) | Admitting: Adult Health

## 2015-12-12 VITALS — BP 120/88 | HR 99 | Temp 98.4°F | Ht 71.0 in | Wt 212.0 lb

## 2015-12-12 DIAGNOSIS — R05 Cough: Secondary | ICD-10-CM

## 2015-12-12 DIAGNOSIS — I10 Essential (primary) hypertension: Secondary | ICD-10-CM

## 2015-12-12 DIAGNOSIS — J4531 Mild persistent asthma with (acute) exacerbation: Secondary | ICD-10-CM

## 2015-12-12 DIAGNOSIS — R059 Cough, unspecified: Secondary | ICD-10-CM

## 2015-12-12 LAB — POCT INFLUENZA A/B
Influenza A, POC: NEGATIVE
Influenza B, POC: NEGATIVE

## 2015-12-12 MED ORDER — AZITHROMYCIN 250 MG PO TABS: ORAL_TABLET | ORAL | Status: AC

## 2015-12-12 MED ORDER — VALSARTAN 160 MG PO TABS: 160.0000 mg | ORAL_TABLET | Freq: Every day | ORAL | Status: DC

## 2015-12-12 NOTE — Patient Instructions (Signed)
Zpack take as directed.  Begin Probiotic daily  Stop Lisinopril .  Begin Diovan 160mg  daily in lisinopril place.  Follow up with Primary MD for ongoing blood pressure management .  Mucinex DM Twice daily  As needed  Cough/congestion .  Fluids and rest  Please contact office for sooner follow up if symptoms do not improve or worsen or seek emergency care  follow up Dr. Lake Bells in 6-8 weeks and As needed

## 2015-12-12 NOTE — Progress Notes (Signed)
Subjective:    Patient ID: Cody Day, male    DOB: 1956-07-05, 60 y.o.   MRN: [id number]  HPI 60 yo male former smoker  Hx of polysubstance abuse with multiple substances including meth  TEST  Spirometry nml 2014   12/12/2015 Acute OV  Pt presents for an acute office visit.  He continues to have persistent symptoms with cough, wheezing .  Complains of prod cough with white mucus, chest congestion/tightness, sinus pressure/drainage, increased SOB, wheezing starting 12/05/15. Denies any fever, chills, nauesa or vomiting.  Completed pred taper on 12/10/15. Initially seen at urgent care 1/30 , given Levaquin.  Then seen in office with Dr. Lake Bells on 2/7 given prednisone taper.  Says he gets a little better but then symptoms start to return.  Can not get cough to go away.      Past Medical History  Diagnosis Date  . Hypertension   . Asthma   . Gout   . Syncope 10/11    in settin gof asthma exacerbation w coughing. Ech (10/11): EF > 55%, mild LVH, grade I diastolic dysfunction, nomral RV size and systolic function,. normal valves. Carotid US (10/11): minimal disease  . Achilles tendon rupture 10/11    and repair  . Sleep apnea   . Hemorrhoids   . Hypercholesteremia   . Adenomatous polyp of colon 04/06/2010    Adenomatous polyps 3 in the descending colon, times one in the ascending colon [facility])   Current Outpatient Prescriptions on File Prior to Visit  Medication Sig Dispense Refill  . albuterol (PROVENTIL HFA;VENTOLIN HFA) 108 (90 BASE) MCG/ACT inhaler Inhale 2 puffs into the lungs every 6 (six) hours as needed for wheezing or shortness of breath. 1 Inhaler 3  . allopurinol (ZYLOPRIM) 100 MG tablet TAKE ONE (1) TABLET EACH DAY 30 tablet 11  . B Complex-C (SUPER B COMPLEX PO) Take 1 tablet by mouth daily.    . beclomethasone (QVAR) 80 MCG/ACT inhaler Inhale 2 puffs into the lungs 2 (two) times daily. 1 Inhaler 6  . Cinnamon 500 MG capsule Take 1,000 mg by mouth daily.      . Garlic 123XX123 MG TABS Take 1 tablet by mouth daily.    Marland Kitchen lisinopril (PRINIVIL,ZESTRIL) 20 MG tablet TAKE ONE (1) TABLET EACH DAY 30 tablet 11  . montelukast (SINGULAIR) 10 MG tablet Take 1 tablet (10 mg total) by mouth daily. 30 tablet 2  . Multiple Vitamins-Minerals (MULTIVITAL) tablet Take 1 tablet by mouth daily.      . primidone (MYSOLINE) 50 MG tablet Take 1 tablet (50 mg total) by mouth 2 (two) times daily. 60 tablet 3  . simvastatin (ZOCOR) 20 MG tablet TAKE ONE TABLET AT BEDTIME 30 tablet 11  . Spacer/Aero-Holding Chambers (AEROCHAMBER MV) inhaler Use as instructed 1 each 0  . SYMBICORT 160-4.5 MCG/ACT inhaler USE 2 PUFFS TWICE DAILY 10.2 g 11  . testosterone cypionate (DEPOTESTOTERONE CYPIONATE) 100 MG/ML injection Inject 100 mg into the muscle every 7 (seven) days. On Fridays only uses 1/2 ml  For IM use only    . tiZANidine (ZANAFLEX) 4 MG capsule Take 2 mg by mouth as needed for muscle spasms.     No current facility-administered medications on file prior to visit.     Review of Systems Constitutional:   No  weight loss, night sweats,  Fevers, chills,  +fatigue, or  lassitude.  HEENT:   No headaches,  Difficulty swallowing,  Tooth/dental problems, or  Sore throat,  No sneezing, itching, ear ache, nasal congestion, post nasal drip,   CV:  No chest pain,  Orthopnea, PND, swelling in lower extremities, anasarca, dizziness, palpitations, syncope.   GI  No heartburn, indigestion, abdominal pain, nausea, vomiting, diarrhea, change in bowel habits, loss of appetite, bloody stools.   Resp:.  No chest wall deformity  Skin: no rash or lesions.  GU: no dysuria, change in color of urine, no urgency or frequency.  No flank pain, no hematuria   MS:  No joint pain or swelling.  No decreased range of motion.  No back pain.  Psych:  No change in mood or affect. No depression or anxiety.  No memory loss.          Objective:   Physical Exam Filed Vitals:    12/12/15 1055  BP: 120/88  Pulse: 99  Temp: 98.4 F (36.9 C)  TempSrc: Oral  Height: 5\' 11"  (1.803 m)  Weight: 212 lb (96.163 kg)  SpO2: 97%   GEN: A/Ox3; pleasant , NAD,obese   HEENT:  [place]/AT,  EACs-clear, TMs-wnl, NOSE-clear, THROAT-clear, no lesions, no postnasal drip or exudate noted.   NECK:  Supple w/ fair ROM; no JVD; normal carotid impulses w/o bruits; no thyromegaly or nodules palpated; no lymphadenopathy.  RESP  Few trace rhonchi .no accessory muscle use, no dullness to percussion  CARD:  RRR, no m/r/g  , no peripheral edema, pulses intact, no cyanosis or clubbing.  GI:   Soft & nt; nml bowel sounds; no organomegaly or masses detected.  Musco: Warm bil, no deformities or joint swelling noted.   Neuro: alert, no focal deficits noted.    Skin: Warm, no lesions or rashes  CXR  12/12/15  COPD-reactive airway disease likely with acute bronchitis. There is no alveolar pneumonia nor CHF.     Assessment & Plan:

## 2015-12-18 NOTE — Assessment & Plan Note (Signed)
Stop ace inhibitor as may be causing cough to be worse.  Plan  Stop Lisinopril .  Begin Diovan 160mg  daily in lisinopril place.  Follow up with Primary MD for ongoing blood pressure management .  Mucinex DM Twice daily  As needed  Cough/congestion .  Fluids and rest  Please contact office for sooner follow up if symptoms do not improve or worsen or seek emergency care  follow up Dr. Lake Bells in 6-8 weeks and As needed

## 2015-12-18 NOTE — Assessment & Plan Note (Signed)
Exacerbation with URI  ? ACE causing worsening   Plan  Zpack take as directed.  Begin Probiotic daily  Stop Lisinopril .  Begin Diovan 160mg  daily in lisinopril place.  Follow up with Primary MD for ongoing blood pressure management .  Mucinex DM Twice daily  As needed  Cough/congestion .  Fluids and rest  Please contact office for sooner follow up if symptoms do not improve or worsen or seek emergency care  follow up Dr. Lake Bells in 6-8 weeks and As needed

## 2015-12-29 ENCOUNTER — Other Ambulatory Visit: Payer: Self-pay

## 2015-12-29 MED ORDER — MONTELUKAST SODIUM 10 MG PO TABS: 10.0000 mg | ORAL_TABLET | Freq: Every day | ORAL | Status: DC

## 2016-01-08 ENCOUNTER — Telehealth: Payer: Self-pay | Admitting: Pulmonary Disease

## 2016-01-08 MED ORDER — PREDNISONE 10 MG PO TABS: ORAL_TABLET | ORAL | Status: DC

## 2016-01-08 MED ORDER — AZITHROMYCIN 250 MG PO TABS: ORAL_TABLET | ORAL | Status: DC

## 2016-01-08 NOTE — Telephone Encounter (Signed)
Prednisone taper: Take 40mg  po daily for 3 days, then take 30mg  po daily for 3 days, then take 20mg  po daily for two days, then take 10mg  po daily for 2 days  And   Zpack

## 2016-01-08 NOTE — Telephone Encounter (Signed)
Spoke with pt and advised of recommendations. No further questions or concerns.

## 2016-01-08 NOTE — Telephone Encounter (Signed)
Spoke with pt and he states that he has cough with yellow mucus, back pain between shoulder blades, sinus congestion with sore throat since Friday. Pt denies f/n/v. Pt states that his symptoms  are same as last URI treated with Zpak by TP 12/12/15. Pt states he has started Mucinex yesterday with little to no symptom relief. Pt would like another Zpak called in.   Allergies  Allergen Reactions  . Levaquin [Levofloxacin In D5w]     Severe headaches, skin tingling/redness  . Penicillins     REACTION: (Childhood - bumps, rash)   LOV: 12/12/15 w/TP POV: 02/07/13 w/BQ

## 2016-01-26 ENCOUNTER — Other Ambulatory Visit: Payer: Self-pay | Admitting: Family Medicine

## 2016-01-31 ENCOUNTER — Other Ambulatory Visit: Payer: Self-pay | Admitting: Family Medicine

## 2016-02-08 ENCOUNTER — Other Ambulatory Visit (INDEPENDENT_AMBULATORY_CARE_PROVIDER_SITE_OTHER): Payer: Managed Care, Other (non HMO)

## 2016-02-08 ENCOUNTER — Ambulatory Visit (INDEPENDENT_AMBULATORY_CARE_PROVIDER_SITE_OTHER): Payer: Managed Care, Other (non HMO) | Admitting: Pulmonary Disease

## 2016-02-08 ENCOUNTER — Encounter: Payer: Self-pay | Admitting: Pulmonary Disease

## 2016-02-08 VITALS — BP 144/86 | HR 104 | Ht 71.0 in | Wt 215.0 lb

## 2016-02-08 DIAGNOSIS — J4550 Severe persistent asthma, uncomplicated: Secondary | ICD-10-CM | POA: Diagnosis not present

## 2016-02-08 LAB — CBC WITH DIFFERENTIAL/PLATELET
Basophils Absolute: 0 10*3/uL (ref 0.0–0.1)
Basophils Relative: 0.6 % (ref 0.0–3.0)
Eosinophils Absolute: 0.1 10*3/uL (ref 0.0–0.7)
Eosinophils Relative: 1.3 % (ref 0.0–5.0)
HCT: 46.2 % (ref 39.0–52.0)
Hemoglobin: 15.1 g/dL (ref 13.0–17.0)
Lymphocytes Relative: 26.4 % (ref 12.0–46.0)
Lymphs Abs: 2.2 10*3/uL (ref 0.7–4.0)
MCHC: 32.6 g/dL (ref 30.0–36.0)
MCV: 78.7 fl (ref 78.0–100.0)
Monocytes Absolute: 1.1 10*3/uL — ABNORMAL HIGH (ref 0.1–1.0)
Monocytes Relative: 12.8 % — ABNORMAL HIGH (ref 3.0–12.0)
Neutro Abs: 4.9 10*3/uL (ref 1.4–7.7)
Neutrophils Relative %: 58.9 % (ref 43.0–77.0)
Platelets: 293 10*3/uL (ref 150.0–400.0)
RBC: 5.88 Mil/uL — ABNORMAL HIGH (ref 4.22–5.81)
RDW: 19.3 % — ABNORMAL HIGH (ref 11.5–15.5)
WBC: 8.3 10*3/uL (ref 4.0–10.5)

## 2016-02-08 MED ORDER — PREDNISONE 10 MG PO TABS: ORAL_TABLET | ORAL | Status: DC

## 2016-02-08 MED ORDER — ALBUTEROL SULFATE (2.5 MG/3ML) 0.083% IN NEBU: 2.5000 mg | INHALATION_SOLUTION | RESPIRATORY_TRACT | Status: DC | PRN

## 2016-02-08 NOTE — Assessment & Plan Note (Signed)
Cody Day has really struggled in 2017 with recurrent exacerbations of his asthma. It is not entirely clear to me that there is anything else going on right now in that we tested his heart last year and it was normal and he's not experiencing significant sinus or acid reflux symptoms to suggest that this is a vocal cord problem. He clearly is allergic to the wood dust in his working environment but the recent pollen has made his symptoms worse as well.  I explained to him that uncontrolled asthma is typically an atopic problem, but sometimes there are atypical infections or fungal airways issues that can cause recurrent exacerbations. I think he may need to be considered for biologic therapy considering the severity of his symptoms, but at this time I have no evidence of an elevated serum IgE or elevated serum eosinophils to chest of 5 this with his insurance company.  Plan: Check CBC with differential, serum IgE Long prednisone taper for 6 weeks Qvar in addition to Symbicort, multiple previous notes written for justification of this Albuterol neb to use at home as needed for shortness of breath, continue as needed HFA inhaler while out of the house Sputum sample for AFB, fungal, bacteria Follow-up 6 weeks or sooner if needed  Greater than 50% of this visit was spent face-to-face, 27 minute visit

## 2016-02-08 NOTE — Progress Notes (Signed)
Subjective:    Patient ID: Cody Day, male    DOB: 1956-03-02, 60 y.o.   MRN: [id number]  Synopsis: Cody Day first saw the [facility] pulmonary clinic in the spring of 2015 for shortness of breath, cough, mucus production. He had lung function testing in 2014 which was completely normal. He had a past history significant for long-term inhaled illicit drug use with multiple substances including methamphetamine.  Later PFT findings have shown airflow obstruction.   HPI Chief Complaint  Patient presents with  . Follow-up    pt c/o increased weakness, dizziness, prod cough with clear mucus, shortness of breath X4 months.     Since he saw Tammy he has struggled with ongoing dyspnea and he ended up getting another round of prednisone and a zpack. He is experiencing: Dizziness Chest tightness Dyspnea (says he "locks up" in his chest) Cool dry air helps  When he takes prednisone it helps after a few days.  Albuterol helps, he has been using this more frequently.  He still has symbicort but the QVar is too expensive.  He coughs up "chunks" of white mucus from time to time, sometimes it is clear.   He doesn't have chest pain, just tightness.  He has not experienced fever or chills, no weight loss.  He not having heartburn or indigestion or acid reflux.    The pollen has really made him worse.     Past Medical History  Diagnosis Date  . Hypertension   . Asthma   . Gout   . Syncope 10/11    in settin gof asthma exacerbation w coughing. Ech (10/11): EF > 55%, mild LVH, grade I diastolic dysfunction, nomral RV size and systolic function,. normal valves. Carotid US (10/11): minimal disease  . Achilles tendon rupture 10/11    and repair  . Sleep apnea   . Hemorrhoids   . Hypercholesteremia   . Adenomatous polyp of colon 04/06/2010    Adenomatous polyps 3 in the descending colon, times one in the ascending colon [facility])       Review of Systems    Constitutional: Positive for fatigue. Negative for fever and chills.  HENT: Negative for congestion, postnasal drip, rhinorrhea and sinus pressure.   Respiratory: Positive for cough, shortness of breath and wheezing.   Cardiovascular: Negative for chest pain and palpitations.  Gastrointestinal: Negative for nausea and vomiting.  Musculoskeletal: Negative for myalgias.   Objective:   Physical Exam Filed Vitals:   02/08/16 1521  BP: 144/86  Pulse: 104  Height: 5\' 11"  (1.803 m)  Weight: 215 lb (97.523 kg)  SpO2: 98%   RA  Gen: chronically ill appearing, no acute distress HEENT: NCAT, EOMi, OP clear, PULM: CTA B CV: RRR, no mgr, no JVD AB: BS+, soft, nontender, no hsm Ext: warm, no edema, no clubbing, no cyanosis   CBC    Component Value Date/Time   WBC 4.9 07/14/2015 0737   WBC 6.8 10/23/2012 1346   RBC 6.17* 07/14/2015 0737   RBC 5.75 10/23/2012 1346   HGB 15.1 07/14/2015 0737   HGB 13.5 10/23/2012 1346   HCT 47.0 07/14/2015 0737   HCT 41.2 10/23/2012 1346   PLT 270.0 07/14/2015 0737   PLT 257 10/23/2012 1346   MCV 76.2* 07/14/2015 0737   MCV 72* 10/23/2012 1346   MCH 23.4* 10/23/2012 1346   MCH 21.8* 11/12/2011 1217   MCHC 32.1 07/14/2015 0737   MCHC 32.7 10/23/2012 1346   RDW 17.0* 07/14/2015 [id number]  RDW 17.2* 10/23/2012 1346   LYMPHSABS 1.4 07/14/2015 0737   MONOABS 0.6 07/14/2015 0737   EOSABS 0.1 07/14/2015 0737   BASOSABS 0.0 07/14/2015 0737    Records from the last visit with my nurse practitioner reviewed were was treated for bronchitis flare with prednisone and an antibiotic    Assessment & Plan:   Asthma, chronic Cody Day has really struggled in 2017 with recurrent exacerbations of his asthma. It is not entirely clear to me that there is anything else going on right now in that we tested his heart last year and it was normal and he's not experiencing significant sinus or acid reflux symptoms to suggest that this is a vocal cord problem. He clearly is  allergic to the wood dust in his working environment but the recent pollen has made his symptoms worse as well.  I explained to him that uncontrolled asthma is typically an atopic problem, but sometimes there are atypical infections or fungal airways issues that can cause recurrent exacerbations. I think he may need to be considered for biologic therapy considering the severity of his symptoms, but at this time I have no evidence of an elevated serum IgE or elevated serum eosinophils to chest of 5 this with his insurance company.  Plan: Check CBC with differential, serum IgE Long prednisone taper for 6 weeks Qvar in addition to Symbicort, multiple previous notes written for justification of this Albuterol neb to use at home as needed for shortness of breath, continue as needed HFA inhaler while out of the house Sputum sample for AFB, fungal, bacteria Follow-up 6 weeks or sooner if needed  Greater than 50% of this visit was spent face-to-face, 27 minute visit     02/08/2016 3:58 PM  Updated Medication List Outpatient Encounter Prescriptions as of 02/08/2016  Medication Sig  . albuterol (PROVENTIL HFA;VENTOLIN HFA) 108 (90 BASE) MCG/ACT inhaler Inhale 2 puffs into the lungs every 6 (six) hours as needed for wheezing or shortness of breath.  . allopurinol (ZYLOPRIM) 100 MG tablet TAKE ONE (1) TABLET EACH DAY  . B Complex-C (SUPER B COMPLEX PO) Take 1 tablet by mouth daily.  Marland Kitchen lisinopril (PRINIVIL,ZESTRIL) 20 MG tablet Take 20 mg by mouth daily.  . montelukast (SINGULAIR) 10 MG tablet Take 1 tablet (10 mg total) by mouth daily.  . Multiple Vitamins-Minerals (MULTIVITAL) tablet Take 1 tablet by mouth daily.    . predniSONE (DELTASONE) 10 MG tablet Take prednisone 20mg  daily for 2 weeks, then 10mg  daily for 2 weeks, then 5mg  daily for 2 weeks then stop  . primidone (MYSOLINE) 50 MG tablet TAKE ONE TABLET TWICE DAILY  . simvastatin (ZOCOR) 20 MG tablet TAKE ONE (1) TABLET AT BEDTIME  .  Spacer/Aero-Holding Chambers (AEROCHAMBER MV) inhaler Use as instructed  . SYMBICORT 160-4.5 MCG/ACT inhaler USE 2 PUFFS TWICE DAILY  . testosterone cypionate (DEPOTESTOTERONE CYPIONATE) 100 MG/ML injection Inject 100 mg into the muscle every 7 (seven) days. On Fridays only uses 1/2 ml  For IM use only  . tiZANidine (ZANAFLEX) 4 MG capsule Take 2 mg by mouth as needed for muscle spasms.  . [DISCONTINUED] predniSONE (DELTASONE) 10 MG tablet Take 40mg  daily x3 days, Take 30mg  daily x3 days, Take 20mg  daily x2 days, Take 10mg  daily x2 days. STOP.  Marland Kitchen beclomethasone (QVAR) 80 MCG/ACT inhaler Inhale 2 puffs into the lungs 2 (two) times daily. (Patient not taking: Reported on 02/08/2016)  . [DISCONTINUED] azithromycin (ZITHROMAX) 250 MG tablet TAKE AS DIRECTED (Patient not taking: Reported on  02/08/2016)  . [DISCONTINUED] Cinnamon 500 MG capsule Take 1,000 mg by mouth daily. Reported on 02/08/2016  . [DISCONTINUED] Garlic 123XX123 MG TABS Take 1 tablet by mouth daily. Reported on 02/08/2016  . [DISCONTINUED] valsartan (DIOVAN) 160 MG tablet Take 1 tablet (160 mg total) by mouth daily. (Patient not taking: Reported on 02/08/2016)   No facility-administered encounter medications on file as of 02/08/2016.

## 2016-02-08 NOTE — Patient Instructions (Signed)
Take prednisone for 6 weeks as prescribed Provide [place] with a sample of your mucus Take the Qvar 2 puffs twice a day in addition to the Symbicort Use the albuterol nebulizer as needed for shortness of breath We will see you back in 6 weeks, you may need to see our nurse practitioner first

## 2016-02-09 LAB — IGE: IgE (Immunoglobulin E), Serum: 9 kU/L (ref <115)

## 2016-02-23 ENCOUNTER — Ambulatory Visit (INDEPENDENT_AMBULATORY_CARE_PROVIDER_SITE_OTHER): Payer: Managed Care, Other (non HMO) | Admitting: Family Medicine

## 2016-02-23 ENCOUNTER — Encounter: Payer: Self-pay | Admitting: Family Medicine

## 2016-02-23 VITALS — BP 124/76 | HR 86 | Temp 98.7°F | Ht 71.0 in | Wt 215.5 lb

## 2016-02-23 DIAGNOSIS — M1A9XX0 Chronic gout, unspecified, without tophus (tophi): Secondary | ICD-10-CM

## 2016-02-23 DIAGNOSIS — F411 Generalized anxiety disorder: Secondary | ICD-10-CM | POA: Diagnosis not present

## 2016-02-23 DIAGNOSIS — R7303 Prediabetes: Secondary | ICD-10-CM

## 2016-02-23 DIAGNOSIS — G250 Essential tremor: Secondary | ICD-10-CM | POA: Diagnosis not present

## 2016-02-23 DIAGNOSIS — J4531 Mild persistent asthma with (acute) exacerbation: Secondary | ICD-10-CM

## 2016-02-23 DIAGNOSIS — I10 Essential (primary) hypertension: Secondary | ICD-10-CM

## 2016-02-23 NOTE — Assessment & Plan Note (Signed)
Well controlled. Continue current medication.  

## 2016-02-23 NOTE — Assessment & Plan Note (Signed)
Stable control on no medication. Mainly secondary to SOB from asthma.

## 2016-02-23 NOTE — Assessment & Plan Note (Signed)
Severe, currently with exacerbation, improving on 6 week course of prednisone. His poor breathing limits him at home and work greatly; any minimal exertion makes him SOB. He has to it down at work frequently. Cannot talk for a long time or do any lifting.

## 2016-02-23 NOTE — Patient Instructions (Addendum)
Work on low carb and low chol diet. Exercise as able. Call if mood control worsening.

## 2016-02-23 NOTE — Assessment & Plan Note (Signed)
On primidone, improved but still limits him from writing and other job functions. Albuterol for asthma make it worse as well.,

## 2016-02-23 NOTE — Progress Notes (Signed)
Pre visit review using our clinic review tool, if applicable. No additional management support is needed unless otherwise documented below in the visit note. 

## 2016-02-23 NOTE — Assessment & Plan Note (Signed)
Will re-eval along with cholesterol at [facility], but pt instructed to  especiallywatch sugar intake as on 6 week course of prednisone.

## 2016-02-23 NOTE — Assessment & Plan Note (Signed)
Stable control on allopurinol , no flares.

## 2016-02-23 NOTE — Progress Notes (Signed)
60 year old male presents for follow up 6 months for multiple issues.  Anxiety, good control:  On no medication.  Anxious when he is having shortness of breath. Writing.  Benign essential tremor: Improved on primidone 50 mg BID. Tremor still limits his writing and self feeding.  Hypertension:Good control on lisinopril/HCTZ . BP Readings from Last 3 Encounters:  02/23/16 124/76  02/08/16 144/86  12/12/15 120/88  Using medication without problems or lightheadedness: None  Chest pain with exertion:None  Edema:None  Short of breath: poor Average home BPs:not checking.  Other issues:   COPD/asthma: Followed by pulm, Dr. Lake Bells. On Qvat and singuliar. Using proventil for rescue off and on. Currently using prednisone low dose for 6 week. Has history of 10 years of smoking meth amphetamine. No current drug of tobacco use.  Trying to get AFB culture. Looking into disability.  Breathing poor control in last year has limited his ability to work. Any exertion causes severe shortness of breath. Unable to walk further than 100 feet without shortness of breath. Unable to do heavy lifting, strenuous activity.  Unable to do housework and yard work at home.  Allergies, moderate control: On  singulair  Gout: No recent flares on allopurinol.  No SE.  Review of Systems  Constitutional: Negative for fever, fatigue and unexpected weight change.  HENT: Negative for ear pain, congestion, sore throat, rhinorrhea, trouble swallowing and postnasal drip.  Eyes: Negative for pain.  Respiratory: Negative for cough, shortness of breath and wheezing.  Cardiovascular: Negative for chest pain, palpitations and leg swelling.  Gastrointestinal: Negative for nausea, abdominal pain, diarrhea, constipation and blood in stool.  Genitourinary: Negative for dysuria, urgency, hematuria, discharge, penile swelling, scrotal swelling, difficulty urinating, penile pain and testicular pain.  Skin: Negative  for rash.  Neurological: Negative for syncope, weakness, light-headedness, numbness and headaches.  Psychiatric/Behavioral: Negative for behavioral problems and dysphoric mood. The patient is not nervous/anxious.  Objective:   Physical Exam  Constitutional: He appears well-developed and well-nourished. Non-toxic appearance. He does not appear ill. No distress.  HENT:  Head: Normocephalic and atraumatic.  Right Ear: Hearing, tympanic membrane, external ear and ear canal normal.  Left Ear: Hearing, tympanic membrane, external ear and ear canal normal.  Nose: Nose normal.  Mouth/Throat: Uvula is midline, oropharynx is clear and moist and mucous membranes are normal.  Eyes: Conjunctivae, EOM and lids are normal. Pupils are equal, round, and reactive to light. No foreign bodies found.  Neck: Trachea normal, normal range of motion and phonation normal. Neck supple. Carotid bruit is not present. No mass and no thyromegaly present.  Cardiovascular: Normal rate, regular rhythm, S1 normal, S2 normal, intact distal pulses and normal pulses. Exam reveals no gallop.  No murmur heard.  Pulmonary/Chest: Breath sounds normal. He has no wheezes. He has no rhonchi. He has no rales.  Abdominal: Soft. Normal appearance and bowel sounds are normal. There is no hepatosplenomegaly. There is no tenderness. There is no rebound, no guarding and no CVA tenderness. No hernia.  Lymphadenopathy:  He has no cervical adenopathy.  Neurological: He is alert. He has normal strength and normal reflexes. No cranial nerve deficit or sensory deficit. Gait normal.  Skin: Skin is warm, dry and intact. No rash noted.  Psychiatric: He has a normal mood and affect. His speech is normal and behavior is normal. Judgment and thought content normal. Cognition and memory are normal.

## 2016-03-01 ENCOUNTER — Other Ambulatory Visit: Payer: Self-pay | Admitting: Family Medicine

## 2016-03-21 ENCOUNTER — Ambulatory Visit (INDEPENDENT_AMBULATORY_CARE_PROVIDER_SITE_OTHER): Payer: Managed Care, Other (non HMO) | Admitting: Pulmonary Disease

## 2016-03-21 ENCOUNTER — Encounter: Payer: Self-pay | Admitting: Pulmonary Disease

## 2016-03-21 VITALS — BP 138/86 | HR 96 | Ht 71.0 in | Wt 211.0 lb

## 2016-03-21 DIAGNOSIS — J4531 Mild persistent asthma with (acute) exacerbation: Secondary | ICD-10-CM

## 2016-03-21 DIAGNOSIS — R0600 Dyspnea, unspecified: Secondary | ICD-10-CM | POA: Diagnosis not present

## 2016-03-21 MED ORDER — PREDNISONE 10 MG PO TABS: ORAL_TABLET | ORAL | Status: DC

## 2016-03-21 NOTE — Assessment & Plan Note (Signed)
Cody Day has severe asthma symptoms that are severely limiting his ability to work. His serum eosinophils are elevated, lung function testing a year ago show significant airflow obstruction which improved with a bronchodilator.  He has severe persistent asthma which is only minimally controlled with prednisone use.  Given his frequent exacerbations and severity of symptoms and elevated serum eosinophils, I believe that he needs to be treated with an IL-5 inhibitor (Nucala).  Plan: Start Nucala Spirometry today FeNO today He was educated on the importance of using Symbicort regularly Continue Qvar Continue albuterol as needed Continue Singulair

## 2016-03-21 NOTE — Assessment & Plan Note (Signed)
If no improvement with Nucala and prednisone, will need to consider repeat CT chest and pulmonary function testing but considering the fact that all we found last year with those tests was asthma I don't think it's necessary right now.

## 2016-03-21 NOTE — Progress Notes (Signed)
Subjective:    Patient ID: Cody Day, male    DOB: 1956-09-14, 60 y.o.   MRN: [id number]  Synopsis: Cody Day first saw the [facility] pulmonary clinic in the spring of 2015 for shortness of breath, cough, mucus production. He had lung function testing in 2014 which was completely normal. He had a past history significant for long-term inhaled illicit drug use with multiple substances including methamphetamine.  Later PFT findings have shown airflow obstruction.   HPI Chief Complaint  Patient presents with  . Follow-up    pt c/o worsening chest tightness, sob with any exertion.  Pt states this has been worsening since January.       Cody Day is having a hard with his breathing.  When he was on the prednisone it didn't really help with his breathing.  He has been having bouts of severe dyspnea with exertion of any He is not coughing up mucus. Alubterol helps when he takes it, symptoms are relieved temporarily. Working is really hard. Even when he is at home he has a hard time. He is not taking the symbicort reguarly.  He is using his albuterol frequently He is taking the Qvar regularly.   Past Medical History  Diagnosis Date  . Hypertension   . Asthma   . Gout   . Syncope 10/11    in settin gof asthma exacerbation w coughing. Ech (10/11): EF > 55%, mild LVH, grade I diastolic dysfunction, nomral RV size and systolic function,. normal valves. Carotid US (10/11): minimal disease  . Achilles tendon rupture 10/11    and repair  . Sleep apnea   . Hemorrhoids   . Hypercholesteremia   . Adenomatous polyp of colon 04/06/2010    Adenomatous polyps 3 in the descending colon, times one in the ascending colon [facility])       Review of Systems  Constitutional: Positive for fatigue. Negative for fever and chills.  HENT: Negative for congestion, postnasal drip, rhinorrhea and sinus pressure.   Respiratory: Positive for cough, shortness of breath and wheezing.     Cardiovascular: Negative for chest pain and palpitations.  Gastrointestinal: Negative for nausea and vomiting.  Musculoskeletal: Negative for myalgias.   Objective:   Physical Exam Filed Vitals:   03/21/16 1527  BP: 138/86  Pulse: 96  Height: 5\' 11"  (1.803 m)  Weight: 211 lb (95.709 kg)  SpO2: 97%   RA  Gen: chronically ill appearing, no acute distress HEENT: NCAT, EOMi, OP clear, PULM: No wheezing, increased work of breathing CV: RRR, no mgr, no JVD AB: BS+, soft, nontender, no hsm Ext: warm, no edema, no clubbing, no cyanosis   CBC    Component Value Date/Time   WBC 8.3 02/08/2016 1619   WBC 6.8 10/23/2012 1346   RBC 5.88* 02/08/2016 1619   RBC 5.75 10/23/2012 1346   HGB 15.1 02/08/2016 1619   HGB 13.5 10/23/2012 1346   HCT 46.2 02/08/2016 1619   HCT 41.2 10/23/2012 1346   PLT 293.0 02/08/2016 1619   PLT 257 10/23/2012 1346   MCV 78.7 02/08/2016 1619   MCV 72* 10/23/2012 1346   MCH 23.4* 10/23/2012 1346   MCH 21.8* 11/12/2011 1217   MCHC 32.6 02/08/2016 1619   MCHC 32.7 10/23/2012 1346   RDW 19.3* 02/08/2016 1619   RDW 17.2* 10/23/2012 1346   LYMPHSABS 2.2 02/08/2016 1619   MONOABS 1.1* 02/08/2016 1619   EOSABS 0.1 02/08/2016 1619   BASOSABS 0.0 02/08/2016 1619    Chest x-ray from  February reviewed with areas hyperinflation but no infiltrate Chest CT from one year ago reviewed showing no significant pulmonary parenchymal abnormality in excellent primary fashion testing from a year ago reviewed where he had moderate airflow obstruction (FEV1 77% predicted with a ratio of 66) which improved significantly with albuterol to no airflow obstruction. Simple spirometry 03/21/2016 no significant airflow obstruction, FEV1 2.47 L (64% predicted).  Assessment & Plan:   Asthma, chronic Deatrick has severe asthma symptoms that are severely limiting his ability to work. His serum eosinophils are elevated, lung function testing a year ago show significant airflow  obstruction which improved with a bronchodilator.  He has severe persistent asthma which is only minimally controlled with prednisone use.  Given his frequent exacerbations and severity of symptoms and elevated serum eosinophils, I believe that he needs to be treated with an IL-5 inhibitor (Nucala).  Plan: Start Nucala Spirometry today FeNO today He was educated on the importance of using Symbicort regularly Continue Qvar Continue albuterol as needed Continue Singulair    Dyspnea If no improvement with Nucala and prednisone, will need to consider repeat CT chest and pulmonary function testing but considering the fact that all we found last year with those tests was asthma I don't think it's necessary right now.     03/21/2016 4:03 PM  Updated Medication List Outpatient Encounter Prescriptions as of 03/21/2016  Medication Sig  . albuterol (PROVENTIL HFA;VENTOLIN HFA) 108 (90 BASE) MCG/ACT inhaler Inhale 2 puffs into the lungs every 6 (six) hours as needed for wheezing or shortness of breath.  Marland Kitchen albuterol (PROVENTIL) (2.5 MG/3ML) 0.083% nebulizer solution Take 3 mLs (2.5 mg total) by nebulization every 4 (four) hours as needed for wheezing or shortness of breath.  . allopurinol (ZYLOPRIM) 100 MG tablet TAKE ONE (1) TABLET EACH DAY  . B Complex-C (SUPER B COMPLEX PO) Take 1 tablet by mouth daily.  . beclomethasone (QVAR) 80 MCG/ACT inhaler Inhale 2 puffs into the lungs 2 (two) times daily.  Marland Kitchen lisinopril (PRINIVIL,ZESTRIL) 20 MG tablet TAKE ONE (1) TABLET EACH DAY  . montelukast (SINGULAIR) 10 MG tablet Take 1 tablet (10 mg total) by mouth daily.  . Multiple Vitamins-Minerals (MULTIVITAL) tablet Take 1 tablet by mouth daily.    . primidone (MYSOLINE) 50 MG tablet TAKE ONE TABLET TWICE DAILY  . simvastatin (ZOCOR) 20 MG tablet TAKE ONE (1) TABLET AT BEDTIME  . testosterone cypionate (DEPOTESTOTERONE CYPIONATE) 100 MG/ML injection Inject 100 mg into the muscle every 7 (seven) days. On  Fridays only uses 1/2 ml  For IM use only  . tiZANidine (ZANAFLEX) 4 MG capsule Take 2 mg by mouth as needed for muscle spasms.  . predniSONE (DELTASONE) 10 MG tablet Take 60mg  daily for 7 days, then take 50mg  daily for 7 days, then take 40mg  daily for 7 days, then take 30mg  daily for 7 days, then take 20mg  daily for 7 days, then take 10mg  daily for 7 days  . [DISCONTINUED] predniSONE (DELTASONE) 10 MG tablet Reported on 03/21/2016   No facility-administered encounter medications on file as of 03/21/2016.

## 2016-03-21 NOTE — Patient Instructions (Addendum)
We are going to prescribe a new injectable medicine for your asthma called Nucala Take Symbicort 2 puffs twice a day, no matter how you feel Take Qvar 2 puffs twice a day no matter how you feel Take the prednisone for 6 weeks as prescribed We will see you back in 4 weeks or sooner if needed

## 2016-04-02 ENCOUNTER — Other Ambulatory Visit: Payer: Self-pay

## 2016-04-02 MED ORDER — MONTELUKAST SODIUM 10 MG PO TABS: 10.0000 mg | ORAL_TABLET | Freq: Every day | ORAL | Status: DC

## 2016-04-12 ENCOUNTER — Other Ambulatory Visit: Payer: Self-pay | Admitting: Family Medicine

## 2016-04-25 ENCOUNTER — Other Ambulatory Visit: Payer: Self-pay | Admitting: Family Medicine

## 2016-04-25 NOTE — Telephone Encounter (Signed)
Last office visit 02/23/2016.  Symbicort not on current medication list.  Ok to refill?

## 2016-04-26 ENCOUNTER — Ambulatory Visit (INDEPENDENT_AMBULATORY_CARE_PROVIDER_SITE_OTHER): Payer: Managed Care, Other (non HMO) | Admitting: Pulmonary Disease

## 2016-04-26 ENCOUNTER — Other Ambulatory Visit: Payer: Self-pay

## 2016-04-26 ENCOUNTER — Encounter: Payer: Self-pay | Admitting: Pulmonary Disease

## 2016-04-26 VITALS — BP 142/74 | HR 90 | Ht 71.0 in | Wt 215.0 lb

## 2016-04-26 DIAGNOSIS — J4531 Mild persistent asthma with (acute) exacerbation: Secondary | ICD-10-CM

## 2016-04-26 MED ORDER — BECLOMETHASONE DIPROPIONATE 80 MCG/ACT IN AERS: 2.0000 | INHALATION_SPRAY | Freq: Two times a day (BID) | RESPIRATORY_TRACT | Status: DC

## 2016-04-26 MED ORDER — ALBUTEROL SULFATE HFA 108 (90 BASE) MCG/ACT IN AERS: 2.0000 | INHALATION_SPRAY | Freq: Four times a day (QID) | RESPIRATORY_TRACT | Status: DC | PRN

## 2016-04-26 NOTE — Addendum Note (Signed)
Addended by: Inge Rise on: 04/26/2016 03:57 PM   Modules accepted: Orders

## 2016-04-26 NOTE — Addendum Note (Signed)
Addended by: Inge Rise on: 04/26/2016 04:02 PM   Modules accepted: Orders

## 2016-04-26 NOTE — Patient Instructions (Addendum)
Keep taking the Symbicort and Qvar as you're doing We will refer you to the [facility] for a second opinion Take a prilosec daily We'll see you back in 3 months or sooner if needed

## 2016-04-26 NOTE — Progress Notes (Signed)
Subjective:    Patient ID: Cody Day, male    DOB: 1956/05/14, 60 y.o.   MRN: [id number]  Synopsis: Cody Day first saw the [facility] pulmonary clinic in the spring of 2015 for shortness of breath, cough, mucus production. He had lung function testing in 2014 which was completely normal. He had a past history significant for long-term inhaled illicit drug use with multiple substances including methamphetamine.  Later PFT findings have shown airflow obstruction.   HPI Chief Complaint  Patient presents with  . Follow-up    Pt has been out of work X2 days d/t breathing- sob with any exertion, frequently clearing throat.     [name] continues to struggle with breathing again. He continues to have breathing just at rest and continues to have the sensation of asthma symptoms again.  Again, his dyspnea is worse at work.  The rescue inhaler is being used 3-4 times a day.  He has been on prednisone Again since he saw in the last time. This helped a little bit. He doesn't have any wheezing. The albuterol helps briefly when he takes it. He continues to take Symbicort and Qvar.  Past Medical History  Diagnosis Date  . Hypertension   . Asthma   . Gout   . Syncope 10/11    in settin gof asthma exacerbation w coughing. Ech (10/11): EF > 55%, mild LVH, grade I diastolic dysfunction, nomral RV size and systolic function,. normal valves. Carotid US (10/11): minimal disease  . Achilles tendon rupture 10/11    and repair  . Sleep apnea   . Hemorrhoids   . Hypercholesteremia   . Adenomatous polyp of colon 04/06/2010    Adenomatous polyps 3 in the descending colon, times one in the ascending colon [facility])       Review of Systems  Constitutional: Positive for fatigue. Negative for fever and chills.  HENT: Negative for congestion, postnasal drip, rhinorrhea and sinus pressure.   Respiratory: Positive for cough, shortness of breath and wheezing.   Cardiovascular: Negative for  chest pain and palpitations.  Gastrointestinal: Negative for nausea and vomiting.  Musculoskeletal: Negative for myalgias.   Objective:   Physical Exam Filed Vitals:   04/26/16 1516  BP: 142/74  Pulse: 90  Height: 5\' 11"  (1.803 m)  Weight: 215 lb (97.523 kg)  SpO2: 97%   RA  Gen: chronically ill appearing, no acute distress HEENT: NCAT, EOMi, OP clear, PULM: No wheezing, increased work of breathing CV: RRR, no mgr, no JVD AB: BS+, soft, nontender, no hsm Ext: warm, no edema, no clubbing, no cyanosis   CBC    Component Value Date/Time   WBC 8.3 02/08/2016 1619   WBC 6.8 10/23/2012 1346   RBC 5.88* 02/08/2016 1619   RBC 5.75 10/23/2012 1346   HGB 15.1 02/08/2016 1619   HGB 13.5 10/23/2012 1346   HCT 46.2 02/08/2016 1619   HCT 41.2 10/23/2012 1346   PLT 293.0 02/08/2016 1619   PLT 257 10/23/2012 1346   MCV 78.7 02/08/2016 1619   MCV 72* 10/23/2012 1346   MCH 23.4* 10/23/2012 1346   MCH 21.8* 11/12/2011 1217   MCHC 32.6 02/08/2016 1619   MCHC 32.7 10/23/2012 1346   RDW 19.3* 02/08/2016 1619   RDW 17.2* 10/23/2012 1346   LYMPHSABS 2.2 02/08/2016 1619   MONOABS 1.1* 02/08/2016 1619   EOSABS 0.1 02/08/2016 1619   BASOSABS 0.0 02/08/2016 1619    Chest x-ray from February reviewed with areas hyperinflation but no infiltrate  Chest CT from one year ago reviewed showing no significant pulmonary parenchymal abnormality Pulmonary function testing from a year ago reviewed where he had moderate airflow obstruction (FEV1 77% predicted with a ratio of 66) which improved significantly with albuterol to no airflow obstruction. Simple spirometry 03/21/2016 no significant airflow obstruction, FEV1 2.47 L (64% predicted).  Assessment & Plan:   Asthma, chronic I continue to struggle with Arnell Sieving. He complains of dyspnea which has been worsening this year which seems to improve with prednisone. However, he has not had wheezing on exam and on repeated lung function testing he's had  evidence of airflow reversibility, but it's never been severe.  Further, with combination therapy (Symbicort and Qvar) he initially had some improvement but even this has not been long lasting.  I still think this is likely an poorly controlled asthma but in the absence of elevated serum eosinophils or elevated serum IgE struggling with the next step to help control his disease other than telling him to quit work altogether because of his work environment.  Plan: Refer to [facility] for second opinion, asthma and allergy Center Continue Symbicort and Qvar Consider repeat PFT and CT scan if [facility] referral cannot be performed.     04/26/2016 3:52 PM  Updated Medication List Outpatient Encounter Prescriptions as of 04/26/2016  Medication Sig  . albuterol (PROVENTIL HFA;VENTOLIN HFA) 108 (90 Base) MCG/ACT inhaler Inhale 2 puffs into the lungs every 6 (six) hours as needed for wheezing or shortness of breath.  Marland Kitchen albuterol (PROVENTIL) (2.5 MG/3ML) 0.083% nebulizer solution Take 3 mLs (2.5 mg total) by nebulization every 4 (four) hours as needed for wheezing or shortness of breath.  . allopurinol (ZYLOPRIM) 100 MG tablet TAKE ONE (1) TABLET EACH DAY  . B Complex-C (SUPER B COMPLEX PO) Take 1 tablet by mouth daily.  . beclomethasone (QVAR) 80 MCG/ACT inhaler Inhale 2 puffs into the lungs 2 (two) times daily.  . Cinnamon 500 MG capsule Take 500 mg by mouth daily.  Marland Kitchen lisinopril (PRINIVIL,ZESTRIL) 20 MG tablet TAKE ONE (1) TABLET EACH DAY  . montelukast (SINGULAIR) 10 MG tablet Take 1 tablet (10 mg total) by mouth daily.  . Multiple Vitamins-Minerals (MULTIVITAL) tablet Take 1 tablet by mouth daily.    . predniSONE (DELTASONE) 10 MG tablet Take 60mg  daily for 7 days, then take 50mg  daily for 7 days, then take 40mg  daily for 7 days, then take 30mg  daily for 7 days, then take 20mg  daily for 7 days, then take 10mg  daily for 7 days  . primidone (MYSOLINE) 50 MG tablet TAKE ONE TABLET TWICE DAILY  . simvastatin  (ZOCOR) 20 MG tablet TAKE ONE (1) TABLET AT BEDTIME  . SYMBICORT 160-4.5 MCG/ACT inhaler USE 2 PUFFS TWICE DAILY  . testosterone cypionate (DEPOTESTOTERONE CYPIONATE) 100 MG/ML injection Inject 100 mg into the muscle every 7 (seven) days. On Fridays only uses 1/2 ml  For IM use only  . [DISCONTINUED] albuterol (PROVENTIL HFA;VENTOLIN HFA) 108 (90 BASE) MCG/ACT inhaler Inhale 2 puffs into the lungs every 6 (six) hours as needed for wheezing or shortness of breath.  . [DISCONTINUED] tiZANidine (ZANAFLEX) 4 MG capsule Take 2 mg by mouth as needed for muscle spasms. Reported on 04/26/2016   No facility-administered encounter medications on file as of 04/26/2016.

## 2016-04-26 NOTE — Assessment & Plan Note (Signed)
I continue to struggle with Cody Day. He complains of dyspnea which has been worsening this year which seems to improve with prednisone. However, he has not had wheezing on exam and on repeated lung function testing he's had evidence of airflow reversibility, but it's never been severe.  Further, with combination therapy (Symbicort and Qvar) he initially had some improvement but even this has not been long lasting.  I still think this is likely an poorly controlled asthma but in the absence of elevated serum eosinophils or elevated serum IgE struggling with the next step to help control his disease other than telling him to quit work altogether because of his work environment.  Plan: Refer to [facility] for second opinion, asthma and allergy Center Continue Symbicort and Qvar Consider repeat PFT and CT scan if [facility] referral cannot be performed.

## 2016-04-26 NOTE — Telephone Encounter (Signed)
Not sure why not on list. Per last pulm note pt was to be using regularly.

## 2016-04-29 ENCOUNTER — Telehealth: Payer: Self-pay | Admitting: Pulmonary Disease

## 2016-04-29 NOTE — Telephone Encounter (Signed)
Pt returned call about his appointment, gave him the info which he knew about, guess dr que's office called him  About it.Cody Day

## 2016-04-29 NOTE — Telephone Encounter (Signed)
Referral Notes     Type Date User   General 04/26/2016 4:56 PM MCMICHAEL, SHERRY P        Note   Sched for 8/15 at 11:00 for pft & 1:00 with Dr Fabio Bering. Called pt & left vm for him to call me back for appt info. Records/referral faxed to Dr Ulla Potash office.     ATC pt. Unable to LVM as no VM is set up.

## 2016-06-06 ENCOUNTER — Telehealth: Payer: Self-pay | Admitting: *Deleted

## 2016-06-06 NOTE — Telephone Encounter (Deleted)
Received request for PA for Symbicort.  PA completed on CoverMyMeds.  Awaiting decision.

## 2016-06-06 NOTE — Telephone Encounter (Addendum)
Received fax from CoverMyMeds for PA follow up. We do not have any prescription drug card in this patient's chart.  Called patient to get phone number for insurance company.  Spoke with Christella Scheuermann and they state this medication is covered but patient has a high deductible he must meet first.  Mr. Matters notified.  I am going to  mail him an prescription discount coupon for the Symbicort that he can take to [facility] to see if that will help with getting his medication,  until deductible is met.

## 2016-06-11 ENCOUNTER — Telehealth: Payer: Self-pay

## 2016-06-11 DIAGNOSIS — R5383 Other fatigue: Secondary | ICD-10-CM

## 2016-06-11 NOTE — Telephone Encounter (Signed)
Spoke with pt, aware of recs.  Scheduled on 9/1 for a 67mw in [place] office, and PSG ordered for BT.  Nothing further needed.

## 2016-06-11 NOTE — Telephone Encounter (Signed)
-----   Message from Juanito Doom, MD sent at 06/11/2016  3:13 PM EDT ----- A, His asthma doc at [facility] contacted me and requested that we order a polysomnogram for fatigue and repeat a 6MW.  Both could be done in [place]. Thanks B

## 2016-06-21 ENCOUNTER — Ambulatory Visit (INDEPENDENT_AMBULATORY_CARE_PROVIDER_SITE_OTHER): Payer: Managed Care, Other (non HMO) | Admitting: *Deleted

## 2016-06-21 ENCOUNTER — Other Ambulatory Visit: Payer: Managed Care, Other (non HMO)

## 2016-06-21 DIAGNOSIS — R0600 Dyspnea, unspecified: Secondary | ICD-10-CM | POA: Diagnosis not present

## 2016-06-21 NOTE — Progress Notes (Signed)
SMW performed today. 

## 2016-06-28 ENCOUNTER — Encounter: Payer: Managed Care, Other (non HMO) | Admitting: Family Medicine

## 2016-07-12 ENCOUNTER — Other Ambulatory Visit: Payer: Managed Care, Other (non HMO)

## 2016-07-19 ENCOUNTER — Encounter: Payer: Managed Care, Other (non HMO) | Admitting: Family Medicine

## 2016-07-28 IMAGING — CT CT CHEST HIGH RESOLUTION W/O CM
2 of 5 series · 15 of 33 positions shown, 18 images · non-contrast
Comparison: Chest CT 10/23/2012.

CLINICAL DATA: 59-year-old male with difficulty breathing for the
past 3 weeks. Stiffness and heaviness in the chest.

EXAM:
CT CHEST WITHOUT CONTRAST
TECHNIQUE: Multidetector CT imaging of the chest was performed following the
standard protocol without intravenous contrast. High resolution
imaging of the lungs, as well as inspiratory and expiratory imaging,
was performed.

[Series 2: routine chest wo · axial · 0.74mm/px · z∈[-288,-128]mm · 6 of 65 slices shown]
[im 9/65  lung]
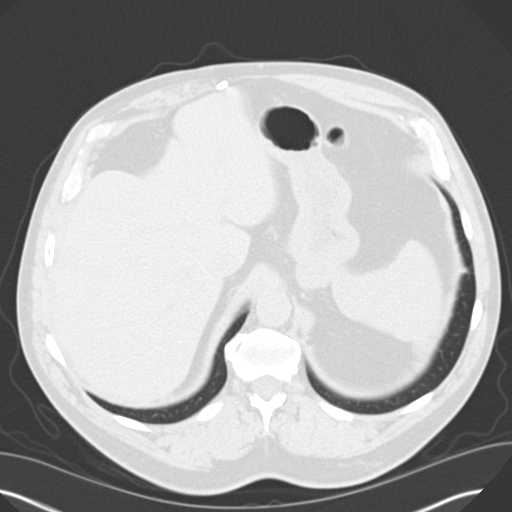
[im 17/65  lung]
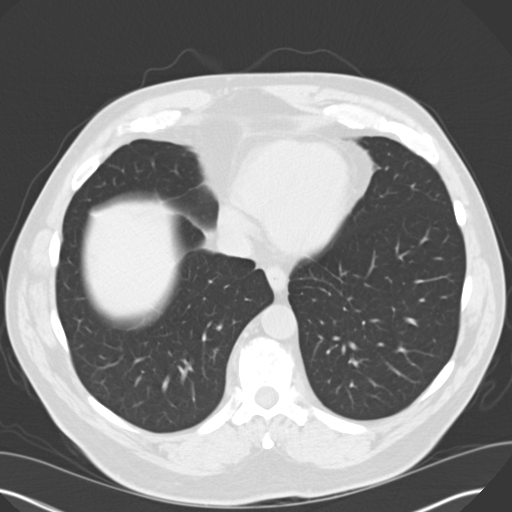
[im 25/65  lung]
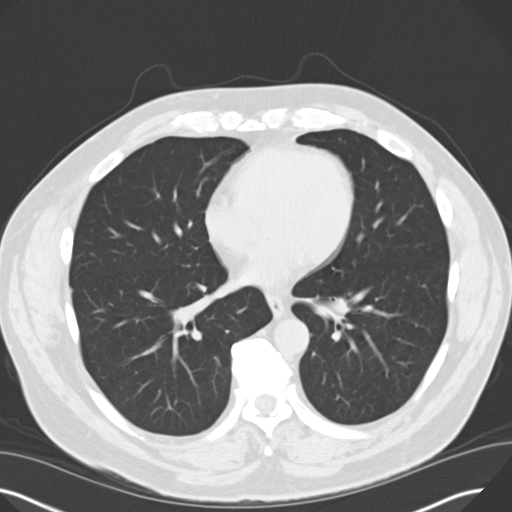
[im 32/65  lung]
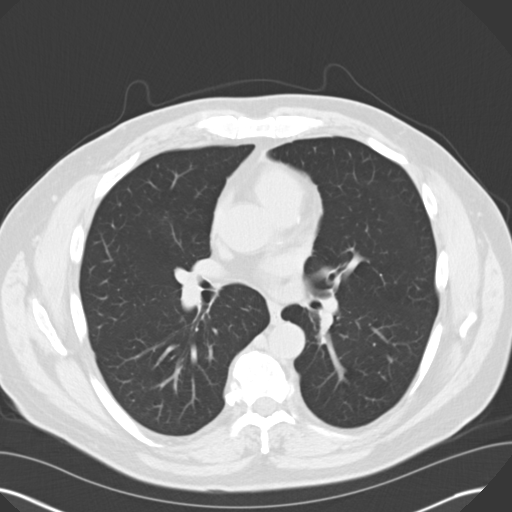
[im 33/65  lung]
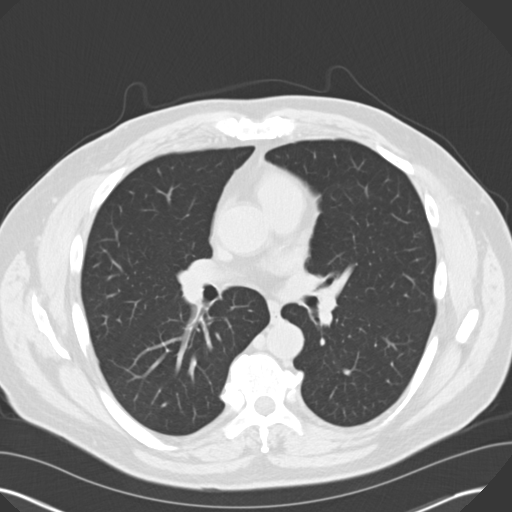
[im 41/65  lung]
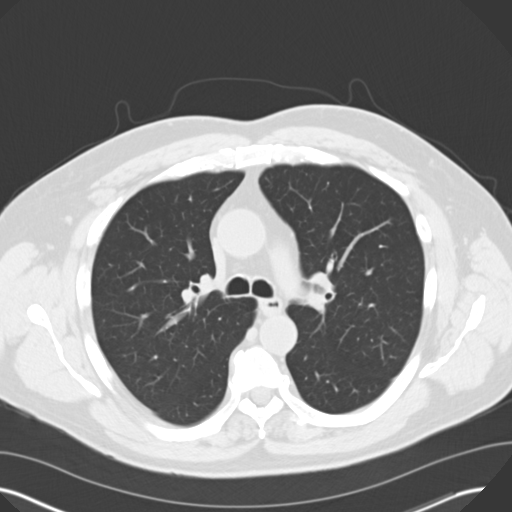

[Series 3: lung · axial · 0.74mm/px · z∈[-288,-43]mm · 9 of 63 slices shown, 12 images]
[im 7/63  mediastinal]
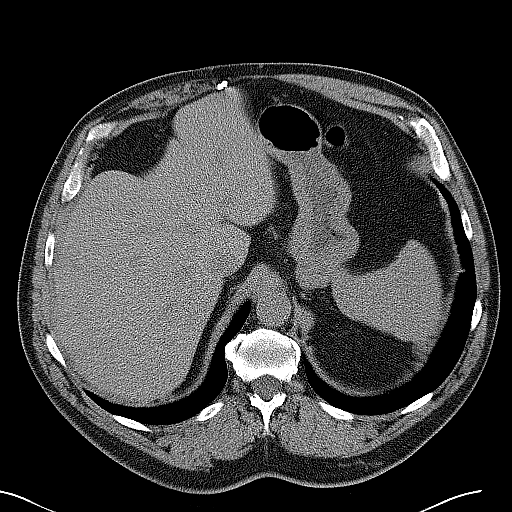
[im 7/63  lung]
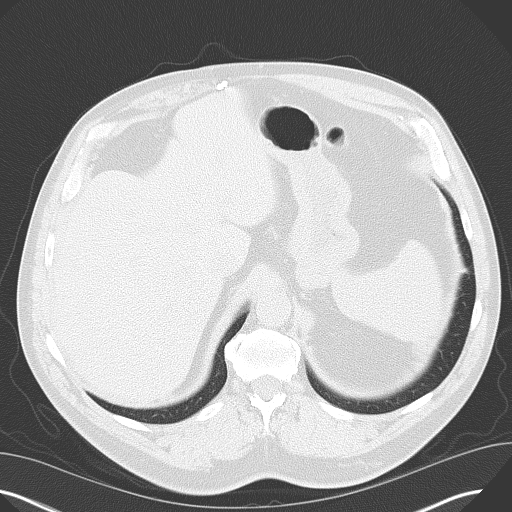
[im 14/63  lung]
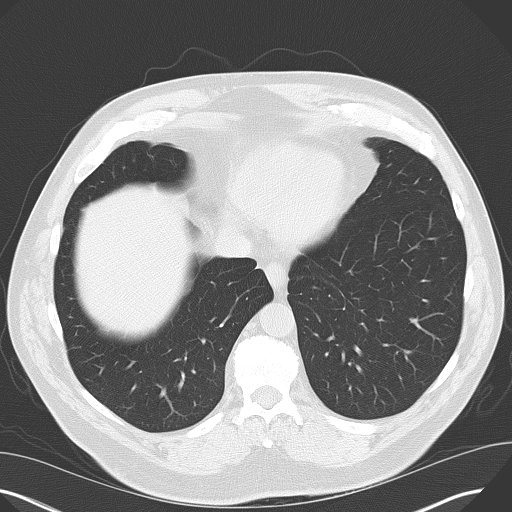
[im 21/63  lung]
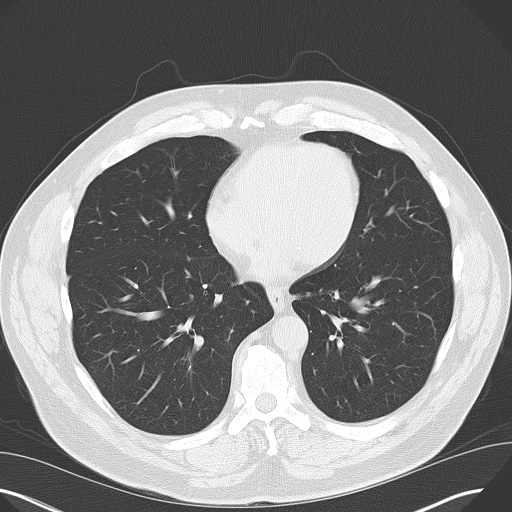
[im 28/63  lung]
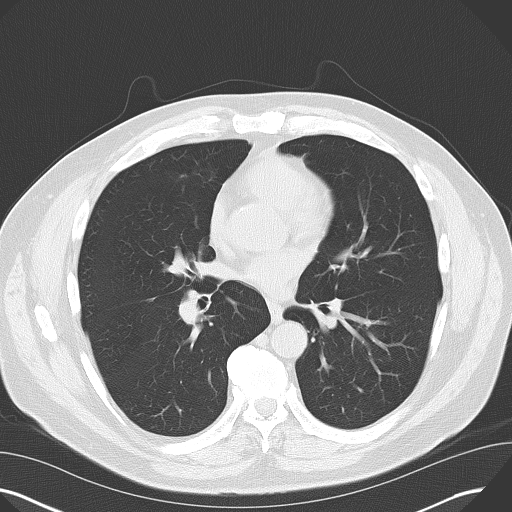
[im 30/63  mediastinal]
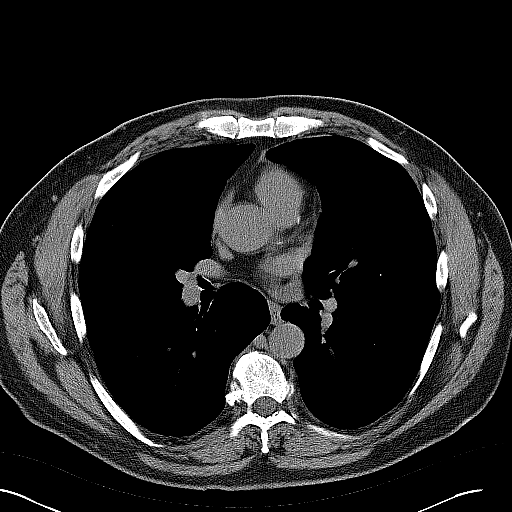
[im 30/63  lung]
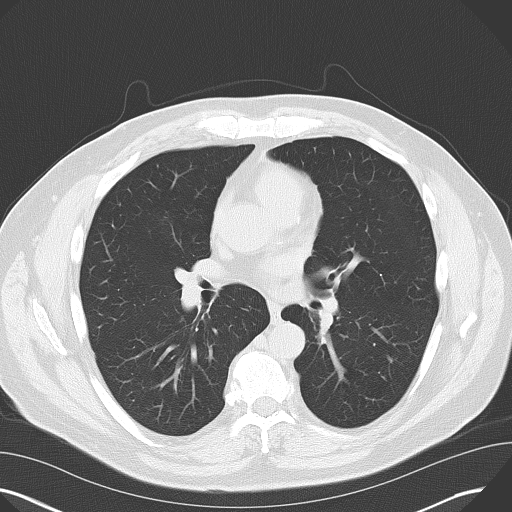
[im 35/63  lung]
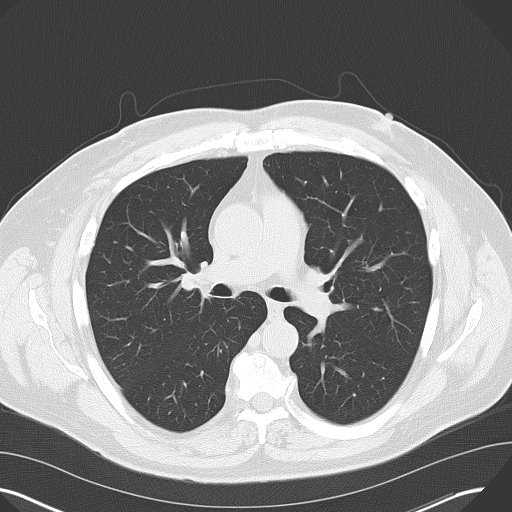
[im 42/63  lung]
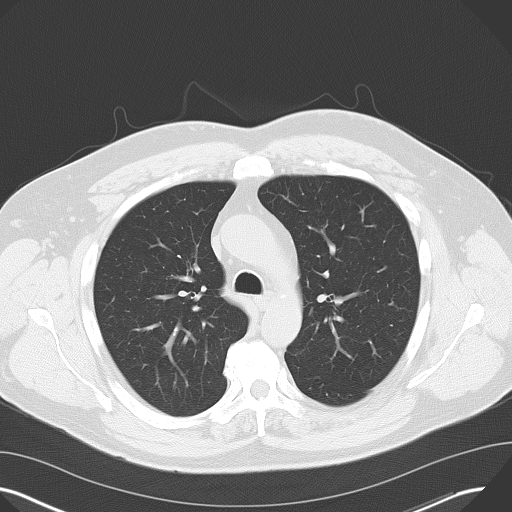
[im 49/63  lung]
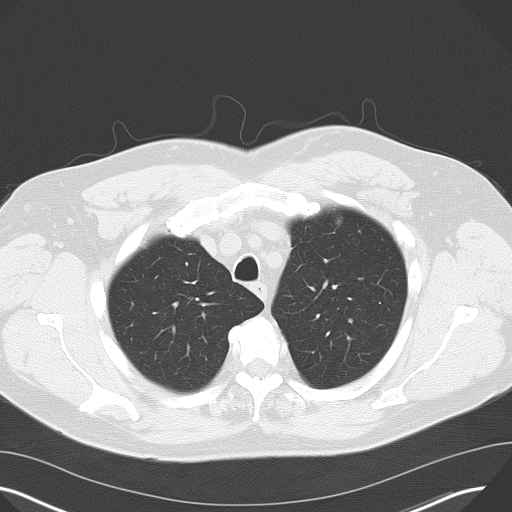
[im 56/63  mediastinal]
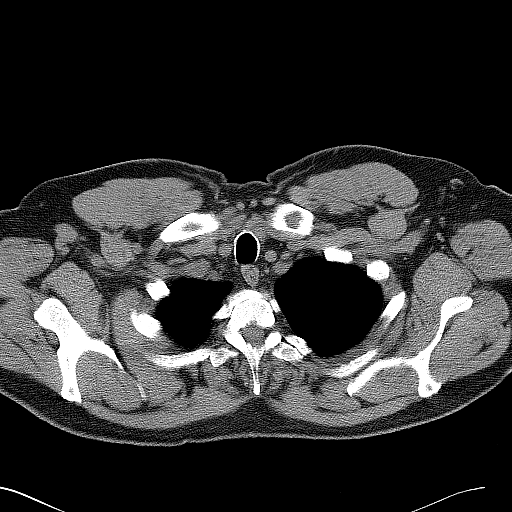
[im 56/63  lung]
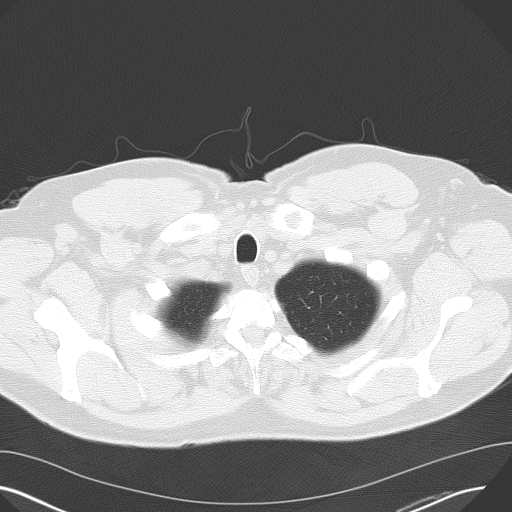

[15 of 33 positions shown; findings below may reference images not displayed]

FINDINGS: Mediastinum/Lymph Nodes: Heart size is normal. There is no
significant pericardial fluid, thickening or pericardial
calcification. There is atherosclerosis of the thoracic aorta, the
great vessels of the mediastinum and the coronary arteries,
including calcified atherosclerotic plaque in the left main, left
anterior descending, left circumflex and right coronary arteries.
Mild lipomatous hypertrophy of the interatrial septum (a benign
finding), incidentally noted. No pathologically enlarged mediastinal
or hilar lymph nodes. Please note that accurate exclusion of hilar
adenopathy is limited on noncontrast CT scans. Esophagus is
unremarkable in appearance.

Lungs/Pleura: High-resolution images demonstrate no significant
ground-glass attenuation, subpleural reticulation, parenchymal
banding, traction bronchiectasis or frank honeycombing to indicate
interstitial lung disease. Inspiratory and expiratory imaging is
unremarkable. No acute consolidative airspace disease. No pleural
effusions. No suspicious appearing pulmonary nodules or masses are
noted.

Upper Abdomen: Numerous tiny sub cm low-attenuation lesions
scattered throughout the liver are too small to definitively
characterize, but are favored to represent tiny cysts. Postoperative
changes of mesh repair for anterior abdominal wall hernia are
incompletely visualized in the right upper quadrant.

Musculoskeletal/Soft Tissues: There are no aggressive appearing
lytic or blastic lesions noted in the visualized portions of the
skeleton.
IMPRESSION: 1. No findings to suggest interstitial lung disease.
2. No acute findings in the thorax.
3. Atherosclerosis, including left main and 3 vessel coronary artery
disease. Please note that although the presence of coronary artery
calcium documents the presence of coronary artery disease, the
severity of this disease and any potential stenosis cannot be
assessed on this non-gated CT examination. Assessment for potential
risk factor modification, dietary therapy or pharmacologic therapy
may be warranted, if clinically indicated.
4. Additional incidental findings, as above.

## 2016-07-29 ENCOUNTER — Ambulatory Visit (INDEPENDENT_AMBULATORY_CARE_PROVIDER_SITE_OTHER): Payer: 59 | Admitting: Pulmonary Disease

## 2016-07-29 ENCOUNTER — Encounter: Payer: Self-pay | Admitting: Pulmonary Disease

## 2016-07-29 DIAGNOSIS — J4530 Mild persistent asthma, uncomplicated: Secondary | ICD-10-CM

## 2016-07-29 DIAGNOSIS — A0472 Enterocolitis due to Clostridium difficile, not specified as recurrent: Secondary | ICD-10-CM | POA: Insufficient documentation

## 2016-07-29 DIAGNOSIS — R0602 Shortness of breath: Secondary | ICD-10-CM

## 2016-07-29 DIAGNOSIS — G4733 Obstructive sleep apnea (adult) (pediatric): Secondary | ICD-10-CM

## 2016-07-29 DIAGNOSIS — Z23 Encounter for immunization: Secondary | ICD-10-CM | POA: Diagnosis not present

## 2016-07-29 NOTE — Assessment & Plan Note (Addendum)
He has been non-compliant with his CPAP machine but has significant fatigue and his weight has changed. Since it's been so long since his last CPAP titration study of think we need to repeat this.  Plan CPAP titration study

## 2016-07-29 NOTE — Addendum Note (Signed)
Addended by: Len Blalock on: 07/29/2016 04:01 PM   Modules accepted: Orders

## 2016-07-29 NOTE — Progress Notes (Signed)
Subjective:    Patient ID: Cody Day, male    DOB: 04/13/1956, 60 y.o.   MRN: [id number]  Synopsis: Tykwan Irene first saw the [facility] pulmonary clinic in the spring of 2015 for shortness of breath, cough, mucus production. He had lung function testing in 2014 which was completely normal. He had a past history significant for long-term inhaled illicit drug use with multiple substances including methamphetamine.  Later PFT findings have shown airflow obstruction.   HPI Chief Complaint  Patient presents with  . Follow-up    pt c/o stable SOB, frequent throat clearing, prod cough with thick clear mucus worse qam that improves as the day progresses. Pt is being seen at [facility] also.     Cody Day is still short of breath.  He has been through a big work up lately.  He has dyspnea with minimal activity.  He still has wheeze and chest congestion.  He still produces white phlegm in the mornings, he thinks this comes from sinus congestion. He hasn't used his CPAP in a long time.    He had diarrhea and was diagnosed with c diff about a month ago. He has been taking probiotics and his diarrhea is slowing down.    He has not used his CPAP machine in years.  He couldn't do the test a month ago due to C diff.    He is not taking QVar.  He is taking Spiriva and Symbicort.     Past Medical History:  Diagnosis Date  . Achilles tendon rupture 10/11   and repair  . Adenomatous polyp of colon 04/06/2010   Adenomatous polyps 3 in the descending colon, times one in the ascending colon [facility])  . Asthma   . Gout   . Hemorrhoids   . Hypercholesteremia   . Hypertension   . Sleep apnea   . Syncope 10/11   in settin gof asthma exacerbation w coughing. Ech (10/11): EF > 55%, mild LVH, grade I diastolic dysfunction, nomral RV size and systolic function,. normal valves. Carotid US (10/11): minimal disease       Review of Systems  Constitutional: Positive for fatigue. Negative for  chills and fever.  HENT: Negative for congestion, postnasal drip, rhinorrhea and sinus pressure.   Respiratory: Positive for cough, shortness of breath and wheezing.   Cardiovascular: Negative for chest pain and palpitations.  Gastrointestinal: Negative for nausea and vomiting.  Musculoskeletal: Negative for myalgias.   Objective:   Physical Exam Vitals:   07/29/16 1504  BP: 128/78  BP Location: Right Arm  Cuff Size: Normal  Pulse: 94  SpO2: 98%  Weight: 218 lb 3.2 oz (99 kg)  Height: 5\' 11"  (1.803 m)   RA  Gen: chronically ill appearing, no acute distress HEENT: NCAT, EOMi, OP clear, PULM: No wheezing, Normal effort today CV: RRR, no mgr, no JVD AB: BS+, soft, nontender, no hsm Ext: warm, no edema, no clubbing, no cyanosis   CBC    Component Value Date/Time   WBC 8.3 02/08/2016 1619   RBC 5.88 (H) 02/08/2016 1619   HGB 15.1 02/08/2016 1619   HGB 13.5 10/23/2012 1346   HCT 46.2 02/08/2016 1619   HCT 41.2 10/23/2012 1346   PLT 293.0 02/08/2016 1619   PLT 257 10/23/2012 1346   MCV 78.7 02/08/2016 1619   MCV 72 (L) 10/23/2012 1346   MCH 23.4 (L) 10/23/2012 1346   MCH 21.8 (L) 11/12/2011 1217   MCHC 32.6 02/08/2016 1619   RDW  19.3 (H) 02/08/2016 1619   RDW 17.2 (H) 10/23/2012 1346   LYMPHSABS 2.2 02/08/2016 1619   MONOABS 1.1 (H) 02/08/2016 1619   EOSABS 0.1 02/08/2016 1619   BASOSABS 0.0 02/08/2016 1619    Records from his visit with [facility] reviewed, July 2017 echocardiogram showed poor acoustic windows but an LVEF around 50-55%  Assessment & Plan:   OSA (obstructive sleep apnea) He has been non-compliant with his CPAP machine but has significant fatigue and his weight has changed. Since it's been so long since his last CPAP titration study of think we need to repeat this.  Plan CPAP titration study  Asthma, chronic He will continue taking the Symbicort and Spiriva as directed by the [facility].  He will continue f/u with them.   Continue as needed albuterol.   Flu shot today  Dyspnea Continue workup as per [facility]. I discussed his case with his pulmonologist at [facility] last week. Our plans are for him to have a left and right heart catheterization soon.  If the left and right heart catheterization are unrevealing then I think the best approach would be to start a more aggressive exercise routine as I think he is profoundly deconditioned and there is a component of anxiety contributing to this.  Enteritis due to Clostridium difficile This was recently diagnosed but fortunately has started to improve. I encouraged him to continue taking probiotics    07/29/16 3:55 PM  Updated Medication List Outpatient Encounter Prescriptions as of 07/29/2016  Medication Sig  . albuterol (PROVENTIL HFA;VENTOLIN HFA) 108 (90 Base) MCG/ACT inhaler Inhale 2 puffs into the lungs every 6 (six) hours as needed for wheezing or shortness of breath.  Marland Kitchen albuterol (PROVENTIL) (2.5 MG/3ML) 0.083% nebulizer solution Take 3 mLs (2.5 mg total) by nebulization every 4 (four) hours as needed for wheezing or shortness of breath.  . allopurinol (ZYLOPRIM) 100 MG tablet TAKE ONE (1) TABLET EACH DAY  . B Complex-C (SUPER B COMPLEX PO) Take 1 tablet by mouth daily.  Marland Kitchen lisinopril (PRINIVIL,ZESTRIL) 20 MG tablet TAKE ONE (1) TABLET EACH DAY  . montelukast (SINGULAIR) 10 MG tablet Take 1 tablet (10 mg total) by mouth daily.  . Multiple Vitamins-Minerals (MULTIVITAL) tablet Take 1 tablet by mouth daily.    . primidone (MYSOLINE) 50 MG tablet TAKE ONE TABLET TWICE DAILY  . simvastatin (ZOCOR) 20 MG tablet TAKE ONE (1) TABLET AT BEDTIME  . SYMBICORT 160-4.5 MCG/ACT inhaler USE 2 PUFFS TWICE DAILY  . testosterone cypionate (DEPOTESTOTERONE CYPIONATE) 100 MG/ML injection Inject 100 mg into the muscle every 7 (seven) days. On Fridays only uses 1/2 ml  For IM use only  . tiotropium (SPIRIVA) 18 MCG inhalation capsule Place 18 mcg into inhaler and inhale  daily.  . [DISCONTINUED] beclomethasone (QVAR) 80 MCG/ACT inhaler Inhale 2 puffs into the lungs 2 (two) times daily. (Patient not taking: Reported on 07/29/2016)  . [DISCONTINUED] Cinnamon 500 MG capsule Take 500 mg by mouth daily.  . [DISCONTINUED] predniSONE (DELTASONE) 10 MG tablet Take 60mg  daily for 7 days, then take 50mg  daily for 7 days, then take 40mg  daily for 7 days, then take 30mg  daily for 7 days, then take 20mg  daily for 7 days, then take 10mg  daily for 7 days   No facility-administered encounter medications on file as of 07/29/2016.

## 2016-07-29 NOTE — Assessment & Plan Note (Signed)
He will continue taking the Symbicort and Spiriva as directed by the [facility].  He will continue f/u with them.  Continue as needed albuterol.   Flu shot today

## 2016-07-29 NOTE — Assessment & Plan Note (Addendum)
Continue workup as per [facility]. I discussed his case with his pulmonologist at [facility] last week. Our plans are for him to have a left and right heart catheterization soon.  If the left and right heart catheterization are unrevealing then I think the best approach would be to start a more aggressive exercise routine as I think he is profoundly deconditioned and there is a component of anxiety contributing to this.

## 2016-07-29 NOTE — Patient Instructions (Signed)
I recommend that she drink liquid yogurt as a form of probiotic, twice a day We will arrange for a CPAP titration study Keep taking your medicines as you are doing Keep your follow-up with [facility] I'll see you back in 3 months

## 2016-07-29 NOTE — Addendum Note (Signed)
Addended by: Len Blalock on: 07/29/2016 04:39 PM   Modules accepted: Orders

## 2016-07-29 NOTE — Assessment & Plan Note (Signed)
This was recently diagnosed but fortunately has started to improve. I encouraged him to continue taking probiotics

## 2016-07-30 ENCOUNTER — Other Ambulatory Visit: Payer: Self-pay | Admitting: Family Medicine

## 2016-08-07 ENCOUNTER — Encounter: Payer: Self-pay | Admitting: Family Medicine

## 2016-08-07 ENCOUNTER — Ambulatory Visit (INDEPENDENT_AMBULATORY_CARE_PROVIDER_SITE_OTHER): Payer: 59 | Admitting: Family Medicine

## 2016-08-07 VITALS — BP 152/106 | HR 104 | Temp 98.5°F | Ht 71.0 in | Wt 218.0 lb

## 2016-08-07 DIAGNOSIS — R21 Rash and other nonspecific skin eruption: Secondary | ICD-10-CM | POA: Diagnosis not present

## 2016-08-07 MED ORDER — TRIAMCINOLONE ACETONIDE 0.1 % EX CREA: 1.0000 "application " | TOPICAL_CREAM | Freq: Two times a day (BID) | CUTANEOUS | 1 refills | Status: DC

## 2016-08-07 MED ORDER — PREDNISONE 20 MG PO TABS: ORAL_TABLET | ORAL | 0 refills | Status: DC

## 2016-08-07 NOTE — Progress Notes (Signed)
Pre visit review using our clinic review tool, if applicable. No additional management support is needed unless otherwise documented below in the visit note. 

## 2016-08-07 NOTE — Progress Notes (Signed)
Dr. Frederico Hamman T. Keerstin Bjelland, MD, [facility] [place], [address] Phone: [phone] Fax: [phone]  08/07/2016  Patient: Cody Day, MRN: [id number], DOB: [birth year], 60 y.o.  Primary Physician:  Eliezer Lofts, MD   Chief Complaint  Patient presents with  . Rash   Subjective:   Cody Day is a 60 y.o. very pleasant male patient who presents with the following:  Diffuse rash, arms, little bit on lower legs. Is quite itchy.  He has no known exposure that he can think of.  He has not tried anything at home to make it feel better.  No topical or new oral ingestion.  Baseline tremor.   Past Medical History, Surgical History, Social History, Family History, Problem List, Medications, and Allergies have been reviewed and updated if relevant.  Patient Active Problem List   Diagnosis Date Noted  . Enteritis due to Clostridium difficile 07/29/2016  . History of colonic polyps 06/30/2015  . Cardiomyopathy 04/25/2015  . Dyspnea 03/22/2015  . Neuropathy ([place]) 07/28/2014  . Benign essential tremor 07/28/2014  . History of ventral hernia repair 05/24/2014  . Allergic rhinitis 03/21/2014  . Memory loss 03/26/2013  . Generalized anxiety disorder 12/01/2012  . OSA (obstructive sleep apnea) 12/05/2011  . NECK PAIN, RIGHT 05/04/2008  . Coronary atherosclerosis 12/08/2007  . PROSTATITIS, CHRONIC 08/20/2007  . NEPHROLITHIASIS, HX OF 08/20/2007  . Hyperlipidemia LDL goal <70 06/17/2007  . Prediabetes 06/17/2007  . Gout 06/09/2007  . MORBID OBESITY 06/09/2007  . Essential hypertension, benign 06/09/2007  . Asthma, chronic 06/09/2007    Past Medical History:  Diagnosis Date  . Achilles tendon rupture 10/11   and repair  . Adenomatous polyp of colon 04/06/2010   Adenomatous polyps 3 in the descending colon, times one in the ascending colon [facility])  . Asthma   . Gout   . Hemorrhoids   . Hypercholesteremia   .  Hypertension   . Sleep apnea   . Syncope 10/11   in settin gof asthma exacerbation w coughing. Ech (10/11): EF > 55%, mild LVH, grade I diastolic dysfunction, nomral RV size and systolic function,. normal valves. Carotid US (10/11): minimal disease    Past Surgical History:  Procedure Laterality Date  . ABDOMINAL HERNIA REPAIR  1998,2000  . calcium kidney stones    . [facility], [name], [place]; Dr. [name]. Multiple adenomatous polyps (total 4).   . COLONOSCOPY WITH PROPOFOL N/A 08/30/2015   Procedure: COLONOSCOPY WITH PROPOFOL;  Surgeon: [name], MD;  Location: [facility] ENDOSCOPY;  Service: Endoscopy;  Laterality: N/A;  . FEMORAL HERNIA REPAIR  2007  . HEMORRHOID SURGERY    . HERNIA REPAIR  09/10/2010   Repair of recurrent ventral hernia, resection of previously placed mesh x2, implantation 7.5 x 10 cm Gore-Tex dual mesh in an underlay position, repair of epigastric hernia with a 4.2 cm Proceed ventral patch.  Marland Kitchen HERNIA REPAIR   02//28/2007   Laparoscopic right inguinal hernia repair with Surgipro mesh, Ventrilex patch at umbilicus  . HERNIA REPAIR  11/01/1990  .   Small oval Kugel patch placed in preperitoneal space, [name], MD  . HERNIA REPAIR  01/17/1997   Recurrent ventral hernia 15 x 19 Gore-Tex dual mesh placed laparoscopically with multiple lefft--sided ports, [name], MD  . HERNIA REPAIR  01/07/1995    Primary repair of  ventral hernia. [name], MD  . intestinal blockage     as a child  . KIDNEY STONE extraction     unic acid stones  . left heart cath  2008   minimal luminal irregularities EF 55%  . treadmill stress test  2003    Social History   Social History  . Marital status: Married    Spouse name: N/A  . Number of children: Y  . Years of education: N/A   Occupational History  . 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 Smoking  status: Former Smoker    Packs/day: 0.25    Years: 35.00    Types: Cigarettes    Quit date: 10/22/1995  . Smokeless tobacco: Current User    Types: Chew     Comment: rarely smoked cigarettes and cigars.  states he smoked drugs mostly  . Alcohol use 0.0 oz/week     Comment: 6 beers daily; used to use cocaine and methamphetamine  . Drug use: No     Comment: former smokerx 27 years  . Sexual activity: Not on file   Other Topics Concern  . Not on file   Social History Narrative   Poor diet, lots of fats. Does not regularly exercise. Married. Originally from [place].     Family History  Problem Relation Age of Onset  . Heart attack Mother 1  . Coronary artery disease Mother   . Hypertension Father   . Cataracts Father   . Diabetes Father   . Hypertension Sister   . Depression Sister   . Hypertension Brother   . Colon cancer Brother   . Diabetes Other     1st degree relative  . Colon cancer Maternal Aunt     Allergies  Allergen Reactions  . Levaquin [Levofloxacin In D5w]     Severe headaches, skin tingling/redness  . Penicillins     REACTION: (Childhood - bumps, rash)    Medication list reviewed and updated in full in [place].   GEN: No acute illnesses, no fevers, chills. GI: No n/v/d, eating normally Pulm: No SOB Interactive and getting along well at home.  Otherwise, ROS is as per the HPI.  Objective:   BP (!) 152/106   Pulse (!) 104   Temp 98.5 F (36.9 C) (Oral)   Ht 5\' 11"  (1.803 m)   Wt 218 lb (98.9 kg)   BMI 30.40 kg/m   GEN: WDWN, NAD, Non-toxic, A & O x 3 HEENT: Atraumatic, Normocephalic. Neck supple. No masses, No LAD. Ears and Nose: No external deformity. EXTR: No c/c/e NEURO remarkable tremor at baseline  PSYCH: Normally interactive. Conversant. Not depressed or anxious appearing.  Calm demeanor.   Scattered red rash with minimal elevation on his arms primarily and to a lesser extent on the torso.  No significant  scale.  Laboratory and Imaging Data:  Assessment and Plan:   Rash and nonspecific skin eruption  Unclear origin.  Very on conquerable.  Topical triamcinolone b.i.d., along with oral prednisone.  Follow-up: No Follow-up on file.  New Prescriptions   PREDNISONE (DELTASONE) 20 MG TABLET    2 tabs po for 4 days, then 1 tab po for 4 days   TRIAMCINOLONE CREAM (KENALOG) 0.1 %    Apply 1 application topically 2 (two) times daily.   Signed,  Maud Deed. Griffin Gerrard, MD   Patient's Medications  New Prescriptions   PREDNISONE (DELTASONE) 20 MG TABLET    2  tabs po for 4 days, then 1 tab po for 4 days   TRIAMCINOLONE CREAM (KENALOG) 0.1 %    Apply 1 application topically 2 (two) times daily.  Previous Medications   ALBUTEROL (PROVENTIL HFA;VENTOLIN HFA) 108 (90 BASE) MCG/ACT INHALER    Inhale 2 puffs into the lungs every 6 (six) hours as needed for wheezing or shortness of breath.   ALBUTEROL (PROVENTIL) (2.5 MG/3ML) 0.083% NEBULIZER SOLUTION    Take 3 mLs (2.5 mg total) by nebulization every 4 (four) hours as needed for wheezing or shortness of breath.   ALLOPURINOL (ZYLOPRIM) 100 MG TABLET    TAKE ONE (1) TABLET EACH DAY   B COMPLEX-C (SUPER B COMPLEX PO)    Take 1 tablet by mouth daily.   LISINOPRIL (PRINIVIL,ZESTRIL) 20 MG TABLET    TAKE ONE (1) TABLET EACH DAY   MONTELUKAST (SINGULAIR) 10 MG TABLET    Take 1 tablet (10 mg total) by mouth daily.   MULTIPLE VITAMINS-MINERALS (MULTIVITAL) TABLET    Take 1 tablet by mouth daily.     PRIMIDONE (MYSOLINE) 50 MG TABLET    TAKE ONE TABLET TWICE DAILY   SIMVASTATIN (ZOCOR) 20 MG TABLET    TAKE ONE (1) TABLET AT BEDTIME   SYMBICORT 160-4.5 MCG/ACT INHALER    USE 2 PUFFS TWICE DAILY   TESTOSTERONE CYPIONATE (DEPOTESTOTERONE CYPIONATE) 100 MG/ML INJECTION    Inject 100 mg into the muscle every 7 (seven) days. On Fridays only uses 1/2 ml  For IM use only   TIOTROPIUM (SPIRIVA) 18 MCG INHALATION CAPSULE    Place 18 mcg into inhaler and inhale daily.   Modified Medications   No medications on file  Discontinued Medications   No medications on file

## 2016-08-21 ENCOUNTER — Telehealth: Payer: Self-pay | Admitting: Family Medicine

## 2016-08-21 DIAGNOSIS — R7303 Prediabetes: Secondary | ICD-10-CM

## 2016-08-21 DIAGNOSIS — E785 Hyperlipidemia, unspecified: Secondary | ICD-10-CM

## 2016-08-21 NOTE — Telephone Encounter (Signed)
-----   Message from Marchia Bond sent at 08/16/2016 11:19 AM EDT ----- Regarding: Cpx labs Thurs 11/2, need orders. Thanks! :-) Please order  future cpx labs for pt's upcoming lab appt. Thanks Aniceto Boss

## 2016-08-23 ENCOUNTER — Other Ambulatory Visit (INDEPENDENT_AMBULATORY_CARE_PROVIDER_SITE_OTHER): Payer: 59

## 2016-08-23 DIAGNOSIS — R7989 Other specified abnormal findings of blood chemistry: Secondary | ICD-10-CM | POA: Diagnosis not present

## 2016-08-23 DIAGNOSIS — E785 Hyperlipidemia, unspecified: Secondary | ICD-10-CM

## 2016-08-23 DIAGNOSIS — R7303 Prediabetes: Secondary | ICD-10-CM | POA: Diagnosis not present

## 2016-08-23 LAB — COMPREHENSIVE METABOLIC PANEL
ALT: 42 U/L (ref 0–53)
AST: 55 U/L — ABNORMAL HIGH (ref 0–37)
Albumin: 3.9 g/dL (ref 3.5–5.2)
Alkaline Phosphatase: 61 U/L (ref 39–117)
BUN: 6 mg/dL (ref 6–23)
CO2: 24 mEq/L (ref 19–32)
Calcium: 9.2 mg/dL (ref 8.4–10.5)
Chloride: 103 mEq/L (ref 96–112)
Creatinine, Ser: 0.77 mg/dL (ref 0.40–1.50)
GFR: 109.62 mL/min (ref >60.00)
Glucose, Bld: 118 mg/dL — ABNORMAL HIGH (ref 70–99)
Potassium: 3.7 mEq/L (ref 3.5–5.1)
Sodium: 138 mEq/L (ref 135–145)
Total Bilirubin: 0.8 mg/dL (ref 0.2–1.2)
Total Protein: 6.6 g/dL (ref 6.0–8.3)

## 2016-08-23 LAB — LIPID PANEL
Cholesterol: 174 mg/dL (ref 0–200)
HDL: 97.1 mg/dL (ref >39.00)
NonHDL: 76.75
Total CHOL/HDL Ratio: 2
Triglycerides: 257 mg/dL — ABNORMAL HIGH (ref 0.0–149.0)
VLDL: 51.4 mg/dL — ABNORMAL HIGH (ref 0.0–40.0)

## 2016-08-23 LAB — LDL CHOLESTEROL, DIRECT: Direct LDL: 53 mg/dL

## 2016-08-23 LAB — HEMOGLOBIN A1C: Hgb A1c MFr Bld: 6.2 % (ref 4.6–6.5)

## 2016-08-27 ENCOUNTER — Telehealth: Payer: Self-pay | Admitting: Family Medicine

## 2016-08-27 ENCOUNTER — Encounter: Payer: Self-pay | Admitting: Family Medicine

## 2016-08-27 ENCOUNTER — Ambulatory Visit (INDEPENDENT_AMBULATORY_CARE_PROVIDER_SITE_OTHER): Payer: 59 | Admitting: Family Medicine

## 2016-08-27 VITALS — BP 140/88 | HR 102 | Temp 98.9°F | Ht 71.0 in | Wt 217.0 lb

## 2016-08-27 DIAGNOSIS — R7303 Prediabetes: Secondary | ICD-10-CM | POA: Diagnosis not present

## 2016-08-27 DIAGNOSIS — I10 Essential (primary) hypertension: Secondary | ICD-10-CM

## 2016-08-27 DIAGNOSIS — Z0000 Encounter for general adult medical examination without abnormal findings: Secondary | ICD-10-CM

## 2016-08-27 DIAGNOSIS — J4530 Mild persistent asthma, uncomplicated: Secondary | ICD-10-CM | POA: Diagnosis not present

## 2016-08-27 DIAGNOSIS — E785 Hyperlipidemia, unspecified: Secondary | ICD-10-CM

## 2016-08-27 DIAGNOSIS — R2681 Unsteadiness on feet: Principal | ICD-10-CM

## 2016-08-27 DIAGNOSIS — R2689 Other abnormalities of gait and mobility: Secondary | ICD-10-CM

## 2016-08-27 DIAGNOSIS — F411 Generalized anxiety disorder: Secondary | ICD-10-CM | POA: Diagnosis not present

## 2016-08-27 DIAGNOSIS — G250 Essential tremor: Secondary | ICD-10-CM

## 2016-08-27 DIAGNOSIS — M1A9XX0 Chronic gout, unspecified, without tophus (tophi): Secondary | ICD-10-CM

## 2016-08-27 DIAGNOSIS — R413 Other amnesia: Secondary | ICD-10-CM

## 2016-08-27 DIAGNOSIS — R251 Tremor, unspecified: Secondary | ICD-10-CM

## 2016-08-27 DIAGNOSIS — J4531 Mild persistent asthma with (acute) exacerbation: Secondary | ICD-10-CM

## 2016-08-27 NOTE — Telephone Encounter (Signed)
Mr. Schuerman notified as instructed by telephone.  He is agreeable with referral to Dr. Carles Collet.  He states that he thinks his sense of smell is normal.

## 2016-08-27 NOTE — Telephone Encounter (Signed)
Noted. Will make referral.

## 2016-08-27 NOTE — Progress Notes (Signed)
Pre visit review using our clinic review tool, if applicable. No additional management support is needed unless otherwise documented below in the visit note. 

## 2016-08-27 NOTE — Telephone Encounter (Signed)
Notify pt  ( seen earlier today) that he last saw neuro in 2015. I recommend referral to Movement specilist Dr. Carles Collet. Previous neuro was considering having him see Dr. Carles Collet at that time. Let t me know if agreeable.  Also ask if he has noted any smelling dysfunction?

## 2016-08-27 NOTE — Progress Notes (Signed)
60 year old male presents for wellness visit.   Anxiety, moderate control: Anxious when he is having shortness of breath.  +Depression. Feels tired a lot. He refuses medciaiton to treat.Marland Kitchen "I am fine"  Continues to have a tremor in hands. Dr. Delice Lesch neurology following. He feels it interferes with his job, cannot write. BBlockers not good optioon given severe asthma.  Stopped primidone given made him felt ill. Memory some what better off primidone. Tremor worse if under stress.  Hypertension: Borderline  control on lisinopril/HCTZ  BP Readings from Last 3 Encounters:  08/27/16 140/88  08/07/16 (!) 152/106  07/29/16 128/78  Using medication without problems or lightheadedness: None  Chest pain with exertion:None  Edema:None  Short of breath:Improved  Average home BPs:not checking.  Other issues:   Elevated Cholesterol:  At goal <70 given CAD on zocor 20 mg.  Lab Results  Component Value Date   CHOL 174 08/23/2016   HDL 97.10 08/23/2016   LDLCALC 74 07/14/2015   LDLDIRECT 53.0 08/23/2016   TRIG 257.0 (H) 08/23/2016   CHOLHDL 2 08/23/2016   Using medications without problems:none  Muscle aches: some Diet compliance:Good  Exercise: Limited given breathing Other complaints:   COPD/asthma/ Chronic SOB  Followed by pulm, Dr. Lake Bells.  symbicort and singuliar.  Using proventil for rescue off and on, usually daily.  He has been out of work since 06/2016 for shortness of breath. He has filed for disability. Has signed up for medicaid. Has history of 10 years of smoking meth amphetamine. No current drug of tobacco use. Also plan possible heart cath to eval dyspnea. Has upcoming  Sleep study for OSA.  Prediabetes: Improved.  Lab Results  Component Value Date   HGBA1C 6.2 08/23/2016    Gout: No recent flares on allopurinol. Uric acid 6.5  Social History /Family History/Past Medical History reviewed and updated if needed.  ETOH.. 8 beers a day in last several  years. More lately.  Review of Systems  Constitutional: Negative for fever, fatigue and unexpected weight change.  HENT: Negative for ear pain, congestion, sore throat, rhinorrhea, trouble swallowing and postnasal drip.  Eyes: Negative for pain.  Respiratory: Negative for cough, shortness of breath and wheezing.  Cardiovascular: Negative for chest pain, palpitations and leg swelling.  Gastrointestinal: Negative for nausea, abdominal pain, diarrhea, constipation and blood in stool.  Genitourinary: Negative for dysuria, urgency, hematuria, discharge, penile swelling, scrotal swelling, difficulty urinating, penile pain and testicular pain.  Skin: Negative for rash.  Neurological: Negative for syncope, weakness, light-headedness, numbness and headaches.  Psychiatric/Behavioral: Negative for behavioral problems and dysphoric mood. The patient is not nervous/anxious.  Objective:   Physical Exam  Constitutional: He appears well-developed and well-nourished. Non-toxic appearance. He does not appear ill. No distress.  HENT:  Head: Normocephalic and atraumatic.  Right Ear: Hearing, tympanic membrane, external ear and ear canal normal.  Left Ear: Hearing, tympanic membrane, external ear and ear canal normal.  Nose: Nose normal.  Mouth/Throat: Uvula is midline, oropharynx is clear and moist and mucous membranes are normal.  Eyes: Conjunctivae, EOM and lids are normal. Pupils are equal, round, and reactive to light. No foreign bodies found.  Neck: Trachea normal, normal range of motion and phonation normal. Neck supple. Carotid bruit is not present. No mass and no thyromegaly present.  Cardiovascular: Normal rate, regular rhythm, S1 normal, S2 normal, intact distal pulses and normal pulses. Exam reveals no gallop.  No murmur heard.  Pulmonary/Chest: Breath sounds decreased throiughout. He has no wheezes. He  has no rhonchi. He has no rales.  Abdominal: Soft. Normal appearance and  bowel sounds are normal. There is no hepatosplenomegaly. There is no tenderness. There is no rebound, no guarding and no CVA tenderness. No hernia.  Lymphadenopathy:  He has no cervical adenopathy.  Neurological: He is alert. He has normal strength and normal reflexes.tremor severe.. Worse on right, cogwheeling noted Decrease arm swing and slow wide based gait  flat affect  right tremor has now a pill rolling appearance. intention based treomor as well. Skin: Skin is warm, dry and intact. No rash noted.  Psychiatric: He has a normal mood but FLAT affect. His speech is normal and behavior is normal. Judgment and thought content normal. Cognition and memory are normal.  Assessment & Plan:   The patient's preventative maintenance and recommended screening tests for an annual wellness exam were reviewed in full today.  Brought up to date unless services declined.  Counselled on the importance of diet, exercise, and its role in overall health and mortality.  The patient's FH and SH was reviewed, including their home life, tobacco status, and drug and alcohol status.   Colon: 08/2015 NML, REPEAT 5 YEARS. DR. Fleet Contras Vaccines: Td 2011, PNA, flu uptodate PSA: Sees Dr. Jacqlyn Larsen. Has upcoming OV next month. Lab Results  Component Value Date   PSA 0.49 07/14/2015   PSA 0.48 12/20/2013   PSA 0.34 11/24/2012   ASA contraindicated in this pt with overdose in past.

## 2016-08-27 NOTE — Patient Instructions (Addendum)
Decrease alcohol to no more than 2 beers daily.  Work on increasing exercise.  I will let you know about neuro referral. Call if interested in depression/anxiety treatment.

## 2016-08-30 NOTE — Addendum Note (Signed)
Addended by: Eliezer Lofts E on: 08/30/2016 02:06 PM   Modules accepted: Orders

## 2016-09-03 ENCOUNTER — Other Ambulatory Visit: Payer: Self-pay | Admitting: Family Medicine

## 2016-09-04 NOTE — Telephone Encounter (Signed)
Spoke with Hinton Dyer @ [facility] she stated pt saw dr Delice Lesch last and they don't like switching between dr.  I read her your phone note dated 11/7.  She stated she would give this to dr tat to she what she recommened.  Hinton Dyer stated they will call the patient directly to schedule appointment

## 2016-09-05 NOTE — Telephone Encounter (Signed)
See Dr. Amparo Bristol last note... She mentioned in the assessment and plan that she would reocmmend him seeing a movement specialist.

## 2016-09-23 ENCOUNTER — Telehealth: Payer: Self-pay | Admitting: Pulmonary Disease

## 2016-09-23 MED ORDER — PREDNISONE 10 MG PO TABS: ORAL_TABLET | ORAL | 0 refills | Status: DC

## 2016-09-23 NOTE — Telephone Encounter (Signed)
Pt c/o increased prod cough with thick white mucus, sinus congestion, PND, SOB X1 week.   Denies fever, chest pain.    Pt has been taking mucinex, requesting further recs.  Uses [facility].   Sending to DOD as BQ is 11pm elink.  VS please advise.  Thanks!

## 2016-09-23 NOTE — Telephone Encounter (Signed)
Sounds like he has a virus triggering flare of asthma.  Can send script for prednisone 10 mg pill >> 3 pills daily for 2 days, 2 pills daily for 2 days, 1 pill daily for 2 day.  Doesn't sound like he needs antibiotics at this time.  He can also try mucinex prn to help loosen phlegm.  He should call back if his symptoms get worse, but then would likely need an office visit.

## 2016-09-23 NOTE — Telephone Encounter (Signed)
ATC x 1, no answer, no voicemail picked up

## 2016-09-23 NOTE — Telephone Encounter (Signed)
Patient returned phone call.Cody Day ° °

## 2016-09-23 NOTE — Telephone Encounter (Signed)
Spoke with pt, aware of recs.  rx sent to preferred pharmacy.  Nothing further needed.  

## 2016-09-25 ENCOUNTER — Encounter: Payer: Self-pay | Admitting: Neurology

## 2016-09-25 ENCOUNTER — Ambulatory Visit (INDEPENDENT_AMBULATORY_CARE_PROVIDER_SITE_OTHER): Payer: 59 | Admitting: Neurology

## 2016-09-25 VITALS — BP 128/72 | HR 106 | Ht 71.0 in | Wt 220.0 lb

## 2016-09-25 DIAGNOSIS — R251 Tremor, unspecified: Secondary | ICD-10-CM | POA: Diagnosis not present

## 2016-09-25 DIAGNOSIS — R413 Other amnesia: Secondary | ICD-10-CM

## 2016-09-25 NOTE — Progress Notes (Signed)
NEUROLOGY FOLLOW UP OFFICE NOTE  Cody Day [id number]  HISTORY OF PRESENT ILLNESS: I had the pleasure of seeing Cody Day in follow-up in the neurology clinic on Cody Day.  The patient was last seen 2 years ago for memory loss and tremors. He is again accompanied by his wife who helps supplement the history today. He has been dealing with a lot of respiratory problems, however expresses frustration that all the pulmonary tests have come back normal. He is awaiting a cardiac catheterization for possible right-sided cardiac cause of pulmonary symptoms. During his regular visit with his PCP, concern for Parkinson's disease was raised and he presents today for follow-up. His wife reports the tremor has worsened, he uses a Gyenno fork which significantly helps with eating. He has significant difficulty writing, his wife signs their checks. He denies any falls but has gotten close to it, when he stands up his legs become shaky. He tried taking Primidone for a year, initially at 12m qhs, then increased to 581mBID. He did not feel good on the medication and did not notice an improvement in the tremor. There is a strong family history of tremors in his father, younger brother, sister, and his 2551ear old son is starting to "shake a little." Bloodwork done on his visit in 2015 showed normal  TSH, RPR, HIV, B12, heavy metal screen, serum copper, ceruloplasmin, ANA, ESR, CRP, CPK, SPEP/IFE.  He has had memory changes since around 2012, and [facility]  Cognitive Disoder, unspecified; Alcohol abuse, chronic; Adjustment disorder with mixed anxiety and depressed mood; Rule out alcohol dependence. "He demonstrated variable attention and slowed speed of information processing on some measures, the pattern of which could suggest underlying small vessel cerebrovascular disease given his medical history. However, Mr. PoMeneelyneuroimaging is normal. Mr. PoRegerppears to meet criteria for a diagnosis of adjustment disorder with anxiety and depressed mood as well as alcohol abuse at this time. Most likely contributing factors to Mr. PoHammerschmidtognitive difficulties are untreated obstructive sleep apnea, insomnia, chronic alcohol abuse, anxiety and depression, as well as increased risk of cognitive difficulties secondary to prior history of polysubstance abuse.  He demonstrates no specific pattern which would support a diagnosis of dementia at this time and some of his difficulties on current testing are likely related to performance anxiety." His wife states she was unimpressed with the testing and states that "he aced that test because they were asking things from years ago, when his problem is short-term memory." In the interim, she reports he gets confused with a lot of things. He has always repeated himself for many years. His wife needs to remind him about his medications. He denies getting lost driving but does not feel like he is in charge. His wife is in charge of bill payments. He has not worked since September due to pulmonary issues and tremors (he worked seCabin crew He denies any anosmia, no constipation. He feels tired all the time, sleeping one hour at a time due to breathing difficulties despite multiple courses of antibiotics and Prednisone. He denies any neck/back pain, no focal numbness/tingling/weakness.    HPI 07/26/2014: This is a pleasant 60 year old right-handed man with a history of hypertension, hyperlipidemia, COPD, OSA, anxiety, with memory loss and tremors. He and his wife started noticing memory changes around 3 years ago, he had difficulty with short-term memory but could recall events from 20 years  ago. This has been worsening recently, where he would not recall conversations from the day prior. He would tell the same stories repeatedly. He has not gotten lost driving, but does not recall how he got to  a place. He has left the stove on. He occasionally cannot recall if he already took his medication. His wife has always been in charge of the bills. He also has word-finding difficulties where he knows what he wants to say but can't find the word. He has difficulty thinking what he is supposed to do next. He states memory issues have not affected his work.  He has tremors however that have affected his work. 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 The tremors have affecting feeding and writing. He has also noticed that he would misspell words.Sometimes he has tremors throughout his body. He would get unstable when dressing up, he has fallen against the door trying to put his pants on.    He has headaches over the occipital region occurring around once a week, described as "just a pain" that lasts until he takes Ibuprofen or Tylenol. There is no associated nausea, vomiting, phonophobia. He states he is always photosensitive. He has occasional numbness in both hands. He has pain in both feet but denies any paresthesias in the legs. He denies any diplopia, dysarthria, dysphagia, incontinence. No anosmia, constipation, REM behavior disorder. He has back pain and occasional tingling around the left shoulder blade.  He has a history of sleep apnea but has stopped using CPAP after he lost 40 lbs unintentionally over 7 months. He usually gets unrefreshing 5 hours of sleep.  His wife notes weight loss occurred after he changed jobs in November.  She has also noticed that since then, he does not want to seem to do very much or go anywhere. He is just sitting and "everything is blank, no thoughts, no feelings, no nothing."  He became tearful during this time.   There is a family history of tremors in his father, younger brother, sister, and son. His mother had memory problems in her 47s.  He provides additional information about significant drug abuse from age 17 until 19 (stopped in  81). He used LSD, PCP, MDMA, denies intravenous drug intake. He used to drink gin, but since the mid-80s drinks 6-8 beers daily. No change in tremors with alcohol intake.  I personally reviewed MRI brain without contrast which did not show any acute changes.  PAST MEDICAL HISTORY: Past Medical History:  Diagnosis Date  . Achilles tendon rupture 10/11   and repair  . Adenomatous polyp of colon 04/06/2010   Adenomatous polyps 3 in the descending colon, times one in the ascending colon [facility])  . Asthma   . Gout   . Hemorrhoids   . Hypercholesteremia   . Hypertension   . Sleep apnea   . Syncope 10/11   in settin gof asthma exacerbation w coughing. Ech (10/11): EF > 55%, mild LVH, grade I diastolic dysfunction, nomral RV size and systolic function,. normal valves. Carotid US (10/11): minimal disease    MEDICATIONS: Current Outpatient Prescriptions on File Prior to Visit  Medication Sig Dispense Refill  . albuterol (PROVENTIL HFA;VENTOLIN HFA) 108 (90 Base) MCG/ACT inhaler Inhale 2 puffs into the lungs every 6 (six) hours as needed for wheezing or shortness of breath. 1 Inhaler 5  . albuterol (PROVENTIL) (2.5 MG/3ML) 0.083% nebulizer solution Take 3 mLs (2.5 mg total) by nebulization every 4 (four)  hours as needed for wheezing or shortness of breath. 360 mL 11  . allopurinol (ZYLOPRIM) 100 MG tablet TAKE ONE (1) TABLET EACH DAY 30 tablet 5  . B Complex-C (SUPER B COMPLEX PO) Take 1 tablet by mouth daily.    Marland Kitchen lisinopril (PRINIVIL,ZESTRIL) 20 MG tablet TAKE ONE (1) TABLET EACH DAY 30 tablet 5  . montelukast (SINGULAIR) 10 MG tablet Take 1 tablet (10 mg total) by mouth daily. 30 tablet 5  . Multiple Vitamins-Minerals (MULTIVITAL) tablet Take 1 tablet by mouth daily.      . predniSONE (DELTASONE) 10 MG tablet 3 tabs daily X2 days, 2 tabs daily X2 days, 1 tab daily X2 days. 12 tablet 0  . simvastatin (ZOCOR) 20 MG tablet TAKE ONE TABLET EACH EVENING AS DIRECTED 30 tablet 11  .  SYMBICORT 160-4.5 MCG/ACT inhaler USE 2 PUFFS TWICE DAILY 10.2 g 11  . testosterone cypionate (DEPOTESTOTERONE CYPIONATE) 100 MG/ML injection Inject 100 mg into the muscle every 7 (seven) days. On Fridays only uses 1/2 ml  For IM use only    . tiotropium (SPIRIVA) 18 MCG inhalation capsule Place 18 mcg into inhaler and inhale daily.     No current facility-administered medications on file prior to visit.     ALLERGIES: Allergies  Allergen Reactions  . Levaquin [Levofloxacin In D5w]     Severe headaches, skin tingling/redness  . Penicillins     REACTION: (Childhood - bumps, rash)    FAMILY HISTORY: Family History  Problem Relation Age of Onset  . Heart attack Mother 38  . Coronary artery disease Mother   . Hypertension Father   . Cataracts Father   . Diabetes Father   . Hypertension Sister   . Depression Sister   . Hypertension Brother   . Colon cancer Brother   . Diabetes Other     1st degree relative  . Colon cancer Maternal Aunt     SOCIAL HISTORY: Social History   Social History  . Marital status: Married    Spouse name: N/A  . Number of children: Y  . Years of education: N/A   Occupational History  . 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 Smoking status: Former Smoker    Packs/day: 0.25    Years: 35.00    Types: Cigarettes    Quit date: 10/22/1995  . Smokeless tobacco: Current User    Types: Chew     Comment: rarely smoked cigarettes and cigars.  states he smoked drugs mostly  . Alcohol use 0.0 oz/week     Comment: 6 beers daily; used to use cocaine and methamphetamine  . Drug use: No     Comment: former smokerx 27 years  . Sexual activity: Not on file   Other Topics Concern  . Not on file   Social History Narrative   Poor diet, lots of fats. Does not regularly exercise. Married. Originally from [place].     REVIEW OF SYSTEMS: Constitutional: No fevers, chills, or sweats, no generalized fatigue, change in  appetite Eyes: No visual changes, double vision, eye pain Ear, nose and throat: No hearing loss, ear pain, nasal congestion, sore throat Cardiovascular: No chest pain, palpitations Respiratory:  + shortness of breath at rest or with exertion,no wheezes GastrointestinaI: No nausea, vomiting, diarrhea, abdominal pain, fecal incontinence Genitourinary:  No dysuria, urinary retention or frequency Musculoskeletal:  No neck pain, back pain Integumentary: No rash, pruritus, skin lesions Neurological: as above Psychiatric: + depression, insomnia, anxiety  Endocrine: No palpitations, fatigue, diaphoresis, mood swings, change in appetite, change in weight, increased thirst Hematologic/Lymphatic:  No anemia, purpura, petechiae. Allergic/Immunologic: no itchy/runny eyes, nasal congestion, recent allergic reactions, rashes  PHYSICAL EXAM: Vitals:   09/25/16 1307  BP: 128/72  Pulse: (!) 106   General: No acute distress Head:  Normocephalic/atraumatic Neck: supple, no paraspinal tenderness, full range of motion Heart:  Regular rate and rhythm Lungs:  Clear to auscultation bilaterally, appears easily short of breath Back: No paraspinal tenderness Skin/Extremities: No rash, no edema Neurological Exam: alert and oriented to person, place, and time. No aphasia or dysarthria. Fund of knowledge is appropriate.  Recent and remote memory are intact.  Attention and concentration are normal.    Able to name objects and repeat phrases.  Montreal Cognitive Assessment  09/26/2016 07/28/2014  Visuospatial/ Executive (0/5) 4 5  Naming (0/3) 3 3  Attention: Read list of digits (0/2) 2 2  Attention: Read list of letters (0/1) 1 1  Attention: Serial 7 subtraction starting at 100 (0/3) 3 3  Language: Repeat phrase (0/2) 2 2  Language : Fluency (0/1) 1 0  Abstraction (0/2) 2 2  Delayed Recall (0/5) 3 5  Orientation (0/6) 6 6  Total 27 29  Adjusted Score (based on education) - 29   Cranial nerves: CN I: not  tested CN II: pupils equal, round and reactive to light, visual fields intact CN III, IV, VI:  full range of motion, no nystagmus, no ptosis CN V: facial sensation intact CN VII: upper and lower face symmetric CN VIII: hearing intact to finger rub CN IX, X: gag intact, uvula midline CN XI: sternocleidomastoid and trapezius muscles intact CN XII: tongue midline Bulk & Tone: normal on left wrist and both LE, he has significant tremor on the right arm when tested for tone that it appears he has cogwheeling, no fasciculations. Motor: 5/5 throughout with no pronator drift. Difficulty with finger taps bilaterally, good regular foot tapping Sensation: intact to light touch. Romberg test positive (similar to prior) Deep Tendon Reflexes: +1 throughout, no ankle clonus Plantar responses: downgoing bilaterally Cerebellar: no incoordination on finger to nose testing with bilateral endpoint tremor. No dysdiadochokinesia Gait: slow and cautious, wide-based, good arm swing on left, tremor on right hand with ambulation. Unable to tandem walk. + postural instability. Tremor: He has a left hand high amplitude high frequency resting tremor with distraction. There is again pronounced postural and action tremor noted bilaterally. He also has a mild bilateral leg tremor on standing, L>R. +tongue tremor. No micrographia noted with sentence writing or Archimedes spiral on right, unable to do on the left (did not want to attempt).  IMPRESSION: This is a pleasant 60 yo RH man with a history of hypertension, hyperlipidemia, COPD, OSA, who presented 2 years ago for memory loss and worsening tremors. At that time, his tremor was suggestive of essential tremor, there is a strong family history of tremor. His Neuropsychological evaluation indicated Cognitive Disoder, unspecified; Alcohol abuse, chronic; Adjustment disorder with mixed anxiety and depressed mood; Rule out alcohol dependence. Most likely contributing factors to  Mr. Brailsford cognitive difficulties are untreated obstructive sleep apnea, insomnia, chronic alcohol abuse, anxiety and depression, as well as increased risk of cognitive difficulties secondary to prior history of polysubstance abuse.  He demonstrated no specific pattern which would support a diagnosis of dementia at that time and some of his difficulties were likely related to performance anxiety. His wife was unimpressed with the testing and did not  think it accurate. His MOCA score today is 27/30 (29/30 in 2015). We discussed repeating NP testing, but she states she is not as much concerned with the memory as she is his breathing difficulties and the tremor. I discussed the possibility of considering DBS for his tremor and how NP testing is part of this, she is very hesitant. His tremor today is worse, he has features of Essential Tremor, and the severity of his tremor may make him appear to have cogwheeling on the right and a resting tremor on the left, which raised the question of Parkinson's disease. He tried Primidone for a year with no effect, he did not feel well on it. I am hesitant to try beta-blockers with his pulmonary condition. Alcohol intake may also be contributing. He will be referred to our Movement Disorders specialist Dr. Carles Collet for further evaluation and treatment.   Thank you for allowing me to participate in his care.  Please do not hesitate to call for any questions or concerns.  The duration of this appointment visit was 25 minutes of face-to-face time with the patient.  Greater than 50% of this time was spent in counseling, explanation of diagnosis, planning of further management, and coordination of care.   Ellouise Newer, M.D.   CC: Dr. Diona Browner

## 2016-09-25 NOTE — Patient Instructions (Signed)
1. Refer to Dr. Carles Collet for worsening tremor 2. Consider Neuropsychological testing for memory testing 3. Follow-up in 6 months, call for any changes

## 2016-09-26 NOTE — Assessment & Plan Note (Signed)
Severe, very bothersome.  Strong family history suggesting benign familial tremor as previously diagnosed, but now with some elements concerning for parkinson's DO.  Refer back for re-assessment with Dr. Delice Lesch. MAy need further eval with Dr. Carles Collet Movement DO specilist.

## 2016-09-26 NOTE — Assessment & Plan Note (Signed)
At goal on zocor 20 mg daily. Encouraged exercise, weight loss, healthy eating habits.

## 2016-09-26 NOTE — Assessment & Plan Note (Signed)
Counseled on low carb diet. 

## 2016-09-26 NOTE — Assessment & Plan Note (Signed)
Well controlled. Continue current medication.  

## 2016-09-26 NOTE — Assessment & Plan Note (Signed)
Pt not currently interested in med to treat mood. He will contact us if mood worsens.

## 2016-09-26 NOTE — Assessment & Plan Note (Addendum)
Poor control SOB, pt very symptomatic. Followed by pulmonary

## 2016-10-10 ENCOUNTER — Other Ambulatory Visit: Payer: Self-pay

## 2016-10-10 MED ORDER — MONTELUKAST SODIUM 10 MG PO TABS: 10.0000 mg | ORAL_TABLET | Freq: Every day | ORAL | 5 refills | Status: DC

## 2016-10-31 NOTE — Progress Notes (Signed)
Cody Day was seen today in the movement disorders clinic for neurologic consultation at the request of Dr. Delice Lesch.  His primary care physician is Eliezer Lofts, MD.  The consultation is for the evaluation of tremor.  I have reviewed Dr. Amparo Bristol records.  The patient is a 61 year old male who presents today with his wife who supplements the history.     Specific Symptoms:  Tremor: Yes.     How long has it been going on? Teenage years, but mild then  At rest or with activation?  More with activation  When is it noted the most?  Anything physical  Fam hx of tremor?  Yes.   younger brother, sister, son, father  Located where?  Both hands, but the L a bit worse (he is R hand dominant)  Affected by caffeine:  none (doesn't drink any caffeine)  Affected by alcohol:  Yes.  , but only a little (drinks 6-8 beers/day)  Affected by stress:  Yes.    Affected by fatigue:  Yes.    Spills soup if on spoon:  Yes.   (he uses liftware which significantly helps with eating)  Spills glass of liquid if full:  Yes.    Affects ADL's (tying shoes, brushing teeth, etc):  Yes.     Other sx's: Voice: maybe a little less but definitely more tremulous Sleep: trouble staying asleep  Vivid Dreams:  No.  Acting out dreams:  No. Wet Pillows: No. Postural symptoms:  Yes.    Falls?  No. Bradykinesia symptoms: shuffling gait and slow movements Loss of smell:  No. Loss of taste:  No. Urinary Incontinence:  No. Difficulty Swallowing:  No. Handwriting, micrographia: No., just tremulous Trouble with ADL's:  Just slow and puts pants on sitting down Memory changes:  Yes.    He has had memory changes since around 2012, and was evaluated in November 2015 with Neuropsychological evaluation at [facility] by Dr. Leonides Schanz. Impression was  Cognitive Disoder, unspecified; Alcohol abuse, chronic; Adjustment disorder with mixed anxiety and depressed mood; Rule out alcohol dependence. "He demonstrated variable attention and slowed  speed of information processing on some measures, the pattern of which could suggest underlying small vessel cerebrovascular disease given his medical history. However, Mr. Ziel neuroimaging is normal. Mr. Ohler appears to meet criteria for a diagnosis of adjustment disorder with anxiety and depressed mood as well as alcohol abuse at this time. Most likely contributing factors to Mr. Sitzman cognitive difficulties are untreated obstructive sleep apnea, insomnia, chronic alcohol abuse, anxiety and depression, as well as increased risk of cognitive difficulties secondary to prior history of polysubstance abuse. He demonstrates no specific pattern which would support a diagnosis of dementia at this time and some of his difficulties on current testing are likely related to performance anxiety." Hallucinations:  No.  visual distortions: No. N/V:  No., just if coughing a lot from COPD Lightheaded:  No.  Syncope: No. Diplopia:  No. Dyskinesia:  No.  Neuroimaging has  previously been performed.  It is available for my review today.  MRI of the brain was done on 07/01/2014.  It was unremarkable.  PREVIOUS MEDICATIONS: Primidone, thinks that he tried 25 mg twice a day but may have been 50 mg twice a day and did not "feel right" on the medication.  ALLERGIES:   Allergies  Allergen Reactions  . Levaquin [Levofloxacin In D5w]     Severe headaches, skin tingling/redness  . Penicillins     REACTION: (Childhood - bumps,  rash)    CURRENT MEDICATIONS:  Outpatient Encounter Prescriptions as of 11/05/2016  Medication Sig  . albuterol (PROVENTIL HFA;VENTOLIN HFA) 108 (90 Base) MCG/ACT inhaler Inhale 2 puffs into the lungs every 6 (six) hours as needed for wheezing or shortness of breath.  Marland Kitchen albuterol (PROVENTIL) (2.5 MG/3ML) 0.083% nebulizer solution Take 3 mLs (2.5 mg total) by nebulization every 4 (four) hours as needed for wheezing or shortness of breath.  . allopurinol (ZYLOPRIM) 100 MG tablet TAKE ONE (1)  TABLET EACH DAY  . B Complex-C (SUPER B COMPLEX PO) Take 1 tablet by mouth daily.  Marland Kitchen lisinopril (PRINIVIL,ZESTRIL) 20 MG tablet TAKE ONE (1) TABLET EACH DAY  . montelukast (SINGULAIR) 10 MG tablet Take 1 tablet (10 mg total) by mouth daily.  . Multiple Vitamins-Minerals (MULTIVITAL) tablet Take 1 tablet by mouth daily.    . simvastatin (ZOCOR) 20 MG tablet TAKE ONE TABLET EACH EVENING AS DIRECTED  . SYMBICORT 160-4.5 MCG/ACT inhaler USE 2 PUFFS TWICE DAILY  . testosterone cypionate (DEPOTESTOTERONE CYPIONATE) 100 MG/ML injection Inject 100 mg into the muscle every 7 (seven) days. On Fridays only uses 1/2 ml  For IM use only  . tiotropium (SPIRIVA) 18 MCG inhalation capsule Place 18 mcg into inhaler and inhale daily.  . [DISCONTINUED] predniSONE (DELTASONE) 10 MG tablet 3 tabs daily X2 days, 2 tabs daily X2 days, 1 tab daily X2 days.   No facility-administered encounter medications on file as of 11/05/2016.     PAST MEDICAL HISTORY:   Past Medical History:  Diagnosis Date  . Achilles tendon rupture 10/11   and repair  . Adenomatous polyp of colon 04/06/2010   Adenomatous polyps 3 in the descending colon, times one in the ascending colon [facility])  . Asthma   . Gout   . Hemorrhoids   . Hypercholesteremia   . Hypertension   . Sleep apnea    doesn't wear CPAP  . Syncope 10/11   in settin gof asthma exacerbation w coughing. Ech (10/11): EF > 55%, mild LVH, grade I diastolic dysfunction, nomral RV size and systolic function,. normal valves. Carotid US (10/11): minimal disease    PAST SURGICAL HISTORY:   Past Surgical History:  Procedure Laterality Date  . ABDOMINAL HERNIA REPAIR  1998,2000  . calcium kidney stones    . [facility], [name], [place]; Dr. [name]. Multiple adenomatous polyps (total 4).   . COLONOSCOPY WITH PROPOFOL N/A 08/30/2015   Procedure: COLONOSCOPY WITH PROPOFOL;  Surgeon:  [name], MD;  Location: [facility] ENDOSCOPY;  Service: Endoscopy;  Laterality: N/A;  . FEMORAL HERNIA REPAIR  2007  . HEMORRHOID SURGERY    . HERNIA REPAIR  09/10/2010   Repair of recurrent ventral hernia, resection of previously placed mesh x2, implantation 7.5 x 10 cm Gore-Tex dual mesh in an underlay position, repair of epigastric hernia with a 4.2 cm Proceed ventral patch.  Marland Kitchen HERNIA REPAIR   02//28/2007   Laparoscopic right inguinal hernia repair with Surgipro mesh, Ventrilex patch at umbilicus  . HERNIA REPAIR  11/01/1990  .   Small oval Kugel patch placed in preperitoneal space, [name], MD  . HERNIA REPAIR  01/17/1997   Recurrent ventral hernia 15 x 19 Gore-Tex dual mesh placed laparoscopically with multiple lefft--sided ports, [name], MD  . HERNIA REPAIR  01/07/1995    Primary repair of ventral hernia. [name], MD  . intestinal blockage  as a child  . KIDNEY STONE extraction     unic acid stones  . left heart cath  2008   minimal luminal irregularities EF 55%  . treadmill stress test  2003    SOCIAL HISTORY:   Social History   Social History  . Marital status: Married    Spouse name: N/A  . Number of children: Y  . Years of education: N/A   Occupational History  . 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 Smoking status: Former Smoker    Packs/day: 0.25    Years: 35.00    Types: Cigarettes    Quit date: 10/22/1995  . Smokeless tobacco: Current User    Types: Chew     Comment: rarely smoked cigarettes and cigars.  states he smoked drugs mostly  . Alcohol use 0.0 oz/week     Comment: 6 beers daily; used to use cocaine and methamphetamine  . Drug use: No     Comment: former smokerx 27 years  . Sexual activity: Not on file   Other Topics Concern  . Not on file   Social History Narrative   Poor diet, lots of fats. Does not regularly exercise. Married. Originally from [place].     FAMILY HISTORY:   Family  Status  Relation Status  . Father Alive   asbestosis; spots on lungs ?  Marland Kitchen Mother Alive  . Sister Alive  . Brother Alive  . Other   . Maternal Aunt   . Son Alive  . Daughter Alive    ROS:  Chronic SOB due to COPD.  Will have some CP if very SOB.  No lateralizing weakness/paresthesias.  A complete 10 system review of systems was obtained and was unremarkable apart from what is mentioned above.  PHYSICAL EXAMINATION:    VITALS:   Vitals:   11/05/16 0920  BP: (!) 156/86  Pulse: (!) 110  Weight: 221 lb (100.2 kg)  Height: 5\' 11"  (1.803 m)    GEN:  The patient appears stated age and is in NAD. HEENT:  Normocephalic, atraumatic.  The mucous membranes are moist. The superficial temporal arteries are without ropiness or tenderness. CV:  Tachycardic.  Regular rhythm Lungs:  CTAB but has DOE Neck/HEME:  There are no carotid bruits bilaterally.  Neurological examination:  Orientation: The patient is alert and oriented x3. Fund of knowledge is appropriate.  Recent and remote memory are intact.  Attention and concentration are normal.    Able to name objects and repeat phrases. Cranial nerves: There is good facial symmetry. Pupils are equal round and reactive to light bilaterally. Fundoscopic exam reveals clear margins bilaterally. Extraocular muscles are intact. The visual fields are full to confrontational testing. The speech is fluent and clear. Soft palate rises symmetrically and there is no tongue deviation. Hearing is intact to conversational tone. Sensation: Sensation is intact to light and pinprick throughout (facial, trunk, extremities). Vibration is intact at the bilateral big toe. There is no extinction with double simultaneous stimulation. There is no sensory dermatomal level identified. Motor: Strength is 5/5 in the bilateral upper and lower extremities.   Shoulder shrug is equal and symmetric.  There is no pronator drift. Deep tendon reflexes: Deep tendon reflexes are 2/4 at the  bilateral biceps, triceps, brachioradialis, patella and achilles. Plantar responses are downgoing bilaterally.  Movement examination: Tone: There is normal tone in the bilateral upper extremities.  The tone in the lower extremities is normal.  Abnormal movements:  Once relaxed there is no rest tremor but with the most minimal movement he has the development of tremor in the arms and L leg.  It is irregular when at rest.  It is severe with posture and worse with intention.  He has severe trouble with Archimedes spirals and virtually cannot get his hand down on the paper either with right or left hand.  He spills water everywhere when trying to pour it from one glass to another. Coordination:  There is no decremation with RAM's, with any form of RAMS, including alternating supination and pronation of the forearm, hand opening and closing, finger taps, heel taps and toe taps.  He is, however, very slow with all movements in the upper extremities. Gait and Station: The patient has difficulty arising out of a deep-seated chair without the use of the hands. The patient's stride length is decreased, but mostly he is incredibly wide based and somewhat off balance.  Gait is limited by severe dyspnea on exertion.      Chemistry      Component Value Date/Time   NA 138 08/23/2016 0857   NA 136 10/23/2012 1346   K 3.7 08/23/2016 0857   K 3.4 (L) 10/23/2012 1346   CL 103 08/23/2016 0857   CL 99 10/23/2012 1346   CO2 24 08/23/2016 0857   CO2 23 10/23/2012 1346   BUN 6 08/23/2016 0857   BUN 13 10/23/2012 1346   CREATININE 0.77 08/23/2016 0857   CREATININE 1.07 10/23/2012 1346      Component Value Date/Time   CALCIUM 9.2 08/23/2016 0857   CALCIUM 9.3 10/23/2012 1346   ALKPHOS 61 08/23/2016 0857   ALKPHOS 77 10/23/2012 1346   AST 55 (H) 08/23/2016 0857   AST 45 (H) 10/23/2012 1346   ALT 42 08/23/2016 0857   ALT 49 10/23/2012 1346   BILITOT 0.8 08/23/2016 0857   BILITOT 0.9 10/23/2012 1346     Lab  Results  Component Value Date   TSH 1.207 07/26/2014   Lab Results  Component Value Date   VITAMINB12 334 07/26/2014     ASSESSMENT/PLAN:  1. Tremor, likely essential tremor  -Patient has a long-standing history of tremor, ever since his teenage years.  He has a long family history of tremor.  There was no evidence of Parkinson's disease on my examination.  -Explained to the patient that given the degree of tremor, there is likely no medication that will help.  Surgery is likely the only option that would be of significant benefit.  Unfortunately, he has several barriers to undergoing surgery.  The first is lung function.  I have reviewed pulmonary records and pulmonary does think that one of his issues is profound deconditioning.  Even if he is cleared via pulmonary, I told him that I would not do DBS without him significantly decreasing alcohol intake.  He and his wife are not sure that he would like to undergo DBS even if he was a candidate, but he was given information on it.  I will send him a patient DVD in the mail.  -Lab work will be done today including B12, repeat liver enzymes, TSH, copper, ceruloplasmin.  2.  Memory Loss  -evaluated in November 2015 with Neuropsychological evaluation at [facility] by Dr. Leonides Schanz. Impression was  Cognitive Disoder, unspecified; Alcohol abuse, chronic; Adjustment disorder with mixed anxiety and depressed mood; Rule out alcohol dependence. "He demonstrated variable attention and slowed speed of information processing on some measures, the pattern of which could  suggest underlying small vessel cerebrovascular disease given his medical history. However, Mr. Hammann neuroimaging is normal. Mr. Boshers appears to meet criteria for a diagnosis of adjustment disorder with anxiety and depressed mood as well as alcohol abuse at this time. Most likely contributing factors to Mr. Tillis cognitive difficulties are untreated obstructive sleep apnea, insomnia, chronic alcohol  abuse, anxiety and depression, as well as increased risk of cognitive difficulties secondary to prior history of polysubstance abuse. He demonstrates no specific pattern which would support a diagnosis of dementia at this time and some of his difficulties on current testing are likely related to performance anxiety."  -explained that he would need to undergo repeat neuropsych testing to be considered for DBS and that uncontrolled anxiety and depression are contraindications to DBS.    3.  He and his wife will let me know if he would like to follow up in regards to tremor.  Much greater than 50% of this 60 minute visit was spent in counseling with the patient and his wife.  This did not include the 35 minutes I spent in record review prior to his visit.  Cc:  Eliezer Lofts, MD

## 2016-11-05 ENCOUNTER — Ambulatory Visit (INDEPENDENT_AMBULATORY_CARE_PROVIDER_SITE_OTHER): Payer: 59 | Admitting: Neurology

## 2016-11-05 ENCOUNTER — Other Ambulatory Visit: Payer: 59

## 2016-11-05 ENCOUNTER — Encounter: Payer: Self-pay | Admitting: Neurology

## 2016-11-05 ENCOUNTER — Ambulatory Visit: Payer: 59 | Admitting: Pulmonary Disease

## 2016-11-05 VITALS — BP 156/86 | HR 110 | Ht 71.0 in | Wt 221.0 lb

## 2016-11-05 DIAGNOSIS — E538 Deficiency of other specified B group vitamins: Secondary | ICD-10-CM | POA: Diagnosis not present

## 2016-11-05 DIAGNOSIS — R251 Tremor, unspecified: Secondary | ICD-10-CM

## 2016-11-05 DIAGNOSIS — Z789 Other specified health status: Secondary | ICD-10-CM | POA: Diagnosis not present

## 2016-11-05 DIAGNOSIS — R740 Nonspecific elevation of levels of transaminase and lactic acid dehydrogenase [LDH]: Secondary | ICD-10-CM

## 2016-11-05 DIAGNOSIS — R7401 Elevation of levels of liver transaminase levels: Secondary | ICD-10-CM

## 2016-11-05 DIAGNOSIS — R0602 Shortness of breath: Secondary | ICD-10-CM

## 2016-11-05 DIAGNOSIS — Z7289 Other problems related to lifestyle: Secondary | ICD-10-CM

## 2016-11-05 LAB — HEPATIC FUNCTION PANEL
ALT: 44 U/L (ref 9–46)
AST: 74 U/L — ABNORMAL HIGH (ref 10–35)
Albumin: 3.8 g/dL (ref 3.6–5.1)
Alkaline Phosphatase: 65 U/L (ref 40–115)
Bilirubin, Direct: 0.2 mg/dL (ref <=0.2)
Indirect Bilirubin: 0.5 mg/dL (ref 0.2–1.2)
Total Bilirubin: 0.7 mg/dL (ref 0.2–1.2)
Total Protein: 6.5 g/dL (ref 6.1–8.1)

## 2016-11-05 LAB — TSH: TSH: 1.6 mIU/L (ref 0.40–4.50)

## 2016-11-05 NOTE — Patient Instructions (Addendum)
1. Your provider has requested that you have labwork completed today. Please go to [facility] ([id number]) on the second floor of this building before leaving the office today. You do not need to check in. If you are not called within 15 minutes please check with the front desk.   2. We will mail you a disc of DBS education video. Let us know if this is something you would like to pursue.

## 2016-11-06 LAB — VITAMIN B12: Vitamin B-12: 443 pg/mL (ref 200–1100)

## 2016-11-07 LAB — CERULOPLASMIN: Ceruloplasmin: 19 mg/dL (ref 18–36)

## 2016-11-09 LAB — COPPER, SERUM: Copper: 68 ug/dL — ABNORMAL LOW (ref 70–175)

## 2016-11-11 ENCOUNTER — Telehealth: Payer: Self-pay | Admitting: Neurology

## 2016-11-11 NOTE — Telephone Encounter (Signed)
-----   Message from [place], DO sent at 11/10/2016  4:07 PM EST ----- With the exception of continued elevation of liver enzymes (likely from EtOH) labs are okay.  You can let pt know.  He and I already discussed weaning EtOH

## 2016-11-11 NOTE — Telephone Encounter (Signed)
Patient made aware of results.  

## 2016-11-26 ENCOUNTER — Telehealth: Payer: Self-pay | Admitting: Neurology

## 2016-11-26 NOTE — Telephone Encounter (Signed)
Left message on machine for patient to call back.

## 2016-11-26 NOTE — Telephone Encounter (Signed)
Patient wants to check on the DBS video that was suppose to be mailed to him. Please call [phone]

## 2016-11-26 NOTE — Telephone Encounter (Signed)
Patient aware they are no longer making video discs- all information is on the website. [employer] Rep is trying to find old extras but has not been successful yet. I gave him website information and he will call with any problems.

## 2016-12-11 ENCOUNTER — Telehealth: Payer: Self-pay | Admitting: Pulmonary Disease

## 2016-12-11 DIAGNOSIS — R0602 Shortness of breath: Secondary | ICD-10-CM

## 2016-12-11 DIAGNOSIS — G4733 Obstructive sleep apnea (adult) (pediatric): Secondary | ICD-10-CM

## 2016-12-11 NOTE — Telephone Encounter (Signed)
Orders placed for this pt for the cpap titration and the Referral to cardiology.  Will forward to [facility] to follow up on orders. thanks

## 2016-12-11 NOTE — Telephone Encounter (Signed)
1) we need to appeal the CPAP titratoin 2) I thought the [place] was going to happen at [facility], if not then he needs a referral to [facility] or [facility] for a RHC

## 2016-12-11 NOTE — Telephone Encounter (Signed)
Spoke with pt. At his last OV, BQ wanted him to have a CPAP titration study. At the time his insurance company did not want to cover this due to it not being medically necessary. Pt also mentioned that BQ wanted him to have a heart catheterization. He would like to get both of these tests scheduled. Pt was to follow up with BQ in January but the pt canceled his appointment. Advised pt that we should reschedule his appointment with BQ but he declined, he's only worried about having these tests done.  BQ - please advise. Thanks.

## 2016-12-12 NOTE — Telephone Encounter (Signed)
Spoke with pt. He is aware that we will be ordering these tests for him. Nothing further was needed.

## 2016-12-30 ENCOUNTER — Ambulatory Visit (HOSPITAL_COMMUNITY): Payer: 59 | Admitting: Internal Medicine

## 2016-12-31 ENCOUNTER — Telehealth: Payer: Self-pay | Admitting: Pulmonary Disease

## 2016-12-31 NOTE — Telephone Encounter (Signed)
Spoke with pt. Advised him that he would need to contact Cardiology directly to reschedule his appointment. The number for Cardiology has been given to the pt. Nothing further was needed.

## 2017-01-09 ENCOUNTER — Ambulatory Visit (HOSPITAL_COMMUNITY)
Admission: RE | Admit: 2017-01-09 | Discharge: 2017-01-09 | Disposition: A | Discharge status: Home / Self Care | Payer: 59 | Source: Ambulatory Visit | Attending: Internal Medicine | Admitting: Internal Medicine

## 2017-01-09 ENCOUNTER — Encounter (HOSPITAL_COMMUNITY): Payer: Self-pay | Admitting: Internal Medicine

## 2017-01-09 ENCOUNTER — Telehealth: Payer: Self-pay | Admitting: *Deleted

## 2017-01-09 VITALS — BP 153/96 | HR 122 | Wt 220.0 lb

## 2017-01-09 DIAGNOSIS — I10 Essential (primary) hypertension: Secondary | ICD-10-CM | POA: Insufficient documentation

## 2017-01-09 DIAGNOSIS — Z881 Allergy status to other antibiotic agents status: Secondary | ICD-10-CM | POA: Insufficient documentation

## 2017-01-09 DIAGNOSIS — R079 Chest pain, unspecified: Secondary | ICD-10-CM | POA: Diagnosis present

## 2017-01-09 DIAGNOSIS — Z880 Allergy status to penicillin: Secondary | ICD-10-CM | POA: Diagnosis not present

## 2017-01-09 DIAGNOSIS — Z800 Family history of malignant neoplasm of digestive organs: Secondary | ICD-10-CM | POA: Insufficient documentation

## 2017-01-09 DIAGNOSIS — J449 Chronic obstructive pulmonary disease, unspecified: Secondary | ICD-10-CM | POA: Diagnosis not present

## 2017-01-09 DIAGNOSIS — G4733 Obstructive sleep apnea (adult) (pediatric): Secondary | ICD-10-CM | POA: Insufficient documentation

## 2017-01-09 DIAGNOSIS — Z8249 Family history of ischemic heart disease and other diseases of the circulatory system: Secondary | ICD-10-CM | POA: Insufficient documentation

## 2017-01-09 DIAGNOSIS — Z01810 Encounter for preprocedural cardiovascular examination: Secondary | ICD-10-CM

## 2017-01-09 DIAGNOSIS — R0600 Dyspnea, unspecified: Secondary | ICD-10-CM | POA: Insufficient documentation

## 2017-01-09 DIAGNOSIS — F419 Anxiety disorder, unspecified: Secondary | ICD-10-CM | POA: Insufficient documentation

## 2017-01-09 DIAGNOSIS — Z833 Family history of diabetes mellitus: Secondary | ICD-10-CM | POA: Insufficient documentation

## 2017-01-09 DIAGNOSIS — Z7951 Long term (current) use of inhaled steroids: Secondary | ICD-10-CM | POA: Insufficient documentation

## 2017-01-09 DIAGNOSIS — Z79899 Other long term (current) drug therapy: Secondary | ICD-10-CM | POA: Diagnosis not present

## 2017-01-09 DIAGNOSIS — G250 Essential tremor: Secondary | ICD-10-CM | POA: Insufficient documentation

## 2017-01-09 DIAGNOSIS — Z818 Family history of other mental and behavioral disorders: Secondary | ICD-10-CM | POA: Insufficient documentation

## 2017-01-09 DIAGNOSIS — E785 Hyperlipidemia, unspecified: Secondary | ICD-10-CM | POA: Insufficient documentation

## 2017-01-09 DIAGNOSIS — F17220 Nicotine dependence, chewing tobacco, uncomplicated: Secondary | ICD-10-CM | POA: Diagnosis not present

## 2017-01-09 DIAGNOSIS — R0602 Shortness of breath: Secondary | ICD-10-CM

## 2017-01-09 NOTE — Telephone Encounter (Signed)
Asked by Dr Fletcher Anon to arrange Right and Left heart catheterization for patient on Monday, 01/13/17.  Cath scheduled for 01/13/17 at 1030, arrive at 0930. Patient notified and given instrcutions to go to [place] today or tomorrow for lab work and chest x-ray. Additional instructions read to patient and friend (as patient stated he could not write information down because of his tremors) over the phone as follows: [facility] Cardiac Cath Instructions   You are scheduled for a Cardiac Cath on:__3/26/18_____________  Please arrive at _09:30_am on the day of your procedure  Please expect a call from our [place] to pre-register you  Do not eat/drink anything after midnight  Someone will need to drive you home  It is recommended someone be with you for the first 24 hours after your procedure  Wear clothes that are easy to get on/off and wear slip on shoes if possible   Medications bring a current list of all medications with you  XX_ You may take all of your medications the morning of your procedure with enough water to swallow safely  Day of your procedure: Arrive at the [facility] entrance.  Free valet service is available.  After entering the [place] please check-in at the registration desk (1st desk on your right) to receive your armband. After receiving your armband someone will escort you to the cardiac cath/special procedures waiting area.  The usual length of stay after your procedure is about 2 to 3 hours.  This can vary.    Patient and friend on the phone verbalized understanding of instructions. Orders for CXR and labs entered.

## 2017-01-09 NOTE — Progress Notes (Signed)
PCP: Diona Browner Pulm: [facility]  ADVANCED HF CONSULT NOTE  HPI:  Cody Day is a 61 y/o HTN, HL, COPD, OSA, asthma, anxiety, essential tremor who is referred by Dr. Diona Browner for further evaluation of CP/SOB.  Denies any h/o known cardiac disease. Had cardiac cath in 2008 with minimal luminal irregularities. He underwent a nuclear stress test in June 2016 due to ongoing dyspnea which showed no significant EKG changes with exercise. There was no significant perfusion defects but ejection fraction was calculated to be moderately reduced between 30-44%.He subsequently saw Dr. Fletcher Anon an underwent an echocardiogram with ejection fraction was around 60-65% in 7/16.  Says he used to smoke but stopped some time ago. Now uses smokeless tobacco.   He suffers from asthma with episodes of anxiety. He has had full pulmonary w/u at [facility] and with Dr. Lake Bells here with no clear answers. They have suggested R/L cath to further evaluate.   He describes substernal tightness and SOB with any activity. Unable to do even his ADLs without symptoms. Feels like symptoms are definitely getting worse. Has been out of work completely since September 2017. Has been out of his inhalers for last month. Denies bleeding. No ab pain. No blood in stool.     Review of Systems:     Cardiac Review of Systems: {Y] = yes [ ]  = no  Chest Pain Blue.Reese    ]  Resting SOB [ y  ] Exertional SOB  [ y ]  Orthopnea [  ]   Pedal Edema [   ]    Palpitations [  ] Syncope  [  ]   Presyncope [   ]  General Review of Systems: [Y] = yes [  ]=no Constitional: recent weight change [  ]; anorexia [  ]; fatigue Blue.Reese  ]; nausea [  ]; night sweats [  ]; fever [  ]; or chills [  ];                                                                                                                                          Dental: poor dentition[  ];   Eye : blurred vision [  ]; diplopia [   ]; vision changes [  ];  Amaurosis fugax[  ]; Resp: cough Blue.Reese  ];  wheezing[y  ];   hemoptysis[  ]; shortness of breath[y  ]; paroxysmal nocturnal dyspnea[  ]; dyspnea on exertion[ y ]; or orthopnea[  ];  GI:  gallstones[  ], vomiting[  ];  dysphagia[  ]; melena[  ];  hematochezia [  ]; heartburn[  ];   Hx of  Colonoscopy[  ]; GU: kidney stones [  ]; hematuria[  ];   dysuria [  ];  nocturia[  ];  history of     obstruction [  ];  Skin: rash, swelling[  ];, hair loss[  ];  peripheral edema[  ];  or itching[  ]; Musculosketetal: myalgias[  ];  joint swelling[  ];  joint erythema[  ];  joint pain[ y ];  back pain[y  ];  Heme/Lymph: bruising[  ];  bleeding[  ];  anemia[  ];  Neuro: TIA[  ];  headaches[  ];  stroke[  ];  vertigo[  ];  seizures[  ];   paresthesias[  ];  difficulty walking[  ];  Psych:depression[  ]; Cody Day  ];  Endocrine: diabetes[  ];  thyroid dysfunction[  ];  Immunizations: Flu [  ]; Pneumococcal[  ];  Other:    Past Medical History:  Diagnosis Date  . Achilles tendon rupture 10/11   and repair  . Adenomatous polyp of colon 04/06/2010   Adenomatous polyps 3 in the descending colon, times one in the ascending colon [facility])  . Asthma   . Gout   . Hemorrhoids   . Hypercholesteremia   . Hypertension   . Sleep apnea    doesn't wear CPAP  . Syncope 10/11   in settin gof asthma exacerbation w coughing. Ech (10/11): EF > 55%, mild LVH, grade I diastolic dysfunction, nomral RV size and systolic function,. normal valves. Carotid US (10/11): minimal disease    Current Outpatient Prescriptions  Medication Sig Dispense Refill  . albuterol (PROVENTIL HFA;VENTOLIN HFA) 108 (90 Base) MCG/ACT inhaler Inhale 2 puffs into the lungs every 6 (six) hours as needed for wheezing or shortness of breath. 1 Inhaler 5  . albuterol (PROVENTIL) (2.5 MG/3ML) 0.083% nebulizer solution Take 3 mLs (2.5 mg total) by nebulization every 4 (four) hours as needed for wheezing or shortness of breath. 360 mL 11  . allopurinol (ZYLOPRIM) 100 MG tablet TAKE ONE (1)  TABLET EACH DAY 30 tablet 5  . B Complex-C (SUPER B COMPLEX PO) Take 1 tablet by mouth daily.    Marland Kitchen lisinopril (PRINIVIL,ZESTRIL) 20 MG tablet TAKE ONE (1) TABLET EACH DAY 30 tablet 5  . montelukast (SINGULAIR) 10 MG tablet Take 1 tablet (10 mg total) by mouth daily. 30 tablet 5  . Multiple Vitamins-Minerals (MULTIVITAL) tablet Take 1 tablet by mouth daily.      . simvastatin (ZOCOR) 20 MG tablet TAKE ONE TABLET EACH EVENING AS DIRECTED 30 tablet 11  . SYMBICORT 160-4.5 MCG/ACT inhaler USE 2 PUFFS TWICE DAILY 10.2 g 11  . testosterone cypionate (DEPOTESTOTERONE CYPIONATE) 100 MG/ML injection Inject 100 mg into the muscle every 7 (seven) days. On Fridays only uses 1/2 ml  For IM use only    . tiotropium (SPIRIVA) 18 MCG inhalation capsule Place 18 mcg into inhaler and inhale daily.     No current facility-administered medications for this encounter.      Allergies  Allergen Reactions  . Levaquin [Levofloxacin In D5w]     Severe headaches, skin tingling/redness  . Penicillins     REACTION: (Childhood - bumps, rash)    Social History   Social History  . Marital status: Married    Spouse name: N/A  . Number of children: Y  . Years of education: N/A   Occupational History  . 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 Smoking status: Former Smoker    Packs/day: 0.25    Years: 35.00    Types: Cigarettes    Quit date: 10/22/1995  . Smokeless tobacco: Current User    Types: Chew     Comment:  rarely smoked cigarettes and cigars.  states he smoked drugs mostly  . Alcohol use 0.0 oz/week     Comment: 6 beers daily; used to use cocaine and methamphetamine  . Drug use: No     Comment: former smokerx 27 years  . Sexual activity: Not on file   Other Topics Concern  . Not on file   Social History Narrative   Poor diet, lots of fats. Does not regularly exercise. Married. Originally from [place].     Family History  Problem Relation Age of Onset    . Hypertension Father   . Cataracts Father   . Diabetes Father   . Heart attack Mother 63  . Coronary artery disease Mother   . Dementia Mother   . Hypertension Sister   . Depression Sister   . Hypertension Brother   . Colon cancer Brother   . Diabetes Other     1st degree relative  . Colon cancer Maternal Aunt     PHYSICAL EXAM: Vitals:   01/09/17 1144  BP: (!) 153/96  Pulse: (!) 122   General:  Very tremulous and SOB of breath at rest Flushed  HEENT: normal Neck: supple. no JVD. Carotids 2+ bilat; no bruits. No lymphadenopathy or thryomegaly appreciated. Cor: PMI nondisplaced. Tachy regular. No murmurs Lungs: clear with decreased air movment Abdomen: obese soft, nontender, nondistended. No hepatosplenomegaly. No bruits or masses. Good bowel sounds. Extremities: no cyanosis, clubbing, rash, edema Neuro: alert & oriented x 3, diffuse severe tremor   ECG: sinustach 119 No ST-T wave abnormalities.    No results found for this or any previous visit (from the past 24 hour(s)). No results found.   ASSESSMENT & PLAN: 1. Progressive severe dyspnea 2. Chest pain 3. HTN 4. HL 5. COPD   I have reviewed records from Dr. Lake Bells as well as [facility] work-up in [place].   Echo images also reviewed personally.   Given severe dyspnea and chest pain without clear etiology; I agree that he needs right and left heart catheterization to further evaluate. I have reviewed the risks, indications, and alternatives to angioplasty and stenting with the patient. Risks include but are not limited to bleeding, infection, vascular injury, stroke, myocardial infection, arrhythmia, kidney injury, radiation-related injury in the case of prolonged fluoroscopy use, emergency cardiac surgery, and death. The patient understands the risks of serious complication is low (<5%) and he agrees to proceed.   Mr. Boger prefers to have cath in [place] near his home. I called Dr. Fletcher Anon and discussed with  him and agrees to perform cath on Monday.   We will draw pre-cath labs today.   I share concerns that anxiety may also be playing a major role here. Will await results of cath to assist in further decision making.   Glori Bickers, MD

## 2017-01-10 ENCOUNTER — Other Ambulatory Visit
Admission: RE | Admit: 2017-01-10 | Discharge: 2017-01-10 | Disposition: A | Discharge status: Home / Self Care | Payer: 59 | Source: Ambulatory Visit | Attending: Cardiovascular Disease | Admitting: Cardiovascular Disease

## 2017-01-10 ENCOUNTER — Ambulatory Visit
Admission: RE | Admit: 2017-01-10 | Discharge: 2017-01-10 | Disposition: A | Discharge status: Home / Self Care | Payer: 59 | Source: Ambulatory Visit | Attending: Cardiovascular Disease | Admitting: Cardiovascular Disease

## 2017-01-10 DIAGNOSIS — Z01810 Encounter for preprocedural cardiovascular examination: Secondary | ICD-10-CM

## 2017-01-10 DIAGNOSIS — R0602 Shortness of breath: Secondary | ICD-10-CM | POA: Diagnosis not present

## 2017-01-10 LAB — BASIC METABOLIC PANEL
Anion gap: 14 (ref 5–15)
BUN: <5 mg/dL — ABNORMAL LOW (ref 6–20)
CO2: 18 mmol/L — ABNORMAL LOW (ref 22–32)
Calcium: 9.2 mg/dL (ref 8.9–10.3)
Chloride: 98 mmol/L — ABNORMAL LOW (ref 101–111)
Creatinine, Ser: 0.84 mg/dL (ref 0.61–1.24)
GFR calc Af Amer: >60 mL/min (ref >60)
GFR calc non Af Amer: >60 mL/min (ref >60)
Glucose, Bld: 139 mg/dL — ABNORMAL HIGH (ref 65–99)
Potassium: 3.5 mmol/L (ref 3.5–5.1)
Sodium: 130 mmol/L — ABNORMAL LOW (ref 135–145)

## 2017-01-10 LAB — CBC WITH DIFFERENTIAL/PLATELET
Basophils Absolute: 0.1 10*3/uL (ref 0–0.1)
Basophils Relative: 1 %
Eosinophils Absolute: 0 10*3/uL (ref 0–0.7)
Eosinophils Relative: 1 %
HCT: 50 % (ref 40.0–52.0)
Hemoglobin: 17.3 g/dL (ref 13.0–18.0)
Lymphocytes Relative: 21 %
Lymphs Abs: 1.4 10*3/uL (ref 1.0–3.6)
MCH: 30.4 pg (ref 26.0–34.0)
MCHC: 34.6 g/dL (ref 32.0–36.0)
MCV: 87.9 fL (ref 80.0–100.0)
Monocytes Absolute: 1 10*3/uL (ref 0.2–1.0)
Monocytes Relative: 15 %
Neutro Abs: 4.2 10*3/uL (ref 1.4–6.5)
Neutrophils Relative %: 62 %
Platelets: 268 10*3/uL (ref 150–440)
RBC: 5.68 MIL/uL (ref 4.40–5.90)
RDW: 15 % — ABNORMAL HIGH (ref 11.5–14.5)
WBC: 6.6 10*3/uL (ref 3.8–10.6)

## 2017-01-10 LAB — PROTIME-INR
INR: 0.92
Prothrombin Time: 12.3 seconds (ref 11.4–15.2)

## 2017-01-13 ENCOUNTER — Encounter
Admission: RE | Disposition: A | Discharge status: Home / Self Care | Payer: Self-pay | Source: Ambulatory Visit | Attending: Cardiovascular Disease

## 2017-01-13 ENCOUNTER — Encounter: Payer: Self-pay | Admitting: Cardiovascular Disease

## 2017-01-13 ENCOUNTER — Ambulatory Visit
Admission: RE | Admit: 2017-01-13 | Discharge: 2017-01-13 | Disposition: A | Discharge status: Home / Self Care | Payer: 59 | Source: Ambulatory Visit | Attending: Cardiovascular Disease | Admitting: Cardiovascular Disease

## 2017-01-13 DIAGNOSIS — G250 Essential tremor: Secondary | ICD-10-CM | POA: Diagnosis not present

## 2017-01-13 DIAGNOSIS — G4733 Obstructive sleep apnea (adult) (pediatric): Secondary | ICD-10-CM | POA: Insufficient documentation

## 2017-01-13 DIAGNOSIS — R0602 Shortness of breath: Secondary | ICD-10-CM | POA: Insufficient documentation

## 2017-01-13 DIAGNOSIS — F419 Anxiety disorder, unspecified: Secondary | ICD-10-CM | POA: Diagnosis not present

## 2017-01-13 DIAGNOSIS — Z880 Allergy status to penicillin: Secondary | ICD-10-CM | POA: Insufficient documentation

## 2017-01-13 DIAGNOSIS — E785 Hyperlipidemia, unspecified: Secondary | ICD-10-CM | POA: Insufficient documentation

## 2017-01-13 DIAGNOSIS — E7800 Pure hypercholesterolemia, unspecified: Secondary | ICD-10-CM | POA: Diagnosis not present

## 2017-01-13 DIAGNOSIS — Z7951 Long term (current) use of inhaled steroids: Secondary | ICD-10-CM | POA: Diagnosis not present

## 2017-01-13 DIAGNOSIS — J449 Chronic obstructive pulmonary disease, unspecified: Secondary | ICD-10-CM | POA: Insufficient documentation

## 2017-01-13 DIAGNOSIS — R0789 Other chest pain: Secondary | ICD-10-CM | POA: Insufficient documentation

## 2017-01-13 DIAGNOSIS — Z79899 Other long term (current) drug therapy: Secondary | ICD-10-CM | POA: Diagnosis not present

## 2017-01-13 DIAGNOSIS — Z87891 Personal history of nicotine dependence: Secondary | ICD-10-CM | POA: Insufficient documentation

## 2017-01-13 DIAGNOSIS — I10 Essential (primary) hypertension: Secondary | ICD-10-CM | POA: Diagnosis not present

## 2017-01-13 DIAGNOSIS — I2510 Atherosclerotic heart disease of native coronary artery without angina pectoris: Secondary | ICD-10-CM | POA: Insufficient documentation

## 2017-01-13 DIAGNOSIS — F17290 Nicotine dependence, other tobacco product, uncomplicated: Secondary | ICD-10-CM | POA: Insufficient documentation

## 2017-01-13 HISTORY — PX: RIGHT/LEFT HEART CATH AND CORONARY ANGIOGRAPHY: CATH118266

## 2017-01-13 SURGERY — RIGHT/LEFT HEART CATH AND CORONARY ANGIOGRAPHY
Anesthesia: Moderate Sedation

## 2017-01-13 MED ORDER — HEPARIN SODIUM (PORCINE) 1000 UNIT/ML IJ SOLN
INTRAMUSCULAR | Status: DC | PRN
  Administered 2017-01-13: 4000 [IU] via INTRAVENOUS

## 2017-01-13 MED ORDER — LIDOCAINE HCL (PF) 1 % IJ SOLN
INTRAMUSCULAR | Status: DC | PRN
  Administered 2017-01-13 (×2): 2 mL via INTRADERMAL

## 2017-01-13 MED ORDER — HEPARIN SODIUM (PORCINE) 1000 UNIT/ML IJ SOLN
INTRAMUSCULAR | Status: AC
Start: 2017-01-13 — End: 2017-01-13
  Filled 2017-01-13: qty 1

## 2017-01-13 MED ORDER — ASPIRIN 81 MG PO CHEW
CHEWABLE_TABLET | ORAL | Status: AC
  Filled 2017-01-13: qty 1

## 2017-01-13 MED ORDER — SODIUM CHLORIDE 0.9 % WEIGHT BASED INFUSION: 1.0000 mL/kg/h | INTRAVENOUS | Status: DC

## 2017-01-13 MED ORDER — SODIUM CHLORIDE 0.9 % IV SOLN: 250.0000 mL | INTRAVENOUS | Status: DC | PRN

## 2017-01-13 MED ORDER — SODIUM CHLORIDE 0.9% FLUSH: 3.0000 mL | Freq: Two times a day (BID) | INTRAVENOUS | Status: DC

## 2017-01-13 MED ORDER — CLOPIDOGREL BISULFATE 300 MG PO TABS
ORAL_TABLET | ORAL | Status: AC
  Filled 2017-01-13: qty 2

## 2017-01-13 MED ORDER — ASPIRIN 81 MG PO CHEW
81.0000 mg | CHEWABLE_TABLET | ORAL | Status: AC
  Administered 2017-01-13: 81 mg via ORAL

## 2017-01-13 MED ORDER — SODIUM CHLORIDE 0.9% FLUSH: 3.0000 mL | INTRAVENOUS | Status: DC | PRN

## 2017-01-13 MED ORDER — VERAPAMIL HCL 2.5 MG/ML IV SOLN
INTRAVENOUS | Status: DC | PRN
  Administered 2017-01-13: 2.5 mg via INTRA_ARTERIAL

## 2017-01-13 MED ORDER — FENTANYL CITRATE (PF) 100 MCG/2ML IJ SOLN
INTRAMUSCULAR | Status: DC | PRN
  Administered 2017-01-13: 25 ug via INTRAVENOUS

## 2017-01-13 MED ORDER — MIDAZOLAM HCL 2 MG/2ML IJ SOLN
INTRAMUSCULAR | Status: AC
  Filled 2017-01-13: qty 2

## 2017-01-13 MED ORDER — LIDOCAINE HCL (PF) 1 % IJ SOLN
INTRAMUSCULAR | Status: AC
  Filled 2017-01-13: qty 30

## 2017-01-13 MED ORDER — VERAPAMIL HCL 2.5 MG/ML IV SOLN
INTRAVENOUS | Status: AC
  Filled 2017-01-13: qty 2

## 2017-01-13 MED ORDER — SODIUM CHLORIDE 0.9 % WEIGHT BASED INFUSION
3.0000 mL/kg/h | INTRAVENOUS | Status: DC
  Administered 2017-01-13: 3 mL/kg/h via INTRAVENOUS

## 2017-01-13 MED ORDER — FENTANYL CITRATE (PF) 100 MCG/2ML IJ SOLN
INTRAMUSCULAR | Status: AC
  Filled 2017-01-13: qty 2

## 2017-01-13 MED ORDER — MIDAZOLAM HCL 2 MG/2ML IJ SOLN
INTRAMUSCULAR | Status: DC | PRN
  Administered 2017-01-13: 1 mg via INTRAVENOUS

## 2017-01-13 MED ORDER — HEPARIN (PORCINE) IN NACL 2-0.9 UNIT/ML-% IJ SOLN
INTRAMUSCULAR | Status: AC
  Filled 2017-01-13: qty 500

## 2017-01-13 SURGICAL SUPPLY — 9 items
CATH BALLN WEDGE 5F 110CM (CATHETERS) ×1 IMPLANT
CATH INFINITI 5FR ANG PIGTAIL (CATHETERS) ×1 IMPLANT
CATH OPTITORQUE JACKY 4.0 5F (CATHETERS) ×1 IMPLANT
DEVICE RAD TR BAND REGULAR (VASCULAR PRODUCTS) ×1 IMPLANT
GLIDESHEATH SLEND SS 6F .021 (SHEATH) ×2 IMPLANT
KIT MANI 3VAL PERCEP (MISCELLANEOUS) ×2 IMPLANT
KIT RIGHT HEART (MISCELLANEOUS) ×1 IMPLANT
PACK CARDIAC CATH (CUSTOM PROCEDURE TRAY) ×2 IMPLANT
WIRE ROSEN-J .035X260CM (WIRE) ×1 IMPLANT

## 2017-01-13 NOTE — Interval H&P Note (Signed)
History and Physical Interval Note:  01/13/2017 11:31 AM  Cody Day  has presented today for surgery, with the diagnosis of Bi-lateral Heart Cath  The various methods of treatment have been discussed with the patient and family. After consideration of risks, benefits and other options for treatment, the patient has consented to  Procedure(s): Right/Left Heart Cath and Coronary Angiography (N/A) as a surgical intervention .  The patient's history has been reviewed, patient examined, no change in status, stable for surgery.  I have reviewed the patient's chart and labs.  Questions were answered to the patient's satisfaction.     Kathlyn Sacramento

## 2017-01-13 NOTE — H&P (View-Only) (Signed)
PCP: Diona Browner Pulm: [facility]  ADVANCED HF CONSULT NOTE  HPI:  Cody Day is a 61 y/o HTN, HL, COPD, OSA, asthma, anxiety, essential tremor who is referred by Dr. Diona Browner for further evaluation of CP/SOB.  Denies any h/o known cardiac disease. Had cardiac cath in 2008 with minimal luminal irregularities. He underwent a nuclear stress test in June 2016 due to ongoing dyspnea which showed no significant EKG changes with exercise. There was no significant perfusion defects but ejection fraction was calculated to be moderately reduced between 30-44%.He subsequently saw Dr. Fletcher Anon an underwent an echocardiogram with ejection fraction was around 60-65% in 7/16.  Says he used to smoke but stopped some time ago. Now uses smokeless tobacco.   He suffers from asthma with episodes of anxiety. He has had full pulmonary w/u at [facility] and with Dr. Lake Bells here with no clear answers. They have suggested R/L cath to further evaluate.   He describes substernal tightness and SOB with any activity. Unable to do even his ADLs without symptoms. Feels like symptoms are definitely getting worse. Has been out of work completely since September 2017. Has been out of his inhalers for last month. Denies bleeding. No ab pain. No blood in stool.     Review of Systems:     Cardiac Review of Systems: {Y] = yes [ ]  = no  Chest Pain Blue.Reese    ]  Resting SOB [ y  ] Exertional SOB  [ y ]  Orthopnea [  ]   Pedal Edema [   ]    Palpitations [  ] Syncope  [  ]   Presyncope [   ]  General Review of Systems: [Y] = yes [  ]=no Constitional: recent weight change [  ]; anorexia [  ]; fatigue Blue.Reese  ]; nausea [  ]; night sweats [  ]; fever [  ]; or chills [  ];                                                                                                                                          Dental: poor dentition[  ];   Eye : blurred vision [  ]; diplopia [   ]; vision changes [  ];  Amaurosis fugax[  ]; Resp: cough Blue.Reese  ];  wheezing[y  ];   hemoptysis[  ]; shortness of breath[y  ]; paroxysmal nocturnal dyspnea[  ]; dyspnea on exertion[ y ]; or orthopnea[  ];  GI:  gallstones[  ], vomiting[  ];  dysphagia[  ]; melena[  ];  hematochezia [  ]; heartburn[  ];   Hx of  Colonoscopy[  ]; GU: kidney stones [  ]; hematuria[  ];   dysuria [  ];  nocturia[  ];  history of     obstruction [  ];  Skin: rash, swelling[  ];, hair loss[  ];  peripheral edema[  ];  or itching[  ]; Musculosketetal: myalgias[  ];  joint swelling[  ];  joint erythema[  ];  joint pain[ y ];  back pain[y  ];  Heme/Lymph: bruising[  ];  bleeding[  ];  anemia[  ];  Neuro: TIA[  ];  headaches[  ];  stroke[  ];  vertigo[  ];  seizures[  ];   paresthesias[  ];  difficulty walking[  ];  Psych:depression[  ]; Sylvester Harder  ];  Endocrine: diabetes[  ];  thyroid dysfunction[  ];  Immunizations: Flu [  ]; Pneumococcal[  ];  Other:    Past Medical History:  Diagnosis Date  . Achilles tendon rupture 10/11   and repair  . Adenomatous polyp of colon 04/06/2010   Adenomatous polyps 3 in the descending colon, times one in the ascending colon [facility])  . Asthma   . Gout   . Hemorrhoids   . Hypercholesteremia   . Hypertension   . Sleep apnea    doesn't wear CPAP  . Syncope 10/11   in settin gof asthma exacerbation w coughing. Ech (10/11): EF > 55%, mild LVH, grade I diastolic dysfunction, nomral RV size and systolic function,. normal valves. Carotid US (10/11): minimal disease    Current Outpatient Prescriptions  Medication Sig Dispense Refill  . albuterol (PROVENTIL HFA;VENTOLIN HFA) 108 (90 Base) MCG/ACT inhaler Inhale 2 puffs into the lungs every 6 (six) hours as needed for wheezing or shortness of breath. 1 Inhaler 5  . albuterol (PROVENTIL) (2.5 MG/3ML) 0.083% nebulizer solution Take 3 mLs (2.5 mg total) by nebulization every 4 (four) hours as needed for wheezing or shortness of breath. 360 mL 11  . allopurinol (ZYLOPRIM) 100 MG tablet TAKE ONE (1)  TABLET EACH DAY 30 tablet 5  . B Complex-C (SUPER B COMPLEX PO) Take 1 tablet by mouth daily.    Marland Kitchen lisinopril (PRINIVIL,ZESTRIL) 20 MG tablet TAKE ONE (1) TABLET EACH DAY 30 tablet 5  . montelukast (SINGULAIR) 10 MG tablet Take 1 tablet (10 mg total) by mouth daily. 30 tablet 5  . Multiple Vitamins-Minerals (MULTIVITAL) tablet Take 1 tablet by mouth daily.      . simvastatin (ZOCOR) 20 MG tablet TAKE ONE TABLET EACH EVENING AS DIRECTED 30 tablet 11  . SYMBICORT 160-4.5 MCG/ACT inhaler USE 2 PUFFS TWICE DAILY 10.2 g 11  . testosterone cypionate (DEPOTESTOTERONE CYPIONATE) 100 MG/ML injection Inject 100 mg into the muscle every 7 (seven) days. On Fridays only uses 1/2 ml  For IM use only    . tiotropium (SPIRIVA) 18 MCG inhalation capsule Place 18 mcg into inhaler and inhale daily.     No current facility-administered medications for this encounter.      Allergies  Allergen Reactions  . Levaquin [Levofloxacin In D5w]     Severe headaches, skin tingling/redness  . Penicillins     REACTION: (Childhood - bumps, rash)    Social History   Social History  . Marital status: Married    Spouse name: N/A  . Number of children: Y  . Years of education: N/A   Occupational History  . 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 Smoking status: Former Smoker    Packs/day: 0.25    Years: 35.00    Types: Cigarettes    Quit date: 10/22/1995  . Smokeless tobacco: Current User    Types: Chew     Comment:  rarely smoked cigarettes and cigars.  states he smoked drugs mostly  . Alcohol use 0.0 oz/week     Comment: 6 beers daily; used to use cocaine and methamphetamine  . Drug use: No     Comment: former smokerx 27 years  . Sexual activity: Not on file   Other Topics Concern  . Not on file   Social History Narrative   Poor diet, lots of fats. Does not regularly exercise. Married. Originally from [place].     Family History  Problem Relation Age of Onset    . Hypertension Father   . Cataracts Father   . Diabetes Father   . Heart attack Mother 37  . Coronary artery disease Mother   . Dementia Mother   . Hypertension Sister   . Depression Sister   . Hypertension Brother   . Colon cancer Brother   . Diabetes Other     1st degree relative  . Colon cancer Maternal Aunt     PHYSICAL EXAM: Vitals:   01/09/17 1144  BP: (!) 153/96  Pulse: (!) 122   General:  Very tremulous and SOB of breath at rest Flushed  HEENT: normal Neck: supple. no JVD. Carotids 2+ bilat; no bruits. No lymphadenopathy or thryomegaly appreciated. Cor: PMI nondisplaced. Tachy regular. No murmurs Lungs: clear with decreased air movment Abdomen: obese soft, nontender, nondistended. No hepatosplenomegaly. No bruits or masses. Good bowel sounds. Extremities: no cyanosis, clubbing, rash, edema Neuro: alert & oriented x 3, diffuse severe tremor   ECG: sinustach 119 No ST-T wave abnormalities.    No results found for this or any previous visit (from the past 24 hour(s)). No results found.   ASSESSMENT & PLAN: 1. Progressive severe dyspnea 2. Chest pain 3. HTN 4. HL 5. COPD   I have reviewed records from Dr. Lake Bells as well as [facility] work-up in [place].   Echo images also reviewed personally.   Given severe dyspnea and chest pain without clear etiology; I agree that he needs right and left heart catheterization to further evaluate. I have reviewed the risks, indications, and alternatives to angioplasty and stenting with the patient. Risks include but are not limited to bleeding, infection, vascular injury, stroke, myocardial infection, arrhythmia, kidney injury, radiation-related injury in the case of prolonged fluoroscopy use, emergency cardiac surgery, and death. The patient understands the risks of serious complication is low (<6%) and he agrees to proceed.   Mr. Chiquito prefers to have cath in [place] near his home. I called Dr. Fletcher Anon and discussed with  him and agrees to perform cath on Monday.   We will draw pre-cath labs today.   I share concerns that anxiety may also be playing a major role here. Will await results of cath to assist in further decision making.   Glori Bickers, MD

## 2017-01-13 NOTE — Discharge Instructions (Signed)
Keep right wrist elevated on pillow above the heart for today.  Watch right wrist for evidence of bleeding or hematoma.. If bleeding or hematoma noted, hold pressure over the site for at least 15 minutes and notify the physician.  No blending or flexing of the wrist--no lifting for the remainder of the day. Notify the physician for evidence of infection or temperature. The drugs you were given will stay in your system until tomorrow, so for the next 24 hours you should not.  Drive an automobile. Make any legal decisions.  Drink any alcoholic beverages.  Today you should start with liquids and gradually work up to solid foods as your are able to tolerate them  Resume your regular medications as prescribed by your doctor.  Avoid aspirin for 24 hours.      After a 2 days no dressing as needed.  Avoid strenuous activity for the remainder of the day.  Please notify your primary physician immediately if you have any unusual bleeding, trouble breathing, fever >100 degrees or pain not relieved by the medication your doctor prescribed for your doctor prescribed for you physician

## 2017-01-31 ENCOUNTER — Encounter: Payer: 59 | Admitting: Cardiovascular Disease

## 2017-02-25 ENCOUNTER — Ambulatory Visit (INDEPENDENT_AMBULATORY_CARE_PROVIDER_SITE_OTHER): Payer: 59 | Admitting: Family Medicine

## 2017-02-25 ENCOUNTER — Encounter: Payer: Self-pay | Admitting: Family Medicine

## 2017-02-25 DIAGNOSIS — F1010 Alcohol abuse, uncomplicated: Secondary | ICD-10-CM | POA: Diagnosis not present

## 2017-02-25 DIAGNOSIS — I10 Essential (primary) hypertension: Secondary | ICD-10-CM

## 2017-02-25 DIAGNOSIS — R413 Other amnesia: Secondary | ICD-10-CM | POA: Diagnosis not present

## 2017-02-25 DIAGNOSIS — G4733 Obstructive sleep apnea (adult) (pediatric): Secondary | ICD-10-CM | POA: Diagnosis not present

## 2017-02-25 DIAGNOSIS — G250 Essential tremor: Secondary | ICD-10-CM

## 2017-02-25 DIAGNOSIS — J4530 Mild persistent asthma, uncomplicated: Secondary | ICD-10-CM

## 2017-02-25 DIAGNOSIS — I2510 Atherosclerotic heart disease of native coronary artery without angina pectoris: Secondary | ICD-10-CM

## 2017-02-25 NOTE — Assessment & Plan Note (Signed)
Well controlled. Continue current medication.  

## 2017-02-25 NOTE — Assessment & Plan Note (Signed)
mIld per cath and not likely primary cause of DOE. Likely pulmonary source.

## 2017-02-25 NOTE — Assessment & Plan Note (Signed)
Counseled pt on decrease of ETOH as able given can worsen may of his symptoms including mood, tremor, memory etc.

## 2017-02-25 NOTE — Patient Instructions (Addendum)
Decrease alcohol to no more than 2 drinks a night.  I will let you know what pulmonary's recs are regarding sleep study at home and pulmonary rehab.

## 2017-02-25 NOTE — Assessment & Plan Note (Signed)
Encouraged pt to decrease ETOH use

## 2017-02-25 NOTE — Assessment & Plan Note (Signed)
Not clear if pt would benefit from pulmonary rehab. Will forward question to pt's pulmonologist. Encouraged pt to follow up with pUlm at least once yearly.

## 2017-02-25 NOTE — Assessment & Plan Note (Signed)
Formal sleep study not covered per pt.  Wife interested in home study. I am not sure he qualifies. Will look into. Wife may have him see dentist to have oral piece made for OSA, pay out of pocket.

## 2017-02-25 NOTE — Progress Notes (Signed)
Pre visit review using our clinic review tool, if applicable. No additional management support is needed unless otherwise documented below in the visit note. 

## 2017-02-25 NOTE — Progress Notes (Signed)
Subjective:    Patient ID: Cody Day, male    DOB: 1956/04/25, 61 y.o.   MRN: [id number]  HPI   61 year old male presents for 6 month follow up.  Recent hospital admission on 3/26 for dyspnea, substernal chest tightness with any activity for cardiac cath  Summary copied follows:  Denies any h/o known cardiac disease. Had cardiac cath in 2008 with minimal luminal irregularities. He underwent a nuclear stress test in June 2016 due to ongoing dyspnea which showed no significant EKG changes with exercise. There was no significant perfusion defects but ejection fraction was calculated to be moderately reduced between 30-44%.He subsequently saw Dr. Fletcher Day an underwent an echocardiogram with ejection fraction was around 60-65% in 7/16. Recommended right and left heart cath: mild nonobstructive CAD, nml LV function Dyspnea felt to be noncardiac.  Pulm:  Dr. Lake Day  Tremor and memory loss followoed by Neuro Dr. Carles Day.  At last OV:  Severe tremor: meds not likely beneficial. Not great candiadte for DBS.  Memory loss:  Neuroimaging is normal. [name] appears to meet criteria for a diagnosis of adjustment disorder with anxiety and depressed mood as well as alcohol abuse at this time. Most likely contributing factors to [name] cognitive difficulties are untreated obstructive sleep apnea, insomnia, chronic alcohol abuse, anxiety and depression, as well as increased risk of cognitive difficulties secondary to prior history of polysubstance abuse. He demonstrates no specific pattern which would support a diagnosis of dementia at this time and some of his difficulties on current testing are likely related to performance anxiety  ? depression... Decreased motivation, no anhedonia. No real sadness, no SI.Marland Kitchen He feels flat mood, depressed per wife given not able to work.  sleeps poorly at night Sleep for 50 min. Then takes time to go back tp sleep. Sleeps 4 hours a night. No naps. TODAY: PHQ9: 9/27 Main  issue sleep and energy! GAD7:2/21   He is using cannaboid oil for tremor. ETOH abuse: 8 beers a day.  Hypertension:    Stable control on current meds. BP Readings from Last 3 Encounters:  02/25/17 120/84  01/13/17 134/81  01/09/17 (!) 153/96  Using medication without problems or lightheadedness:  yes Chest pain with exertion:yes Edema:none Short of breath: yes Average home BPs: Other issues:  Review of Systems  Constitutional: Positive for fatigue.  HENT: Negative for ear pain.   Eyes: Negative for pain.  Respiratory: Positive for shortness of breath.   Cardiovascular: Positive for chest pain. Negative for palpitations and leg swelling.       Objective:   Physical Exam  Constitutional: Vital signs are normal. He appears well-developed and well-nourished.  HENT:  Head: Normocephalic.  Right Ear: Hearing normal.  Left Ear: Hearing normal.  Nose: Nose normal.  Mouth/Throat: Oropharynx is clear and moist and mucous membranes are normal.  Neck: Trachea normal. Carotid bruit is not present. No thyroid mass and no thyromegaly present.  Cardiovascular: Normal rate, regular rhythm and normal pulses.  Exam reveals no gallop, no distant heart sounds and no friction rub.   No murmur heard. No peripheral edema  Pulmonary/Chest: Effort normal. No respiratory distress. He has decreased breath sounds. He has no wheezes. He has no rhonchi.  Neurological: He has normal strength. No cranial nerve deficit or sensory deficit. He displays a negative Romberg sign. Coordination and gait normal.  severe tremor.  Skin: Skin is warm, dry and intact. No rash noted.  Psychiatric: He has a normal mood and affect.  His speech is normal and behavior is normal. Thought content normal.          Assessment & Plan:

## 2017-02-25 NOTE — Assessment & Plan Note (Signed)
Severe. Per Dr. Carles Collet DBS best option over med, but pt not yet interested.

## 2017-03-19 ENCOUNTER — Other Ambulatory Visit: Payer: Self-pay | Admitting: Family Medicine

## 2017-03-20 ENCOUNTER — Telehealth: Payer: Self-pay | Admitting: Pulmonary Disease

## 2017-03-20 ENCOUNTER — Other Ambulatory Visit: Payer: Self-pay | Admitting: Family Medicine

## 2017-03-20 NOTE — Telephone Encounter (Signed)
Spoke with pt, advised that per chart he has never had Alpha-1 antitrypsin drawn in our office.  Pt expressed understanding.  Nothing further needed.

## 2017-04-01 ENCOUNTER — Encounter: Payer: 59 | Admitting: Cardiovascular Disease

## 2017-06-27 ENCOUNTER — Ambulatory Visit: Payer: Medicaid Other | Attending: Neurology

## 2017-06-27 DIAGNOSIS — G4733 Obstructive sleep apnea (adult) (pediatric): Secondary | ICD-10-CM | POA: Insufficient documentation

## 2017-06-27 DIAGNOSIS — F5101 Primary insomnia: Secondary | ICD-10-CM | POA: Diagnosis not present

## 2017-08-19 ENCOUNTER — Telehealth: Payer: Self-pay | Admitting: Family Medicine

## 2017-08-19 ENCOUNTER — Other Ambulatory Visit: Payer: 59

## 2017-08-19 DIAGNOSIS — E785 Hyperlipidemia, unspecified: Secondary | ICD-10-CM

## 2017-08-19 DIAGNOSIS — R7303 Prediabetes: Secondary | ICD-10-CM

## 2017-08-19 DIAGNOSIS — M1A9XX0 Chronic gout, unspecified, without tophus (tophi): Secondary | ICD-10-CM

## 2017-08-19 NOTE — Telephone Encounter (Signed)
-----   Message from Inocencio Homes, [place] sent at 08/07/2017  9:20 AM EDT ----- Regarding: labs  Patient has a lab appointment on 08/19/17, please order labs. Thank you!

## 2017-08-26 ENCOUNTER — Encounter: Payer: 59 | Admitting: Family Medicine

## 2017-08-27 ENCOUNTER — Ambulatory Visit: Payer: Medicaid Other | Attending: Neurology

## 2017-08-27 DIAGNOSIS — G4733 Obstructive sleep apnea (adult) (pediatric): Secondary | ICD-10-CM | POA: Diagnosis present

## 2017-08-27 DIAGNOSIS — F5101 Primary insomnia: Secondary | ICD-10-CM | POA: Diagnosis not present

## 2017-12-02 ENCOUNTER — Ambulatory Visit: Payer: Medicaid Other | Admitting: Family Medicine

## 2017-12-09 ENCOUNTER — Ambulatory Visit: Payer: BLUE CROSS/BLUE SHIELD | Admitting: Family Medicine

## 2017-12-09 ENCOUNTER — Encounter: Payer: Self-pay | Admitting: Family Medicine

## 2017-12-09 VITALS — BP 110/60 | HR 100 | Temp 98.0°F | Ht 71.0 in | Wt 227.8 lb

## 2017-12-09 DIAGNOSIS — G4733 Obstructive sleep apnea (adult) (pediatric): Secondary | ICD-10-CM

## 2017-12-09 DIAGNOSIS — F1010 Alcohol abuse, uncomplicated: Secondary | ICD-10-CM

## 2017-12-09 DIAGNOSIS — I10 Essential (primary) hypertension: Secondary | ICD-10-CM | POA: Diagnosis not present

## 2017-12-09 DIAGNOSIS — E785 Hyperlipidemia, unspecified: Secondary | ICD-10-CM | POA: Diagnosis not present

## 2017-12-09 DIAGNOSIS — I420 Dilated cardiomyopathy: Secondary | ICD-10-CM | POA: Diagnosis not present

## 2017-12-09 DIAGNOSIS — J4530 Mild persistent asthma, uncomplicated: Secondary | ICD-10-CM

## 2017-12-09 DIAGNOSIS — G250 Essential tremor: Secondary | ICD-10-CM | POA: Diagnosis not present

## 2017-12-09 NOTE — Patient Instructions (Signed)
Decrease alcohol intake.  We will get records from [facility]

## 2017-12-09 NOTE — Progress Notes (Signed)
   Subjective:    Patient ID: TRAVION KE, male    DOB: August 08, 1956, 62 y.o.   MRN: [id number]  HPI    62 year old male pt presents for follow up.  He is now on disability. Given COPD/asthma.  No longer able to work. Last worked in 06/2016. Has recently been going to [facility].   COPD, asthma: Stiolto, flovent. Breathing is not great but stable.      Sleep study showed moderate sleep apnea.  On CPAP, had old one.  Recent labs done:   vit d was very low.. Started on vit D supplement.   Heart cath 12/2016: mild nonobstructive CAD, nml LV function Dyspnea felt to be noncardiac.  Hypertension:    Good control on lisinopril . BP Readings from Last 3 Encounters:  12/09/17 110/60  02/25/17 120/84  01/13/17 134/81  Using medication without problems or lightheadedness:  Chest pain with exertion: Edema: Short of breath: Average home BPs: Other issues:   Stable memory issues.    Review of Systems  Constitutional: Negative for fatigue and fever.  HENT: Negative for ear pain.   Eyes: Negative for pain.  Respiratory: Negative for cough and shortness of breath.   Cardiovascular: Negative for chest pain, palpitations and leg swelling.  Gastrointestinal: Negative for abdominal pain.  Genitourinary: Negative for dysuria.  Musculoskeletal: Negative for arthralgias.  Neurological: Negative for syncope, light-headedness and headaches.  Psychiatric/Behavioral: Negative for dysphoric mood.       Objective:   Physical Exam  Constitutional: Vital signs are normal. He appears well-developed and well-nourished.  HENT:  Head: Normocephalic.  Right Ear: Hearing normal.  Left Ear: Hearing normal.  Nose: Nose normal.  Mouth/Throat: Oropharynx is clear and moist and mucous membranes are normal.  Neck: Trachea normal. Carotid bruit is not present. No thyroid mass and no thyromegaly present.  Cardiovascular: Normal rate, regular rhythm and normal pulses. Exam reveals no  gallop, no distant heart sounds and no friction rub.  No murmur heard. No peripheral edema  Pulmonary/Chest: Effort normal. No respiratory distress. He has decreased breath sounds. He has no wheezes. He has no rhonchi.  Neurological: He has normal strength. No cranial nerve deficit or sensory deficit. He displays a negative Romberg sign. Coordination and gait normal.  severe tremor.  Skin: Skin is warm, dry and intact. No rash noted.  Psychiatric: He has a normal mood and affect. His speech is normal and behavior is normal. Thought content normal.          Assessment & Plan:

## 2018-01-08 ENCOUNTER — Encounter: Payer: Self-pay | Admitting: Family Medicine

## 2018-01-08 NOTE — Assessment & Plan Note (Signed)
Well controlled. Continue current medication.  

## 2018-01-08 NOTE — Assessment & Plan Note (Signed)
Decrease or stop alcohol abuse as contributing to health issue.  offered info on AA  And outpatient/inpatient support etc.

## 2018-01-08 NOTE — Assessment & Plan Note (Signed)
Now using CPAP

## 2018-01-08 NOTE — Assessment & Plan Note (Signed)
Stiolto, flovent. Breathing is not great but stable.

## 2018-03-03 ENCOUNTER — Other Ambulatory Visit: Payer: Self-pay | Admitting: Family Medicine

## 2018-03-03 DIAGNOSIS — R7303 Prediabetes: Secondary | ICD-10-CM

## 2018-03-03 DIAGNOSIS — I10 Essential (primary) hypertension: Secondary | ICD-10-CM

## 2018-03-03 DIAGNOSIS — E785 Hyperlipidemia, unspecified: Secondary | ICD-10-CM

## 2018-03-09 ENCOUNTER — Other Ambulatory Visit: Payer: BLUE CROSS/BLUE SHIELD

## 2018-03-13 ENCOUNTER — Other Ambulatory Visit: Payer: BLUE CROSS/BLUE SHIELD

## 2018-03-13 ENCOUNTER — Ambulatory Visit: Payer: BLUE CROSS/BLUE SHIELD | Admitting: Family Medicine

## 2018-03-24 ENCOUNTER — Ambulatory Visit: Payer: BLUE CROSS/BLUE SHIELD | Admitting: Family Medicine

## 2018-04-01 ENCOUNTER — Other Ambulatory Visit (INDEPENDENT_AMBULATORY_CARE_PROVIDER_SITE_OTHER): Payer: BLUE CROSS/BLUE SHIELD

## 2018-04-01 DIAGNOSIS — R7303 Prediabetes: Secondary | ICD-10-CM

## 2018-04-01 DIAGNOSIS — E785 Hyperlipidemia, unspecified: Secondary | ICD-10-CM

## 2018-04-01 DIAGNOSIS — M1A9XX0 Chronic gout, unspecified, without tophus (tophi): Secondary | ICD-10-CM | POA: Diagnosis not present

## 2018-04-01 DIAGNOSIS — I10 Essential (primary) hypertension: Secondary | ICD-10-CM | POA: Diagnosis not present

## 2018-04-01 LAB — LIPID PANEL
Cholesterol: 168 mg/dL (ref 0–200)
HDL: 81.6 mg/dL (ref >39.00)
LDL Cholesterol: 75 mg/dL (ref 0–99)
NonHDL: 86.84
Total CHOL/HDL Ratio: 2
Triglycerides: 58 mg/dL (ref 0.0–149.0)
VLDL: 11.6 mg/dL (ref 0.0–40.0)

## 2018-04-01 LAB — COMPREHENSIVE METABOLIC PANEL
ALT: 42 U/L (ref 0–53)
AST: 68 U/L — ABNORMAL HIGH (ref 0–37)
Albumin: 4.1 g/dL (ref 3.5–5.2)
Alkaline Phosphatase: 98 U/L (ref 39–117)
BUN: 4 mg/dL — ABNORMAL LOW (ref 6–23)
CO2: 28 mEq/L (ref 19–32)
Calcium: 9.7 mg/dL (ref 8.4–10.5)
Chloride: 94 mEq/L — ABNORMAL LOW (ref 96–112)
Creatinine, Ser: 0.69 mg/dL (ref 0.40–1.50)
GFR: 123.74 mL/min (ref >60.00)
Glucose, Bld: 117 mg/dL — ABNORMAL HIGH (ref 70–99)
Potassium: 4.3 mEq/L (ref 3.5–5.1)
Sodium: 132 mEq/L — ABNORMAL LOW (ref 135–145)
Total Bilirubin: 1 mg/dL (ref 0.2–1.2)
Total Protein: 7.3 g/dL (ref 6.0–8.3)

## 2018-04-01 LAB — HEMOGLOBIN A1C: Hgb A1c MFr Bld: 6.1 % (ref 4.6–6.5)

## 2018-04-01 LAB — URIC ACID: Uric Acid, Serum: 3.5 mg/dL — ABNORMAL LOW (ref 4.0–7.8)

## 2018-04-07 ENCOUNTER — Ambulatory Visit: Payer: BLUE CROSS/BLUE SHIELD | Admitting: Family Medicine

## 2018-04-17 ENCOUNTER — Ambulatory Visit (INDEPENDENT_AMBULATORY_CARE_PROVIDER_SITE_OTHER): Payer: BLUE CROSS/BLUE SHIELD | Admitting: Family Medicine

## 2018-04-17 ENCOUNTER — Encounter: Payer: Self-pay | Admitting: Family Medicine

## 2018-04-17 VITALS — BP 90/72 | HR 116 | Temp 98.4°F | Ht 71.0 in | Wt 220.0 lb

## 2018-04-17 DIAGNOSIS — R0600 Dyspnea, unspecified: Secondary | ICD-10-CM | POA: Diagnosis not present

## 2018-04-17 DIAGNOSIS — N62 Hypertrophy of breast: Secondary | ICD-10-CM

## 2018-04-17 DIAGNOSIS — J4551 Severe persistent asthma with (acute) exacerbation: Secondary | ICD-10-CM | POA: Diagnosis not present

## 2018-04-17 DIAGNOSIS — R7989 Other specified abnormal findings of blood chemistry: Secondary | ICD-10-CM | POA: Diagnosis not present

## 2018-04-17 LAB — LUTEINIZING HORMONE: LH: 5.91 m[IU]/mL (ref 1.50–9.30)

## 2018-04-17 LAB — TSH: TSH: 2.22 u[IU]/mL (ref 0.35–4.50)

## 2018-04-17 MED ORDER — AZITHROMYCIN 250 MG PO TABS: ORAL_TABLET | ORAL | 0 refills | Status: DC

## 2018-04-17 MED ORDER — PREDNISONE 20 MG PO TABS: ORAL_TABLET | ORAL | 0 refills | Status: DC

## 2018-04-17 MED ORDER — ALBUTEROL SULFATE (2.5 MG/3ML) 0.083% IN NEBU: 2.5000 mg | INHALATION_SOLUTION | RESPIRATORY_TRACT | 11 refills | Status: DC | PRN

## 2018-04-17 NOTE — Patient Instructions (Addendum)
Complete a course of prednisone.  If not improving as expected or further change in mucus.. Complete a course of antibiotics. Work on low [employer].   Please stop at the lab to have labs drawn.

## 2018-04-17 NOTE — Progress Notes (Signed)
   Subjective:    Patient ID: Cody Day, male    DOB: 12-02-1955, 62 y.o.   MRN: [id number]  HPI  62 year old  male presents for 3 month follow up. He reports that in last 3 days he has had a flare of COPD. Increase in coughing, increase in productive mucus, really thick. No fever.  DOE.Marland Kitchen Now DOE with getting dressed.  He is now on disability. Given COPD/asthma.  No longer able to work. Last worked in 06/2016.  COPD, asthma: Stiolto, flovent.  Albuterol prn.  Symbicort as needed.  Sleep study showed moderate sleep apnea.  On CPAP, had old one.  Heart cath 12/2016: mild nonobstructive CAD, nml LV function Dyspnea felt to be noncardiac.   He wonders if he is low in testosterone.  Has had low testosterone in past. Saw Uro Dr. Jacqlyn Larsen.  Not on current injection. He would like to restart. He does not want to go back to uro.  Needs re-eval.   He has noted thickening and enlargement in breast tissue centrally last several month. Soreness. No skin change.  No family history of breast cancer.  Review of Systems  Constitutional: Negative for fatigue and fever.  HENT: Negative for ear pain.   Eyes: Negative for pain.  Respiratory: Positive for cough and shortness of breath.   Cardiovascular: Negative for chest pain, palpitations and leg swelling.  Gastrointestinal: Negative for abdominal pain.  Genitourinary: Negative for dysuria.  Musculoskeletal: Negative for arthralgias.  Neurological: Negative for syncope, light-headedness and headaches.  Psychiatric/Behavioral: Negative for dysphoric mood.       Objective:   Physical Exam  Constitutional: Vital signs are normal. He appears well-developed and well-nourished.  HENT:  Head: Normocephalic.  Right Ear: Hearing normal.  Left Ear: Hearing normal.  Nose: Nose normal.  Mouth/Throat: Oropharynx is clear and moist and mucous membranes are normal.  Neck: Trachea normal. Carotid bruit is not present. No thyroid mass and no thyromegaly  present.  Cardiovascular: Normal rate, regular rhythm and normal pulses. Exam reveals no gallop, no distant heart sounds and no friction rub.  No murmur heard. No peripheral edema  Pulmonary/Chest: Effort normal. No respiratory distress. He has decreased breath sounds. He has wheezes. He has no rhonchi.  Diffuse gynecomastia bilaterally, no masses  Genitourinary:  Genitourinary Comments: No hypogonadism  Neurological: He has normal strength. No cranial nerve deficit or sensory deficit. He displays a negative Romberg sign. Coordination and gait normal.  severe tremor.  Skin: Skin is warm, dry and intact. No rash noted.  Psychiatric: He has a normal mood and affect. His speech is normal and behavior is normal. Thought content normal.          Assessment & Plan:

## 2018-04-20 LAB — TESTOSTERONE TOTAL,FREE,BIO, MALES
Albumin: 3.9 g/dL (ref 3.6–5.1)
Sex Hormone Binding: 97 nmol/L — ABNORMAL HIGH (ref 22–77)
Testosterone, Bioavailable: 44 ng/dL — ABNORMAL LOW (ref >=110.0)
Testosterone, Free: 24.5 pg/mL — ABNORMAL LOW (ref 46.0–224.0)
Testosterone: 482 ng/dL (ref 250–827)

## 2018-04-20 LAB — PROLACTIN: Prolactin: 8.7 ng/mL (ref 2.0–18.0)

## 2018-04-22 ENCOUNTER — Other Ambulatory Visit: Payer: Self-pay | Admitting: Family Medicine

## 2018-04-22 DIAGNOSIS — R7989 Other specified abnormal findings of blood chemistry: Secondary | ICD-10-CM

## 2018-04-30 ENCOUNTER — Other Ambulatory Visit (INDEPENDENT_AMBULATORY_CARE_PROVIDER_SITE_OTHER): Payer: BLUE CROSS/BLUE SHIELD

## 2018-04-30 DIAGNOSIS — R7989 Other specified abnormal findings of blood chemistry: Secondary | ICD-10-CM | POA: Diagnosis not present

## 2018-04-30 LAB — TESTOSTERONE: Testosterone: 319.44 ng/dL (ref 300.00–890.00)

## 2018-06-09 ENCOUNTER — Other Ambulatory Visit: Payer: Self-pay | Admitting: Family Medicine

## 2018-06-09 MED ORDER — MONTELUKAST SODIUM 10 MG PO TABS: 10.0000 mg | ORAL_TABLET | Freq: Every day | ORAL | 1 refills | Status: DC

## 2018-06-09 MED ORDER — VITAMIN D3 1.25 MG (50000 UT) PO CAPS: 1.0000 | ORAL_CAPSULE | ORAL | 1 refills | Status: DC

## 2018-06-09 MED ORDER — ALLOPURINOL 300 MG PO TABS: 300.0000 mg | ORAL_TABLET | Freq: Every day | ORAL | 1 refills | Status: DC

## 2018-06-09 NOTE — Telephone Encounter (Signed)
Received faxed refill request.  Medications have not been prescribed by Dr Diona Browner. Last OV on 03/03/2018.

## 2018-06-09 NOTE — Telephone Encounter (Signed)
Okay to fill all for 6 months... Does he want 90 supply at a time?

## 2018-06-09 NOTE — Telephone Encounter (Signed)
90 day prescriptions sent to [facility].

## 2018-06-12 ENCOUNTER — Other Ambulatory Visit: Payer: Self-pay | Admitting: *Deleted

## 2018-06-12 MED ORDER — LISINOPRIL 20 MG PO TABS: ORAL_TABLET | ORAL | 1 refills | Status: DC

## 2018-06-18 DIAGNOSIS — R7989 Other specified abnormal findings of blood chemistry: Secondary | ICD-10-CM | POA: Insufficient documentation

## 2018-06-18 NOTE — Assessment & Plan Note (Signed)
Likely currently due to asthma/COPD flare.. Treat with course of prednisone.

## 2018-06-18 NOTE — Assessment & Plan Note (Signed)
Eval with labs for secondary cause. No clear med SE.

## 2018-06-18 NOTE — Assessment & Plan Note (Signed)
Re-eval.. May be causing gynecomastia.

## 2018-07-20 ENCOUNTER — Emergency Department: Payer: BLUE CROSS/BLUE SHIELD

## 2018-07-20 ENCOUNTER — Encounter: Payer: Self-pay | Admitting: Emergency Medicine

## 2018-07-20 ENCOUNTER — Other Ambulatory Visit: Payer: Self-pay

## 2018-07-20 ENCOUNTER — Inpatient Hospital Stay
Admission: EM | Admit: 2018-07-20 | Discharge: 2018-07-22 | DRG: 897 | Disposition: A | Discharge status: Home / Self Care | Payer: BLUE CROSS/BLUE SHIELD | Attending: Internal Medicine | Admitting: Internal Medicine

## 2018-07-20 DIAGNOSIS — J449 Chronic obstructive pulmonary disease, unspecified: Secondary | ICD-10-CM | POA: Diagnosis present

## 2018-07-20 DIAGNOSIS — F17220 Nicotine dependence, chewing tobacco, uncomplicated: Secondary | ICD-10-CM | POA: Diagnosis present

## 2018-07-20 DIAGNOSIS — E7800 Pure hypercholesterolemia, unspecified: Secondary | ICD-10-CM | POA: Diagnosis present

## 2018-07-20 DIAGNOSIS — Z86010 Personal history of colonic polyps: Secondary | ICD-10-CM | POA: Diagnosis not present

## 2018-07-20 DIAGNOSIS — F10129 Alcohol abuse with intoxication, unspecified: Secondary | ICD-10-CM | POA: Diagnosis not present

## 2018-07-20 DIAGNOSIS — Y908 Blood alcohol level of 240 mg/100 ml or more: Secondary | ICD-10-CM | POA: Diagnosis present

## 2018-07-20 DIAGNOSIS — F329 Major depressive disorder, single episode, unspecified: Secondary | ICD-10-CM | POA: Diagnosis present

## 2018-07-20 DIAGNOSIS — E872 Acidosis, unspecified: Secondary | ICD-10-CM

## 2018-07-20 DIAGNOSIS — I10 Essential (primary) hypertension: Secondary | ICD-10-CM | POA: Diagnosis present

## 2018-07-20 DIAGNOSIS — Z800 Family history of malignant neoplasm of digestive organs: Secondary | ICD-10-CM

## 2018-07-20 DIAGNOSIS — E860 Dehydration: Secondary | ICD-10-CM | POA: Diagnosis present

## 2018-07-20 DIAGNOSIS — G4730 Sleep apnea, unspecified: Secondary | ICD-10-CM | POA: Diagnosis present

## 2018-07-20 DIAGNOSIS — Z880 Allergy status to penicillin: Secondary | ICD-10-CM

## 2018-07-20 DIAGNOSIS — Z888 Allergy status to other drugs, medicaments and biological substances status: Secondary | ICD-10-CM | POA: Diagnosis not present

## 2018-07-20 DIAGNOSIS — R55 Syncope and collapse: Secondary | ICD-10-CM | POA: Diagnosis present

## 2018-07-20 DIAGNOSIS — Y9289 Other specified places as the place of occurrence of the external cause: Secondary | ICD-10-CM

## 2018-07-20 DIAGNOSIS — Z8249 Family history of ischemic heart disease and other diseases of the circulatory system: Secondary | ICD-10-CM

## 2018-07-20 DIAGNOSIS — Z79899 Other long term (current) drug therapy: Secondary | ICD-10-CM | POA: Diagnosis not present

## 2018-07-20 DIAGNOSIS — W01198A Fall on same level from slipping, tripping and stumbling with subsequent striking against other object, initial encounter: Secondary | ICD-10-CM | POA: Diagnosis present

## 2018-07-20 DIAGNOSIS — M109 Gout, unspecified: Secondary | ICD-10-CM | POA: Diagnosis present

## 2018-07-20 DIAGNOSIS — Z23 Encounter for immunization: Secondary | ICD-10-CM

## 2018-07-20 DIAGNOSIS — R739 Hyperglycemia, unspecified: Secondary | ICD-10-CM | POA: Diagnosis present

## 2018-07-20 DIAGNOSIS — E871 Hypo-osmolality and hyponatremia: Secondary | ICD-10-CM | POA: Diagnosis present

## 2018-07-20 LAB — CBC
HCT: 40.7 % (ref 40.0–52.0)
HCT: 36.5 % — ABNORMAL LOW (ref 40.0–52.0)
Hemoglobin: 14.3 g/dL (ref 13.0–18.0)
Hemoglobin: 12.9 g/dL — ABNORMAL LOW (ref 13.0–18.0)
MCH: 33.2 pg (ref 26.0–34.0)
MCH: 33 pg (ref 26.0–34.0)
MCHC: 35.1 g/dL (ref 32.0–36.0)
MCHC: 35.2 g/dL (ref 32.0–36.0)
MCV: 94.8 fL (ref 80.0–100.0)
MCV: 93.8 fL (ref 80.0–100.0)
Platelets: 175 10*3/uL (ref 150–440)
Platelets: 145 10*3/uL — ABNORMAL LOW (ref 150–440)
RBC: 4.29 MIL/uL — ABNORMAL LOW (ref 4.40–5.90)
RBC: 3.9 MIL/uL — ABNORMAL LOW (ref 4.40–5.90)
RDW: 15.7 % — ABNORMAL HIGH (ref 11.5–14.5)
RDW: 16 % — ABNORMAL HIGH (ref 11.5–14.5)
WBC: 4.7 10*3/uL (ref 3.8–10.6)
WBC: 5.1 10*3/uL (ref 3.8–10.6)

## 2018-07-20 LAB — BASIC METABOLIC PANEL
Anion gap: 16 — ABNORMAL HIGH (ref 5–15)
Anion gap: 7 (ref 5–15)
BUN: <5 mg/dL — ABNORMAL LOW (ref 8–23)
BUN: <5 mg/dL — ABNORMAL LOW (ref 8–23)
CO2: 15 mmol/L — ABNORMAL LOW (ref 22–32)
CO2: 24 mmol/L (ref 22–32)
Calcium: 8.6 mg/dL — ABNORMAL LOW (ref 8.9–10.3)
Calcium: 8.3 mg/dL — ABNORMAL LOW (ref 8.9–10.3)
Chloride: 95 mmol/L — ABNORMAL LOW (ref 98–111)
Chloride: 99 mmol/L (ref 98–111)
Creatinine, Ser: 0.61 mg/dL (ref 0.61–1.24)
Creatinine, Ser: 0.6 mg/dL — ABNORMAL LOW (ref 0.61–1.24)
GFR calc Af Amer: >60 mL/min (ref >60)
GFR calc Af Amer: >60 mL/min (ref >60)
GFR calc non Af Amer: >60 mL/min (ref >60)
GFR calc non Af Amer: >60 mL/min (ref >60)
Glucose, Bld: 149 mg/dL — ABNORMAL HIGH (ref 70–99)
Glucose, Bld: 113 mg/dL — ABNORMAL HIGH (ref 70–99)
Potassium: 3.7 mmol/L (ref 3.5–5.1)
Potassium: 3.3 mmol/L — ABNORMAL LOW (ref 3.5–5.1)
Sodium: 126 mmol/L — ABNORMAL LOW (ref 135–145)
Sodium: 130 mmol/L — ABNORMAL LOW (ref 135–145)

## 2018-07-20 LAB — ETHANOL: Alcohol, Ethyl (B): 243 mg/dL — ABNORMAL HIGH (ref <10)

## 2018-07-20 LAB — URINALYSIS, COMPLETE (UACMP) WITH MICROSCOPIC
Bacteria, UA: NONE SEEN
Bilirubin Urine: NEGATIVE
Glucose, UA: NEGATIVE mg/dL
Hgb urine dipstick: NEGATIVE
Ketones, ur: NEGATIVE mg/dL
Leukocytes, UA: NEGATIVE
Nitrite: NEGATIVE
Protein, ur: NEGATIVE mg/dL
Specific Gravity, Urine: 1.004 — ABNORMAL LOW (ref 1.005–1.030)
pH: 5 (ref 5.0–8.0)

## 2018-07-20 LAB — HEPATIC FUNCTION PANEL
ALT: 66 U/L — ABNORMAL HIGH (ref 0–44)
AST: 115 U/L — ABNORMAL HIGH (ref 15–41)
Albumin: 3.3 g/dL — ABNORMAL LOW (ref 3.5–5.0)
Alkaline Phosphatase: 79 U/L (ref 38–126)
Bilirubin, Direct: 0.3 mg/dL — ABNORMAL HIGH (ref 0.0–0.2)
Indirect Bilirubin: 0.7 mg/dL (ref 0.3–0.9)
Total Bilirubin: 1 mg/dL (ref 0.3–1.2)
Total Protein: 6.3 g/dL — ABNORMAL LOW (ref 6.5–8.1)

## 2018-07-20 LAB — TYPE AND SCREEN
ABO/RH(D): A POS
Antibody Screen: NEGATIVE

## 2018-07-20 LAB — MAGNESIUM: Magnesium: 1.6 mg/dL — ABNORMAL LOW (ref 1.7–2.4)

## 2018-07-20 LAB — PHOSPHORUS: Phosphorus: 1.8 mg/dL — ABNORMAL LOW (ref 2.5–4.6)

## 2018-07-20 LAB — LACTIC ACID, PLASMA: Lactic Acid, Venous: 4.6 mmol/L (ref 0.5–1.9)

## 2018-07-20 MED ORDER — LORAZEPAM 1 MG PO TABS
1.0000 mg | ORAL_TABLET | Freq: Four times a day (QID) | ORAL | Status: DC | PRN
  Filled 2018-07-20: qty 1

## 2018-07-20 MED ORDER — ACETAMINOPHEN 325 MG PO TABS
650.0000 mg | ORAL_TABLET | Freq: Four times a day (QID) | ORAL | Status: DC | PRN
  Administered 2018-07-21 (×2): 650 mg via ORAL
  Filled 2018-07-20 (×2): qty 2

## 2018-07-20 MED ORDER — ADULT MULTIVITAMIN W/MINERALS CH
1.0000 | ORAL_TABLET | Freq: Every day | ORAL | Status: DC
  Administered 2018-07-20 – 2018-07-22 (×3): 1 via ORAL
  Filled 2018-07-20 (×3): qty 1

## 2018-07-20 MED ORDER — ENOXAPARIN SODIUM 40 MG/0.4ML ~~LOC~~ SOLN
40.0000 mg | SUBCUTANEOUS | Status: DC
  Administered 2018-07-20: 40 mg via SUBCUTANEOUS
  Filled 2018-07-20: qty 0.4

## 2018-07-20 MED ORDER — FOLIC ACID 1 MG PO TABS
1.0000 mg | ORAL_TABLET | Freq: Every day | ORAL | Status: DC
  Administered 2018-07-20 – 2018-07-22 (×3): 1 mg via ORAL
  Filled 2018-07-20 (×3): qty 1

## 2018-07-20 MED ORDER — VITAMIN D (ERGOCALCIFEROL) 1.25 MG (50000 UNIT) PO CAPS: 50000.0000 [IU] | ORAL_CAPSULE | ORAL | Status: DC

## 2018-07-20 MED ORDER — ALBUTEROL SULFATE (2.5 MG/3ML) 0.083% IN NEBU: 2.5000 mg | INHALATION_SOLUTION | RESPIRATORY_TRACT | Status: DC | PRN

## 2018-07-20 MED ORDER — SODIUM CHLORIDE 0.9 % IV BOLUS
1000.0000 mL | Freq: Once | INTRAVENOUS | Status: AC
  Administered 2018-07-20: 1000 mL via INTRAVENOUS

## 2018-07-20 MED ORDER — MONTELUKAST SODIUM 10 MG PO TABS
10.0000 mg | ORAL_TABLET | Freq: Every day | ORAL | Status: DC
  Administered 2018-07-20 – 2018-07-22 (×3): 10 mg via ORAL
  Filled 2018-07-20 (×3): qty 1

## 2018-07-20 MED ORDER — VITAMIN B-1 100 MG PO TABS
100.0000 mg | ORAL_TABLET | Freq: Every day | ORAL | Status: DC
  Administered 2018-07-21 – 2018-07-22 (×2): 100 mg via ORAL
  Filled 2018-07-20 (×2): qty 1

## 2018-07-20 MED ORDER — ALLOPURINOL 300 MG PO TABS
300.0000 mg | ORAL_TABLET | Freq: Every day | ORAL | Status: DC
  Administered 2018-07-21 – 2018-07-22 (×2): 300 mg via ORAL
  Filled 2018-07-20 (×2): qty 1

## 2018-07-20 MED ORDER — SODIUM CHLORIDE 0.9% FLUSH: 3.0000 mL | INTRAVENOUS | Status: DC | PRN

## 2018-07-20 MED ORDER — VITAMIN B-1 100 MG PO TABS
100.0000 mg | ORAL_TABLET | Freq: Once | ORAL | Status: AC
  Administered 2018-07-20: 100 mg via ORAL
  Filled 2018-07-20: qty 1

## 2018-07-20 MED ORDER — THIAMINE HCL 100 MG/ML IJ SOLN
100.0000 mg | Freq: Every day | INTRAMUSCULAR | Status: DC
  Filled 2018-07-20 (×2): qty 1

## 2018-07-20 MED ORDER — LORAZEPAM 2 MG PO TABS: 0.0000 mg | ORAL_TABLET | Freq: Two times a day (BID) | ORAL | Status: DC

## 2018-07-20 MED ORDER — SODIUM CHLORIDE 0.9% FLUSH
3.0000 mL | Freq: Two times a day (BID) | INTRAVENOUS | Status: DC
  Administered 2018-07-20 – 2018-07-21 (×2): 3 mL via INTRAVENOUS

## 2018-07-20 MED ORDER — ONDANSETRON HCL 4 MG/2ML IJ SOLN: 4.0000 mg | Freq: Four times a day (QID) | INTRAMUSCULAR | Status: DC | PRN

## 2018-07-20 MED ORDER — LORAZEPAM 2 MG/ML IJ SOLN: 1.0000 mg | Freq: Four times a day (QID) | INTRAMUSCULAR | Status: DC | PRN

## 2018-07-20 MED ORDER — LORAZEPAM 2 MG PO TABS
0.0000 mg | ORAL_TABLET | Freq: Four times a day (QID) | ORAL | Status: DC
  Administered 2018-07-20: 2 mg via ORAL
  Administered 2018-07-20: 1 mg via ORAL
  Administered 2018-07-21 – 2018-07-22 (×2): 2 mg via ORAL
  Filled 2018-07-20 (×3): qty 1

## 2018-07-20 MED ORDER — ACETAMINOPHEN 650 MG RE SUPP: 650.0000 mg | Freq: Four times a day (QID) | RECTAL | Status: DC | PRN

## 2018-07-20 MED ORDER — ONDANSETRON HCL 4 MG PO TABS: 4.0000 mg | ORAL_TABLET | Freq: Four times a day (QID) | ORAL | Status: DC | PRN

## 2018-07-20 MED ORDER — SENNOSIDES-DOCUSATE SODIUM 8.6-50 MG PO TABS: 1.0000 | ORAL_TABLET | Freq: Every evening | ORAL | Status: DC | PRN

## 2018-07-20 MED ORDER — SODIUM CHLORIDE 0.9 % IV SOLN
INTRAVENOUS | Status: DC
  Administered 2018-07-20 – 2018-07-21 (×3): via INTRAVENOUS

## 2018-07-20 MED ORDER — SODIUM CHLORIDE 0.9 % IV SOLN: 250.0000 mL | INTRAVENOUS | Status: DC | PRN

## 2018-07-20 NOTE — ED Notes (Signed)
Pt to XR

## 2018-07-20 NOTE — ED Notes (Signed)
XR at bedside for knee, pt aware we need urine sample

## 2018-07-20 NOTE — ED Notes (Addendum)
Date and time results received: 07/20/18 1551  Test: Lactic Acid Critical Value: 4.6  Name of Provider Notified: Quentin Cornwall MD  Orders Received? Or Actions Taken?: MD notified, VSS, Pt remains a & ox4. ABCs intact. Will continue to monitor.

## 2018-07-20 NOTE — ED Provider Notes (Signed)
[facility] Emergency Department Provider Note    First MD Initiated Contact with Patient 07/20/18 1413     (approximate)  I have reviewed the triage vital signs and the nursing notes.   HISTORY  Chief Complaint Fall and Dizziness    HPI Cody Day is a 62 y.o. male the below listed past medical history presents the ER after near syncopal episode where he was going over a tree of trash can from the road this morning on his way back fell into the grass because he got severely lightheaded.  Does admit to drinking 2 cans of beer this morning.  Drinks at least 6 cans of beer daily.  Did not hit his head.  Denies any pain other than right knee pain where he had a mechanical fall 2 days ago.  Denies any nausea or vomiting.  No chest pain or shortness of breath.  No palpitations.  Family does report poor home  PO ntake.    Past Medical History:  Diagnosis Date  . Achilles tendon rupture 10/11   and repair  . Adenomatous polyp of colon 04/06/2010   Adenomatous polyps 3 in the descending colon, times one in the ascending colon [facility])  . Asthma   . Gout   . Hemorrhoids   . Hypercholesteremia   . Hypertension   . Sleep apnea    doesn't wear CPAP  . Syncope 10/11   in settin gof asthma exacerbation w coughing. Ech (10/11): EF > 55%, mild LVH, grade I diastolic dysfunction, nomral RV size and systolic function,. normal valves. Carotid US (10/11): minimal disease   Family History  Problem Relation Age of Onset  . Hypertension Father   . Cataracts Father   . Diabetes Father   . Heart attack Mother 60  . Coronary artery disease Mother   . Dementia Mother   . Hypertension Sister   . Depression Sister   . Hypertension Brother   . Colon cancer Brother   . Diabetes Other        1st degree relative  . Colon cancer Maternal Aunt    Past Surgical History:  Procedure Laterality Date  . ABDOMINAL HERNIA REPAIR  1998,2000  . calcium kidney stones      . [facility], [name], [place]; Dr. [name]. Multiple adenomatous polyps (total 4).   . COLONOSCOPY WITH PROPOFOL N/A 08/30/2015   Procedure: COLONOSCOPY WITH PROPOFOL;  Surgeon: [name], MD;  Location: [facility] ENDOSCOPY;  Service: Endoscopy;  Laterality: N/A;  . FEMORAL HERNIA REPAIR  2007  . HEMORRHOID SURGERY    . HERNIA REPAIR  09/10/2010   Repair of recurrent ventral hernia, resection of previously placed mesh x2, implantation 7.5 x 10 cm Gore-Tex dual mesh in an underlay position, repair of epigastric hernia with a 4.2 cm Proceed ventral patch.  Marland Kitchen HERNIA REPAIR   02//28/2007   Laparoscopic right inguinal hernia repair with Surgipro mesh, Ventrilex patch at umbilicus  . HERNIA REPAIR  11/01/1990  .   Small oval Kugel patch placed in preperitoneal space, [name], MD  . HERNIA REPAIR  01/17/1997   Recurrent ventral hernia 15 x 19 Gore-Tex dual mesh placed laparoscopically with multiple lefft--sided ports, [name], MD  . HERNIA REPAIR  01/07/1995    Primary repair of ventral hernia. [name], MD  . intestinal blockage     as a child  .  KIDNEY STONE extraction     unic acid stones  . left heart cath  2008   minimal luminal irregularities EF 55%  . RIGHT/LEFT HEART CATH AND CORONARY ANGIOGRAPHY N/A 01/13/2017   Procedure: Right/Left Heart Cath and Coronary Angiography;  Surgeon: [name], MD;  Location: [place] [facility];  Service: Cardiovascular;  Laterality: N/A;  . treadmill stress test  2003   Patient Active Problem List   Diagnosis Date Noted  . Low testosterone 06/18/2018  . Gynecomastia 04/17/2018  . ETOH abuse 02/25/2017  . Enteritis due to Clostridium difficile 07/29/2016  . History of colonic polyps 06/30/2015  . Cardiomyopathy 04/25/2015  . Dyspnea 03/22/2015  . Neuropathy 07/28/2014  . Benign essential tremor 07/28/2014  . History of ventral hernia repair 05/24/2014   . Allergic rhinitis 03/21/2014  . Memory loss 03/26/2013  . Generalized anxiety disorder 12/01/2012  . OSA (obstructive sleep apnea) 12/05/2011  . NECK PAIN, RIGHT 05/04/2008  . Coronary atherosclerosis 12/08/2007  . PROSTATITIS, CHRONIC 08/20/2007  . NEPHROLITHIASIS, HX OF 08/20/2007  . Hyperlipidemia LDL goal <70 06/17/2007  . Prediabetes 06/17/2007  . Gout 06/09/2007  . MORBID OBESITY 06/09/2007  . Essential hypertension, benign 06/09/2007  . Chronic asthma 06/09/2007  . Exertional chest pain 06/09/2007      Prior to Admission medications   Medication Sig Start Date End Date Taking? Authorizing Provider  albuterol (PROVENTIL HFA;VENTOLIN HFA) 108 (90 Base) MCG/ACT inhaler Inhale 2 puffs into the lungs every 6 (six) hours as needed for wheezing or shortness of breath. 04/26/16   Juanito Doom, MD  albuterol (PROVENTIL) (2.5 MG/3ML) 0.083% nebulizer solution Take 3 mLs (2.5 mg total) by nebulization every 4 (four) hours as needed for wheezing or shortness of breath. 04/17/18   Bedsole, Amy E, MD  allopurinol (ZYLOPRIM) 300 MG tablet Take 1 tablet (300 mg total) by mouth daily. 06/09/18   Bedsole, Amy E, MD  azithromycin (ZITHROMAX) 250 MG tablet 2 tab po x 1 day then 1 tab po daily 04/17/18   Bedsole, Amy E, MD  Cholecalciferol (VITAMIN D3) 50000 units CAPS Take 1 capsule by mouth once a week. 06/09/18   Bedsole, Amy E, MD  fluticasone (FLOVENT HFA) 220 MCG/ACT inhaler Inhale 2 puffs into the lungs 2 (two) times daily.    [provider]  lisinopril (PRINIVIL,ZESTRIL) 20 MG tablet TAKE ONE (1) TABLET EACH DAY 06/12/18   Bedsole, Amy E, MD  montelukast (SINGULAIR) 10 MG tablet Take 1 tablet (10 mg total) by mouth daily. 06/09/18 06/09/19  Bedsole, Amy E, MD  predniSONE (DELTASONE) 20 MG tablet 3 tabs by mouth daily x 3 days, then 2 tabs by mouth daily x 2 days then 1 tab by mouth daily x 2 days 04/17/18   Diona Browner, Amy E, MD  simvastatin (ZOCOR) 20 MG tablet TAKE ONE TABLET EACH  EVENING AS DIRECTED 09/03/16   Bedsole, Amy E, MD  testosterone cypionate (DEPOTESTOTERONE CYPIONATE) 100 MG/ML injection Inject 100 mg into the muscle every 7 (seven) days. On Fridays only uses 1/2 ml  For IM use only    [provider]  Tiotropium Bromide-Olodaterol (STIOLTO RESPIMAT) 2.5-2.5 MCG/ACT AERS Inhale 2 puffs into the lungs daily.    [provider]    Allergies Levaquin [levofloxacin in d5w] and Penicillins    Social History Social History   Tobacco Use  . Smoking status: Former Smoker    Packs/day: 0.25    Years: 35.00    Pack years: 8.75  Types: Cigarettes    Last attempt to quit: 10/22/1995    Years since quitting: 22.7  . Smokeless tobacco: Current User    Types: Chew  . Tobacco comment: rarely smoked cigarettes and cigars.  states he smoked drugs mostly  Substance Use Topics  . Alcohol use: Yes    Alcohol/week: 0.0 standard drinks    Comment: 6 beers daily; used to use cocaine and methamphetamine  . Drug use: No    Types: Marijuana, Methamphetamines    Comment: former smokerx 27 years    Review of Systems Patient denies headaches, rhinorrhea, blurry vision, numbness, shortness of breath, chest pain, edema, cough, abdominal pain, nausea, vomiting, diarrhea, dysuria, fevers, rashes or hallucinations unless otherwise stated above in HPI. ____________________________________________   PHYSICAL EXAM:  VITAL SIGNS: Vitals:   07/20/18 1540 07/20/18 1600  BP: 127/82 117/76  Pulse: 82 89  Resp: 17 17  Temp:    SpO2: 100% 99%    Constitutional: Alert and oriented.  Eyes: Conjunctivae are normal.  Head: Atraumatic. Nose: No congestion/rhinnorhea. Mouth/Throat: Mucous membranes are moist.   Neck: No stridor. Painless ROM.  Cardiovascular: Normal rate, regular rhythm. Grossly normal heart sounds.  Good peripheral circulation. Respiratory: Normal respiratory effort.  No retractions. Lungs CTAB. Gastrointestinal: Soft and nontender. No  distention. No abdominal bruits. No CVA tenderness. Genitourinary:  Musculoskeletal: No lower extremity tenderness nor edema.  No joint effusions. Neurologic:  Normal speech and language. No gross focal neurologic deficits are appreciated. No facial droop Skin:  Skin is warm, dry and intact. No rash noted. Psychiatric: Mood and affect are normal. Speech and behavior are normal.  ____________________________________________   LABS (all labs ordered are listed, but only abnormal results are displayed)  Results for orders placed or performed during the hospital encounter of 07/20/18 (from the past 24 hour(s))  Basic metabolic panel     Status: Abnormal   Collection Time: 07/20/18  1:53 PM  Result Value Ref Range   Sodium 126 (L) 135 - 145 mmol/L   Potassium 3.7 3.5 - 5.1 mmol/L   Chloride 95 (L) 98 - 111 mmol/L   CO2 15 (L) 22 - 32 mmol/L   Glucose, Bld 149 (H) 70 - 99 mg/dL   BUN <5 (L) 8 - 23 mg/dL   Creatinine, Ser 0.61 0.61 - 1.24 mg/dL   Calcium 8.6 (L) 8.9 - 10.3 mg/dL   GFR calc non Af Amer >60 >60 mL/min   GFR calc Af Amer >60 >60 mL/min   Anion gap 16 (H) 5 - 15  CBC     Status: Abnormal   Collection Time: 07/20/18  1:53 PM  Result Value Ref Range   WBC 4.7 3.8 - 10.6 K/uL   RBC 4.29 (L) 4.40 - 5.90 MIL/uL   Hemoglobin 14.3 13.0 - 18.0 g/dL   HCT 40.7 40.0 - 52.0 %   MCV 94.8 80.0 - 100.0 fL   MCH 33.2 26.0 - 34.0 pg   MCHC 35.1 32.0 - 36.0 g/dL   RDW 15.7 (H) 11.5 - 14.5 %   Platelets 175 150 - 440 K/uL  Ethanol     Status: Abnormal   Collection Time: 07/20/18  1:53 PM  Result Value Ref Range   Alcohol, Ethyl (B) 243 (H) <10 mg/dL  Type and screen [place]     Status: None   Collection Time: 07/20/18  2:25 PM  Result Value Ref Range   ABO/RH(D) A POS    Antibody Screen NEG  Sample Expiration      07/23/2018 Performed at [facility], [place], [place] [address]   Lactic acid, plasma     Status: Abnormal    Collection Time: 07/20/18  2:59 PM  Result Value Ref Range   Lactic Acid, Venous 4.6 (HH) 0.5 - 1.9 mmol/L   ____________________________________________  EKG My review and personal interpretation at Time: 13:36   Indication: syncope  Rate: 115  Rhythm: sinus Axis: normal Other: normal intervals, no stemi ____________________________________________  RADIOLOGY  I personally reviewed all radiographic images ordered to evaluate for the above acute complaints and reviewed radiology reports and findings.  These findings were personally discussed with the patient.  Please see medical record for radiology report.  ____________________________________________   PROCEDURES  Procedure(s) performed:  .Critical Care Performed by: Merlyn Lot, MD Authorized by: Merlyn Lot, MD   Critical care provider statement:    Critical care time (minutes):  30   Critical care time was exclusive of:  Separately billable procedures and treating other patients   Critical care was necessary to treat or prevent imminent or life-threatening deterioration of the following conditions:  Metabolic crisis   Critical care was time spent personally by me on the following activities:  Development of treatment plan with patient or surrogate, discussions with consultants, evaluation of patient's response to treatment, examination of patient, obtaining history from patient or surrogate, ordering and performing treatments and interventions, ordering and review of laboratory studies, ordering and review of radiographic studies, pulse oximetry, re-evaluation of patient's condition and review of old charts      Critical Care performed: yes ____________________________________________   INITIAL IMPRESSION / ASSESSMENT AND PLAN / ED COURSE  Pertinent labs & imaging results that were available during my care of the patient were reviewed by me and considered in my medical decision making (see chart for  details).   DDX: dehydration, electroylte abn, anemia, GI bleed,   Cody Day is a 62 y.o. who presents to the ED with symptoms as described above.  Patient arrives tachycardic and hypotensive.  Protecting his airway.  No evidence of trauma.  Letter will be sent for the above differential.  Doubt cardiac syncope.  Likely some profound dehydration.  Does admit to alcohol intoxication.  The patient will be placed on continuous pulse oximetry and telemetry for monitoring.  Laboratory evaluation will be sent to evaluate for the above complaints.     Clinical Course as of Jul 20 1629  Mon Jul 20, 2018  1424 BP improving with IV hydration.  Will give additional bolus and reassess.   [PR]  1616 Patient does have significant acidosis lactic acidosis.  Given his hypotension tachycardia and lactate with syncopal episode do feel patient will benefit from admission for continued IV fluids particular given his acute hyponatremia.   [PR]    Clinical Course User Index [PR] Merlyn Lot, MD     As part of my medical decision making, I reviewed the following data within the [place] notes reviewed and incorporated, Labs reviewed, notes from prior ED visits.   ____________________________________________   FINAL CLINICAL IMPRESSION(S) / ED DIAGNOSES  Final diagnoses:  Dehydration  Syncope and collapse  Lactic acidosis      NEW MEDICATIONS STARTED DURING THIS VISIT:  New Prescriptions   No medications on file     Note:  This document was prepared using Dragon voice recognition software and may include unintentional dictation errors.    Merlyn Lot,  MD 07/20/18 1630

## 2018-07-20 NOTE — H&P (Signed)
[facility] [place] NAME: Cody Day    MR#:  [id number]  DATE OF BIRTH:  29-Jul-1956  DATE OF ADMISSION:  07/20/2018  PRIMARY CARE PHYSICIAN: [name], MD   REQUESTING/REFERRING PHYSICIAN: Dr. Quentin Cornwall  CHIEF COMPLAINT:   Fall and dizziness HISTORY OF PRESENT ILLNESS:  Cody Day  is a 62 y.o. male with a known history of alcohol abuse, tobacco abuse, COPD/asthma, gout, hypertension comes to the emergency room after he had a near syncopal episode where he was going over to get garbage can from the road this morning and fell into the grass because he got severely lightheaded. He does admit to drinking 4 to 6 cans of beer on a daily basis. He did drink two cans of beer this morning. Patient did not hit his head. He has some pain on his right knee where he had mechanical fall two days ago as well and has a lot scraped his skin. Denies nausea vomiting. He has essential tremors in both his upper extremities. Patient has poor appetite present in the ER.  He was found to have sodium of 126 chloride 95 appears clinically dehydrated and tachycardic also. Patient is being admitted with acute hyponatremia in the setting of alcohol intoxication.  PAST MEDICAL HISTORY:   Past Medical History:  Diagnosis Date  . Achilles tendon rupture 10/11   and repair  . Adenomatous polyp of colon 04/06/2010   Adenomatous polyps 3 in the descending colon, times one in the ascending colon [facility])  . Asthma   . Gout   . Hemorrhoids   . Hypercholesteremia   . Hypertension   . Sleep apnea    doesn't wear CPAP  . Syncope 10/11   in settin gof asthma exacerbation w coughing. Ech (10/11): EF > 55%, mild LVH, grade I diastolic dysfunction, nomral RV size and systolic function,. normal valves. Carotid US (10/11): minimal disease    PAST SURGICAL HISTOIRY:   Past Surgical History:  Procedure Laterality Date  . ABDOMINAL HERNIA REPAIR   1998,2000  . calcium kidney stones    . [facility], [name], [place]; Dr. [name]. Multiple adenomatous polyps (total 4).   . COLONOSCOPY WITH PROPOFOL N/A 08/30/2015   Procedure: COLONOSCOPY WITH PROPOFOL;  Surgeon: [name], MD;  Location: [facility] ENDOSCOPY;  Service: Endoscopy;  Laterality: N/A;  . FEMORAL HERNIA REPAIR  2007  . HEMORRHOID SURGERY    . HERNIA REPAIR  09/10/2010   Repair of recurrent ventral hernia, resection of previously placed mesh x2, implantation 7.5 x 10 cm Gore-Tex dual mesh in an underlay position, repair of epigastric hernia with a 4.2 cm Proceed ventral patch.  Marland Kitchen HERNIA REPAIR   02//28/2007   Laparoscopic right inguinal hernia repair with Surgipro mesh, Ventrilex patch at umbilicus  . HERNIA REPAIR  11/01/1990  .   Small oval Kugel patch placed in preperitoneal space, [name], MD  . HERNIA REPAIR  01/17/1997   Recurrent ventral hernia 15 x 19 Gore-Tex dual mesh placed laparoscopically with multiple lefft--sided ports, [name], MD  . HERNIA REPAIR  01/07/1995    Primary repair of ventral hernia. [name], MD  . intestinal blockage     as a child  . KIDNEY STONE extraction     unic acid stones  . left heart cath  2008   minimal luminal irregularities EF  55%  . RIGHT/LEFT HEART CATH AND CORONARY ANGIOGRAPHY N/A 01/13/2017   Procedure: Right/Left Heart Cath and Coronary Angiography;  Surgeon: [name], MD;  Location: [facility];  Service: Cardiovascular;  Laterality: N/A;  . treadmill stress test  2003    SOCIAL HISTORY:   Social History   Tobacco Use  . Smoking status: Former Smoker    Packs/day: 0.25    Years: 35.00    Pack years: 8.75    Types: Cigarettes    Last attempt to quit: 10/22/1995    Years since quitting: 22.7  . Smokeless tobacco: Current User    Types: Chew  . Tobacco comment: rarely smoked cigarettes and cigars.  states he smoked  drugs mostly  Substance Use Topics  . Alcohol use: Yes    Alcohol/week: 0.0 standard drinks    Comment: 6 beers daily; used to use cocaine and methamphetamine    FAMILY HISTORY:   Family History  Problem Relation Age of Onset  . Hypertension Father   . Cataracts Father   . Diabetes Father   . Heart attack Mother 48  . Coronary artery disease Mother   . Dementia Mother   . Hypertension Sister   . Depression Sister   . Hypertension Brother   . Colon cancer Brother   . Diabetes Other        1st degree relative  . Colon cancer Maternal Aunt     DRUG ALLERGIES:   Allergies  Allergen Reactions  . Levaquin [Levofloxacin In D5w]     Severe headaches, skin tingling/redness  . Penicillins Hives and Rash    Has patient had a PCN reaction causing immediate rash, facial/tongue/throat swelling, SOB or lightheadedness with hypotension: Yes Has patient had a PCN reaction causing severe rash involving mucus membranes or skin necrosis: Unknown Has patient had a PCN reaction that required hospitalization: No Has patient had a PCN reaction occurring within the last 10 years: No - Childhood reaction If all of the above answers are "NO", then may proceed with Cephalosporin use.     REVIEW OF SYSTEMS:  Review of Systems  Constitutional: Negative for chills, fever and weight loss.  HENT: Negative for ear discharge, ear pain and nosebleeds.   Eyes: Negative for blurred vision, pain and discharge.  Respiratory: Negative for sputum production, shortness of breath, wheezing and stridor.   Cardiovascular: Negative for chest pain, palpitations, orthopnea and PND.  Gastrointestinal: Negative for abdominal pain, diarrhea, nausea and vomiting.  Genitourinary: Negative for frequency and urgency.  Musculoskeletal: Positive for falls and joint pain. Negative for back pain.  Neurological: Positive for dizziness and weakness. Negative for sensory change, speech change and focal weakness.   Psychiatric/Behavioral: Negative for depression and hallucinations. The patient is not nervous/anxious.      MEDICATIONS AT HOME:   Prior to Admission medications   Medication Sig Start Date End Date Taking? Authorizing Provider  albuterol (PROVENTIL HFA;VENTOLIN HFA) 108 (90 Base) MCG/ACT inhaler Inhale 2 puffs into the lungs every 6 (six) hours as needed for wheezing or shortness of breath. 04/26/16  Yes Juanito Doom, MD  albuterol (PROVENTIL) (2.5 MG/3ML) 0.083% nebulizer solution Take 3 mLs (2.5 mg total) by nebulization every 4 (four) hours as needed for wheezing or shortness of breath. 04/17/18  Yes Bedsole, Amy E, MD  allopurinol (ZYLOPRIM) 300 MG tablet Take 1 tablet (300 mg total) by mouth daily. 06/09/18  Yes Bedsole, Amy E, MD  Cholecalciferol (VITAMIN D3) 50000 units CAPS Take 1  capsule by mouth once a week. 06/09/18  Yes Bedsole, Amy E, MD  fluticasone (FLOVENT HFA) 220 MCG/ACT inhaler Inhale 2 puffs into the lungs 2 (two) times daily.   Yes [provider]  lisinopril (PRINIVIL,ZESTRIL) 20 MG tablet TAKE ONE (1) TABLET EACH DAY 06/12/18  Yes Bedsole, Amy E, MD  montelukast (SINGULAIR) 10 MG tablet Take 1 tablet (10 mg total) by mouth daily. 06/09/18 06/09/19 Yes Bedsole, Amy E, MD  simvastatin (ZOCOR) 20 MG tablet TAKE ONE TABLET EACH EVENING AS DIRECTED Patient taking differently: Take 20 mg by mouth every evening.  09/03/16  Yes Bedsole, Amy E, MD  Tiotropium Bromide-Olodaterol (STIOLTO RESPIMAT) 2.5-2.5 MCG/ACT AERS Inhale 2 puffs into the lungs daily.   Yes [provider]  testosterone cypionate (DEPOTESTOTERONE CYPIONATE) 100 MG/ML injection Inject 100 mg into the muscle every 7 (seven) days. On Fridays only uses 1/2 ml  For IM use only    [provider]      VITAL SIGNS:  Blood pressure 126/79, pulse 91, temperature 98 F (36.7 C), temperature source Oral, resp. rate 19, height 5\' 11"  (1.803 m), weight 100.7 kg, SpO2 98 %.  PHYSICAL  EXAMINATION:  GENERAL:  62 y.o.-year-old patient lying in the bed with no acute distress.  EYES: Pupils equal, round, reactive to light and accommodation. No scleral icterus. Extraocular muscles intact.  HEENT: Head atraumatic, normocephalic. Oropharynx and nasopharynx clear.  NECK:  Supple, no jugular venous distention. No thyroid enlargement, no tenderness.  LUNGS: Normal breath sounds bilaterally, no wheezing, rales,rhonchi or crepitation. No use of accessory muscles of respiration.  CARDIOVASCULAR: S1, S2 normal. No murmurs, rubs, or gallops.  ABDOMEN: Soft, nontender, nondistended. Bowel sounds present. No organomegaly or mass.  EXTREMITIES: No pedal edema, cyanosis, or clubbing. pt has abrasion over the right knee. NEUROLOGIC: Cranial nerves II through XII are intact. Muscle strength 5/5 in all extremities. Sensation intact. Gait not checked.  PSYCHIATRIC: The patient is alert and oriented x 3.  SKIN: No obvious rash, lesion, or ulcer.   LABORATORY PANEL:   CBC Recent Labs  Lab 07/20/18 1353  WBC 4.7  HGB 14.3  HCT 40.7  PLT 175   ------------------------------------------------------------------------------------------------------------------  Chemistries  Recent Labs  Lab 07/20/18 1353  NA 126*  K 3.7  CL 95*  CO2 15*  GLUCOSE 149*  BUN <5*  CREATININE 0.61  CALCIUM 8.6*   ------------------------------------------------------------------------------------------------------------------  Cardiac Enzymes No results for input(s): TROPONINI in the last 168 hours. ------------------------------------------------------------------------------------------------------------------  RADIOLOGY:  Dg Chest 2 View  Result Date: 07/20/2018 CLINICAL DATA:  Dizziness, fall EXAM: CHEST - 2 VIEW COMPARISON:  01/10/2017 FINDINGS: The heart size and mediastinal contours are within normal limits. Both lungs are clear. The visualized skeletal structures are unremarkable. Normal heart  size and vascularity. Trachea is midline. Slightly lower lung volumes. Degenerative changes of the spine. IMPRESSION: Lower lung volumes without acute chest process Electronically Signed   By: Jerilynn Mages.  Shick M.D.   On: 07/20/2018 15:06   Dg Knee Complete 4 Views Right  Result Date: 07/20/2018 CLINICAL DATA:  [place] landing on the right knee.  Subsequent pain. EXAM: RIGHT KNEE - COMPLETE 4+ VIEW COMPARISON:  None. FINDINGS: No visible joint effusion. No evidence of fracture or dislocation. No significant degenerative changes. Regional vascular calcification incidentally noted. IMPRESSION: No acute or traumatic finding. Electronically Signed   By: Nelson Chimes M.D.   On: 07/20/2018 15:23    EKG:    IMPRESSION AND PLAN:   Cody Day  is  a 62 y.o. male with a known history of alcohol abuse, tobacco abuse, COPD/asthma, gout, hypertension comes to the emergency room after he had a near syncopal episode where he was going over to get garbage can from the road this morning and fell into the grass because he got severely lightheaded. He does admit to drinking 4 to 6 cans of beer on a daily basis. He did drink two cans of beer this morning. Patient did not hit his head. He has some pain on his right knee where he had mechanical fall two days ago as well and has a lot scraped his skin. Denies nausea vomiting. He has essential tremors in both his upper extremities. Patient has poor appetite present in the ER.  1. acute hyponatremia in the setting of alcohol intoxication along with poor PO intake -appears to be possible Beer potomania -IV fluids -monitor metabolic panel Q8 hourly -monitor input and output -nephrology consultation if needed  2. Acidosis -suspected due to alcohol intoxication and acidosis -patient does not have fever, white count or any source of infection. Will monitor lactic acid with IV hydration  3. COPD with ongoing tobacco abuse counsel smoking cessation four minutes spent -nebulizer and  inhalers  4. Alcohol intoxication with alcohol abuse -ciwa protocol  5. DVT prophylaxis subcu Lovenox  6. Hyperglycemia -no history of diabetes. Check hemoglobin A1c -consider starting treatment if A1c is elevated  Discuss with patient and son  All the records are reviewed and case discussed with ED provider. Management plans discussed with the patient, family and they are in agreement.  CODE STATUS: full  TOTAL TIME TAKING CARE OF THIS PATIENT: 50 minutes.    Fritzi Mandes M.D on 07/20/2018 at 5:28 PM  Between 7am to 6pm - Pager - [phone]  After 6pm go to [email or web address] - password [email or web address] [facility]  [facility]  Office  [phone]  CC: Primary care physician; [name], MD

## 2018-07-20 NOTE — ED Notes (Signed)
Pt also reports daily etoh use had 2 beers today.

## 2018-07-20 NOTE — ED Triage Notes (Signed)
Pt arrived via POV with reports of dizziness and fall. Pt states he was pushing a trash can back to the shed and fell onto right side and did not hit his head. Pt fell into grass.  Pt reports lightheadedness. EMS did check out patient at home and informed him he may be dehydrated. Pt alert and oriented in triage.

## 2018-07-21 ENCOUNTER — Inpatient Hospital Stay
Admit: 2018-07-21 | Discharge: 2018-07-21 | Disposition: A | Discharge status: Home / Self Care | Payer: BLUE CROSS/BLUE SHIELD | Attending: Internal Medicine | Admitting: Internal Medicine

## 2018-07-21 DIAGNOSIS — R55 Syncope and collapse: Secondary | ICD-10-CM

## 2018-07-21 LAB — BASIC METABOLIC PANEL
Anion gap: 6 (ref 5–15)
Anion gap: 7 (ref 5–15)
Anion gap: 8 (ref 5–15)
BUN: <5 mg/dL — ABNORMAL LOW (ref 8–23)
BUN: <5 mg/dL — ABNORMAL LOW (ref 8–23)
BUN: <5 mg/dL — ABNORMAL LOW (ref 8–23)
CO2: 24 mmol/L (ref 22–32)
CO2: 20 mmol/L — ABNORMAL LOW (ref 22–32)
CO2: 22 mmol/L (ref 22–32)
Calcium: 8 mg/dL — ABNORMAL LOW (ref 8.9–10.3)
Calcium: 8.2 mg/dL — ABNORMAL LOW (ref 8.9–10.3)
Calcium: 8 mg/dL — ABNORMAL LOW (ref 8.9–10.3)
Chloride: 100 mmol/L (ref 98–111)
Chloride: 99 mmol/L (ref 98–111)
Chloride: 96 mmol/L — ABNORMAL LOW (ref 98–111)
Creatinine, Ser: 0.42 mg/dL — ABNORMAL LOW (ref 0.61–1.24)
Creatinine, Ser: 0.48 mg/dL — ABNORMAL LOW (ref 0.61–1.24)
Creatinine, Ser: 0.47 mg/dL — ABNORMAL LOW (ref 0.61–1.24)
GFR calc Af Amer: >60 mL/min (ref >60)
GFR calc Af Amer: >60 mL/min (ref >60)
GFR calc Af Amer: >60 mL/min (ref >60)
GFR calc non Af Amer: >60 mL/min (ref >60)
GFR calc non Af Amer: >60 mL/min (ref >60)
GFR calc non Af Amer: >60 mL/min (ref >60)
Glucose, Bld: 82 mg/dL (ref 70–99)
Glucose, Bld: 201 mg/dL — ABNORMAL HIGH (ref 70–99)
Glucose, Bld: 120 mg/dL — ABNORMAL HIGH (ref 70–99)
Potassium: 3.3 mmol/L — ABNORMAL LOW (ref 3.5–5.1)
Potassium: 3.5 mmol/L (ref 3.5–5.1)
Potassium: 3.5 mmol/L (ref 3.5–5.1)
Sodium: 130 mmol/L — ABNORMAL LOW (ref 135–145)
Sodium: 126 mmol/L — ABNORMAL LOW (ref 135–145)
Sodium: 126 mmol/L — ABNORMAL LOW (ref 135–145)

## 2018-07-21 LAB — MAGNESIUM: Magnesium: 1.6 mg/dL — ABNORMAL LOW (ref 1.7–2.4)

## 2018-07-21 LAB — PHOSPHORUS: Phosphorus: 1.7 mg/dL — ABNORMAL LOW (ref 2.5–4.6)

## 2018-07-21 LAB — ECHOCARDIOGRAM COMPLETE
Height: 71 in
Weight: 3265.6 oz

## 2018-07-21 LAB — HEMOGLOBIN A1C
Hgb A1c MFr Bld: 5.7 % — ABNORMAL HIGH (ref 4.8–5.6)
Mean Plasma Glucose: 117 mg/dL

## 2018-07-21 LAB — LACTIC ACID, PLASMA: Lactic Acid, Venous: 2.2 mmol/L (ref 0.5–1.9)

## 2018-07-21 MED ORDER — OXYCODONE HCL 5 MG PO TABS
5.0000 mg | ORAL_TABLET | Freq: Once | ORAL | Status: AC
  Administered 2018-07-21: 06:00:00 10 mg via ORAL
  Filled 2018-07-21: qty 2

## 2018-07-21 MED ORDER — LISINOPRIL 20 MG PO TABS
20.0000 mg | ORAL_TABLET | Freq: Every day | ORAL | Status: DC
  Administered 2018-07-21 – 2018-07-22 (×2): 20 mg via ORAL
  Filled 2018-07-21 (×2): qty 1

## 2018-07-21 MED ORDER — MAGNESIUM SULFATE 4 GM/100ML IV SOLN
4.0000 g | Freq: Once | INTRAVENOUS | Status: AC
  Administered 2018-07-21: 4 g via INTRAVENOUS
  Filled 2018-07-21: qty 100

## 2018-07-21 MED ORDER — POTASSIUM CHLORIDE CRYS ER 20 MEQ PO TBCR
40.0000 meq | EXTENDED_RELEASE_TABLET | Freq: Once | ORAL | Status: AC
  Administered 2018-07-21: 14:00:00 40 meq via ORAL
  Filled 2018-07-21: qty 2

## 2018-07-21 MED ORDER — SIMVASTATIN 20 MG PO TABS
20.0000 mg | ORAL_TABLET | Freq: Every day | ORAL | Status: DC
  Administered 2018-07-21: 20 mg via ORAL
  Filled 2018-07-21: qty 1

## 2018-07-21 MED ORDER — PERFLUTREN LIPID MICROSPHERE
1.0000 mL | INTRAVENOUS | Status: AC | PRN
  Administered 2018-07-21: 2 mL via INTRAVENOUS
  Filled 2018-07-21: qty 10

## 2018-07-21 MED ORDER — TIOTROPIUM BROMIDE MONOHYDRATE 18 MCG IN CAPS
18.0000 ug | ORAL_CAPSULE | Freq: Every day | RESPIRATORY_TRACT | Status: DC
  Administered 2018-07-21 – 2018-07-22 (×2): 18 ug via RESPIRATORY_TRACT
  Filled 2018-07-21: qty 5

## 2018-07-21 MED ORDER — ENOXAPARIN SODIUM 40 MG/0.4ML ~~LOC~~ SOLN
40.0000 mg | SUBCUTANEOUS | Status: DC
  Administered 2018-07-21: 40 mg via SUBCUTANEOUS
  Filled 2018-07-21: qty 0.4

## 2018-07-21 MED ORDER — INFLUENZA VAC SPLIT QUAD 0.5 ML IM SUSY
0.5000 mL | PREFILLED_SYRINGE | INTRAMUSCULAR | Status: AC
  Administered 2018-07-21: 12:00:00 0.5 mL via INTRAMUSCULAR
  Filled 2018-07-21: qty 0.5

## 2018-07-21 MED ORDER — MAGNESIUM SULFATE 2 GM/50ML IV SOLN: 2.0000 g | Freq: Once | INTRAVENOUS | Status: DC

## 2018-07-21 MED ORDER — POTASSIUM PHOSPHATES 15 MMOLE/5ML IV SOLN
20.0000 mmol | Freq: Once | INTRAVENOUS | Status: AC
  Administered 2018-07-21: 18:00:00 20 mmol via INTRAVENOUS
  Filled 2018-07-21: qty 6.67

## 2018-07-21 NOTE — Progress Notes (Signed)
Physical Therapy Evaluation Patient Details Name: Cody Day MRN: [id number] DOB: 09-19-1956 Today's Date: 07/21/2018   History of Present Illness  Pt presented to ER s/p fall with complaints of dizziness, pt admitted for hyponatremia. PMH includes EOTH and tobacco abuse, COPD/asthma, gout and HTN.  Clinical Impression  Pt is a pleasant 62 year old male who was admitted for hyponatremia. Pt performs bed mobility independently, transfers Mod I, and ambulation with CGA. Pt uses RW for UE for mobility. Per pt no dizziness, but felt unsteady when on his feet, states "my legs feel so much weaker" than normal. Pt demonstrates deficits with UE and LE strength, balance, endurance, and mobility. Pt is not at his baseline. Would benefit from skilled PT to address above deficits and promote optimal return to PLOF.      Follow Up Recommendations Home health PT    Equipment Recommendations  Rolling walker with 5" wheels    Recommendations for Other Services       Precautions / Restrictions Precautions Precautions: Fall Restrictions Weight Bearing Restrictions: No      Mobility  Bed Mobility Overal bed mobility: Independent             General bed mobility comments: No difficulties mobilizing safely in bed  Transfers Overall transfer level: Modified independent Equipment used: Rolling walker (2 wheeled) Transfers: Sit to/from Stand Sit to Stand: Supervision         General transfer comment: Pt has fair anterior translation, slow to activate posterior chain, once in standing tolerates mod weight shift without LOB.  Ambulation/Gait Ambulation/Gait assistance: Min guard Gait Distance (Feet): 160 Feet Assistive device: Rolling walker (2 wheeled) Gait Pattern/deviations: Step-through pattern Gait velocity: Decreased   General Gait Details: Pt amb with reciprocal pattern with slight decreased step length, fair heel strike and foot clearance. Pt fatigued at about 80 feet  requiring short rest break (< 88min)  Stairs            Wheelchair Mobility    Modified Rankin (Stroke Patients Only)       Balance Overall balance assessment: Needs assistance Sitting-balance support: Feet supported;No upper extremity supported Sitting balance-Leahy Scale: Good     Standing balance support: Bilateral upper extremity supported Standing balance-Leahy Scale: Fair Standing balance comment: Pt slightly unsteady on feet in standing, no apparent LOB.                             Pertinent Vitals/Pain Pain Assessment: No/denies pain    Home Living Family/patient expects to be discharged to:: Private residence Living Arrangements: Spouse/significant other(Wife) Available Help at Discharge: Family;Available PRN/intermittently Type of Home: Mobile home Home Access: Stairs to enter Entrance Stairs-Rails: Right;Left;Can reach both Entrance Stairs-Number of Steps: 9 Home Layout: One level Home Equipment: Grab bars - tub/shower      Prior Function Level of Independence: Independent         Comments: Per pt they are independent with all mobility without AD. He no longer drives, wife provides transportation prn.     Hand Dominance        Extremity/Trunk Assessment   Upper Extremity Assessment Upper Extremity Assessment: Generalized weakness(BUE 4+/5)    Lower Extremity Assessment Lower Extremity Assessment: Generalized weakness(BLE 4-/5)       Communication   Communication: No difficulties  Cognition Arousal/Alertness: Awake/alert Behavior During Therapy: WFL for tasks assessed/performed Overall Cognitive Status: Within Functional Limits for tasks assessed  General Comments: Pt follows commands consistantly      General Comments      Exercises Other Exercises Other Exercises: Seated AROM BLE LAQs, marching, heel slides, hip ABD/ADD. All ther-ex performed x10 reps with VC for  technique.   Assessment/Plan    PT Assessment Patient needs continued PT services  PT Problem List Decreased strength;Decreased activity tolerance;Decreased balance;Decreased mobility       PT Treatment Interventions Gait training;Stair training;Functional mobility training;Therapeutic activities;Therapeutic exercise;Balance training;Neuromuscular re-education;Patient/family education;DME instruction    PT Goals (Current goals can be found in the Care Plan section)  Acute Rehab PT Goals Patient Stated Goal: To strengthen my legs so I can walk normally again and go home. PT Goal Formulation: With patient Time For Goal Achievement: 08/04/18 Potential to Achieve Goals: Good    Frequency Min 2X/week   Barriers to discharge        Co-evaluation               AM-PAC PT "6 Clicks" Daily Activity  Outcome Measure Difficulty turning over in bed (including adjusting bedclothes, sheets and blankets)?: None Difficulty moving from lying on back to sitting on the side of the bed? : None Difficulty sitting down on and standing up from a chair with arms (e.g., wheelchair, bedside commode, etc,.)?: A Little Help needed moving to and from a bed to chair (including a wheelchair)?: A Little Help needed walking in hospital room?: A Little Help needed climbing 3-5 steps with a railing? : A Little 6 Click Score: 20    End of Session Equipment Utilized During Treatment: Gait belt Activity Tolerance: Patient tolerated treatment well Patient left: in bed;with call bell/phone within reach;with bed alarm set Nurse Communication: Mobility status PT Visit Diagnosis: Unsteadiness on feet (R26.81);Muscle weakness (generalized) (M62.81);History of falling (Z91.81)    Time: 0258-5277 PT Time Calculation (min) (ACUTE ONLY): 27 min   Charges:              Algis Downs, SPT   Algis Downs 07/21/2018, 12:38 PM

## 2018-07-21 NOTE — Progress Notes (Signed)
*  PRELIMINARY RESULTS* Echocardiogram 2D Echocardiogram has been performed.  Cody Day 07/21/2018, 11:29 AM

## 2018-07-21 NOTE — Progress Notes (Signed)
Pt high fall risk, history of multiple falls, chronic tremmors mulitple high risk medications. Upon escorting pt. to bathroom pt refused for RN, myself, to remain in bathroom within arms length. Pt educated as to rationale and safety benefits of having RN remain within arms reach. Pt refusal stands. RN, remained outside of bathroom door following second refusal.

## 2018-07-21 NOTE — Progress Notes (Signed)
[place] at [facility]                                                                                                                                                                                  Patient Demographics   Cody Day, is a 62 y.o. male, DOB - May 24, 1956, YQM:[id number]  Admit date - 07/20/2018   Admitting Physician Fritzi Mandes, MD  Outpatient Primary MD for the patient is [name], MD   LOS - 1  Subjective: Patient admitted after fall and dizziness With alcohol abuse Patient states that he does not walk much He reports chronic tremor  Review of Systems:   CONSTITUTIONAL: No documented fever. No fatigue, weakness. No weight gain, no weight loss.  EYES: No blurry or double vision.  ENT: No tinnitus. No postnasal drip. No redness of the oropharynx.  RESPIRATORY: No cough, no wheeze, no hemoptysis. No dyspnea.  CARDIOVASCULAR: No chest pain. No orthopnea. No palpitations. No syncope.  GASTROINTESTINAL: No nausea, no vomiting or diarrhea. No abdominal pain. No melena or hematochezia.  GENITOURINARY: No dysuria or hematuria.  ENDOCRINE: No polyuria or nocturia. No heat or cold intolerance.  HEMATOLOGY: No anemia. No bruising. No bleeding.  INTEGUMENTARY: No rashes. No lesions.  MUSCULOSKELETAL: No arthritis. No swelling. No gout.  NEUROLOGIC: No numbness, tingling, or ataxia. No seizure-type activity. + tremmor PSYCHIATRIC: No anxiety. No insomnia. No ADD.    Vitals:   Vitals:   07/20/18 1901 07/20/18 2340 07/21/18 0152 07/21/18 0528  BP: 136/84 (!) 142/90  139/89  Pulse: (!) 110 (!) 102 (!) 118 98  Resp: 18     Temp: 97.9 F (36.6 C)   98.3 F (36.8 C)  TempSrc: Oral   Oral  SpO2: 100%     Weight: 92.6 kg     Height: 5\' 11"  (1.803 m)       Wt Readings from Last 3 Encounters:  07/20/18 92.6 kg  04/17/18 99.8 kg  12/09/17 103.3 kg     Intake/Output Summary (Last 24 hours) at 07/21/2018 1203 Last data filed at  07/21/2018 1023 Gross per 24 hour  Intake 3118.24 ml  Output -  Net 3118.24 ml    Physical Exam:   GENERAL: Pleasant-appearing in no apparent distress.  HEAD, EYES, EARS, NOSE AND THROAT: Atraumatic, normocephalic. Extraocular muscles are intact. Pupils equal and reactive to light. Sclerae anicteric. No conjunctival injection. No oro-pharyngeal erythema.  NECK: Supple. There is no jugular venous distention. No bruits, no lymphadenopathy, no thyromegaly.  HEART: Regular rate and rhythm,. No murmurs, no rubs, no clicks.  LUNGS: Clear to auscultation bilaterally. No rales or rhonchi. No wheezes.  ABDOMEN: Soft,  flat, nontender, nondistended. Has good bowel sounds. No hepatosplenomegaly appreciated.  EXTREMITIES: No evidence of any cyanosis, clubbing, or peripheral edema.  +2 pedal and radial pulses bilaterally.  NEUROLOGIC: The patient is alert, awake, and oriented x3 with no focal motor or sensory deficits appreciated bilaterally.  Patient has tremors SKIN: Moist and warm with no rashes appreciated.  Psych: Not anxious, depressed LN: No inguinal LN enlargement    Antibiotics   Anti-infectives (From admission, onward)   None      Medications   Scheduled Meds: . allopurinol  300 mg Oral Daily  . enoxaparin (LOVENOX) injection  40 mg Subcutaneous Q24H  . folic acid  1 mg Oral Daily  . LORazepam  0-4 mg Oral Q6H   Followed by  . [START ON 07/22/2018] LORazepam  0-4 mg Oral Q12H  . montelukast  10 mg Oral Daily  . multivitamin with minerals  1 tablet Oral Daily  . potassium chloride  40 mEq Oral Once  . sodium chloride flush  3 mL Intravenous Q12H  . thiamine  100 mg Oral Daily   Or  . thiamine  100 mg Intravenous Daily  . [START ON 07/27/2018] Vitamin D (Ergocalciferol)  50,000 Units Oral Weekly   Continuous Infusions: . sodium chloride 100 mL/hr at 07/21/18 0530  . sodium chloride     PRN Meds:.sodium chloride, acetaminophen **OR** acetaminophen, albuterol, LORazepam **OR**  LORazepam, ondansetron **OR** ondansetron (ZOFRAN) IV, senna-docusate, sodium chloride flush   Data Review:   Micro Results No results found for this or any previous visit (from the past 240 hour(s)).  Radiology Reports Dg Chest 2 View  Result Date: 07/20/2018 CLINICAL DATA:  Dizziness, fall EXAM: CHEST - 2 VIEW COMPARISON:  01/10/2017 FINDINGS: The heart size and mediastinal contours are within normal limits. Both lungs are clear. The visualized skeletal structures are unremarkable. Normal heart size and vascularity. Trachea is midline. Slightly lower lung volumes. Degenerative changes of the spine. IMPRESSION: Lower lung volumes without acute chest process Electronically Signed   By: Jerilynn Mages.  Shick M.D.   On: 07/20/2018 15:06   Dg Knee Complete 4 Views Right  Result Date: 07/20/2018 CLINICAL DATA:  [place] landing on the right knee.  Subsequent pain. EXAM: RIGHT KNEE - COMPLETE 4+ VIEW COMPARISON:  None. FINDINGS: No visible joint effusion. No evidence of fracture or dislocation. No significant degenerative changes. Regional vascular calcification incidentally noted. IMPRESSION: No acute or traumatic finding. Electronically Signed   By: Nelson Chimes M.D.   On: 07/20/2018 15:23     CBC Recent Labs  Lab 07/20/18 1353 07/20/18 2033  WBC 4.7 5.1  HGB 14.3 12.9*  HCT 40.7 36.5*  PLT 175 145*  MCV 94.8 93.8  MCH 33.2 33.0  MCHC 35.1 35.2  RDW 15.7* 16.0*    Chemistries  Recent Labs  Lab 07/20/18 1353 07/20/18 2033 07/21/18 0433  NA 126* 130* 130*  K 3.7 3.3* 3.3*  CL 95* 99 100  CO2 15* 24 24  GLUCOSE 149* 113* 82  BUN <5* <5* <5*  CREATININE 0.61 0.60* 0.42*  CALCIUM 8.6* 8.3* 8.0*  MG 1.6*  --   --   AST 115*  --   --   ALT 66*  --   --   ALKPHOS 79  --   --   BILITOT 1.0  --   --    ------------------------------------------------------------------------------------------------------------------ estimated creatinine clearance is 112.7 mL/min (A) (by C-G formula based on  SCr of 0.42 mg/dL (L)). ------------------------------------------------------------------------------------------------------------------ Recent Labs  07/20/18 1747  HGBA1C 5.7*   ------------------------------------------------------------------------------------------------------------------ No results for input(s): CHOL, HDL, LDLCALC, TRIG, CHOLHDL, LDLDIRECT in the last 72 hours. ------------------------------------------------------------------------------------------------------------------ No results for input(s): TSH, T4TOTAL, T3FREE, THYROIDAB in the last 72 hours.  Invalid input(s): FREET3 ------------------------------------------------------------------------------------------------------------------ No results for input(s): VITAMINB12, FOLATE, FERRITIN, TIBC, IRON, RETICCTPCT in the last 72 hours.  Coagulation profile No results for input(s): INR, PROTIME in the last 168 hours.  No results for input(s): DDIMER in the last 72 hours.  Cardiac Enzymes No results for input(s): CKMB, TROPONINI, MYOGLOBIN in the last 168 hours.  Invalid input(s): CK ------------------------------------------------------------------------------------------------------------------ Invalid input(s): [place]   Markeem Noreen  is a 62 y.o. male with a known history of alcohol abuse, tobacco abuse, COPD/asthma, gout, hypertension    1. acute hyponatremia in the setting of alcohol intoxication along with poor PO intake -appears to be possible Beer potomania - continue IV fluids -Sodium improved -monitor input and output -Continue to follow  2. Acidosis -suspected due to alcohol intoxication and acidosis -IV fluid  3. COPD with ongoing tobacco abuse counsel smoking cessation four minutes spent -nebulizer and inhalers  4. Alcohol intoxication with alcohol abuse -ciwa protocol  5. Hyperglycemia -no history of diabetes.  Hemoglobin A1c is 5.7 -Diet and  alcohol cessation recommended  6. K+ replace potassium      Code Status Orders  (From admission, onward)         Start     Ordered   07/20/18 1707  Full code  Continuous     07/20/18 1706        Code Status History    This patient has a current code status but no historical code status.           Consults  none  DVT Prophylaxis  Lovenox   Lab Results  Component Value Date   PLT 145 (L) 07/20/2018     Time Spent in minutes  48min Greater than 50% of time spent in care coordination and counseling patient regarding the condition and plan of care.   Dustin Flock M.D on 07/21/2018 at 12:03 PM  Between 7am to 6pm - Pager - [phone]  After 6pm go to [email or web address] - Proofreader  [employer]   Office  [phone]

## 2018-07-21 NOTE — Plan of Care (Signed)
  Problem: Education: Goal: Knowledge of General Education information will improve Description Including pain rating scale, medication(s)/side effects and non-pharmacologic comfort measures Outcome: Progressing   Problem: Health Behavior/Discharge Planning: Goal: Ability to manage health-related needs will improve Outcome: Progressing Note:  Cont to have some trenors pain in back at times fell prior to coming in. Pt on ativan per ciwa protocol   Problem: Clinical Measurements: Goal: Ability to maintain clinical measurements within normal limits will improve Outcome: Progressing Goal: Will remain free from infection Outcome: Progressing Goal: Diagnostic test results will improve Outcome: Progressing Goal: Respiratory complications will improve Outcome: Progressing Goal: Cardiovascular complication will be avoided Outcome: Progressing   Problem: Activity: Goal: Risk for activity intolerance will decrease Outcome: Progressing   Problem: Nutrition: Goal: Adequate nutrition will be maintained Outcome: Progressing   Problem: Coping: Goal: Level of anxiety will decrease Outcome: Progressing   Problem: Elimination: Goal: Will not experience complications related to bowel motility Outcome: Progressing Goal: Will not experience complications related to urinary retention Outcome: Progressing   Problem: Pain Managment: Goal: General experience of comfort will improve Outcome: Progressing   Problem: Safety: Goal: Ability to remain free from injury will improve Outcome: Progressing   Problem: Skin Integrity: Goal: Risk for impaired skin integrity will decrease Outcome: Progressing

## 2018-07-21 NOTE — Plan of Care (Signed)

## 2018-07-21 NOTE — Progress Notes (Addendum)
Pharmacy Electrolyte Monitoring Consult:  Pharmacy consulted to assist in monitoring and replacing electrolytes in this 62 y.o. male admitted on 07/20/2018 with hypernatremia. Patient on CIWA.   Patient receiving Normal Saline @ 41mL/hr.   Labs:  Sodium (mmol/L)  Date Value  07/21/2018 126 (L)  10/23/2012 136   Potassium (mmol/L)  Date Value  07/21/2018 3.5  10/23/2012 3.4 (L)   Magnesium (mg/dL)  Date Value  07/21/2018 1.6 (L)  10/23/2012 1.8   Phosphorus (mg/dL)  Date Value  07/21/2018 1.7 (L)   Calcium (mg/dL)  Date Value  07/21/2018 8.2 (L)   Calcium, Total (mg/dL)  Date Value  10/23/2012 9.3   Albumin (g/dL)  Date Value  07/20/2018 3.3 (L)  10/23/2012 4.1    Assessment/Plan:  Will order potassium phosphate 13mmol IV x 1 and magnesium 4g IV x 1. Will recheck electrolytes with am labs.   Will replace for goal magnesium ~ 2 and all other electrolyte within normal limits.   Pharmacy will continue to monitor and adjust per consult.   Simpson,Michael L 07/21/2018 5:00 PM

## 2018-07-22 LAB — BASIC METABOLIC PANEL
Anion gap: 4 — ABNORMAL LOW (ref 5–15)
BUN: <5 mg/dL — ABNORMAL LOW (ref 8–23)
CO2: 25 mmol/L (ref 22–32)
Calcium: 8 mg/dL — ABNORMAL LOW (ref 8.9–10.3)
Chloride: 102 mmol/L (ref 98–111)
Creatinine, Ser: 0.43 mg/dL — ABNORMAL LOW (ref 0.61–1.24)
GFR calc Af Amer: >60 mL/min (ref >60)
GFR calc non Af Amer: >60 mL/min (ref >60)
Glucose, Bld: 98 mg/dL (ref 70–99)
Potassium: 3.5 mmol/L (ref 3.5–5.1)
Sodium: 131 mmol/L — ABNORMAL LOW (ref 135–145)

## 2018-07-22 LAB — MAGNESIUM: Magnesium: 2.1 mg/dL (ref 1.7–2.4)

## 2018-07-22 MED ORDER — CITALOPRAM HYDROBROMIDE 20 MG PO TABS: 20.0000 mg | ORAL_TABLET | Freq: Every day | ORAL | 2 refills | Status: DC

## 2018-07-22 MED ORDER — ADULT MULTIVITAMIN W/MINERALS CH: 1.0000 | ORAL_TABLET | Freq: Every day | ORAL | 2 refills | Status: AC

## 2018-07-22 NOTE — Progress Notes (Signed)
Pharmacy Electrolyte Monitoring Consult:  Pharmacy consulted to assist in monitoring and replacing electrolytes in this 62 y.o. male admitted on 07/20/2018 with hypernatremia. Patient on CIWA.   Patient receiving Normal Saline @ 54mL/hr.   Labs:  Sodium (mmol/L)  Date Value  07/22/2018 131 (L)  10/23/2012 136   Potassium (mmol/L)  Date Value  07/22/2018 3.5  10/23/2012 3.4 (L)   Magnesium (mg/dL)  Date Value  07/22/2018 2.1  10/23/2012 1.8   Phosphorus (mg/dL)  Date Value  07/21/2018 1.7 (L)   Calcium (mg/dL)  Date Value  07/22/2018 8.0 (L)   Calcium, Total (mg/dL)  Date Value  10/23/2012 9.3   Albumin (g/dL)  Date Value  07/20/2018 3.3 (L)  10/23/2012 4.1    Assessment/Plan:  K 3.5, Mag 2.1 - no supp needed Pt being discharged  Pharmacy will continue to monitor and adjust per consult.   Cody Day 07/22/2018 1:53 PM

## 2018-07-22 NOTE — Progress Notes (Signed)
Pt discharged via wheelchair by auxillary to the visitor's entrance

## 2018-07-22 NOTE — Discharge Summary (Signed)
[place] at [place] NAME: Cody Day    MR#:  [id number]  DATE OF BIRTH:  11-Jan-1956  DATE OF ADMISSION:  07/20/2018   ADMITTING PHYSICIAN: Fritzi Mandes, MD  DATE OF DISCHARGE: 07/22/18  PRIMARY CARE PHYSICIAN: [name], MD   ADMISSION DIAGNOSIS:   Syncope and collapse [R55] Dehydration [E86.0] Lactic acidosis [E87.2]  DISCHARGE DIAGNOSIS:   Active Problems:   Hyponatremia   SECONDARY DIAGNOSIS:   Past Medical History:  Diagnosis Date  . Achilles tendon rupture 10/11   and repair  . Adenomatous polyp of colon 04/06/2010   Adenomatous polyps 3 in the descending colon, times one in the ascending colon [facility])  . Asthma   . Gout   . Hemorrhoids   . Hypercholesteremia   . Hypertension   . Sleep apnea    doesn't wear CPAP  . Syncope 10/11   in settin gof asthma exacerbation w coughing. Ech (10/11): EF > 55%, mild LVH, grade I diastolic dysfunction, nomral RV size and systolic function,. normal valves. Carotid US (10/11): minimal disease    HOSPITAL COURSE:   62 year old male with past medical history significant for smoking and alcohol abuse, COPD, gout and hypertension presents to hospital secondary to dizziness and falls.  1.  Hyponatremia-secondary to beer portal mania, poor oral intake -Received fluids.  Improving sodium level, now at 131.  2.  Acute alcohol intoxication-alcohol level was 243 on admission. -Counseled.  Also discussed the same with his wife.  Watched for any withdrawals.  Not in acute DTs. -Received Ativan as needed in the hospital  3.  Depression-generally low mood, according to wife that despite he is drinking more. -Started on Celexa 20 mg.  Will need outpatient follow-up.  If dose needs to be adjusted  4.  COPD-stable.  Continue home inhalers  5.  Hypertension-on lisinopril  Worked with physical therapy.  Will be discharged with home health.  DISCHARGE CONDITIONS:    Guarded  CONSULTS OBTAINED:   None  DRUG ALLERGIES:   Allergies  Allergen Reactions  . Levaquin [Levofloxacin In D5w]     Severe headaches, skin tingling/redness  . Penicillins Hives and Rash    Has patient had a PCN reaction causing immediate rash, facial/tongue/throat swelling, SOB or lightheadedness with hypotension: Yes Has patient had a PCN reaction causing severe rash involving mucus membranes or skin necrosis: Unknown Has patient had a PCN reaction that required hospitalization: No Has patient had a PCN reaction occurring within the last 10 years: No - Childhood reaction If all of the above answers are "NO", then may proceed with Cephalosporin use.    DISCHARGE MEDICATIONS:   Allergies as of 07/22/2018      Reactions   Levaquin [levofloxacin In D5w]    Severe headaches, skin tingling/redness   Penicillins Hives, Rash   Has patient had a PCN reaction causing immediate rash, facial/tongue/throat swelling, SOB or lightheadedness with hypotension: Yes Has patient had a PCN reaction causing severe rash involving mucus membranes or skin necrosis: Unknown Has patient had a PCN reaction that required hospitalization: No Has patient had a PCN reaction occurring within the last 10 years: No - Childhood reaction If all of the above answers are "NO", then may proceed with Cephalosporin use.      Medication List    TAKE these medications   albuterol 108 (90 Base) MCG/ACT inhaler Commonly known as:  PROVENTIL HFA;VENTOLIN HFA Inhale 2 puffs into the lungs every 6 (six)  hours as needed for wheezing or shortness of breath.   albuterol (2.5 MG/3ML) 0.083% nebulizer solution Commonly known as:  PROVENTIL Take 3 mLs (2.5 mg total) by nebulization every 4 (four) hours as needed for wheezing or shortness of breath.   allopurinol 300 MG tablet Commonly known as:  ZYLOPRIM Take 1 tablet (300 mg total) by mouth daily.   citalopram 20 MG tablet Commonly known as:  CELEXA Take 1  tablet (20 mg total) by mouth daily.   fluticasone 220 MCG/ACT inhaler Commonly known as:  FLOVENT HFA Inhale 2 puffs into the lungs 2 (two) times daily.   lisinopril 20 MG tablet Commonly known as:  PRINIVIL,ZESTRIL TAKE ONE (1) TABLET EACH DAY   montelukast 10 MG tablet Commonly known as:  SINGULAIR Take 1 tablet (10 mg total) by mouth daily.   multivitamin with minerals Tabs tablet Take 1 tablet by mouth daily.   simvastatin 20 MG tablet Commonly known as:  ZOCOR TAKE ONE TABLET EACH EVENING AS DIRECTED What changed:  See the new instructions.   STIOLTO RESPIMAT 2.5-2.5 MCG/ACT Aers Generic drug:  Tiotropium Bromide-Olodaterol Inhale 2 puffs into the lungs daily.   testosterone cypionate 100 MG/ML injection Commonly known as:  DEPOTESTOTERONE CYPIONATE Inject 100 mg into the muscle every 7 (seven) days. On Fridays only uses 1/2 ml  For IM use only   Vitamin D3 50000 units Caps Take 1 capsule by mouth once a week.        DISCHARGE INSTRUCTIONS:   1. PCP f/u in 1-2 weeks  DIET:   Cardiac diet  ACTIVITY:   Activity as tolerated  OXYGEN:   Home Oxygen: No.  Oxygen Delivery: room air  DISCHARGE LOCATION:   home   If you experience worsening of your admission symptoms, develop shortness of breath, life threatening emergency, suicidal or homicidal thoughts you must seek medical attention immediately by calling 911 or calling your MD immediately  if symptoms less severe.  You Must read complete instructions/literature along with all the possible adverse reactions/side effects for all the Medicines you take and that have been prescribed to you. Take any new Medicines after you have completely understood and accpet all the possible adverse reactions/side effects.   Please note  You were cared for by a hospitalist during your hospital stay. If you have any questions about your discharge medications or the care you received while you were in the hospital after  you are discharged, you can call the unit and asked to speak with the hospitalist on call if the hospitalist that took care of you is not available. Once you are discharged, your primary care physician will handle any further medical issues. Please note that NO REFILLS for any discharge medications will be authorized once you are discharged, as it is imperative that you return to your primary care physician (or establish a relationship with a primary care physician if you do not have one) for your aftercare needs so that they can reassess your need for medications and monitor your lab values.    On the day of Discharge:  VITAL SIGNS:   Blood pressure (!) 135/92, pulse 72, temperature 97.9 F (36.6 C), temperature source Oral, resp. rate 18, height 5\' 11"  (1.803 m), weight 92.6 kg, SpO2 99 %.  PHYSICAL EXAMINATION:    GENERAL:  61 y.o.-year-old patient lying in the bed with no acute distress.  EYES: Pupils equal, round, reactive to light and accommodation. No scleral icterus. Extraocular muscles intact.  HEENT: Head  atraumatic, normocephalic. Oropharynx and nasopharynx clear.  NECK:  Supple, no jugular venous distention. No thyroid enlargement, no tenderness.  LUNGS: Normal breath sounds bilaterally, no wheezing, rales,rhonchi or crepitation. No use of accessory muscles of respiration.  CARDIOVASCULAR: S1, S2 normal. No rubs, or gallops.  2/6 systolic murmur is present ABDOMEN: Soft, non-tender, non-distended. Bowel sounds present. No organomegaly or mass.  EXTREMITIES: No pedal edema, cyanosis, or clubbing.  Fine tremors of both his upper extremities NEUROLOGIC: Cranial nerves II through XII are intact. Muscle strength 5/5 in all extremities. Sensation intact. Gait not checked.  PSYCHIATRIC: The patient is alert and oriented x 3.  Poor cognition SKIN: No obvious rash, lesion, or ulcer.   DATA REVIEW:   CBC Recent Labs  Lab 07/20/18 2033  WBC 5.1  HGB 12.9*  HCT 36.5*  PLT 145*     Chemistries  Recent Labs  Lab 07/20/18 1353  07/22/18 0318  NA 126*   < > 131*  K 3.7   < > 3.5  CL 95*   < > 102  CO2 15*   < > 25  GLUCOSE 149*   < > 98  BUN <5*   < > <5*  CREATININE 0.61   < > 0.43*  CALCIUM 8.6*   < > 8.0*  MG 1.6*   < > 2.1  AST 115*  --   --   ALT 66*  --   --   ALKPHOS 79  --   --   BILITOT 1.0  --   --    < > = values in this interval not displayed.     Microbiology Results  No results found for this or any previous visit.  RADIOLOGY:  No results found.   Management plans discussed with the patient, family and they are in agreement.  CODE STATUS:     Code Status Orders  (From admission, onward)         Start     Ordered   07/20/18 1707  Full code  Continuous     07/20/18 1706        Code Status History    This patient has a current code status but no historical code status.      TOTAL TIME TAKING CARE OF THIS PATIENT: 38 minutes.    Gladstone Lighter M.D on 07/22/2018 at 10:57 AM  Between 7am to 6pm - Pager - [phone]  After 6pm go to [email or web address] - Proofreader  [employer] [place]  Office  [phone]  CC: Primary care physician; [name], MD   Note: This dictation was prepared with Dragon dictation along with smaller phrase technology. Any transcriptional errors that result from this process are unintentional.

## 2018-07-22 NOTE — Discharge Instructions (Signed)
[facility] 

## 2018-07-22 NOTE — Progress Notes (Signed)
Physical Therapy Treatment Patient Details Name: Cody Day MRN: [id number] DOB: 06-Aug-1956 Today's Date: 07/22/2018    History of Present Illness Pt presented to ER s/p fall with complaints of dizziness, pt admitted for hyponatremia. PMH includes EOTH and tobacco abuse, COPD/asthma, gout and HTN.    PT Comments    Pt agreeable to PT on arrival. Requested multiple times that IV be removed. Pt performed ther-ex with decreased VC for technique. Continues to perform bed mobility independently and safely. Pt demonstrates improved ability to transfer, is steady on feet. He also demonstrates increased endurance and strength, he is able to amb much greater distance without rest breaks. Will continue to progress strength and mobility as able.   Follow Up Recommendations  Home health PT     Equipment Recommendations  Rolling walker with 5" wheels    Recommendations for Other Services       Precautions / Restrictions Precautions Precautions: Fall Restrictions Weight Bearing Restrictions: No    Mobility  Bed Mobility Overal bed mobility: Independent             General bed mobility comments: No difficulties mobilizing safely in bed  Transfers Overall transfer level: Modified independent Equipment used: Rolling walker (2 wheeled) Transfers: Sit to/from Stand Sit to Stand: Supervision         General transfer comment: Pt has good ant. translation, better activation of posterior chain today, no apparent LOB with mod weight shift.  Ambulation/Gait Ambulation/Gait assistance: Min guard Gait Distance (Feet): 220 Feet Assistive device: Rolling walker (2 wheeled) Gait Pattern/deviations: Step-through pattern Gait velocity: Amb 10 ft in 3.5 sec (2.86 ft/sec) Gait velocity interpretation: >2.62 ft/sec, indicative of community ambulatory General Gait Details: Pt amb with reciprocal pattern and good heel strike and foot clearance. No unsteadiness and does not  fatigue.   Stairs             Wheelchair Mobility    Modified Rankin (Stroke Patients Only)       Balance Overall balance assessment: Needs assistance Sitting-balance support: Feet supported;No upper extremity supported Sitting balance-Leahy Scale: Good     Standing balance support: Bilateral upper extremity supported Standing balance-Leahy Scale: Good Standing balance comment: Pt steady on feet, lightly holds RW for minimal UE support, no apparent LOB.                            Cognition Arousal/Alertness: Awake/alert Behavior During Therapy: WFL for tasks assessed/performed Overall Cognitive Status: Within Functional Limits for tasks assessed                                 General Comments: Pt follows commands consistantly      Exercises Other Exercises Other Exercises: Supine AROM BLE ankle pumps, quad sets, SLRs, hip ABD/ADD. All ther-ex performed x15 reps with min VC for technique. Other Exercises: Seated AROM LAQs and marching. All ther-ex performed x15 reps with min VC for technique.    General Comments        Pertinent Vitals/Pain Pain Assessment: No/denies pain    Home Living                      Prior Function            PT Goals (current goals can now be found in the care plan section) Acute Rehab PT Goals Patient Stated Goal: To  strengthen my legs so I can walk normally again and go home. PT Goal Formulation: With patient Time For Goal Achievement: 08/04/18 Potential to Achieve Goals: Good Progress towards PT goals: Progressing toward goals    Frequency    Min 2X/week      PT Plan Current plan remains appropriate    Co-evaluation              AM-PAC PT "6 Clicks" Daily Activity  Outcome Measure  Difficulty turning over in bed (including adjusting bedclothes, sheets and blankets)?: None Difficulty moving from lying on back to sitting on the side of the bed? : None Difficulty sitting  down on and standing up from a chair with arms (e.g., wheelchair, bedside commode, etc,.)?: A Little Help needed moving to and from a bed to chair (including a wheelchair)?: None Help needed walking in hospital room?: A Little Help needed climbing 3-5 steps with a railing? : A Little 6 Click Score: 21    End of Session Equipment Utilized During Treatment: Gait belt Activity Tolerance: Patient tolerated treatment well Patient left: in chair;with call bell/phone within reach;with chair alarm set Nurse Communication: Mobility status PT Visit Diagnosis: Unsteadiness on feet (R26.81);Muscle weakness (generalized) (M62.81);History of falling (Z91.81)     Time: 4680-3212 PT Time Calculation (min) (ACUTE ONLY): 26 min  Charges:                        Algis Downs, SPT   Algis Downs 07/22/2018, 2:31 PM

## 2018-07-22 NOTE — Progress Notes (Signed)
MD order received to discharge pt home with home health today in [facility]; Care Management previously established home health services with [facility]; verbally reviewed AVS with pt and pt's son, gave Rx to pt; no questions voiced at this time; pt's discharge pending arrival of auxillary for discharge

## 2018-07-22 NOTE — Care Management Note (Signed)
Case Management Note  Patient Details  Name: Cody Day MRN: [id number] Date of Birth: [birth year]  Subjective/Objective:   Admitted to [facility] with the diagnosis of hyponatremia, Lives with wife. Sees Dr. Diona Browner as primary care physician. Prescriptions are filled at [facility]. Home health in the past as outpatient. No skilled facility.  Retired on [employer].                 Action/Plan: Physical therapy evaluation completed. Recommending home with Home Health/Physical therapy. Discussed agencies. [place]. Will update representative.    Expected Discharge Date:  07/22/18               Expected Discharge Plan:     In-House Referral:   yes  Discharge planning Services   yes  Post Acute Care Choice:   yes Choice offered to:   patient  DME Arranged:    DME Agency:     HH Arranged:    HH Agency:     Status of Service:     If discussed at [employer], dates discussed:    Additional Comments:  Shelbie Ammons, RN MSN CCM Care Management [phone] 07/22/2018, 12:15 PM

## 2018-07-22 NOTE — Progress Notes (Signed)
[place] [phone] (son)  PCP: Angelita Ingles 07/22/2018, 12:31 PM

## 2018-08-14 ENCOUNTER — Inpatient Hospital Stay: Payer: BLUE CROSS/BLUE SHIELD | Admitting: Family Medicine

## 2018-08-17 ENCOUNTER — Other Ambulatory Visit: Payer: Self-pay | Admitting: Family Medicine

## 2018-08-18 ENCOUNTER — Encounter: Payer: Self-pay | Admitting: Family Medicine

## 2018-08-18 ENCOUNTER — Ambulatory Visit: Payer: BLUE CROSS/BLUE SHIELD | Admitting: Family Medicine

## 2018-08-18 VITALS — BP 120/88 | HR 91 | Temp 98.8°F | Ht 71.0 in | Wt 201.5 lb

## 2018-08-18 DIAGNOSIS — F1011 Alcohol abuse, in remission: Secondary | ICD-10-CM | POA: Insufficient documentation

## 2018-08-18 DIAGNOSIS — R7989 Other specified abnormal findings of blood chemistry: Secondary | ICD-10-CM

## 2018-08-18 DIAGNOSIS — E871 Hypo-osmolality and hyponatremia: Secondary | ICD-10-CM

## 2018-08-18 DIAGNOSIS — F321 Major depressive disorder, single episode, moderate: Secondary | ICD-10-CM | POA: Diagnosis not present

## 2018-08-18 DIAGNOSIS — J4551 Severe persistent asthma with (acute) exacerbation: Secondary | ICD-10-CM

## 2018-08-18 DIAGNOSIS — N62 Hypertrophy of breast: Secondary | ICD-10-CM

## 2018-08-18 NOTE — Patient Instructions (Addendum)
Please stop at the front desk to set up referral to urology, pulmonary Move celexa to night to help with side effects.  Please stop at the lab to have labs drawn.

## 2018-08-18 NOTE — Progress Notes (Signed)
   Subjective:    Patient ID: Cody Day, male    DOB: 1956/10/12, 62 y.o.   MRN: [id number]  HPI   62 year old male presents for hospital follow up following syncopal event ( no LOC), dizziness and falls.. Due to hyponatremia, alcohol abuse. Admitted 07/20/2018  Discharged 07/22/2018  1.  Hyponatremia-secondary to beer portal mania, poor oral intake -Received fluids.  Improving sodium level, now at 131.  2.  Acute alcohol intoxication-alcohol level was 243 on admission. -Counseled.  Also discussed the same with his wife.  Watched for any withdrawals.  Not in acute DTs. -Received Ativan as needed in the hospital  3.  Depression-generally low mood, according to wife that despite he is drinking more. -Started on Celexa 20 mg.  Will need outpatient follow-up.  If dose needs to be adjusted  4.  COPD-stable.  Continue home inhalers  5.  Hypertension-on lisinopril   Today he reports he feels different.. Feeling better .Marland Kitchen Doing more.  he had been drinking 4 beers to 12 a day.  He has been eating better and drinking more fluids. He has been doing pedialyte for electrolytes. He has not had any beer since hospital.  He has not had any issues with withdrawal.  No further syncope..   Wt Readings from Last 3 Encounters:  08/18/18 201 lb 8 oz (91.4 kg)  07/20/18 204 lb 1.6 oz (92.6 kg)  04/17/18 220 lb (99.8 kg)   Blood pressure 120/88, pulse 91, temperature 98.8 F (37.1 C), temperature source Oral, height 5\' 11"  (1.803 m), weight 201 lb 8 oz (91.4 kg), SpO2 97 %. Social History /Family History/Past Medical History reviewed in detail and updated in EMR if needed.   Review of Systems  Constitutional: Positive for fatigue. Negative for fever.  HENT: Negative for ear pain.   Eyes: Negative for pain.  Respiratory: Positive for shortness of breath. Negative for cough.   Cardiovascular: Negative for chest pain, palpitations and leg swelling.  Gastrointestinal: Negative for abdominal  pain.  Genitourinary: Negative for dysuria.  Musculoskeletal: Negative for arthralgias.  Neurological: Negative for syncope, light-headedness and headaches.  Psychiatric/Behavioral: Negative for dysphoric mood.       Objective:   Physical Exam  Constitutional: Vital signs are normal. He appears well-developed and well-nourished.  HENT:  Head: Normocephalic.  Right Ear: Hearing normal.  Left Ear: Hearing normal.  Nose: Nose normal.  Mouth/Throat: Oropharynx is clear and moist and mucous membranes are normal.  Neck: Trachea normal. Carotid bruit is not present. No thyroid mass and no thyromegaly present.  Cardiovascular: Normal rate, regular rhythm and normal pulses. Exam reveals no gallop, no distant heart sounds and no friction rub.  No murmur heard. No peripheral edema  Pulmonary/Chest: Effort normal. No respiratory distress. He has decreased breath sounds. He has wheezes. He has no rhonchi.  Diffuse gynecomastia bilaterally, no masses  Genitourinary:  Genitourinary Comments: No hypogonadism  Neurological: He has normal strength. No cranial nerve deficit or sensory deficit. He displays a negative Romberg sign. Coordination and gait normal.  severe tremor.  Skin: Skin is warm, dry and intact. No rash noted.  Psychiatric: He has a normal mood and affect. His speech is normal and behavior is normal. Thought content normal.          Assessment & Plan:

## 2018-08-19 LAB — BASIC METABOLIC PANEL
BUN: 9 mg/dL (ref 6–23)
CO2: 26 mEq/L (ref 19–32)
Calcium: 9.6 mg/dL (ref 8.4–10.5)
Chloride: 101 mEq/L (ref 96–112)
Creatinine, Ser: 0.71 mg/dL (ref 0.40–1.50)
GFR: 119.58 mL/min (ref >60.00)
Glucose, Bld: 99 mg/dL (ref 70–99)
Potassium: 4.6 mEq/L (ref 3.5–5.1)
Sodium: 136 mEq/L (ref 135–145)

## 2018-08-21 ENCOUNTER — Encounter: Payer: Self-pay | Admitting: *Deleted

## 2018-09-08 ENCOUNTER — Institutional Professional Consult (permissible substitution): Payer: BLUE CROSS/BLUE SHIELD | Admitting: Internal Medicine

## 2018-09-14 ENCOUNTER — Other Ambulatory Visit: Payer: Self-pay | Admitting: Family Medicine

## 2018-09-15 ENCOUNTER — Ambulatory Visit: Payer: BLUE CROSS/BLUE SHIELD | Admitting: Family Medicine

## 2018-09-22 ENCOUNTER — Other Ambulatory Visit: Payer: Self-pay | Admitting: Family Medicine

## 2018-09-23 ENCOUNTER — Ambulatory Visit (INDEPENDENT_AMBULATORY_CARE_PROVIDER_SITE_OTHER): Payer: BLUE CROSS/BLUE SHIELD | Admitting: Urology

## 2018-09-23 ENCOUNTER — Encounter: Payer: Self-pay | Admitting: Urology

## 2018-09-23 VITALS — BP 150/86 | HR 89 | Ht 71.0 in | Wt 217.9 lb

## 2018-09-23 DIAGNOSIS — E291 Testicular hypofunction: Secondary | ICD-10-CM | POA: Diagnosis not present

## 2018-09-23 MED ORDER — TESTOSTERONE CYPIONATE 100 MG/ML IM SOLN: 100.0000 mg | INTRAMUSCULAR | 0 refills | Status: DC

## 2018-09-23 NOTE — Assessment & Plan Note (Signed)
May be cause of gynecomastia... Re-eval.

## 2018-09-23 NOTE — Assessment & Plan Note (Signed)
No ETOH intake per pt and wife since hospitalization. Encouraged pt and offered AA/counsleing referral.

## 2018-09-23 NOTE — Progress Notes (Signed)
09/23/2018 2:32 PM   Cody Day 14-May-1956 [id number]  Referring provider: [name], MD [address] [place], [place] [address]  Chief Complaint  Patient presents with  . Hypogonadism    HPI: 62 year old male seen in consultation at the request of Dr. Diona Browner for evaluation of hypogonadism.  He was on TRT managed by Dr. Jacqlyn Larsen for many years.  He states his testosterone level was low normal and replacement was not covered by insurance but he was paying for injections and injecting 100 mg weekly.  He has been off replacement approximately 1.5 years and complains of significant tiredness and fatigue.  A testosterone level checked by his PCP in July 2019 was low normal 319 ng/dL.  LH and prolactin were normal.  He is requesting to restart testosterone replacement.  He has also been followed for BPH with lower urinary tract symptoms and a history of chronic prostatitis.  He also has a history of stone disease.  He presently has no bothersome lower urinary tract symptoms.  He has no symptoms of chronic prostatitis and denies flank/abdominal/pelvic or scrotal pain.   PMH: Past Medical History:  Diagnosis Date  . Achilles tendon rupture 10/11   and repair  . Adenomatous polyp of colon 04/06/2010   Adenomatous polyps 3 in the descending colon, times one in the ascending colon [facility])  . Asthma   . Gout   . Hemorrhoids   . Hypercholesteremia   . Hypertension   . Sleep apnea    doesn't wear CPAP  . Syncope 10/11   in settin gof asthma exacerbation w coughing. Ech (10/11): EF > 55%, mild LVH, grade I diastolic dysfunction, nomral RV size and systolic function,. normal valves. Carotid US (10/11): minimal disease    Surgical History: Past Surgical History:  Procedure Laterality Date  . ABDOMINAL HERNIA REPAIR  1998,2000  . calcium kidney stones    . [facility], [name], [place];  Dr. [name]. Multiple adenomatous polyps (total 4).   . COLONOSCOPY WITH PROPOFOL N/A 08/30/2015   Procedure: COLONOSCOPY WITH PROPOFOL;  Surgeon: [name], MD;  Location: [facility] ENDOSCOPY;  Service: Endoscopy;  Laterality: N/A;  . FEMORAL HERNIA REPAIR  2007  . HEMORRHOID SURGERY    . HERNIA REPAIR  09/10/2010   Repair of recurrent ventral hernia, resection of previously placed mesh x2, implantation 7.5 x 10 cm Gore-Tex dual mesh in an underlay position, repair of epigastric hernia with a 4.2 cm Proceed ventral patch.  Marland Kitchen HERNIA REPAIR   02//28/2007   Laparoscopic right inguinal hernia repair with Surgipro mesh, Ventrilex patch at umbilicus  . HERNIA REPAIR  11/01/1990  .   Small oval Kugel patch placed in preperitoneal space, [name], MD  . HERNIA REPAIR  01/17/1997   Recurrent ventral hernia 15 x 19 Gore-Tex dual mesh placed laparoscopically with multiple lefft--sided ports, [name], MD  . HERNIA REPAIR  01/07/1995    Primary repair of ventral hernia. [name], MD  . intestinal blockage     as a child  . KIDNEY STONE extraction     unic acid stones  . left heart cath  2008   minimal luminal irregularities EF 55%  . RIGHT/LEFT HEART CATH AND CORONARY ANGIOGRAPHY N/A 01/13/2017   Procedure: Right/Left Heart Cath and Coronary Angiography;  Surgeon: [name], MD;  Location: [facility] CV LAB;  Service: Cardiovascular;  Laterality: N/A;  . treadmill stress test  2003    Home Medications:  Allergies as of 09/23/2018      Reactions   Levaquin [levofloxacin In D5w]    Severe headaches, skin tingling/redness   Penicillins Hives, Rash   Has patient had a PCN reaction causing immediate rash, facial/tongue/throat swelling, SOB or lightheadedness with hypotension: Yes Has patient had a PCN reaction causing severe rash involving mucus membranes or skin necrosis: Unknown Has patient had a PCN reaction that required hospitalization: No Has patient had a PCN reaction occurring  within the last 10 years: No - Childhood reaction If all of the above answers are "NO", then may proceed with Cephalosporin use.      Medication List        Accurate as of 09/23/18  2:32 PM. Always use your most recent med list.          albuterol (2.5 MG/3ML) 0.083% nebulizer solution Commonly known as:  PROVENTIL Take 3 mLs (2.5 mg total) by nebulization every 4 (four) hours as needed for wheezing or shortness of breath.   PROAIR HFA 108 (90 Base) MCG/ACT inhaler Generic drug:  albuterol Use 2 inhalations every four to six hours as needed   allopurinol 300 MG tablet Commonly known as:  ZYLOPRIM Take 1 tablet (300 mg total) by mouth daily.   citalopram 20 MG tablet Commonly known as:  CELEXA Take 1 tablet (20 mg total) by mouth daily.   fluticasone 220 MCG/ACT inhaler Commonly known as:  FLOVENT HFA Inhale 2 puffs into the lungs 2 (two) times daily.   lisinopril 20 MG tablet Commonly known as:  PRINIVIL,ZESTRIL TAKE ONE (1) TABLET EACH DAY   montelukast 10 MG tablet Commonly known as:  SINGULAIR Take 1 tablet (10 mg total) by mouth daily.   multivitamin with minerals Tabs tablet Take 1 tablet by mouth daily.   simvastatin 20 MG tablet Commonly known as:  ZOCOR TAKE ONE TABLET BY MOUTH EVERY DAY   STIOLTO RESPIMAT 2.5-2.5 MCG/ACT Aers Generic drug:  Tiotropium Bromide-Olodaterol Use two inhalations every day   Vitamin D3 1.25 MG (50000 UT) Caps Take 1 capsule by mouth once a week.       Allergies:  Allergies  Allergen Reactions  . Levaquin [Levofloxacin In D5w]     Severe headaches, skin tingling/redness  . Penicillins Hives and Rash    Has patient had a PCN reaction causing immediate rash, facial/tongue/throat swelling, SOB or lightheadedness with hypotension: Yes Has patient had a PCN reaction causing severe rash involving mucus membranes or skin necrosis: Unknown Has patient had a PCN reaction that required hospitalization: No Has patient had a PCN  reaction occurring within the last 10 years: No - Childhood reaction If all of the above answers are "NO", then may proceed with Cephalosporin use.     Family History: Family History  Problem Relation Age of Onset  . Hypertension Father   . Cataracts Father   . Diabetes Father   . Heart attack Mother 43  . Coronary artery disease Mother   . Dementia Mother   . Hypertension Sister   . Depression Sister   . Hypertension Brother   . Colon cancer Brother   . Diabetes Other        1st degree relative  . Colon cancer Maternal Aunt     Social History:  reports that he quit smoking about 22 years ago. His smoking use included cigarettes. He has a 8.75 pack-year smoking history. His  smokeless tobacco use includes chew. He reports that he drinks alcohol. He reports that he does not use drugs.  ROS: UROLOGY Frequent Urination?: No Hard to postpone urination?: No Burning/pain with urination?: No Get up at night to urinate?: Yes Leakage of urine?: No Urine stream starts and stops?: Yes Trouble starting stream?: No Do you have to strain to urinate?: No Blood in urine?: No Urinary tract infection?: No Sexually transmitted disease?: No Injury to kidneys or bladder?: No Painful intercourse?: No Weak stream?: No Erection problems?: No Penile pain?: No  Gastrointestinal Nausea?: No Vomiting?: No Indigestion/heartburn?: No Diarrhea?: No Constipation?: No  Constitutional Fever: No Night sweats?: No Weight loss?: No Fatigue?: Yes  Skin Skin rash/lesions?: No Itching?: No  Eyes Blurred vision?: No Double vision?: No  Ears/Nose/Throat Sore throat?: No Sinus problems?: Yes  Hematologic/Lymphatic Swollen glands?: No Easy bruising?: Yes  Cardiovascular Leg swelling?: No Chest pain?: No  Respiratory Cough?: Yes Shortness of breath?: Yes  Endocrine Excessive thirst?: Yes  Musculoskeletal Back pain?: Yes Joint pain?: Yes  Neurological Headaches?:  Yes Dizziness?: Yes  Psychologic Depression?: Yes Anxiety?: Yes  Physical Exam: BP (!) 150/86 (BP Location: Left Arm, Patient Position: Sitting, Cuff Size: Large)   Pulse 89   Ht 5\' 11"  (1.803 m)   Wt 217 lb 14.4 oz (98.8 kg)   BMI 30.39 kg/m   Constitutional:  Alert and oriented, No acute distress. HEENT: [place] AT, moist mucus membranes.  Trachea midline, no masses. Cardiovascular: No clubbing, cyanosis, or edema. Respiratory: Normal respiratory effort, no increased work of breathing. GI: Abdomen is soft, nontender, nondistended, no abdominal masses GU: No CVA tenderness.  Prostate 40 g, smooth without nodules Lymph: No cervical or inguinal lymphadenopathy. Skin: No rashes, bruises or suspicious lesions. Neurologic: Grossly intact, no focal deficits, moving all 4 extremities. Psychiatric: Normal mood and affect.   Assessment & Plan:   63 year old male with symptoms of hypogonadism and a low normal testosterone.  He complains of significant tiredness and fatigue.  He request to restart TRT.  Potential side effects of testosterone replacement were discussed including stimulation of benign prostatic growth with lower urinary tract symptoms; erythrocytosis; edema; gynecomastia; worsening sleep apnea; venous thromboembolism; testicular atrophy and infertility. Recent studies suggesting an increased incidence of heart attack and stroke in patients taking testosterone was discussed. He was informed there is conflicting evidence regarding the impact of testosterone therapy on cardiovascular risk. The theoretical risk of growth stimulation of an undetected prostate cancer was also discussed.  He was informed that current evidence does not provide any definitive answers regarding the risks of testosterone therapy on prostate cancer and cardiovascular disease. The need for periodic monitoring of his testosterone level, PSA, hematocrit and DRE was discussed.  Rx testosterone cypionate was sent to  his pharmacy at 100 mg weekly.  Follow-up testosterone level in approximately 6 weeks.  If level looks okay he will follow-up in 6 months for monitoring lab work including PSA, hematocrit, testosterone and DRE.   Abbie Sons, [place] [address], [place], [place] [address] [phone]

## 2018-09-23 NOTE — Assessment & Plan Note (Signed)
Refer back to pulmonary given continue poor control.

## 2018-09-23 NOTE — Assessment & Plan Note (Signed)
SE to celexa.. Try trial at bedtime.

## 2018-09-23 NOTE — Assessment & Plan Note (Signed)
Improved at discharge.Marland Kitchen Re-eval.

## 2018-09-24 ENCOUNTER — Encounter: Payer: Self-pay | Admitting: Urology

## 2018-09-28 ENCOUNTER — Ambulatory Visit: Payer: BLUE CROSS/BLUE SHIELD | Admitting: Internal Medicine

## 2018-09-28 ENCOUNTER — Encounter: Payer: Self-pay | Admitting: Internal Medicine

## 2018-09-28 ENCOUNTER — Other Ambulatory Visit: Payer: BLUE CROSS/BLUE SHIELD

## 2018-09-28 VITALS — BP 118/72 | HR 95 | Ht 71.0 in | Wt 221.6 lb

## 2018-09-28 DIAGNOSIS — J449 Chronic obstructive pulmonary disease, unspecified: Secondary | ICD-10-CM | POA: Diagnosis not present

## 2018-09-28 DIAGNOSIS — J4520 Mild intermittent asthma, uncomplicated: Secondary | ICD-10-CM

## 2018-09-28 MED ORDER — ALBUTEROL SULFATE HFA 108 (90 BASE) MCG/ACT IN AERS: 2.0000 | INHALATION_SPRAY | RESPIRATORY_TRACT | 10 refills | Status: DC | PRN

## 2018-09-28 NOTE — Progress Notes (Signed)
Name: Cody Day MRN: [id number] DOB: [birth year]     CONSULTATION DATE: 12.9.19 REFERRING MD : Cody Day  CHIEF COMPLAINT: asthma  STUDIES:     9.30.10 CXR independently reviewed by Cody Day today Low lung volumes No pneumonia  Synopsis: Cody Day first saw the [facility] pulmonary clinic in the spring of 2015 for shortness of breath, cough, mucus production. He had lung function testing in 2014 which was completely normal. He had a past history significant for long-term inhaled illicit drug use with multiple substances including methamphetamine.  Later PFT findings have shown airflow obstruction.    HISTORY OF PRESENT ILLNESS: 62 year old white male previously seen by Dr. Lake Day for ongoing shortness of breath from asthma and COPD Patient has been on [place] and [place] for many years He states that this regimen really helps his breathing Patient walks at a slow pace he usually Patient has chronic persistent shortness of breath and increased work of breathing, this has been going on for many years  No signs of exacerbation at this time No signs of acute heart failure at this time  Patient states that he had a cardiac cath approximately 1 year ago No significant CAD as per patient   Patient denies wheezing Denies cough No signs of infection at this time  Patient was referred to [facility] pulmonary for second opinion patient was advised to continue his inhalers as prescribed  Patient has smoked multiple substances in the past including crack marijuana smoking cigarettes Patient also has a history of alcohol abuse  Patient's main symptoms also include dizziness when standing and turning his head He also has lower extremity swelling with varicose veins  At this time I would recommend several test to be performed 6-minute walk test PFTs and ONO testing   PAST MEDICAL HISTORY :   has a past medical history of Achilles tendon rupture (10/11), Adenomatous polyp of  colon (04/06/2010), Asthma, Gout, Hemorrhoids, Hypercholesteremia, Hypertension, Sleep apnea, and Syncope (10/11).  has a past surgical history that includes treadmill stress test (2003); intestinal blockage; Abdominal hernia repair (8588,5027); Femoral hernia repair (2007); left heart cath (2008); KIDNEY STONE extraction; calcium kidney stones; Colonoscopy (2011); Hemorrhoid surgery; Hernia repair (09/10/2010); Hernia repair ( 02//28/2007); Hernia repair (11/01/1990  .); Hernia repair (01/17/1997); Hernia repair (01/07/1995 ); Cardiac catheterization; Colonoscopy with propofol (N/A, 08/30/2015); and RIGHT/LEFT HEART CATH AND CORONARY ANGIOGRAPHY (N/A, 01/13/2017). Prior to Admission medications   Medication Sig Start Date End Date Taking? Authorizing Provider  albuterol (PROVENTIL) (2.5 MG/3ML) 0.083% nebulizer solution Take 3 mLs (2.5 mg total) by nebulization every 4 (four) hours as needed for wheezing or shortness of breath. 04/17/18   Bedsole, Amy E, MD  allopurinol (ZYLOPRIM) 300 MG tablet Take 1 tablet (300 mg total) by mouth daily. 06/09/18   Bedsole, Amy E, MD  Cholecalciferol (VITAMIN D3) 50000 units CAPS Take 1 capsule by mouth once a week. 06/09/18   Bedsole, Amy E, MD  citalopram (CELEXA) 20 MG tablet Take 1 tablet (20 mg total) by mouth daily. 07/22/18 10/20/18  Gladstone Lighter, MD  fluticasone (FLOVENT HFA) 220 MCG/ACT inhaler Inhale 2 puffs into the lungs 2 (two) times daily.    [provider]  lisinopril (PRINIVIL,ZESTRIL) 20 MG tablet TAKE ONE (1) TABLET EACH DAY 06/12/18   Bedsole, Amy E, MD  montelukast (SINGULAIR) 10 MG tablet Take 1 tablet (10 mg total) by mouth daily. 06/09/18 06/09/19  [name], MD  Multiple Vitamin (MULTIVITAMIN WITH MINERALS) TABS tablet Take 1 tablet  by mouth daily. 07/22/18   Gladstone Lighter, MD  PROAIR HFA 108 [phone] Base) MCG/ACT inhaler Use 2 inhalations every four to six hours as needed 09/14/18   Bedsole, Amy E, MD  simvastatin (ZOCOR) 20 MG  tablet TAKE ONE TABLET BY MOUTH EVERY DAY 09/23/18   Bedsole, Amy E, MD  STIOLTO RESPIMAT 2.5-2.5 MCG/ACT AERS Use two inhalations every day 09/14/18   [name], MD  testosterone cypionate (DEPOTESTOTERONE CYPIONATE) 100 MG/ML injection Inject 1 mL (100 mg total) into the muscle once a week. 09/23/18   Abbie Sons, MD   Allergies  Allergen Reactions  . Levaquin [Levofloxacin In D5w]     Severe headaches, skin tingling/redness  . Penicillins Hives and Rash    Has patient had a PCN reaction causing immediate rash, facial/tongue/throat swelling, SOB or lightheadedness with hypotension: Yes Has patient had a PCN reaction causing severe rash involving mucus membranes or skin necrosis: Unknown Has patient had a PCN reaction that required hospitalization: No Has patient had a PCN reaction occurring within the last 10 years: No - Childhood reaction If all of the above answers are "NO", then may proceed with Cephalosporin use.     FAMILY HISTORY:  family history includes Cataracts in his father; Colon cancer in his brother and maternal aunt; Coronary artery disease in his mother; Dementia in his mother; Depression in his sister; Diabetes in his father and other; Heart attack (age of onset: 71) in his mother; Hypertension in his brother, father, and sister. SOCIAL HISTORY:  reports that he quit smoking about 22 years ago. His smoking use included cigarettes. He has a 8.75 pack-year smoking history. His smokeless tobacco use includes chew. He reports that he drinks alcohol. He reports that he does not use drugs.  REVIEW OF SYSTEMS:   Constitutional: Negative for fever, chills, weight loss, malaise/fatigue and diaphoresis.  HENT: Negative for hearing loss, ear pain, nosebleeds, congestion, sore throat, neck pain, tinnitus and ear discharge.   Eyes: Negative for blurred vision, double vision, photophobia, pain, discharge and redness.  Respiratory: Negative for cough, hemoptysis, sputum  production, shortness of breath, wheezing and stridor.   Cardiovascular: Negative for chest pain, palpitations, orthopnea, claudication, leg swelling and PND.  Gastrointestinal: Negative for heartburn, nausea, vomiting, abdominal pain, diarrhea, constipation, blood in stool and melena.  Genitourinary: Negative for dysuria, urgency, frequency, hematuria and flank pain.  Musculoskeletal: Negative for myalgias, back pain, joint pain and falls.  Skin: Negative for itching and rash.  Neurological: Negative for dizziness, tingling, tremors, sensory change, speech change, focal weakness, seizures, loss of consciousness, weakness and headaches.  Endo/Heme/Allergies: Negative for environmental allergies and polydipsia. Does not bruise/bleed easily.  ALL OTHER ROS ARE NEGATIVE   BP 118/72 (BP Location: Left Arm, Cuff Size: Normal)   Pulse 95   Ht 5\' 11"  (1.803 m)   Wt 221 lb 9.6 oz (100.5 kg)   SpO2 97%   BMI 30.91 kg/m    Physical Examination:   GENERAL:NAD, no fevers, chills, no weakness no fatigue HEAD: Normocephalic, atraumatic.  EYES: Pupils equal, round, reactive to light. Extraocular muscles intact. No scleral icterus.  MOUTH: Moist mucosal membrane.   EAR, NOSE, THROAT: Clear without exudates. No external lesions.  NECK: Supple. No thyromegaly. No nodules. No JVD.  PULMONARY:CTA B/L no wheezes, no crackles, no rhonchi CARDIOVASCULAR: S1 and S2. Regular rate and rhythm. No murmurs, rubs, or gallops. No edema.  GASTROINTESTINAL: Soft, nontender, nondistended. No masses. Positive bowel sounds.  MUSCULOSKELETAL: No swelling, clubbing,  or edema. Range of motion full in all extremities.  NEUROLOGIC: Cranial nerves II through XII are intact. No gross focal neurological deficits.  SKIN: No ulceration, lesions, rashes, or cyanosis. Skin warm and dry. Turgor intact.  PSYCHIATRIC: Mood, affect within normal limits. The patient is awake, alert and oriented x 3. Insight, judgment intact.       ASSESSMENT / PLAN: 62 year old white male seen today for chronic persistent shortness of breath most likely related to previous smoking history of polysubstance abuse is in the setting of asthma and COPD in conjunction with deconditioned state With evidence of vertigo and peripheral artery disease  Shortness of breath and dyspnea on exertion related to asthma COPD deconditioned state I will need to assess for hypoxia We will obtain 6-minute walk test Also obtain overnight pulse oximetry    Asthma and COPD Continue inhalers as prescribed  Flovent HFA twice a day Stiolto as prescribed Albuterol as needed No signs of No indication for steroids at This time no need for antibiotics    Deconditioned state I have advised patient that he may qualify for pulmonary rehab  will obtain pulmonary function testing   Signs and symptoms of peripheral artery Disease I recommend vascular surgery consultation   Patient with underlying sleep apnea however is noncompliant with CPAP at this time     Patient/ satisfied with Plan of action and management. All questions answered Follow up in 6 months  Tracyann Duffell Patricia Pesa, M.D.  [facility]  Medical Director [place] Director [facility] Cardio-Pulmonary Department

## 2018-09-28 NOTE — Patient Instructions (Signed)
Check PFT's  Check 6MWT  Check ONO  Continue inhalers as prescribed Ventolin INH as needed

## 2018-10-02 ENCOUNTER — Encounter: Payer: BLUE CROSS/BLUE SHIELD | Admitting: Family Medicine

## 2018-10-02 ENCOUNTER — Ambulatory Visit: Payer: BLUE CROSS/BLUE SHIELD

## 2018-10-02 DIAGNOSIS — J4520 Mild intermittent asthma, uncomplicated: Secondary | ICD-10-CM

## 2018-10-02 NOTE — Progress Notes (Signed)
SIX MIN WALK 10/02/2018 06/21/2016 01/25/2014  Medications Allopurinol, lisinopril, singulair, multi-votamin, Zocor Symbicort, Lisinopril, Primidone, Simivastin, Spiriva -  Supplimental Oxygen during Test? (L/min) No No No  Laps 10 6 -  Partial Lap (in Meters) 9 3 -  Baseline BP (sitting) 160/96 142/86 -  Baseline Heartrate 87 97 -  Baseline Dyspnea (Borg Scale) 3 7 -  Baseline Fatigue (Borg Scale) 2 7 -  Baseline SPO2 96 98 -  BP (sitting) 170/100 142/100 -  Heartrate 96 107 -  Dyspnea (Borg Scale) 4 10 -  Fatigue (Borg Scale) 0 10 -  SPO2 97 99 -  BP (sitting) 170/92 144/98 -  Heartrate 80 99 -  SPO2 98 98 -  Stopped or Paused before Six Minutes No No -  Distance Completed [phone] -  Tech Comments: Patient walked at moderately slow pace without difficulty. Patient did not desat during walk.  Pt c/o chest tightness with the walk. -

## 2018-10-07 ENCOUNTER — Telehealth: Payer: Self-pay | Admitting: *Deleted

## 2018-10-07 DIAGNOSIS — J449 Chronic obstructive pulmonary disease, unspecified: Secondary | ICD-10-CM

## 2018-10-07 NOTE — Telephone Encounter (Signed)
LMTCB  Results ONO: Needs 1 liter 02 qhs.

## 2018-10-07 NOTE — Telephone Encounter (Signed)
Pt has been made aware of results of ONO Orders placed  Nothing further needed.

## 2018-10-08 ENCOUNTER — Ambulatory Visit: Payer: BLUE CROSS/BLUE SHIELD | Attending: Internal Medicine

## 2018-10-08 DIAGNOSIS — J449 Chronic obstructive pulmonary disease, unspecified: Secondary | ICD-10-CM | POA: Diagnosis present

## 2018-10-08 MED ORDER — ALBUTEROL SULFATE (2.5 MG/3ML) 0.083% IN NEBU
2.5000 mg | INHALATION_SOLUTION | Freq: Once | RESPIRATORY_TRACT | Status: AC
  Administered 2018-10-08: 2.5 mg via RESPIRATORY_TRACT
  Filled 2018-10-08: qty 3

## 2018-10-09 ENCOUNTER — Telehealth: Payer: Self-pay | Admitting: Family Medicine

## 2018-10-09 ENCOUNTER — Other Ambulatory Visit: Payer: Self-pay

## 2018-10-09 MED ORDER — CITALOPRAM HYDROBROMIDE 20 MG PO TABS: 20.0000 mg | ORAL_TABLET | Freq: Every day | ORAL | 5 refills | Status: DC

## 2018-10-09 NOTE — Telephone Encounter (Signed)
Pt's new pharmacy is going to be [facility].

## 2018-10-09 NOTE — Telephone Encounter (Signed)
[facility] requesting refill citalopram. Last refilled # 30 x 2 on 07/22/18 by Dr Ilean China.pt last seen 08/18/18 for HFU. Pt has CPX scheduled on 02/18/19.Please advise.

## 2018-10-09 NOTE — Telephone Encounter (Signed)
Noted  

## 2018-10-15 ENCOUNTER — Encounter: Payer: BLUE CROSS/BLUE SHIELD | Admitting: Family Medicine

## 2018-10-20 ENCOUNTER — Other Ambulatory Visit: Payer: BLUE CROSS/BLUE SHIELD

## 2018-10-23 ENCOUNTER — Ambulatory Visit: Payer: BLUE CROSS/BLUE SHIELD

## 2018-10-27 ENCOUNTER — Encounter: Payer: BLUE CROSS/BLUE SHIELD | Admitting: Family Medicine

## 2018-10-29 ENCOUNTER — Other Ambulatory Visit: Payer: Self-pay

## 2018-10-29 ENCOUNTER — Other Ambulatory Visit: Payer: Self-pay | Admitting: Family Medicine

## 2018-10-29 ENCOUNTER — Telehealth: Payer: Self-pay | Admitting: Urology

## 2018-10-29 ENCOUNTER — Ambulatory Visit: Payer: BLUE CROSS/BLUE SHIELD

## 2018-10-29 MED ORDER — LISINOPRIL 20 MG PO TABS: ORAL_TABLET | ORAL | 1 refills | Status: DC

## 2018-10-29 MED ORDER — ALLOPURINOL 300 MG PO TABS: 300.0000 mg | ORAL_TABLET | Freq: Every day | ORAL | 1 refills | Status: DC

## 2018-10-29 MED ORDER — MONTELUKAST SODIUM 10 MG PO TABS: 10.0000 mg | ORAL_TABLET | Freq: Every day | ORAL | 1 refills | Status: DC

## 2018-10-29 MED ORDER — TESTOSTERONE CYPIONATE 100 MG/ML IM SOLN: 100.0000 mg | INTRAMUSCULAR | 0 refills | Status: DC

## 2018-10-29 MED ORDER — VITAMIN D3 1.25 MG (50000 UT) PO CAPS: 1.0000 | ORAL_CAPSULE | ORAL | 1 refills | Status: DC

## 2018-10-29 NOTE — Telephone Encounter (Signed)
Last office visit 08/18/2018 for hospital follow up.  Last refilled 06/09/2018 for #12 with 1 refill.  Last Vit D level I see is from 03/26/2013 which was normal.  Ok to refill?  CPE scheduled for 02/18/2019.

## 2018-10-29 NOTE — Telephone Encounter (Signed)
[facility] has closed.  Dian Situ from [facility] [phone]) called and is requesting a new Rx for testosterone.

## 2018-10-29 NOTE — Telephone Encounter (Signed)
Need new prescription for meds below Allopurinol 300mg  Lisinopril 20mg  singulair 10 mg Vitamin D 5000 units   Sent to [facility]

## 2018-10-29 NOTE — Telephone Encounter (Signed)
Testosterone Rx printed for signature - needs to be sent to [facility]

## 2018-11-03 ENCOUNTER — Other Ambulatory Visit: Payer: Self-pay

## 2018-11-03 DIAGNOSIS — E291 Testicular hypofunction: Secondary | ICD-10-CM

## 2018-11-04 ENCOUNTER — Other Ambulatory Visit: Payer: Medicare Other

## 2018-11-04 DIAGNOSIS — Z125 Encounter for screening for malignant neoplasm of prostate: Secondary | ICD-10-CM | POA: Diagnosis not present

## 2018-11-04 DIAGNOSIS — E291 Testicular hypofunction: Secondary | ICD-10-CM

## 2018-11-05 ENCOUNTER — Telehealth: Payer: Self-pay | Admitting: Family Medicine

## 2018-11-05 LAB — HEMATOCRIT: Hematocrit: 42.5 % (ref 37.5–51.0)

## 2018-11-05 LAB — TESTOSTERONE: Testosterone: 548 ng/dL (ref 264–916)

## 2018-11-05 LAB — PSA: Prostate Specific Ag, Serum: 0.4 ng/mL (ref 0.0–4.0)

## 2018-11-05 NOTE — Telephone Encounter (Signed)
LMOM for patient to return call.

## 2018-11-05 NOTE — Telephone Encounter (Signed)
-----   Message from Abbie Sons, MD sent at 11/05/2018  2:46 PM EST ----- Hematocrit was normal.  Testosterone level 548.  PSA low at 0.4.  Follow-up as scheduled.

## 2018-11-09 NOTE — Telephone Encounter (Signed)
Informed patient of results and recommendations. 

## 2018-11-10 ENCOUNTER — Ambulatory Visit (INDEPENDENT_AMBULATORY_CARE_PROVIDER_SITE_OTHER): Payer: Medicare Other | Admitting: Family Medicine

## 2018-11-10 ENCOUNTER — Encounter: Payer: Self-pay | Admitting: Family Medicine

## 2018-11-10 VITALS — BP 158/80 | HR 80 | Temp 98.6°F | Ht 71.0 in | Wt 228.0 lb

## 2018-11-10 DIAGNOSIS — J441 Chronic obstructive pulmonary disease with (acute) exacerbation: Secondary | ICD-10-CM

## 2018-11-10 MED ORDER — AZITHROMYCIN 250 MG PO TABS: ORAL_TABLET | ORAL | 0 refills | Status: DC

## 2018-11-10 MED ORDER — PREDNISONE 20 MG PO TABS: ORAL_TABLET | ORAL | 0 refills | Status: DC

## 2018-11-10 NOTE — Assessment & Plan Note (Signed)
Sputum change.. treat with antibiotics. Not clearly flu, past treatment time for tamiflu.  Treat with prednisone, oxygen prn, albuterol prn.

## 2018-11-10 NOTE — Progress Notes (Signed)
Subjective:    Patient ID: Cody Day, male    DOB: [birth year], 63 y.o.   MRN: [id number]  Cough  This is a new problem. The current episode started in the past 7 days. The problem has been gradually worsening. The cough is productive of sputum. Associated symptoms include a fever, headaches, nasal congestion, shortness of breath and wheezing. Pertinent negatives include no chills, ear congestion, ear pain, myalgias or sore throat. Associated symptoms comments: sibjective. The symptoms are aggravated by lying down. Risk factors for lung disease include smoking/tobacco exposure. His past medical history is significant for asthma and COPD.   Pulm Dr. Mortimer Fries. Reviewed note 09/2018   Has  oxygen available at night and prn.  On Stiolto and flovent. Using albuterol 3 times a day since he has bene feeling worse.   Feeling better on testosterone and  Has not had ETOH since 07/2018 Social History /Family History/Past Medical History reviewed in detail and updated in EMR if needed. Blood pressure (!) 158/80, pulse 80, temperature 98.6 F (37 C), temperature source Oral, height 5\' 11"  (1.803 m), weight 228 lb (103.4 kg), SpO2 97 %.  Review of Systems  Constitutional: Positive for fever. Negative for chills.  HENT: Negative for ear pain and sore throat.   Respiratory: Positive for cough, shortness of breath and wheezing.   Musculoskeletal: Negative for myalgias.  Neurological: Positive for headaches.       Objective:   Physical Exam Constitutional:      General: He is not in acute distress.    Appearance: Normal appearance. He is well-developed. He is not ill-appearing or toxic-appearing.  HENT:     Head: Normocephalic and atraumatic.     Right Ear: Hearing, tympanic membrane, ear canal and external ear normal. No tenderness. No foreign body. Tympanic membrane is not retracted or bulging.     Left Ear: Hearing, tympanic membrane, ear canal and external ear normal. No tenderness. No foreign  body. Tympanic membrane is not retracted or bulging.     Nose: Nose normal. No mucosal edema or rhinorrhea.     Right Sinus: No maxillary sinus tenderness or frontal sinus tenderness.     Left Sinus: No maxillary sinus tenderness or frontal sinus tenderness.     Mouth/Throat:     Dentition: Normal dentition. No dental caries.     Pharynx: Uvula midline. No oropharyngeal exudate.     Tonsils: No tonsillar abscesses.  Eyes:     General: Lids are normal. Lids are everted, no foreign bodies appreciated.     Conjunctiva/sclera: Conjunctivae normal.     Pupils: Pupils are equal, round, and reactive to light.  Neck:     Musculoskeletal: Normal range of motion and neck supple.     Thyroid: No thyroid mass or thyromegaly.     Vascular: No carotid bruit.     Trachea: Trachea and phonation normal.  Cardiovascular:     Rate and Rhythm: Normal rate and regular rhythm.     Pulses: Normal pulses.     Heart sounds: Normal heart sounds, S1 normal and S2 normal. No murmur. No gallop.   Pulmonary:     Effort: Pulmonary effort is normal. No respiratory distress.     Breath sounds: Wheezing present. No rhonchi or rales.     Comments: Diffuse wheeze with deep breaths Abdominal:     General: Bowel sounds are normal.     Palpations: Abdomen is soft.     Tenderness: There is no abdominal tenderness. There  is no guarding or rebound.     Hernia: No hernia is present.  Skin:    General: Skin is warm and dry.     Findings: No rash.  Neurological:     Mental Status: He is alert.     Deep Tendon Reflexes: Reflexes are normal and symmetric.  Psychiatric:        Speech: Speech normal.        Behavior: Behavior normal.        Judgment: Judgment normal.           Assessment & Plan:

## 2018-11-10 NOTE — Patient Instructions (Addendum)
Complete a course of prednisone.  Complete a course of antibiotoics.  Use albuterol as needed every 4-6 hours.  Wear oxygen.  Call if not improving in next 48 hours.

## 2018-11-23 ENCOUNTER — Other Ambulatory Visit: Payer: Self-pay | Admitting: *Deleted

## 2018-11-23 MED ORDER — FLUTICASONE PROPIONATE HFA 220 MCG/ACT IN AERO: 2.0000 | INHALATION_SPRAY | Freq: Two times a day (BID) | RESPIRATORY_TRACT | 5 refills | Status: DC

## 2018-12-07 ENCOUNTER — Telehealth: Payer: Self-pay | Admitting: *Deleted

## 2018-12-07 NOTE — Telephone Encounter (Signed)
Patient called stating that he has the same thing that he had back on 11/10/18. Patient stated that he was given a Z-pak and Prednisone and he got better. Patient stated that he started last Wednesday with same symptoms chest congestion , productive cough/yellow-green and headache. Offered patient an appointment and he declined stating that he would like for message to go to Dr. Diona Browner first to see if she will send something in for him since he was recently send for the same time. Patient stated that he has a hard time coming in for an appointment. Advised patient that the doctors will usually not send in antibiotics without them being seen. [place]

## 2018-12-08 ENCOUNTER — Encounter: Payer: Self-pay | Admitting: Family Medicine

## 2018-12-08 ENCOUNTER — Ambulatory Visit (INDEPENDENT_AMBULATORY_CARE_PROVIDER_SITE_OTHER): Payer: Medicare Other | Admitting: Family Medicine

## 2018-12-08 VITALS — BP 158/84 | HR 100 | Temp 98.1°F | Resp 22 | Ht 71.0 in | Wt 237.2 lb

## 2018-12-08 DIAGNOSIS — J441 Chronic obstructive pulmonary disease with (acute) exacerbation: Secondary | ICD-10-CM | POA: Diagnosis not present

## 2018-12-08 MED ORDER — PREDNISONE 20 MG PO TABS: 40.0000 mg | ORAL_TABLET | Freq: Every day | ORAL | 0 refills | Status: AC

## 2018-12-08 MED ORDER — AZITHROMYCIN 250 MG PO TABS: ORAL_TABLET | ORAL | 0 refills | Status: DC

## 2018-12-08 NOTE — Telephone Encounter (Signed)
LAst issue 1 month ago... needs appt for eval to rule out alternate diagnosis for safety.

## 2018-12-08 NOTE — Telephone Encounter (Signed)
Patient called back and scheduled appointment today with Dr. Einar Pheasant at 4:00.

## 2018-12-08 NOTE — Progress Notes (Signed)
Subjective:     Cody Day is a 63 y.o. male presenting for URI (symptoms start 3 to 4 days ago. Nasal congestion, chest congestion, sore throat, severe headache, body aches, coughing up yellow-greenish phelgm coming up, felt hot and thought he probably had a fever. Has taking Acetaminophen.)     URI   This is a new problem. The current episode started in the past 7 days. The problem has been gradually worsening. The maximum temperature recorded prior to his arrival was 100.4 - 100.9 F. Associated symptoms include chest pain, congestion, coughing, diarrhea, headaches, rhinorrhea, sneezing and a sore throat. Pertinent negatives include no ear pain, nausea, plugged ear sensation, sinus pain or vomiting. He has tried acetaminophen for the symptoms. The treatment provided mild relief.   Using inhalers everyday - using albuterol daily - which is his regular routine - has used albuterol 6 times in the last 24 hours  Increased sputum production  Review of Systems  Constitutional: Positive for fever.  HENT: Positive for congestion, rhinorrhea, sneezing and sore throat. Negative for ear pain and sinus pain.   Respiratory: Positive for cough and shortness of breath.   Cardiovascular: Positive for chest pain.  Gastrointestinal: Positive for diarrhea. Negative for nausea and vomiting.  Musculoskeletal: Positive for myalgias.  Neurological: Positive for headaches.    12/08/2018: Telephone call: started wednesday, cough and congestion 11/10/2018: Clinic - COPD exacerbation > azithromycin and prednisone  Social History   Tobacco Use  Smoking Status Former Smoker  . Packs/day: 0.25  . Years: 35.00  . Pack years: 8.75  . Types: Cigarettes  . Last attempt to quit: 10/22/1995  . Years since quitting: 23.1  Smokeless Tobacco Current User  . Types: Chew  Tobacco Comment   rarely smoked cigarettes and cigars.  states he smoked drugs mostly        Objective:    BP Readings from Last 3  Encounters:  12/08/18 (!) 158/84  11/10/18 (!) 158/80  09/28/18 118/72   Wt Readings from Last 3 Encounters:  12/08/18 237 lb 4 oz (107.6 kg)  11/10/18 228 lb (103.4 kg)  09/28/18 221 lb 9.6 oz (100.5 kg)    BP (!) 158/84   Pulse 100   Temp 98.1 F (36.7 C)   Resp (!) 22   Ht 5\' 11"  (1.803 m)   Wt 237 lb 4 oz (107.6 kg)   SpO2 97%   BMI 33.09 kg/m    Physical Exam Constitutional:      General: He is not in acute distress.    Appearance: He is well-developed. He is not ill-appearing.  HENT:     Head: Normocephalic and atraumatic.     Right Ear: Tympanic membrane and ear canal normal.     Left Ear: Tympanic membrane and ear canal normal.     Nose: Mucosal edema and rhinorrhea present.     Right Sinus: No maxillary sinus tenderness or frontal sinus tenderness.     Left Sinus: No maxillary sinus tenderness or frontal sinus tenderness.     Mouth/Throat:     Pharynx: Uvula midline. Posterior oropharyngeal erythema present. No oropharyngeal exudate.     Tonsils: Swelling: 0 on the right. 0 on the left.  Neck:     Musculoskeletal: Neck supple.  Cardiovascular:     Rate and Rhythm: Normal rate and regular rhythm.     Heart sounds: No murmur.  Pulmonary:     Effort: No respiratory distress.     Breath sounds:  Wheezing (diffuse) present.  Lymphadenopathy:     Cervical: No cervical adenopathy.  Skin:    General: Skin is warm and dry.     Capillary Refill: Capillary refill takes less than 2 seconds.  Neurological:     Mental Status: He is alert.           Assessment & Plan:   Problem List Items Addressed This Visit      Respiratory   COPD exacerbation ([place]) - Primary   Relevant Medications   predniSONE (DELTASONE) 20 MG tablet   azithromycin (ZITHROMAX) 250 MG tablet     Symptoms most consistent with COPD exacerbation likely started by viral illness.   Advise follow-up with PCP if not improving   Return if symptoms worsen or fail to improve.  Lesleigh Noe, MD

## 2018-12-08 NOTE — Patient Instructions (Signed)
COPD exacerbation - continue using albuterol - start the antibiotic and prednisone and take for 5 days - if worsening or no improvement on treatment return for re-evaluation

## 2018-12-08 NOTE — Telephone Encounter (Signed)
Patient notified as instructed by telephone and verbalized understanding. Advised patient that Dr. Diona Browner does not have any openings today, but Dr. Einar Pheasant has several at this point, but they may be taken quickly. Patient stated that he will call and see if he has someone that can drive him for an appointment and will call back to schedule if he can find a ride.

## 2019-02-14 ENCOUNTER — Telehealth: Payer: Self-pay | Admitting: Family Medicine

## 2019-02-14 DIAGNOSIS — E785 Hyperlipidemia, unspecified: Secondary | ICD-10-CM

## 2019-02-14 DIAGNOSIS — M1A9XX0 Chronic gout, unspecified, without tophus (tophi): Secondary | ICD-10-CM

## 2019-02-14 DIAGNOSIS — R7303 Prediabetes: Secondary | ICD-10-CM

## 2019-02-14 NOTE — Telephone Encounter (Signed)
-----   Message from Ellamae Sia sent at 02/11/2019  2:52 PM EDT ----- Regarding: Lab orders for Monday, 4.27.20 Patient is scheduled for CPX labs, please order future labs, Thanks , Karna Christmas

## 2019-02-15 ENCOUNTER — Other Ambulatory Visit: Payer: Self-pay

## 2019-02-15 ENCOUNTER — Other Ambulatory Visit (INDEPENDENT_AMBULATORY_CARE_PROVIDER_SITE_OTHER): Payer: Medicare Other

## 2019-02-15 DIAGNOSIS — R7303 Prediabetes: Secondary | ICD-10-CM | POA: Diagnosis not present

## 2019-02-15 DIAGNOSIS — E785 Hyperlipidemia, unspecified: Secondary | ICD-10-CM

## 2019-02-15 DIAGNOSIS — M1A9XX0 Chronic gout, unspecified, without tophus (tophi): Secondary | ICD-10-CM | POA: Diagnosis not present

## 2019-02-15 LAB — COMPREHENSIVE METABOLIC PANEL
ALT: 18 U/L (ref 0–53)
AST: 22 U/L (ref 0–37)
Albumin: 4 g/dL (ref 3.5–5.2)
Alkaline Phosphatase: 70 U/L (ref 39–117)
BUN: 5 mg/dL — ABNORMAL LOW (ref 6–23)
CO2: 31 mEq/L (ref 19–32)
Calcium: 9.1 mg/dL (ref 8.4–10.5)
Chloride: 100 mEq/L (ref 96–112)
Creatinine, Ser: 0.79 mg/dL (ref 0.40–1.50)
GFR: 99.3 mL/min (ref >60.00)
Glucose, Bld: 109 mg/dL — ABNORMAL HIGH (ref 70–99)
Potassium: 4.4 mEq/L (ref 3.5–5.1)
Sodium: 137 mEq/L (ref 135–145)
Total Bilirubin: 1.3 mg/dL — ABNORMAL HIGH (ref 0.2–1.2)
Total Protein: 6.6 g/dL (ref 6.0–8.3)

## 2019-02-15 LAB — LIPID PANEL
Cholesterol: 136 mg/dL (ref 0–200)
HDL: 44.5 mg/dL (ref >39.00)
LDL Cholesterol: 71 mg/dL (ref 0–99)
NonHDL: 91.11
Total CHOL/HDL Ratio: 3
Triglycerides: 103 mg/dL (ref 0.0–149.0)
VLDL: 20.6 mg/dL (ref 0.0–40.0)

## 2019-02-15 LAB — URIC ACID: Uric Acid, Serum: 4.9 mg/dL (ref 4.0–7.8)

## 2019-02-15 LAB — HEMOGLOBIN A1C: Hgb A1c MFr Bld: 6.8 % — ABNORMAL HIGH (ref 4.6–6.5)

## 2019-02-18 ENCOUNTER — Encounter: Payer: Medicare Other | Admitting: Family Medicine

## 2019-02-18 ENCOUNTER — Ambulatory Visit (INDEPENDENT_AMBULATORY_CARE_PROVIDER_SITE_OTHER): Payer: Medicare Other | Admitting: Family Medicine

## 2019-02-18 ENCOUNTER — Encounter: Payer: Self-pay | Admitting: Family Medicine

## 2019-02-18 VITALS — Ht 71.0 in

## 2019-02-18 DIAGNOSIS — Z0000 Encounter for general adult medical examination without abnormal findings: Secondary | ICD-10-CM

## 2019-02-18 DIAGNOSIS — F321 Major depressive disorder, single episode, moderate: Secondary | ICD-10-CM | POA: Diagnosis not present

## 2019-02-18 DIAGNOSIS — E785 Hyperlipidemia, unspecified: Secondary | ICD-10-CM

## 2019-02-18 DIAGNOSIS — I10 Essential (primary) hypertension: Secondary | ICD-10-CM | POA: Diagnosis not present

## 2019-02-18 DIAGNOSIS — R7303 Prediabetes: Secondary | ICD-10-CM | POA: Diagnosis not present

## 2019-02-18 MED ORDER — CITALOPRAM HYDROBROMIDE 20 MG PO TABS: 20.0000 mg | ORAL_TABLET | Freq: Every day | ORAL | 1 refills | Status: DC

## 2019-02-18 NOTE — Assessment & Plan Note (Signed)
Worsened control recently.. pt will get back on track.. need re-eval in 3 months.. may have new Dx of DM. Will hold off on making official dx despite A1C > 6.5 given unusual circumstances with change in activity with pandemic.

## 2019-02-18 NOTE — Assessment & Plan Note (Signed)
Unclear control.Marland Kitchenget cuff and check at home.

## 2019-02-18 NOTE — Assessment & Plan Note (Signed)
Well controlled. Continue current medication. On statin. 

## 2019-02-18 NOTE — Progress Notes (Addendum)
VIRTUAL VISIT Due to national recommendations of social distancing due to [place] [address], a virtual visit is felt to be most appropriate for this patient at this time.   I connected with the patient on 02/18/19 at 11:20 AM EDT by virtual telehealth platform and verified that I am speaking with the correct person using two identifiers.   I discussed the limitations, risks, security and privacy concerns of performing an evaluation and management service by  virtual telehealth platform and the availability of in person appointments. I also discussed with the patient that there may be a patient responsible charge related to this service. The patient expressed understanding and agreed to proceed.  Patient location: Home Provider Location: [facility] Participants: Eliezer Lofts and [name]   Chief Complaint  Patient presents with  . Annual Exam    History of Present Illness: I have personally reviewed the Medicare Annual Wellness questionnaire and have noted 1. The patient's medical and social history 2. Their use of alcohol, tobacco or illicit drugs 3. Their current medications and supplements 4. The patient's functional ability including ADL's, fall risks, home safety risks and hearing or visual             impairment. 5. Diet and physical activities 6. Evidence for depression or mood disorders 7.         Updated provider list Cognitive evaluation was performed and recorded on pt medicare questionnaire form. The patients weight, height, BMI and visual acuity have been recorded in the chart  I have made referrals, counseling and provided education to the patient based review of the above and I have provided the pt with a written personalized care plan for preventive services.   Documentation of this information was scanned into the electronic record under the media tab.   Advance directives and end of life planning reviewed in detail with patient and documented in EMR. Patient  given handout on advance care directives if needed. HCPOA and living will updated if needed.   COPD/asthma/Chronic SOB poor control but stable on flovent, singulair,stiolto  followed by pulm  MDD: Well controlled on celexa  Hypertension:  Will check at home   BP Readings from Last 3 Encounters:  12/08/18 (!) 158/84  11/10/18 (!) 158/80  09/28/18 118/72  Using medication without problems or lightheadedness: none Chest pain with exertion:none Edema:  In last 1-2 months... swelling in feet Short of breath:none Average home BPs:has cuff not checking Other issues: Body mass index is 33.09 kg/m.   Elevated Cholesterol:  Great control on statin Lab Results  Component Value Date   CHOL 136 02/15/2019   HDL 44.50 02/15/2019   LDLCALC 71 02/15/2019   LDLDIRECT 53.0 08/23/2016   TRIG 103.0 02/15/2019   CHOLHDL 3 02/15/2019  \Using medications without problems: Muscle aches: none Diet compliance: moderate Exercise:minimal Other complaints:   prediabetes  Lab Results  Component Value Date   HGBA1C 6.8 (H) 02/15/2019     Fall Risk  02/18/2019 11/05/2016 09/25/2016  Falls in the past year? 0 No No     Office Visit from 02/18/2019 in [place] at [facility] Total Score  0     Could not perform hearing or vision screening du to virtual visit.   Advance directives and end of life planning reviewed in detail with patient and documented in EMR. Patient given handout on advance care directives if needed. HCPOA and living will updated if needed.   COVID 19 screen No recent travel  or known exposure to COVID19 The patient denies respiratory symptoms of COVID 19 at this time.  The importance of social distancing was discussed today.   Review of Systems  Constitutional: Negative for chills and fever.  HENT: Negative for congestion and ear pain.   Eyes: Negative for pain and redness.  Respiratory: Negative for cough and shortness of breath.   Cardiovascular:  Positive for leg swelling. Negative for chest pain and palpitations.  Gastrointestinal: Negative for abdominal pain, blood in stool, constipation, diarrhea, nausea and vomiting.  Genitourinary: Negative for dysuria.  Musculoskeletal: Negative for falls and myalgias.  Skin: Negative for rash.  Neurological: Negative for dizziness.  Psychiatric/Behavioral: Negative for depression. The patient is not nervous/anxious.       Past Medical History:  Diagnosis Date  . Achilles tendon rupture 10/11   and repair  . Adenomatous polyp of colon 04/06/2010   Adenomatous polyps 3 in the descending colon, times one in the ascending colon [facility])  . Asthma   . Gout   . Hemorrhoids   . Hypercholesteremia   . Hypertension   . Sleep apnea    doesn't wear CPAP  . Syncope 10/11   in settin gof asthma exacerbation w coughing. Ech (10/11): EF > 55%, mild LVH, grade I diastolic dysfunction, nomral RV size and systolic function,. normal valves. Carotid US (10/11): minimal disease    reports that he quit smoking about 23 years ago. His smoking use included cigarettes. He has a 8.75 pack-year smoking history. His smokeless tobacco use includes chew. He reports current alcohol use. He reports that he does not use drugs.   Current Outpatient Medications:  .  albuterol (VENTOLIN HFA) 108 (90 Base) MCG/ACT inhaler, Inhale 2 puffs into the lungs every 4 (four) hours as needed for wheezing or shortness of breath., Disp: 1 Inhaler, Rfl: 10 .  allopurinol (ZYLOPRIM) 300 MG tablet, Take 1 tablet (300 mg total) by mouth daily., Disp: 90 tablet, Rfl: 1 .  Cholecalciferol (VITAMIN D3) 1.25 MG (50000 UT) CAPS, Take 1 capsule by mouth once a week., Disp: 12 capsule, Rfl: 1 .  citalopram (CELEXA) 20 MG tablet, Take 1 tablet (20 mg total) by mouth daily., Disp: 30 tablet, Rfl: 5 .  fluticasone (FLOVENT HFA) 220 MCG/ACT inhaler, Inhale 2 puffs into the lungs 2 (two) times daily., Disp: 1 Inhaler, Rfl: 5 .  lisinopril  (PRINIVIL,ZESTRIL) 20 MG tablet, TAKE ONE (1) TABLET EACH DAY, Disp: 90 tablet, Rfl: 1 .  montelukast (SINGULAIR) 10 MG tablet, Take 1 tablet (10 mg total) by mouth daily., Disp: 90 tablet, Rfl: 1 .  Multiple Vitamin (MULTIVITAMIN WITH MINERALS) TABS tablet, Take 1 tablet by mouth daily., Disp: 30 tablet, Rfl: 2 .  simvastatin (ZOCOR) 20 MG tablet, TAKE ONE TABLET BY MOUTH EVERY DAY, Disp: 90 tablet, Rfl: 1 .  STIOLTO RESPIMAT 2.5-2.5 MCG/ACT AERS, Use two inhalations every day, Disp: 4 g, Rfl: 5 .  testosterone cypionate (DEPOTESTOTERONE CYPIONATE) 100 MG/ML injection, Inject 1 mL (100 mg total) into the muscle once a week., Disp: 10 mL, Rfl: 0   Observations/Objective: Height 5\' 11"  (1.803 m).  Physical Exam  Physical Exam Constitutional:      General: She is not in acute distress. Pulmonary:     Effort: Pulmonary effort is normal. No respiratory distress.  Neurological:     Mental Status: She is alert and oriented to person, place, and time.  Psychiatric:        Mood and Affect: Mood normal.  Behavior: Behavior normal.   Assessment and Plan The patient's preventative maintenance and recommended screening tests for an annual wellness exam were reviewed in full today. Brought up to date unless services declined.  Counselled on the importance of diet, exercise, and its role in overall health and mortality. The patient's FH and SH was reviewed, including their home life, tobacco status, and drug and alcohol status.    Vaccines:uptodate Prostate Cancer Screen:stable PSA 10/25/2018 Colon Cancer Screen: 08/2015 colonoscopy.. repeat in 10 years      Smoking Status:nonsmoker ETOH/ drug OVA:NVBT  Hep C: neg  HIV screen:  neg  Hyperlipidemia LDL goal <70 Well controlled. Continue current medication.  On statin.  Essential hypertension, benign Unclear control.Marland Kitchenget cuff and check at home.   MDD (major depressive disorder), single episode, moderate (HCC) Stable control on  celexa.  Prediabetes Worsened control recently.. pt will get back on track.. need re-eval in 3 months.. may have new Dx of DM. Will hold off on making official dx despite A1C > 6.5 given unusual circumstances with change in activity with pandemic.       I discussed the assessment and treatment plan with the patient. The patient was provided an opportunity to ask questions and all were answered. The patient agreed with the plan and demonstrated an understanding of the instructions.   The patient was advised to call back or seek an in-person evaluation if the symptoms worsen or if the condition fails to improve as anticipated.     Eliezer Lofts, MD

## 2019-02-18 NOTE — Patient Instructions (Addendum)
Work on low [employer]. Stop sweet tea.  Exercise as able.  Check blood pressure at home. Call with measurements.  Decrease salt at home.  Elevate legs above your heart.  Wear compression hose daily.  If peripheral swelling not improving in 2 weeks make follow up appt to reassess.

## 2019-02-18 NOTE — Assessment & Plan Note (Signed)
Stable control on celexa. 

## 2019-02-19 NOTE — Progress Notes (Signed)
Labs 11/9 Follow up 11/10 Pt aware

## 2019-02-22 ENCOUNTER — Telehealth: Payer: Self-pay

## 2019-02-24 ENCOUNTER — Other Ambulatory Visit: Payer: Self-pay

## 2019-02-24 MED ORDER — TESTOSTERONE CYPIONATE 100 MG/ML IM SOLN: 100.0000 mg | INTRAMUSCULAR | 0 refills | Status: DC

## 2019-02-24 NOTE — Telephone Encounter (Signed)
He has an appointment on 6/4.  I sent in a 1 month supply.

## 2019-02-24 NOTE — Telephone Encounter (Signed)
As long as he keeps his 1 month follow-up can use the 10 mL.

## 2019-02-24 NOTE — Telephone Encounter (Signed)
Pharmacy called stating that they cannot get 1 ml vials at this time they can only get 76ml the 1 ml are on back order. Is this ok to fill or does the patient need to try another pharmacy

## 2019-02-25 MED ORDER — TESTOSTERONE CYPIONATE 200 MG/ML IM SOLN: 100.0000 mg | INTRAMUSCULAR | 0 refills | Status: DC

## 2019-02-25 NOTE — Telephone Encounter (Signed)
Pharmacy needed an update on medication they cannot fill test 100mg  per ml but can 200mg  per ml. Dose changed to 0.5mg  of the 200mg 

## 2019-03-09 NOTE — Telephone Encounter (Signed)
Fax sent from [employer] (phone [phone], fax # [phone])  Prior authorization required for Depo-Testosterone 200mg /ml IM.  Call [phone].

## 2019-03-10 ENCOUNTER — Telehealth: Payer: Self-pay | Admitting: Urology

## 2019-03-10 NOTE — Telephone Encounter (Signed)
Pt called asking about his testosterone.  He pays out of pocket and wants to know if PA is necessary.  Please call pt.  Will pharmacy fill without a PA?

## 2019-03-11 NOTE — Telephone Encounter (Signed)
He was only given a limited refill because he is due for labs/visit.  He has an appointment scheduled on 6/4.  If goes ahead and gets his labs drawn now; testosterone, PSA, hematocrit, I will do the 10 mL refill

## 2019-03-11 NOTE — Telephone Encounter (Signed)
I spoke to pharmacist at [facility], She states he can pay for this medication out of pocket however the cost for 2 83ml vials is 74.42 and a 43ml vial is 70.00. She states it is more cost effective for him to get the 42ml vial. He was last seen in office on 09/23/2018, his labs were last drawn on 10/26/2018. Please advise.

## 2019-03-16 ENCOUNTER — Other Ambulatory Visit: Payer: Self-pay

## 2019-03-16 ENCOUNTER — Other Ambulatory Visit: Payer: Self-pay | Admitting: Family Medicine

## 2019-03-16 ENCOUNTER — Other Ambulatory Visit: Payer: Medicare Other

## 2019-03-16 DIAGNOSIS — Z125 Encounter for screening for malignant neoplasm of prostate: Secondary | ICD-10-CM | POA: Diagnosis not present

## 2019-03-16 DIAGNOSIS — E291 Testicular hypofunction: Secondary | ICD-10-CM | POA: Diagnosis not present

## 2019-03-16 NOTE — Telephone Encounter (Signed)
Patient notified and will come in for labs.

## 2019-03-17 LAB — PSA: Prostate Specific Ag, Serum: 1.5 ng/mL (ref 0.0–4.0)

## 2019-03-17 LAB — TESTOSTERONE: Testosterone: 885 ng/dL (ref 264–916)

## 2019-03-20 ENCOUNTER — Other Ambulatory Visit: Payer: Self-pay | Admitting: Family Medicine

## 2019-03-22 ENCOUNTER — Other Ambulatory Visit: Payer: Self-pay | Admitting: Family Medicine

## 2019-03-22 ENCOUNTER — Telehealth: Payer: Self-pay

## 2019-03-22 NOTE — Telephone Encounter (Signed)
Patient notified and voiced understanding.

## 2019-03-22 NOTE — Telephone Encounter (Signed)
-----   Message from Abbie Sons, MD sent at 03/20/2019  2:30 PM EDT ----- PSA has significantly increased from 0.4-1.5.  Will need to discuss prostate biopsy versus MRI at June appointment.

## 2019-03-25 ENCOUNTER — Ambulatory Visit: Payer: BLUE CROSS/BLUE SHIELD | Admitting: Urology

## 2019-04-06 ENCOUNTER — Other Ambulatory Visit: Payer: Self-pay

## 2019-04-06 ENCOUNTER — Ambulatory Visit (INDEPENDENT_AMBULATORY_CARE_PROVIDER_SITE_OTHER): Payer: Medicare Other | Admitting: Urology

## 2019-04-06 ENCOUNTER — Encounter: Payer: Self-pay | Admitting: Urology

## 2019-04-06 VITALS — BP 146/89 | HR 98 | Ht 71.0 in | Wt 245.8 lb

## 2019-04-06 DIAGNOSIS — E291 Testicular hypofunction: Secondary | ICD-10-CM | POA: Diagnosis not present

## 2019-04-07 ENCOUNTER — Encounter: Payer: Self-pay | Admitting: Urology

## 2019-04-07 MED ORDER — TESTOSTERONE CYPIONATE 200 MG/ML IM SOLN: 100.0000 mg | INTRAMUSCULAR | 0 refills | Status: DC

## 2019-04-07 NOTE — Progress Notes (Signed)
04/06/2019 7:47 AM   Cody Day September 22, 1956 [id number]  Referring provider: [name], MD [address] [place],  [place] [address]  Chief Complaint  Patient presents with  . Hypogonadism    HPI: 63 year-old male presents for follow-up of hypogonadism.  I initially saw him in December 2019.  Symptoms included significant tiredness and fatigue.  He is injecting 100 mg testosterone cypionate weekly.  He has good energy level.  Denies bothersome lower urinary tract symptoms though he has occasional intermittent urinary stream.  Denies breast tenderness/enlargement or edema.  Lab work drawn on 03/16/2019 remarkable for a testosterone level of 885 and a PSA of 1.5.  His PSA is elevated above baseline which is around 0.5.   PMH: Past Medical History:  Diagnosis Date  . Achilles tendon rupture 10/11   and repair  . Adenomatous polyp of colon 04/06/2010   Adenomatous polyps 3 in the descending colon, times one in the ascending colon [facility])  . Asthma   . Gout   . Hemorrhoids   . Hypercholesteremia   . Hypertension   . Sleep apnea    doesn't wear CPAP  . Syncope 10/11   in settin gof asthma exacerbation w coughing. Ech (10/11): EF > 55%, mild LVH, grade I diastolic dysfunction, nomral RV size and systolic function,. normal valves. Carotid US (10/11): minimal disease    Surgical History: Past Surgical History:  Procedure Laterality Date  . ABDOMINAL HERNIA REPAIR  1998,2000  . calcium kidney stones    . [facility], [name], [place]; Dr. [name]. Multiple adenomatous polyps (total 4).   . COLONOSCOPY WITH PROPOFOL N/A 08/30/2015   Procedure: COLONOSCOPY WITH PROPOFOL;  Surgeon: [name], MD;  Location: [facility] ENDOSCOPY;  Service: Endoscopy;  Laterality: N/A;  . FEMORAL HERNIA REPAIR  2007  . HEMORRHOID SURGERY    . HERNIA REPAIR  09/10/2010   Repair of recurrent ventral  hernia, resection of previously placed mesh x2, implantation 7.5 x 10 cm Gore-Tex dual mesh in an underlay position, repair of epigastric hernia with a 4.2 cm Proceed ventral patch.  Marland Kitchen HERNIA REPAIR   02//28/2007   Laparoscopic right inguinal hernia repair with Surgipro mesh, Ventrilex patch at umbilicus  . HERNIA REPAIR  11/01/1990  .   Small oval Kugel patch placed in preperitoneal space, [name], MD  . HERNIA REPAIR  01/17/1997   Recurrent ventral hernia 15 x 19 Gore-Tex dual mesh placed laparoscopically with multiple lefft--sided ports, [name], MD  . HERNIA REPAIR  01/07/1995    Primary repair of ventral hernia. [name], MD  . intestinal blockage     as a child  . KIDNEY STONE extraction     unic acid stones  . left heart cath  2008   minimal luminal irregularities EF 55%  . RIGHT/LEFT HEART CATH AND CORONARY ANGIOGRAPHY N/A 01/13/2017   Procedure: Right/Left Heart Cath and Coronary Angiography;  Surgeon: [name], MD;  Location: [facility];  Service: Cardiovascular;  Laterality: N/A;  . treadmill stress test  2003    Home Medications:  Allergies as of 04/06/2019      Reactions   Levaquin [levofloxacin In D5w]    Severe headaches, skin tingling/redness   Penicillins Hives, Rash   Has patient had a PCN reaction causing immediate rash, facial/tongue/throat swelling, SOB or lightheadedness with hypotension: Yes Has  patient had a PCN reaction causing severe rash involving mucus membranes or skin necrosis: Unknown Has patient had a PCN reaction that required hospitalization: No Has patient had a PCN reaction occurring within the last 10 years: No - Childhood reaction If all of the above answers are "NO", then may proceed with Cephalosporin use.      Medication List       Accurate as of April 06, 2019 11:59 PM. If you have any questions, ask your nurse or doctor.        albuterol 108 (90 Base) MCG/ACT inhaler Commonly known as: Ventolin HFA Inhale 2 puffs  into the lungs every 4 (four) hours as needed for wheezing or shortness of breath.   allopurinol 300 MG tablet Commonly known as: ZYLOPRIM Take 1 tablet (300 mg total) by mouth daily.   citalopram 20 MG tablet Commonly known as: CeleXA Take 1 tablet (20 mg total) by mouth daily.   fluticasone 220 MCG/ACT inhaler Commonly known as: FLOVENT HFA Inhale 2 puffs into the lungs 2 (two) times daily.   lisinopril 20 MG tablet Commonly known as: ZESTRIL TAKE ONE (1) TABLET EACH DAY   montelukast 10 MG tablet Commonly known as: SINGULAIR Take 1 tablet (10 mg total) by mouth daily.   multivitamin with minerals Tabs tablet Take 1 tablet by mouth daily.   simvastatin 20 MG tablet Commonly known as: ZOCOR TAKE 1 TABLET BY MOUTH DAILY   Stiolto Respimat 2.5-2.5 MCG/ACT Aers Generic drug: Tiotropium Bromide-Olodaterol Use two inhalations every day   testosterone cypionate 200 MG/ML injection Commonly known as: DEPOTESTOSTERONE CYPIONATE Inject 0.5 mLs (100 mg total) into the muscle once a week. 0.5 ml once weekly   Vitamin D3 1.25 MG (50000 UT) Caps Take 1 capsule by mouth once a week.       Allergies:  Allergies  Allergen Reactions  . Levaquin [Levofloxacin In D5w]     Severe headaches, skin tingling/redness  . Penicillins Hives and Rash    Has patient had a PCN reaction causing immediate rash, facial/tongue/throat swelling, SOB or lightheadedness with hypotension: Yes Has patient had a PCN reaction causing severe rash involving mucus membranes or skin necrosis: Unknown Has patient had a PCN reaction that required hospitalization: No Has patient had a PCN reaction occurring within the last 10 years: No - Childhood reaction If all of the above answers are "NO", then may proceed with Cephalosporin use.     Family History: Family History  Problem Relation Age of Onset  . Hypertension Father   . Cataracts Father   . Diabetes Father   . Heart attack Mother 63  . Coronary  artery disease Mother   . Dementia Mother   . Hypertension Sister   . Depression Sister   . Hypertension Brother   . Colon cancer Brother   . Diabetes Other        1st degree relative  . Colon cancer Maternal Aunt     Social History:  reports that he quit smoking about 23 years ago. His smoking use included cigarettes. He has a 8.75 pack-year smoking history. His smokeless tobacco use includes chew. He reports current alcohol use. He reports that he does not use drugs.  ROS: UROLOGY Frequent Urination?: No Hard to postpone urination?: No Burning/pain with urination?: No Get up at night to urinate?: Yes Leakage of urine?: No Urine stream starts and stops?: Yes Trouble starting stream?: No Do you have to strain to urinate?: No Blood in urine?: No Urinary  tract infection?: No Sexually transmitted disease?: No Injury to kidneys or bladder?: No Painful intercourse?: No Weak stream?: No Erection problems?: No Penile pain?: No  Gastrointestinal Nausea?: No Vomiting?: No Indigestion/heartburn?: No Diarrhea?: No Constipation?: No  Constitutional Fever: No Night sweats?: No Weight loss?: No Fatigue?: No  Skin Skin rash/lesions?: No Itching?: No  Eyes Blurred vision?: No Double vision?: No  Ears/Nose/Throat Sore throat?: No Sinus problems?: No  Hematologic/Lymphatic Swollen glands?: No Easy bruising?: Yes  Cardiovascular Leg swelling?: Yes Chest pain?: No  Respiratory Cough?: No Shortness of breath?: Yes  Endocrine Excessive thirst?: Yes  Musculoskeletal Back pain?: Yes Joint pain?: Yes  Neurological Headaches?: No Dizziness?: Yes  Psychologic Depression?: Yes Anxiety?: No  Physical Exam: BP (!) 146/89   Pulse 98   Ht 5\' 11"  (1.803 m)   Wt 245 lb 12.8 oz (111.5 kg)   BMI 34.28 kg/m   Constitutional:  Alert and oriented, No acute distress. HEENT: Foley AT, moist mucus membranes.  Trachea midline, no masses. Cardiovascular: No clubbing,  cyanosis, or edema. Respiratory: Normal respiratory effort, no increased work of breathing. GI: Abdomen is soft, nontender, nondistended, no abdominal masses GU: No CVA tenderness.  Prostate 40 g, smooth without nodules Lymph: No cervical or inguinal lymphadenopathy. Skin: No rashes, bruises or suspicious lesions. Neurologic: Grossly intact, no focal deficits, moving all 4 extremities. Psychiatric: Normal mood and affect.   Assessment & Plan:   63 year old male doing well on TRT.  His PSA is elevated above baseline.  Will repeat in approximately 6 weeks and if still elevated will need to discuss prostate MRI/biopsy.  Testosterone was refilled  Return in about 6 months (around 10/06/2019) for Recheck, labs.   Abbie Sons, [place] [address] [place], [place] [address] [phone]

## 2019-04-16 ENCOUNTER — Other Ambulatory Visit: Payer: Self-pay | Admitting: Family Medicine

## 2019-04-16 NOTE — Telephone Encounter (Signed)
Last office visit 02/18/2019 for [place].  Last refilled 10/29/2018 for #12 with 1 refill.  Last Vit D level?

## 2019-04-27 ENCOUNTER — Telehealth: Payer: Self-pay | Admitting: Internal Medicine

## 2019-04-27 NOTE — Telephone Encounter (Signed)
Ok to proceed with appt. As scheduled.

## 2019-04-27 NOTE — Telephone Encounter (Signed)
Called patient for COVID-19 pre-screening for in office visit.  Have you recently traveled any where out of the local area in the last 2 weeks? No  Have you been in close contact with a person diagnosed with COVID-19 or someone awaiting results within the last 2 weeks? No  Do you currently have any of the following symptoms? If so, when did they start? Cough (Yes- one month)  Diarrhea                         Joint Pain Fever      Muscle Pain   Red eyes Shortness of breath   Abdominal pain  Vomiting Loss of smell    Rash    Sore Throat Headache    Weakness   Bruising or bleeding   Okay to proceed with visit 04/28/2019

## 2019-04-28 ENCOUNTER — Other Ambulatory Visit: Payer: Self-pay

## 2019-04-28 ENCOUNTER — Encounter: Payer: Self-pay | Admitting: Internal Medicine

## 2019-04-28 ENCOUNTER — Ambulatory Visit (INDEPENDENT_AMBULATORY_CARE_PROVIDER_SITE_OTHER): Payer: Medicare Other | Admitting: Internal Medicine

## 2019-04-28 VITALS — BP 160/88 | HR 96 | Temp 98.0°F | Ht 71.0 in | Wt 240.6 lb

## 2019-04-28 DIAGNOSIS — I739 Peripheral vascular disease, unspecified: Secondary | ICD-10-CM | POA: Diagnosis not present

## 2019-04-28 DIAGNOSIS — R42 Dizziness and giddiness: Secondary | ICD-10-CM | POA: Diagnosis not present

## 2019-04-28 NOTE — Patient Instructions (Signed)
REFERRAL TO NEUROLOGY FOR VERTIGO REFERRAL TO VASCULAR SURGERY FOR PAD  CONTINUE OXYGEN AS PRESCRIBED CONTINUE INHALERS AS PRESCRIBED

## 2019-04-28 NOTE — Progress Notes (Signed)
Pulmonary function testing shows moderate  Name: Cody Day MRN: [id number] DOB: 1955-11-21     CONSULTATION DATE: 12.9.19 REFERRING MD : Diona Browner  CHIEF COMPLAINT: follow up SOB and DOE  STUDIES:     9.30.10 CXR independently reviewed by Me today Low lung volumes No pneumonia   Synopsis: Cody Day first saw the [facility] pulmonary clinic in the spring of 2015 for shortness of breath, cough, mucus production. He had lung function testing in 2014 which was completely normal. He had a past history significant for long-term inhaled illicit drug use with multiple substances including methamphetamine/crack cocaine. Patient has smoked multiple substances in the past including crack marijuana smoking cigarettes Patient also has a history of alcohol abuse  Later PFT findings have shown airflow obstruction.    HISTORY OF PRESENT ILLNESS:  Pulmonary function testing shows moderate obstructive lung disease Findings relayed to patient  Overnight pulse oximetry reveals nocturnal hypoxia patient currently on 1 L oxygen therapy at night  6-minute walk test did not reveal exertional hypoxia  Remains short of breath and has dyspnea on exertion which seems to be stable at this time Patient uses inhalers as prescribed  No signs of infection at this time No signs of COPD exacerbation at this time    Patient complains mostly of vertigo Complains of peripheral artery disease and bilateral lower extremity leg pain with varicose veins   PAST MEDICAL HISTORY :   has a past medical history of Achilles tendon rupture (10/11), Adenomatous polyp of colon (04/06/2010), Asthma, Gout, Hemorrhoids, Hypercholesteremia, Hypertension, Sleep apnea, and Syncope (10/11).  has a past surgical history that includes treadmill stress test (2003); intestinal blockage; Abdominal hernia repair (4854,6270); Femoral hernia repair (2007); left heart cath (2008); KIDNEY STONE extraction; calcium kidney  stones; Colonoscopy (2011); Hemorrhoid surgery; Hernia repair (09/10/2010); Hernia repair ( 02//28/2007); Hernia repair (11/01/1990  .); Hernia repair (01/17/1997); Hernia repair (01/07/1995 ); Cardiac catheterization; Colonoscopy with propofol (N/A, 08/30/2015); and RIGHT/LEFT HEART CATH AND CORONARY ANGIOGRAPHY (N/A, 01/13/2017). Prior to Admission medications   Medication Sig Start Date End Date Taking? Authorizing Provider  albuterol (PROVENTIL) (2.5 MG/3ML) 0.083% nebulizer solution Take 3 mLs (2.5 mg total) by nebulization every 4 (four) hours as needed for wheezing or shortness of breath. 04/17/18   Bedsole, Amy E, MD  allopurinol (ZYLOPRIM) 300 MG tablet Take 1 tablet (300 mg total) by mouth daily. 06/09/18   Bedsole, Amy E, MD  Cholecalciferol (VITAMIN D3) 50000 units CAPS Take 1 capsule by mouth once a week. 06/09/18   Bedsole, Amy E, MD  citalopram (CELEXA) 20 MG tablet Take 1 tablet (20 mg total) by mouth daily. 07/22/18 10/20/18  Gladstone Lighter, MD  fluticasone (FLOVENT HFA) 220 MCG/ACT inhaler Inhale 2 puffs into the lungs 2 (two) times daily.    [provider]  lisinopril (PRINIVIL,ZESTRIL) 20 MG tablet TAKE ONE (1) TABLET EACH DAY 06/12/18   Bedsole, Amy E, MD  montelukast (SINGULAIR) 10 MG tablet Take 1 tablet (10 mg total) by mouth daily. 06/09/18 06/09/19  [name], MD  Multiple Vitamin (MULTIVITAMIN WITH MINERALS) TABS tablet Take 1 tablet by mouth daily. 07/22/18   Gladstone Lighter, MD  PROAIR HFA 108 [phone] Base) MCG/ACT inhaler Use 2 inhalations every four to six hours as needed 09/14/18   Diona Browner, Amy E, MD  simvastatin (ZOCOR) 20 MG tablet TAKE ONE TABLET BY MOUTH EVERY DAY 09/23/18   [name], MD  STIOLTO RESPIMAT 2.5-2.5 MCG/ACT AERS Use two inhalations every day 09/14/18  Bedsole, Amy E, MD  testosterone cypionate (DEPOTESTOTERONE CYPIONATE) 100 MG/ML injection Inject 1 mL (100 mg total) into the muscle once a week. 09/23/18   Abbie Sons, MD   Allergies   Allergen Reactions  . Levaquin [Levofloxacin In D5w]     Severe headaches, skin tingling/redness  . Penicillins Hives and Rash    Has patient had a PCN reaction causing immediate rash, facial/tongue/throat swelling, SOB or lightheadedness with hypotension: Yes Has patient had a PCN reaction causing severe rash involving mucus membranes or skin necrosis: Unknown Has patient had a PCN reaction that required hospitalization: No Has patient had a PCN reaction occurring within the last 10 years: No - Childhood reaction If all of the above answers are "NO", then may proceed with Cephalosporin use.     SOCIAL HISTORY:  reports that he quit smoking about 23 years ago. His smoking use included cigarettes. He has a 8.75 pack-year smoking history. His smokeless tobacco use includes chew. He reports current alcohol use. He reports that he does not use drugs.   Review of Systems:  Gen:  Denies  fever, sweats, chills weigh loss  HEENT: Denies blurred vision, double vision, ear pain, eye pain, hearing loss, nose bleeds, sore throat Cardiac:  No dizziness, chest pain or heaviness, chest tightness,edema, No JVD Resp:   No cough, -sputum production, +shortness of breath,+wheezing, -hemoptysis,  Gi: Denies swallowing difficulty, - stomach pain, nausea or vomiting, diarrhea, constipation, bowel incontinence Gu:  Denies bladder incontinence, burning urine Ext:   Denies Joint pain, stiffness or swelling Skin: Denies  skin rash, easy bruising or bleeding or hives Endoc:  Denies polyuria, polydipsia , polyphagia or weight change Psych:   Denies depression, insomnia or hallucinations  Other:  All other systems negative   BP (!) 160/88 (BP Location: Left Arm, Cuff Size: Normal)   Pulse 96   Temp 98 F (36.7 C) (Skin)   Ht 5\' 11"  (1.803 m)   Wt 240 lb 9.6 oz (109.1 kg)   SpO2 97%   BMI 33.56 kg/m   Physical Examination:   GENERAL:NAD, no fevers, chills, no weakness no fatigue HEAD: Normocephalic,  atraumatic.  EYES: PERLA, EOMI No scleral icterus.  NECK: Supple. No thyromegaly.  No JVD.  PULMONARY: CTA B/L no wheezing, rhonchi, crackles CARDIOVASCULAR: S1 and S2. Regular rate and rhythm. No murmurs GASTROINTESTINAL: Soft, nontender, nondistended. Positive bowel sounds.  MUSCULOSKELETAL: No swelling, clubbing, or edema.  NEUROLOGIC: No gross focal neurological deficits. 5/5 strength all extremities SKIN: No ulceration, lesions, rashes, or cyanosis.  PSYCHIATRIC: Insight, judgment intact. -depression -anxiety ALL OTHER ROS ARE NEGATIVE         ASSESSMENT / PLAN:  63 year old white male seen today for follow-up chronic persistent shortness of breath with a history of previous smoking history and polysubstance abuse in the setting of asthma and COPD in conjunction with deconditioned state with evidence of vertigo and peripheral artery disease in the setting of chronic hypoxic respiratory failure   Shortness of breath and dyspnea exertion related to asthma and COPD and deconditioned state Some point patient will need pulmonary rehab referral    Asthma COPD Continue inhalers as prescribed Flovent HFA twice daily Stiolto as prescribed Albuterol as needed No signs of exacerbation at this time No indication for steroids or antibiotics at this time   Chronic hypoxic respiratory failure from COPD Continue oxygen as prescribed Patient uses and benefits from oxygen therapy  Obesity -recommend significant weight loss -recommend changing diet  Deconditioned state -Recommend  increased daily activity and exercise Will need to consider pulm rehab assessment   PAD-follow up vascular surgery  OSA-non-complaint with CPAP  VERTIGO-referral to Neurology  COVID-19 EDUCATION: The signs and symptoms of COVID-19 were discussed with the patient and how to seek care for testing.  The importance of social distancing was discussed today. Hand Washing Techniques and avoid touching  face was advised.  MEDICATION ADJUSTMENTS/LABS AND TESTS ORDERED:    CURRENT MEDICATIONS REVIEWED AT LENGTH WITH PATIENT TODAY   Patient satisfied with Plan of action and management. All questions answered  Follow up in 6 months   Akshaya Toepfer Patricia Pesa, M.D.  [facility]  Medical Director [place] Director [facility] Cardio-Pulmonary Department

## 2019-04-29 ENCOUNTER — Other Ambulatory Visit: Payer: Medicare Other

## 2019-04-29 DIAGNOSIS — Z125 Encounter for screening for malignant neoplasm of prostate: Secondary | ICD-10-CM

## 2019-04-29 DIAGNOSIS — R972 Elevated prostate specific antigen [PSA]: Secondary | ICD-10-CM

## 2019-04-29 DIAGNOSIS — E291 Testicular hypofunction: Secondary | ICD-10-CM | POA: Diagnosis not present

## 2019-04-30 ENCOUNTER — Telehealth: Payer: Self-pay | Admitting: Family Medicine

## 2019-04-30 ENCOUNTER — Other Ambulatory Visit: Payer: Medicare Other

## 2019-04-30 DIAGNOSIS — E291 Testicular hypofunction: Secondary | ICD-10-CM

## 2019-04-30 LAB — PSA: Prostate Specific Ag, Serum: 1.8 ng/mL (ref 0.0–4.0)

## 2019-04-30 LAB — TESTOSTERONE: Testosterone: >1500 ng/dL — ABNORMAL HIGH (ref 264–916)

## 2019-04-30 NOTE — Telephone Encounter (Signed)
LMOM for patient to return call.

## 2019-04-30 NOTE — Telephone Encounter (Signed)
-----   Message from Abbie Sons, MD sent at 04/30/2019 11:25 AM EDT ----- PSA remains elevated at 1.8 and t level was > 1500.  When was his last injection?

## 2019-04-30 NOTE — Telephone Encounter (Signed)
Patient returned call. Advised of results. Patient states his last injection was Nancy Fetter 04/25/19. Will advise Dr Bernardo Heater.

## 2019-05-04 ENCOUNTER — Encounter (INDEPENDENT_AMBULATORY_CARE_PROVIDER_SITE_OTHER): Payer: Self-pay | Admitting: Vascular Surgery

## 2019-05-04 ENCOUNTER — Other Ambulatory Visit: Payer: Self-pay

## 2019-05-04 ENCOUNTER — Ambulatory Visit (INDEPENDENT_AMBULATORY_CARE_PROVIDER_SITE_OTHER): Payer: Medicare Other | Admitting: Vascular Surgery

## 2019-05-04 VITALS — BP 133/79 | HR 101 | Resp 18 | Ht 71.0 in | Wt 240.0 lb

## 2019-05-04 DIAGNOSIS — M79604 Pain in right leg: Secondary | ICD-10-CM

## 2019-05-04 DIAGNOSIS — M79609 Pain in unspecified limb: Secondary | ICD-10-CM | POA: Insufficient documentation

## 2019-05-04 DIAGNOSIS — E785 Hyperlipidemia, unspecified: Secondary | ICD-10-CM | POA: Diagnosis not present

## 2019-05-04 DIAGNOSIS — F17220 Nicotine dependence, chewing tobacco, uncomplicated: Secondary | ICD-10-CM | POA: Diagnosis not present

## 2019-05-04 DIAGNOSIS — Z79899 Other long term (current) drug therapy: Secondary | ICD-10-CM

## 2019-05-04 DIAGNOSIS — I83813 Varicose veins of bilateral lower extremities with pain: Secondary | ICD-10-CM | POA: Diagnosis not present

## 2019-05-04 DIAGNOSIS — I10 Essential (primary) hypertension: Secondary | ICD-10-CM

## 2019-05-04 DIAGNOSIS — M79605 Pain in left leg: Secondary | ICD-10-CM | POA: Diagnosis not present

## 2019-05-04 DIAGNOSIS — F1011 Alcohol abuse, in remission: Secondary | ICD-10-CM | POA: Diagnosis not present

## 2019-05-04 NOTE — Telephone Encounter (Signed)
Would hold next injection and repeat the testosterone level 5-6 days later.  He also needs an appointment to discuss prostate MRI/biopsy if he desires to continue testosterone

## 2019-05-04 NOTE — Assessment & Plan Note (Signed)
blood pressure control important in reducing the progression of atherosclerotic disease. On appropriate oral medications.  

## 2019-05-04 NOTE — Assessment & Plan Note (Signed)
The patient has lower extremity pain that is not entirely clear in its etiology.  He clearly has a neuropathic component by the description of the pain, but this could be driven by arterial insufficiency, previous alcohol use, or elevated sugars with a hemoglobin A1c of 8.  He has not had an arterial assessment I would certainly recommend assessment for PAD with noninvasive studies in the near future at his convenience.  I discussed the pathophysiology and natural history of peripheral arterial disease.  We will see him back following the studies to discuss the results and determine further treatment options

## 2019-05-04 NOTE — Assessment & Plan Note (Signed)
lipid control important in reducing the progression of atherosclerotic disease. Continue statin therapy  

## 2019-05-04 NOTE — Assessment & Plan Note (Signed)
Could be a risk factor for the development of neuropathy

## 2019-05-04 NOTE — Assessment & Plan Note (Signed)
The patient has prominent varicosities bilaterally that may be contributing to his lower extremity symptoms.  I discussed the pathophysiology and natural history of venous disease.  Compression stockings, elevation, and anti-inflammatories as needed for the discomfort will be recommended.  Venous reflux study will be done in the near future at his convenience.  I will see the patient back following the study to discuss the results and determine further treatment options

## 2019-05-04 NOTE — Patient Instructions (Signed)
Peripheral Vascular Disease  Peripheral vascular disease (PVD) is a disease of the blood vessels that are not part of your heart and brain. A simple term for PVD is poor circulation. In most cases, PVD narrows the blood vessels that carry blood from your heart to the rest of your body. This can reduce the supply of blood to your arms, legs, and internal organs, like your stomach or kidneys. However, PVD most often affects a person's lower legs and feet. Without treatment, PVD tends to get worse. PVD can also lead to acute ischemic limb. This is when an arm or leg suddenly cannot get enough blood. This is a medical emergency. Follow these instructions at home: Lifestyle  Do not use any products that contain nicotine or tobacco, such as cigarettes and e-cigarettes. If you need help quitting, ask your doctor.  Lose weight if you are overweight. Or, stay at a healthy weight as told by your doctor.  Eat a diet that is low in fat and cholesterol. If you need help, ask your doctor.  Exercise regularly. Ask your doctor for activities that are right for you. General instructions  Take over-the-counter and prescription medicines only as told by your doctor.  Take good care of your feet: ? Wear comfortable shoes that fit well. ? Check your feet often for any cuts or sores.  Keep all follow-up visits as told by your doctor This is important. Contact a doctor if:  You have cramps in your legs when you walk.  You have leg pain when you are at rest.  You have coldness in a leg or foot.  Your skin changes.  You are unable to get or have an erection (erectile dysfunction).  You have cuts or sores on your feet that do not heal. Get help right away if:  Your arm or leg turns cold, numb, and blue.  Your arms or legs become red, warm, swollen, painful, or numb.  You have chest pain.  You have trouble breathing.  You suddenly have weakness in your face, arm, or leg.  You become very  confused or you cannot speak.  You suddenly have a very bad headache.  You suddenly cannot see. Summary  Peripheral vascular disease (PVD) is a disease of the blood vessels.  A simple term for PVD is poor circulation. Without treatment, PVD tends to get worse.  Treatment may include exercise, low fat and low cholesterol diet, and quitting smoking. This information is not intended to replace advice given to you by your health care provider. Make sure you discuss any questions you have with your health care provider. Document Released: 01/01/2010 Document Revised: 09/19/2017 Document Reviewed: 11/14/2016 Elsevier Patient Education  2020 [employer]  

## 2019-05-04 NOTE — Progress Notes (Signed)
Patient ID: Cody Day, male   DOB: 13-Dec-1955, 63 y.o.   MRN: [id number]  Chief Complaint  Patient presents with  . New Patient (Initial Visit)    PAD    HPI Cody Day is a 63 y.o. male.  I am asked to see the patient by Dr. Mortimer Fries for evaluation of leg pain, numbness and possible PAD.  The patient has a longstanding history of issues with his legs dating back to an injury to his left foot as a child.  He has had prominent varicosities which have been increasing over time.  He reports no known history of DVT or superficial thrombophlebitis.  His biggest complaint in his legs are of neuropathic type pain with burning and tingling in his feet and lower legs with marked sensitivity to touch.  He has a markedly elevated hemoglobin A1c and says after he stopped drinking alcohol last year, he began drinking a lot of sweet tea and using a lot of sugars which seem to have elevated his blood sugars.  He reports 2 previous heart catheterizations and issues with his heart, but no known evaluation or treatment for peripheral arterial disease in his legs.  Both legs have had swelling for many years.  He has prominent varicosities in both legs.  No open ulcerations or infection.  There is no clear inciting event or causative factor that started the symptoms.  Elevation helps the swelling a little but nothing really helps the pain.     Past Medical History:  Diagnosis Date  . Achilles tendon rupture 10/11   and repair  . Adenomatous polyp of colon 04/06/2010   Adenomatous polyps 3 in the descending colon, times one in the ascending colon [facility])  . Asthma   . Gout   . Hemorrhoids   . Hypercholesteremia   . Hypertension   . Sleep apnea    doesn't wear CPAP  . Syncope 10/11   in settin gof asthma exacerbation w coughing. Ech (10/11): EF > 55%, mild LVH, grade I diastolic dysfunction, nomral RV size and systolic function,. normal valves. Carotid US (10/11): minimal disease    Past  Surgical History:  Procedure Laterality Date  . ABDOMINAL HERNIA REPAIR  1998,2000  . calcium kidney stones    . [facility], [name], [place]; Dr. [name]. Multiple adenomatous polyps (total 4).   . COLONOSCOPY WITH PROPOFOL N/A 08/30/2015   Procedure: COLONOSCOPY WITH PROPOFOL;  Surgeon: [name], MD;  Location[facility] ENDOSCOPY;  Service: Endoscopy;  Laterality: N/A;  . FEMORAL HERNIA REPAIR  2007  . HEMORRHOID SURGERY    . HERNIA REPAIR  09/10/2010   Repair of recurrent ventral hernia, resection of previously placed mesh x2, implantation 7.5 x 10 cm Gore-Tex dual mesh in an underlay position, repair of epigastric hernia with a 4.2 cm Proceed ventral patch.  Marland Kitchen HERNIA REPAIR   02//28/2007   Laparoscopic right inguinal hernia repair with Surgipro mesh, Ventrilex patch at umbilicus  . HERNIA REPAIR  11/01/1990  .   Small oval Kugel patch placed in preperitoneal space, [name], MD  . HERNIA REPAIR  01/17/1997   Recurrent ventral hernia 15 x 19 Gore-Tex dual mesh placed laparoscopically with multiple lefft--sided ports, [name], MD  . HERNIA REPAIR  01/07/1995    Primary repair of ventral hernia. [name], MD  . intestinal blockage     as  a child  . KIDNEY STONE extraction     unic acid stones  . left heart cath  2008   minimal luminal irregularities EF 55%  . RIGHT/LEFT HEART CATH AND CORONARY ANGIOGRAPHY N/A 01/13/2017   Procedure: Right/Left Heart Cath and Coronary Angiography;  Surgeon: [name], MD;  Location: [facility];  Service: Cardiovascular;  Laterality: N/A;  . treadmill stress test  2003    Family History Family History  Problem Relation Age of Onset  . Hypertension Father   . Cataracts Father   . Diabetes Father   . Heart attack Mother 65  . Coronary artery disease Mother   . Dementia Mother   . Hypertension Sister   . Depression Sister   . Hypertension  Brother   . Colon cancer Brother   . Diabetes Other        1st degree relative  . Colon cancer Maternal Aunt      Social History Social History   Tobacco Use  . Smoking status: Former Smoker    Packs/day: 0.25    Years: 35.00    Pack years: 8.75    Types: Cigarettes    Quit date: 10/22/1995    Years since quitting: 23.5  . Smokeless tobacco: Current User    Types: Chew  . Tobacco comment: rarely smoked cigarettes and cigars.  states he smoked drugs mostly  Substance Use Topics  . Alcohol use: Yes    Alcohol/week: 0.0 standard drinks    Comment: 6 beers daily; used to use cocaine and methamphetamine  . Drug use: No    Types: Marijuana, Methamphetamines    Comment: former smokerx 27 years     Allergies  Allergen Reactions  . Levaquin [Levofloxacin In D5w]     Severe headaches, skin tingling/redness  . Penicillins Hives and Rash    Has patient had a PCN reaction causing immediate rash, facial/tongue/throat swelling, SOB or lightheadedness with hypotension: Yes Has patient had a PCN reaction causing severe rash involving mucus membranes or skin necrosis: Unknown Has patient had a PCN reaction that required hospitalization: No Has patient had a PCN reaction occurring within the last 10 years: No - Childhood reaction If all of the above answers are "NO", then may proceed with Cephalosporin use.     Current Outpatient Medications  Medication Sig Dispense Refill  . albuterol (VENTOLIN HFA) 108 (90 Base) MCG/ACT inhaler Inhale 2 puffs into the lungs every 4 (four) hours as needed for wheezing or shortness of breath. 1 Inhaler 10  . allopurinol (ZYLOPRIM) 300 MG tablet Take 1 tablet (300 mg total) by mouth daily. 90 tablet 1  . Cholecalciferol (VITAMIN D3) 1.25 MG (50000 UT) CAPS TAKE 1 CAPSULE BY MOUTH ONCE A WEEK. 12 capsule 1  . citalopram (CELEXA) 20 MG tablet Take 1 tablet (20 mg total) by mouth daily. 90 tablet 1  . fluticasone (FLOVENT HFA) 220 MCG/ACT inhaler Inhale 2  puffs into the lungs 2 (two) times daily. 1 Inhaler 5  . lisinopril (PRINIVIL,ZESTRIL) 20 MG tablet TAKE ONE (1) TABLET EACH DAY 90 tablet 1  . montelukast (SINGULAIR) 10 MG tablet Take 1 tablet (10 mg total) by mouth daily. 90 tablet 1  . Multiple Vitamin (MULTIVITAMIN WITH MINERALS) TABS tablet Take 1 tablet by mouth daily. 30 tablet 2  . simvastatin (ZOCOR) 20 MG tablet TAKE 1 TABLET BY MOUTH DAILY 90 tablet 3  . STIOLTO RESPIMAT 2.5-2.5 MCG/ACT AERS Use two inhalations every day 4 g 5  .  testosterone cypionate (DEPOTESTOSTERONE CYPIONATE) 200 MG/ML injection Inject 0.5 mLs (100 mg total) into the muscle once a week. 0.5 ml once weekly 10 mL 0   No current facility-administered medications for this visit.       REVIEW OF SYSTEMS (Negative unless checked)  Constitutional: [] Weight loss  [] Fever  [] Chills Cardiac: [] Chest pain   [] Chest pressure   [] Palpitations   [] Shortness of breath when laying flat   [] Shortness of breath at rest   [] Shortness of breath with exertion. Vascular:  [] Pain in legs with walking   [] Pain in legs at rest   [] Pain in legs when laying flat   [] Claudication   [] Pain in feet when walking  [] Pain in feet at rest  [] Pain in feet when laying flat   [] History of DVT   [] Phlebitis   [x] Swelling in legs   [x] Varicose veins   [] Non-healing ulcers Pulmonary:   [] Uses home oxygen   [] Productive cough   [] Hemoptysis   [] Wheeze  [] COPD   [] Asthma Neurologic:  [] Dizziness  [] Blackouts   [] Seizures   [] History of stroke   [] History of TIA  [] Aphasia   [] Temporary blindness   [] Dysphagia   [] Weakness or numbness in arms   [x] Weakness or numbness in legs Musculoskeletal:  [x] Arthritis   [] Joint swelling   [] Joint pain   [] Low back pain Hematologic:  [] Easy bruising  [] Easy bleeding   [] Hypercoagulable state   [] Anemic  [] Hepatitis Gastrointestinal:  [] Blood in stool   [] Vomiting blood  [] Gastroesophageal reflux/heartburn   [] Abdominal pain Genitourinary:  [] Chronic kidney disease    [] Difficult urination  [] Frequent urination  [] Burning with urination   [] Hematuria Skin:  [] Rashes   [] Ulcers   [] Wounds Psychological:  [] History of anxiety   []  History of major depression.    Physical Exam BP 133/79   Pulse (!) 101   Resp 18   Ht 5\' 11"  (1.803 m)   Wt 240 lb (108.9 kg)   BMI 33.47 kg/m  Gen:  WD/WN, NAD Head: [place]/AT, No temporalis wasting.  Ear/Nose/Throat: Hearing grossly intact, nares w/o erythema or drainage, oropharynx w/o Erythema/Exudate Eyes: Conjunctiva clear, sclera non-icteric  Neck: trachea midline.  No JVD.  Pulmonary:  Good air movement, respirations not labored, no use of accessory muscles  Cardiac: RRR, no JVD Vascular:  Vessel Right Left  Radial Palpable Palpable                          DP 1+ 1+  PT 1+ NP   Gastrointestinal:. No masses, surgical incisions, or scars. Musculoskeletal: M/S 5/5 throughout.  Extremities without ischemic changes.  No deformity or atrophy.  Diffuse, prominent varicosities measuring at least 2 to 3 mm in diameter in both lower extremities a little worse on the left than the right.  Capillary refill is slightly sluggish bilaterally.  Mild bilateral lower extremity edema. Neurologic: Sensation somewhat decreased distally in the extremities.  Symmetrical.  Speech is fluent. Motor exam as listed above. Psychiatric: Judgment intact, Mood & affect appropriate for pt's clinical situation. Dermatologic: No rashes or ulcers noted.  No cellulitis or open wounds.    Radiology No results found.  Labs Recent Results (from the past 2160 hour(s))  Uric acid     Status: None   Collection Time: 02/15/19  9:23 AM  Result Value Ref Range   Uric Acid, Serum 4.9 4.0 - 7.8 mg/dL  Comprehensive metabolic panel     Status: Abnormal   Collection Time:  02/15/19  9:23 AM  Result Value Ref Range   Sodium 137 135 - 145 mEq/L   Potassium 4.4 3.5 - 5.1 mEq/L   Chloride 100 96 - 112 mEq/L   CO2 31 19 - 32 mEq/L   Glucose, Bld  109 (H) 70 - 99 mg/dL   BUN 5 (L) 6 - 23 mg/dL   Creatinine, Ser 0.79 0.40 - 1.50 mg/dL   Total Bilirubin 1.3 (H) 0.2 - 1.2 mg/dL   Alkaline Phosphatase 70 39 - 117 U/L   AST 22 0 - 37 U/L   ALT 18 0 - 53 U/L   Total Protein 6.6 6.0 - 8.3 g/dL   Albumin 4.0 3.5 - 5.2 g/dL   Calcium 9.1 8.4 - 10.5 mg/dL   GFR 99.30 >60.00 mL/min  Hemoglobin A1c     Status: Abnormal   Collection Time: 02/15/19  9:23 AM  Result Value Ref Range   Hgb A1c MFr Bld 6.8 (H) 4.6 - 6.5 %    Comment: Glycemic Control Guidelines for People with Diabetes:Non Diabetic:  <6%Goal of Therapy: <7%Additional Action Suggested:  >8%   Lipid panel     Status: None   Collection Time: 02/15/19  9:23 AM  Result Value Ref Range   Cholesterol 136 0 - 200 mg/dL    Comment: ATP III Classification       Desirable:  < 200 mg/dL               Borderline High:  200 - 239 mg/dL          High:  > = 240 mg/dL   Triglycerides 103.0 0.0 - 149.0 mg/dL    Comment: Normal:  <150 mg/dLBorderline High:  150 - 199 mg/dL   HDL 44.50 >39.00 mg/dL   VLDL 20.6 0.0 - 40.0 mg/dL   LDL Cholesterol 71 0 - 99 mg/dL   Total CHOL/HDL Ratio 3     Comment:                Men          Women1/2 Average Risk     3.4          3.3Average Risk          5.0          4.42X Average Risk          9.6          7.13X Average Risk          15.0          11.0                       NonHDL 91.11     Comment: NOTE:  Non-HDL goal should be 30 mg/dL higher than patient's LDL goal (i.e. LDL goal of < 70 mg/dL, would have non-HDL goal of < 100 mg/dL)  Testosterone     Status: None   Collection Time: 03/16/19  3:00 PM  Result Value Ref Range   Testosterone 885 264 - 916 ng/dL    Comment: Adult male reference interval is based on a population of healthy nonobese males (BMI <30) between 17 and 1 years old. [place], [place] [phone]. PMID: [id number].   PSA     Status: None   Collection Time: 03/16/19  3:00 PM  Result Value Ref Range   Prostate Specific Ag,  Serum 1.5 0.0 - 4.0 ng/mL    Comment: Roche ECLIA methodology. According to  the American Urological Association, Serum PSA should decrease and remain at undetectable levels after radical prostatectomy. The AUA defines biochemical recurrence as an initial PSA value 0.2 ng/mL or greater followed by a subsequent confirmatory PSA value 0.2 ng/mL or greater. Values obtained with different assay methods or kits cannot be used interchangeably. Results cannot be interpreted as absolute evidence of the presence or absence of malignant disease.   PSA     Status: None   Collection Time: 04/29/19  2:05 PM  Result Value Ref Range   Prostate Specific Ag, Serum 1.8 0.0 - 4.0 ng/mL    Comment: Roche ECLIA methodology. According to the American Urological Association, Serum PSA should decrease and remain at undetectable levels after radical prostatectomy. The AUA defines biochemical recurrence as an initial PSA value 0.2 ng/mL or greater followed by a subsequent confirmatory PSA value 0.2 ng/mL or greater. Values obtained with different assay methods or kits cannot be used interchangeably. Results cannot be interpreted as absolute evidence of the presence or absence of malignant disease.   Testosterone     Status: Abnormal   Collection Time: 04/29/19  2:05 PM  Result Value Ref Range   Testosterone >1500 (H) 264 - 916 ng/dL    Comment: Adult male reference interval is based on a population of healthy nonobese males (BMI <30) between 89 and 27 years old. [place], [place] [phone]. PMID: [id number].     Assessment/Plan:  Essential hypertension, benign blood pressure control important in reducing the progression of atherosclerotic disease. On appropriate oral medications.   Hyperlipidemia LDL goal <70 lipid control important in reducing the progression of atherosclerotic disease. Continue statin therapy   Alcohol abuse, in remission Could be a risk factor for the development of  neuropathy  Varicose veins of leg with pain, bilateral The patient has prominent varicosities bilaterally that may be contributing to his lower extremity symptoms.  I discussed the pathophysiology and natural history of venous disease.  Compression stockings, elevation, and anti-inflammatories as needed for the discomfort will be recommended.  Venous reflux study will be done in the near future at his convenience.  I will see the patient back following the study to discuss the results and determine further treatment options  Pain in limb The patient has lower extremity pain that is not entirely clear in its etiology.  He clearly has a neuropathic component by the description of the pain, but this could be driven by arterial insufficiency, previous alcohol use, or elevated sugars with a hemoglobin A1c of 8.  He has not had an arterial assessment I would certainly recommend assessment for PAD with noninvasive studies in the near future at his convenience.  I discussed the pathophysiology and natural history of peripheral arterial disease.  We will see him back following the studies to discuss the results and determine further treatment options      Leotis Pain 05/04/2019, 11:45 AM   This note was created with Dragon medical transcription system.  Any errors from dictation are unintentional.

## 2019-05-05 NOTE — Telephone Encounter (Signed)
Called pt informed him of the information below. Pt gave verbal understanding. Lab orders placed. Appt scheduled for lab drop off as well as Provider appt to discuss prostate management.

## 2019-05-12 ENCOUNTER — Other Ambulatory Visit: Payer: Self-pay

## 2019-05-12 ENCOUNTER — Other Ambulatory Visit: Payer: Medicare Other

## 2019-05-12 DIAGNOSIS — E291 Testicular hypofunction: Secondary | ICD-10-CM

## 2019-05-13 LAB — TESTOSTERONE: Testosterone: 557 ng/dL (ref 264–916)

## 2019-05-13 LAB — HEMATOCRIT: Hematocrit: 44.6 % (ref 37.5–51.0)

## 2019-05-14 ENCOUNTER — Telehealth: Payer: Self-pay | Admitting: Urology

## 2019-05-14 NOTE — Telephone Encounter (Signed)
Pt. Called and stated he had labs done 2 weeks ago and would like for someone to call him with results if they are completed.

## 2019-05-17 MED ORDER — TESTOSTERONE CYPIONATE 200 MG/ML IM SOLN: 60.0000 mg | INTRAMUSCULAR | 0 refills | Status: DC

## 2019-05-17 NOTE — Telephone Encounter (Signed)
Patient informed-he is questioning why he still has to decrease his testosterone even more if his levels decreased from previously.

## 2019-05-17 NOTE — Telephone Encounter (Signed)
Pt called and would like a call back about his lab results. Please advise.

## 2019-05-17 NOTE — Telephone Encounter (Signed)
Repeat labs were done on 7/22, not 2 weeks ago.  Testosterone level was 556.  Recommend decreasing his testosterone dose to 0.3 cc weekly.  Hematocrit level was normal.

## 2019-05-18 NOTE — Telephone Encounter (Signed)
Because his PSA is abnormally elevated above baseline.  He has an appointment on 8/13 to discuss prostate biopsy.  His testosterone may need to be discontinued until he has a prostate biopsy.  Will discuss further at that appointment.

## 2019-05-18 NOTE — Telephone Encounter (Signed)
Informed patient, verbalized understanding.

## 2019-05-24 ENCOUNTER — Encounter (INDEPENDENT_AMBULATORY_CARE_PROVIDER_SITE_OTHER): Payer: Self-pay | Admitting: Nurse Practitioner

## 2019-05-24 ENCOUNTER — Ambulatory Visit (INDEPENDENT_AMBULATORY_CARE_PROVIDER_SITE_OTHER): Payer: Medicare Other

## 2019-05-24 ENCOUNTER — Ambulatory Visit (INDEPENDENT_AMBULATORY_CARE_PROVIDER_SITE_OTHER): Payer: Medicare Other | Admitting: Nurse Practitioner

## 2019-05-24 ENCOUNTER — Other Ambulatory Visit: Payer: Self-pay

## 2019-05-24 ENCOUNTER — Ambulatory Visit: Payer: Medicare Other | Admitting: Neurology

## 2019-05-24 VITALS — BP 137/89 | HR 88 | Resp 18 | Ht 71.0 in | Wt 243.0 lb

## 2019-05-24 DIAGNOSIS — J441 Chronic obstructive pulmonary disease with (acute) exacerbation: Secondary | ICD-10-CM

## 2019-05-24 DIAGNOSIS — M79605 Pain in left leg: Secondary | ICD-10-CM

## 2019-05-24 DIAGNOSIS — I83813 Varicose veins of bilateral lower extremities with pain: Secondary | ICD-10-CM

## 2019-05-24 DIAGNOSIS — I10 Essential (primary) hypertension: Secondary | ICD-10-CM

## 2019-05-24 DIAGNOSIS — M79604 Pain in right leg: Secondary | ICD-10-CM | POA: Diagnosis not present

## 2019-05-24 NOTE — Progress Notes (Signed)
SUBJECTIVE:  Patient ID: Cody Day, male    DOB: November 16, 1955, 63 y.o.   MRN: [id number] Chief Complaint  Patient presents with  . Follow-up    ultrasound follow up    HPI  Cody Day is a 63 y.o. male Patient is seen for evaluation of leg pain and leg swelling. The patient first noticed the swelling remotely. The swelling is associated with pain and discoloration. The pain and swelling worsens with prolonged dependency and improves with elevation. The pain is unrelated to activity.  The patient notes that in the morning the legs are significantly improved but they steadily worsened throughout the course of the day. The patient also notes a steady worsening of the discoloration in the ankle and shin area.   The patient does endorse having some numbness and tingling in the bilateral lower extremities as well.  The patient denies claudication symptoms.  The patient denies symptoms consistent with rest pain.  The patient denies and extensive history of DJD and LS spine disease.  The patient has no had any past angiography, interventions or vascular surgery.  Elevation makes the leg symptoms better, dependency makes them much worse. There is no history of ulcerations. The patient denies any recent changes in medications.  The patient has not been wearing graduated compression.  The patient denies a history of DVT or PE. There is no prior history of phlebitis. There is no history of primary lymphedema.  No history of malignancies. No history of trauma or groin or pelvic surgery. There is no history of radiation treatment to the groin or pelvis  The patient denies amaurosis fugax or recent TIA symptoms. There are no recent neurological changes noted. The patient denies recent episodes of angina or shortness of breath  Today the patient underwent bilateral ABIs which revealed an ABI of 1.23 on his right and 1.17 on his left.  He has strong triphasic waveforms within the bilateral tibial  arteries.  The patient also underwent a venous reflux study which revealed reflux within the bilateral great saphenous veins.  There is no evidence of DVT. Past Medical History:  Diagnosis Date  . Achilles tendon rupture 10/11   and repair  . Adenomatous polyp of colon 04/06/2010   Adenomatous polyps 3 in the descending colon, times one in the ascending colon [facility])  . Asthma   . Gout   . Hemorrhoids   . Hypercholesteremia   . Hypertension   . Sleep apnea    doesn't wear CPAP  . Syncope 10/11   in settin gof asthma exacerbation w coughing. Ech (10/11): EF > 55%, mild LVH, grade I diastolic dysfunction, nomral RV size and systolic function,. normal valves. Carotid US (10/11): minimal disease    Past Surgical History:  Procedure Laterality Date  . ABDOMINAL HERNIA REPAIR  1998,2000  . calcium kidney stones    . [facility], [name], [place]; Dr. [name]. Multiple adenomatous polyps (total 4).   . COLONOSCOPY WITH PROPOFOL N/A 08/30/2015   Procedure: COLONOSCOPY WITH PROPOFOL;  Surgeon: [name], MD;  Location: [facility] END[facility]COPY;  Service: Endoscopy;  Laterality: N/A;  . FEMORAL HERNIA REPAIR  2007  . HEMORRHOID SURGERY    . HERNIA REPAIR  09/10/2010   Repair of recurrent ventral hernia, resection of previously placed mesh x2, implantation 7.5 x 10 cm Gore-Tex dual mesh in an underlay position, repair of epigastric hernia  with a 4.2 cm Proceed ventral patch.  Marland Kitchen HERNIA REPAIR   02//28/2007   Laparoscopic right inguinal hernia repair with Surgipro mesh, Ventrilex patch at umbilicus  . HERNIA REPAIR  11/01/1990  .   Small oval Kugel patch placed in preperitoneal space, [name], MD  . HERNIA REPAIR  01/17/1997   Recurrent ventral hernia 15 x 19 Gore-Tex dual mesh placed laparoscopically with multiple lefft--sided ports, [name], MD  . HERNIA REPAIR  01/07/1995    Primary repair of ventral  hernia. [name], MD  . intestinal blockage     as a child  . KIDNEY STONE extraction     unic acid stones  . left heart cath  2008   minimal luminal irregularities EF 55%  . RIGHT/LEFT HEART CATH AND CORONARY ANGIOGRAPHY N/A 01/13/2017   Procedure: Right/Left Heart Cath and Coronary Angiography;  Surgeon: [name], MD;  Location: [facility];  Service: Cardiovascular;  Laterality: N/A;  . treadmill stress test  2003    Social History   Socioeconomic History  . Marital status: Married    Spouse name: Not on file  . Number of children: Y  . Years of education: Not on file  . Highest education level: Not on file  Occupational History  . Occupation: Corporate treasurer: [employer]  Social Needs  . Financial resource strain: Not on file  . Food insecurity    Worry: Not on file    Inability: Not on file  . Transportation needs    Medical: Not on file    Non-medical: Not on file  Tobacco Use  . Smoking status: Former Smoker    Packs/day: 0.25    Years: 35.00    Pack years: 8.75    Types: Cigarettes    Quit date: 10/22/1995    Years since quitting: 23.6  . Smokeless tobacco: Current User    Types: Chew  . Tobacco comment: rarely smoked cigarettes and cigars.  states he smoked drugs mostly  Substance and Sexual Activity  . Alcohol use: Yes    Alcohol/week: 0.0 standard drinks    Comment: 6 beers daily; used to use cocaine and methamphetamine  . Drug use: No    Types: Marijuana, Methamphetamines    Comment: former smokerx 27 years  . Sexual activity: Yes    Birth control/protection: None  Lifestyle  . Physical activity    Days per week: Not on file    Minutes per session: Not on file  . Stress: Not on file  Relationships  . Social Herbalist on phone: Not on file    Gets together: Not on file    Attends religious service: Not on file    Active member of club or organization: Not on file    Attends meetings of clubs  or organizations: Not on file    Relationship status: Not on file  . Intimate partner violence    Fear of current or ex partner: Not on file    Emotionally abused: Not on file    Physically abused: Not on file    Forced sexual activity: Not on file  Other Topics Concern  . Not on file  Social History Narrative   Poor diet, lots of fats. Does not regularly exercise. Married. Originally from [place].     Family History  Problem Relation Age of Onset  . Hypertension Father   . Cataracts Father   . Diabetes Father   .  Heart attack Mother 62  . Coronary artery disease Mother   . Dementia Mother   . Hypertension Sister   . Depression Sister   . Hypertension Brother   . Colon cancer Brother   . Diabetes Other        1st degree relative  . Colon cancer Maternal Aunt     Allergies  Allergen Reactions  . Levaquin [Levofloxacin In D5w]     Severe headaches, skin tingling/redness  . Penicillins Hives and Rash    Has patient had a PCN reaction causing immediate rash, facial/tongue/throat swelling, SOB or lightheadedness with hypotension: Yes Has patient had a PCN reaction causing severe rash involving mucus membranes or skin necrosis: Unknown Has patient had a PCN reaction that required hospitalization: No Has patient had a PCN reaction occurring within the last 10 years: No - Childhood reaction If all of the above answers are "NO", then may proceed with Cephalosporin use.      Review of Systems   Review of Systems: Negative Unless Checked Constitutional: [] Weight loss  [] Fever  [] Chills Cardiac: [] Chest pain   []  Atrial Fibrillation  [] Palpitations   [] Shortness of breath when laying flat   [] Shortness of breath with exertion. [] Shortness of breath at rest Vascular:  [] Pain in legs with walking   [] Pain in legs with standing [] Pain in legs when laying flat   [] Claudication    [] Pain in feet when laying flat    [] History of DVT   [] Phlebitis   [] Swelling in legs   [] Varicose  veins   [] Non-healing ulcers Pulmonary:   [] Uses home oxygen   [] Productive cough   [] Hemoptysis   [] Wheeze  [] COPD   [] Asthma Neurologic:  [] Dizziness   [] Seizures  [] Blackouts [] History of stroke   [] History of TIA  [] Aphasia   [] Temporary Blindness   [] Weakness or numbness in arm   [] Weakness or numbness in leg Musculoskeletal:   [] Joint swelling   [] Joint pain   [] Low back pain  []  History of Knee Replacement [] Arthritis [] back Surgeries  []  Spinal Stenosis    Hematologic:  [] Easy bruising  [] Easy bleeding   [] Hypercoagulable state   [] Anemic Gastrointestinal:  [] Diarrhea   [] Vomiting  [] Gastroesophageal reflux/heartburn   [] Difficulty swallowing. [] Abdominal pain Genitourinary:  [] Chronic kidney disease   [] Difficult urination  [] Anuric   [] Blood in urine [] Frequent urination  [] Burning with urination   [] Hematuria Skin:  [] Rashes   [] Ulcers [] Wounds Psychological:  [] History of anxiety   []  History of major depression  []  Memory Difficulties      OBJECTIVE:   Physical Exam  BP 137/89 (BP Location: Right Arm)   Pulse 88   Resp 18   Ht 5\' 11"  (1.803 m)   Wt 243 lb (110.2 kg)   BMI 33.89 kg/m   Gen: WD/WN, NAD Head: [place]/AT, No temporalis wasting.  Ear/Nose/Throat: Hearing grossly intact, nares w/o erythema or drainage Eyes: PER, EOMI, sclera nonicteric.  Neck: Supple, no masses.  No JVD.  Pulmonary:  Good air movement, no use of accessory muscles.  Cardiac: RRR Vascular:  scattered varicosities present bilaterally.  Mild venous stasis changes to the legs bilaterally.  2+ soft pitting edema  Vessel Right Left  Radial Palpable Palpable  Brachial Palpable Palpable  Femoral Palpable Palpable  Popliteal Palpable Palpable  Dorsalis Pedis Palpable Palpable  Posterior Tibial Palpable Palpable   Gastrointestinal: soft, non-distended. No guarding/no peritoneal signs.  Musculoskeletal: M/S 5/5 throughout.  No deformity or atrophy.  Neurologic: Pain and light touch intact  in  extremities.  Symmetrical.  Speech is fluent. Motor exam as listed above. Psychiatric: Judgment intact, Mood & affect appropriate for pt's clinical situation. Dermatologic: No Venous rashes. No Ulcers Noted.  No changes consistent with cellulitis. Lymph : No Cervical lymphadenopathy, no lichenification or skin changes of chronic lymphedema.       ASSESSMENT AND PLAN:  1. Varicose veins of leg with pain, bilateral  Recommend:  The patient has large symptomatic varicose veins that are painful and associated with swelling.  I have had a long discussion with the patient regarding  varicose veins and why they cause symptoms.  Patient will begin wearing graduated compression stockings class 1 on a daily basis, beginning first thing in the morning and removing them in the evening. The patient is instructed specifically not to sleep in the stockings.    The patient  will also begin using over-the-counter analgesics such as Motrin 600 mg po TID to help control the symptoms.    In addition, behavioral modification including elevation during the day will be initiated.    Further plans will be based on the ultrasound results and whether conservative therapies are successful at eliminating the pain and swelling.   2. Essential hypertension, benign Continue antihypertensive medications as already ordered, these medications have been reviewed and there are no changes at this time.   3. COPD exacerbation ([place]) Continue pulmonary medications and aerosols as already ordered, these medications have been reviewed and there are no changes at this time.    4. Pain in both lower extremities Recommend:  Some of the patient's pain can certainly be attributed to the varicose veins in his lower extremities, as there are extensive.  However the numbness and tingling that he feels may be more related to neuropathy or possibly even related to longstanding alcohol abuse.  Patient is recommended for further  follow-up with primary care physician.   Current Outpatient Medications on File Prior to Visit  Medication Sig Dispense Refill  . albuterol (VENTOLIN HFA) 108 (90 Base) MCG/ACT inhaler Inhale 2 puffs into the lungs every 4 (four) hours as needed for wheezing or shortness of breath. 1 Inhaler 10  . allopurinol (ZYLOPRIM) 300 MG tablet Take 1 tablet (300 mg total) by mouth daily. 90 tablet 1  . Cholecalciferol (VITAMIN D3) 1.25 MG (50000 UT) CAPS TAKE 1 CAPSULE BY MOUTH ONCE A WEEK. 12 capsule 1  . fluticasone (FLOVENT HFA) 220 MCG/ACT inhaler Inhale 2 puffs into the lungs 2 (two) times daily. 1 Inhaler 5  . lisinopril (PRINIVIL,ZESTRIL) 20 MG tablet TAKE ONE (1) TABLET EACH DAY 90 tablet 1  . montelukast (SINGULAIR) 10 MG tablet Take 1 tablet (10 mg total) by mouth daily. 90 tablet 1  . Multiple Vitamin (MULTIVITAMIN WITH MINERALS) TABS tablet Take 1 tablet by mouth daily. 30 tablet 2  . simvastatin (ZOCOR) 20 MG tablet TAKE 1 TABLET BY MOUTH DAILY 90 tablet 3  . STIOLTO RESPIMAT 2.5-2.5 MCG/ACT AERS Use two inhalations every day 4 g 5  . testosterone cypionate (DEPOTESTOSTERONE CYPIONATE) 200 MG/ML injection Inject 0.3 mLs (60 mg total) into the muscle once a week. 0.5 ml once weekly 10 mL 0  . citalopram (CELEXA) 20 MG tablet Take 1 tablet (20 mg total) by mouth daily. 90 tablet 1   No current facility-administered medications on file prior to visit.     There are no Patient Instructions on file for this visit. No follow-ups on file.   Kris Hartmann, NP  This note  was completed with Sales executive.  Any errors are purely unintentional.

## 2019-05-25 ENCOUNTER — Encounter: Payer: Self-pay | Admitting: General Surgery

## 2019-06-03 ENCOUNTER — Other Ambulatory Visit: Payer: Self-pay

## 2019-06-03 ENCOUNTER — Ambulatory Visit (INDEPENDENT_AMBULATORY_CARE_PROVIDER_SITE_OTHER): Payer: Medicare Other | Admitting: Urology

## 2019-06-03 ENCOUNTER — Encounter: Payer: Self-pay | Admitting: Urology

## 2019-06-03 VITALS — BP 132/81 | HR 102 | Ht 71.0 in | Wt 243.0 lb

## 2019-06-03 DIAGNOSIS — R972 Elevated prostate specific antigen [PSA]: Secondary | ICD-10-CM | POA: Diagnosis not present

## 2019-06-03 DIAGNOSIS — E291 Testicular hypofunction: Secondary | ICD-10-CM | POA: Insufficient documentation

## 2019-06-03 NOTE — Progress Notes (Signed)
06/03/2019 8:29 AM   Cody Day [birth year] [id number]  Referring provider: [name], MD [address] [place],  [place] [address]  Chief Complaint  Patient presents with  . Results    HPI: 64 y.o. male with hypogonadism on TRT.  Since 2014 his baseline PSA has been in the 0.4-0.6 range.  In June 2020 PSA had increased to 1.5.  A repeat PSA in July was 1.8.  Testosterone level was >1500 and after holding for 1 week a follow-up testosterone level was 557.  Testosterone dose was reduced.  He denies bothersome lower urinary tract symptoms.  PMH: Past Medical History:  Diagnosis Date  . Achilles tendon rupture 10/11   and repair  . Adenomatous polyp of colon 04/06/2010   Adenomatous polyps 3 in the descending colon, times one in the ascending colon [facility])  . Asthma   . Gout   . Hemorrhoids   . Hypercholesteremia   . Hypertension   . Sleep apnea    doesn't wear CPAP  . Syncope 10/11   in settin gof asthma exacerbation w coughing. Ech (10/11): EF > 55%, mild LVH, grade I diastolic dysfunction, nomral RV size and systolic function,. normal valves. Carotid US (10/11): minimal disease    Surgical History: Past Surgical History:  Procedure Laterality Date  . ABDOMINAL HERNIA REPAIR  1998,2000  . calcium kidney stones    . [facility], [name], [place]; Dr. [name]. Multiple adenomatous polyps (total 4).   . COLONOSCOPY WITH PROPOFOL N/A 08/30/2015   Procedure: COLONOSCOPY WITH PROPOFOL;  Surgeon: [name], MD;  Location: [facility] ENDOSCOPY;  Service: Endoscopy;  Laterality: N/A;  . FEMORAL HERNIA REPAIR  2007  . HEMORRHOID SURGERY    . HERNIA REPAIR  09/10/2010   Repair of recurrent ventral hernia, resection of previously placed mesh x2, implantation 7.5 x 10 cm Gore-Tex dual mesh in an underlay position, repair of epigastric hernia with a 4.2 cm Proceed ventral patch.   Marland Kitchen HERNIA REPAIR   02//28/2007   Laparoscopic right inguinal hernia repair with Surgipro mesh, Ventrilex patch at umbilicus  . HERNIA REPAIR  11/01/1990  .   Small oval Kugel patch placed in preperitoneal space, [name], MD  . HERNIA REPAIR  01/17/1997   Recurrent ventral hernia 15 x 19 Gore-Tex dual mesh placed laparoscopically with multiple lefft--sided ports, [name], MD  . HERNIA REPAIR  01/07/1995    Primary repair of ventral hernia. [name], MD  . intestinal blockage     as a child  . KIDNEY STONE extraction     unic acid stones  . left heart cath  2008   minimal luminal irregularities EF 55%  . RIGHT/LEFT HEART CATH AND CORONARY ANGIOGRAPHY N/A 01/13/2017   Procedure: Right/Left Heart Cath and Coronary Angiography;  Surgeon: [name], MD;  Location: [facility];  Service: Cardiovascular;  Laterality: N/A;  . treadmill stress test  2003    Home Medications:  Allergies as of 06/03/2019      Reactions   Levaquin [levofloxacin In D5w]    Severe headaches, skin tingling/redness   Penicillins Hives, Rash   Has patient had a PCN reaction causing immediate rash, facial/tongue/throat swelling, SOB or lightheadedness with hypotension: Yes Has patient had a PCN reaction causing severe rash involving mucus membranes or skin necrosis: Unknown Has patient had a PCN reaction that  required hospitalization: No Has patient had a PCN reaction occurring within the last 10 years: No - Childhood reaction If all of the above answers are "NO", then may proceed with Cephalosporin use.      Medication List       Accurate as of June 03, 2019  8:29 AM. If you have any questions, ask your nurse or doctor.        albuterol 108 (90 Base) MCG/ACT inhaler Commonly known as: Ventolin HFA Inhale 2 puffs into the lungs every 4 (four) hours as needed for wheezing or shortness of breath.   allopurinol 300 MG tablet Commonly known as: ZYLOPRIM Take 1 tablet (300 mg total) by mouth  daily.   citalopram 20 MG tablet Commonly known as: CeleXA Take 1 tablet (20 mg total) by mouth daily.   fluticasone 220 MCG/ACT inhaler Commonly known as: FLOVENT HFA Inhale 2 puffs into the lungs 2 (two) times daily.   lisinopril 20 MG tablet Commonly known as: ZESTRIL TAKE ONE (1) TABLET EACH DAY   montelukast 10 MG tablet Commonly known as: SINGULAIR Take 1 tablet (10 mg total) by mouth daily.   multivitamin with minerals Tabs tablet Take 1 tablet by mouth daily.   simvastatin 20 MG tablet Commonly known as: ZOCOR TAKE 1 TABLET BY MOUTH DAILY   Stiolto Respimat 2.5-2.5 MCG/ACT Aers Generic drug: Tiotropium Bromide-Olodaterol Use two inhalations every day   testosterone cypionate 200 MG/ML injection Commonly known as: DEPOTESTOSTERONE CYPIONATE Inject 0.3 mLs (60 mg total) into the muscle once a week. 0.5 ml once weekly   Vitamin D3 1.25 MG (50000 UT) Caps TAKE 1 CAPSULE BY MOUTH ONCE A WEEK.       Allergies:  Allergies  Allergen Reactions  . Levaquin [Levofloxacin In D5w]     Severe headaches, skin tingling/redness  . Penicillins Hives and Rash    Has patient had a PCN reaction causing immediate rash, facial/tongue/throat swelling, SOB or lightheadedness with hypotension: Yes Has patient had a PCN reaction causing severe rash involving mucus membranes or skin necrosis: Unknown Has patient had a PCN reaction that required hospitalization: No Has patient had a PCN reaction occurring within the last 10 years: No - Childhood reaction If all of the above answers are "NO", then may proceed with Cephalosporin use.     Family History: Family History  Problem Relation Age of Onset  . Hypertension Father   . Cataracts Father   . Diabetes Father   . Heart attack Mother 39  . Coronary artery disease Mother   . Dementia Mother   . Hypertension Sister   . Depression Sister   . Hypertension Brother   . Colon cancer Brother   . Diabetes Other        1st degree  relative  . Colon cancer Maternal Aunt     Social History:  reports that he quit smoking about 23 years ago. His smoking use included cigarettes. He has a 8.75 pack-year smoking history. His smokeless tobacco use includes chew. He reports current alcohol use. He reports that he does not use drugs.  ROS: UROLOGY Frequent Urination?: No Hard to postpone urination?: No Burning/pain with urination?: No Get up at night to urinate?: No Leakage of urine?: No Urine stream starts and stops?: No Trouble starting stream?: No Do you have to strain to urinate?: No Blood in urine?: No Urinary tract infection?: No Sexually transmitted disease?: No Injury to kidneys or bladder?: No Painful intercourse?: No Weak stream?: No Erection problems?:  No Penile pain?: No  Gastrointestinal Nausea?: No Vomiting?: No Indigestion/heartburn?: No Diarrhea?: No  Constitutional Fever: No Night sweats?: No Weight loss?: No Fatigue?: No  Skin Skin rash/lesions?: No Itching?: No  Eyes Blurred vision?: No Double vision?: No  Ears/Nose/Throat Sore throat?: No Sinus problems?: No  Hematologic/Lymphatic Swollen glands?: No Easy bruising?: No  Cardiovascular Leg swelling?: No Chest pain?: No  Respiratory Cough?: No Shortness of breath?: No  Endocrine Excessive thirst?: No  Musculoskeletal Back pain?: No Joint pain?: No  Neurological Headaches?: No Dizziness?: No  Psychologic Depression?: No Anxiety?: No  Physical Exam: BP 132/81 (BP Location: Left Arm, Patient Position: Sitting, Cuff Size: Large)   Pulse (!) 102   Ht 5\' 11"  (1.803 m)   Wt 243 lb (110.2 kg)   BMI 33.89 kg/m   Constitutional:  Alert and oriented, No acute distress. HEENT: [place] AT, moist mucus membranes.  Trachea midline, no masses. Cardiovascular: No clubbing, cyanosis, or edema. Respiratory: Normal respiratory effort, no increased work of breathing. GU: No CVA tenderness.  Prostate 45 g, smooth without  nodules. Skin: No rashes, bruises or suspicious lesions. Neurologic: Grossly intact, no focal deficits, moving all 4 extremities. Psychiatric: Normal mood and affect.   Assessment & Plan:    - Abnormal PSA velocity on TRT I discussed potential etiologies with Mr. Mauney including prostate cancer, stimulation of benign prostate growth on TRT and prostatic inflammation.  I initially recommended a prostate MRI.  If no suspicious lesions are noted on MRI and he desires to stay on TRT will need a standard prostate biopsy.   Abbie Sons, [place] [address] [place], [place] [address] [phone]

## 2019-06-17 ENCOUNTER — Telehealth: Payer: Self-pay

## 2019-06-17 NOTE — Telephone Encounter (Signed)
Pt says he has COPD and Asthma and 1 - 2 times a year he gets exacerbations/flare ups that require a zpak and prednisone. For the last 2 weeks he has been cough stuff up. Asking if this can be called in or does he need to do a virtual visit.  Pharmacy is [facility].  Please advise at [phone]  Sending to Dr Lorelei Pont in Dr Rometta Emery absence today.

## 2019-06-17 NOTE — Telephone Encounter (Signed)
It is not my practice pattern to call in steroids and antibiotics over the phone, and recommend video visit today

## 2019-06-17 NOTE — Telephone Encounter (Signed)
Spoke to pt's wife per DPR. She asked to do a VV with Dr Diona Browner tomorrow. There is a 340pm appt but there are already 7 appointments in the afternoon. Will need to seek approval from [facility] or Dr Diona Browner before scheduling that.

## 2019-06-17 NOTE — Telephone Encounter (Signed)
Spoke with Cody Day.  Doxy appointment scheduled tomorrow with Dr. Diona Browner at 9:20 am.

## 2019-06-18 ENCOUNTER — Ambulatory Visit (INDEPENDENT_AMBULATORY_CARE_PROVIDER_SITE_OTHER): Payer: Medicare Other | Admitting: Family Medicine

## 2019-06-18 ENCOUNTER — Encounter: Payer: Self-pay | Admitting: Family Medicine

## 2019-06-18 DIAGNOSIS — J441 Chronic obstructive pulmonary disease with (acute) exacerbation: Secondary | ICD-10-CM | POA: Diagnosis not present

## 2019-06-18 MED ORDER — PREDNISONE 20 MG PO TABS: ORAL_TABLET | ORAL | 0 refills | Status: DC

## 2019-06-18 MED ORDER — AZITHROMYCIN 250 MG PO TABS: ORAL_TABLET | ORAL | 0 refills | Status: DC

## 2019-06-18 NOTE — Addendum Note (Signed)
Addended by: Lurlean Nanny on: 06/18/2019 02:49 PM   Modules accepted: Orders

## 2019-06-18 NOTE — Patient Instructions (Signed)
If severe SOB, go to ER.    If not having testing... given congestion and cough.. home isolate as if it is diagnosed COVID.     Person Under Monitoring Name: Cody Day  Location: [id number] [place] [address]   Infection Prevention Recommendations for Individuals Confirmed to have, or Being Evaluated for, 2019 Novel Coronavirus (COVID-19) Infection Who Receive Care at Home  Individuals who are confirmed to have, or are being evaluated for, COVID-19 should follow the prevention steps below until a healthcare provider or local or state health department says they can return to normal activities.  Stay home except to get medical care You should restrict activities outside your home, except for getting medical care. Do not go to work, school, or public areas, and do not use public transportation or taxis.  Call ahead before visiting your doctor Before your medical appointment, call the healthcare provider and tell them that you have, or are being evaluated for, COVID-19 infection. This will help the healthcare provider's office take steps to keep other people from getting infected. Ask your healthcare provider to call the local or state health department.  Monitor your symptoms Seek prompt medical attention if your illness is worsening (e.g., difficulty breathing). Before going to your medical appointment, call the healthcare provider and tell them that you have, or are being evaluated for, COVID-19 infection. Ask your healthcare provider to call the local or state health department.  Wear a facemask You should wear a facemask that covers your nose and mouth when you are in the same room with other people and when you visit a healthcare provider. People who live with or visit you should also wear a facemask while they are in the same room with you.  Separate yourself from other people in your home As much as possible, you should stay in a different room from  other people in your home. Also, you should use a separate bathroom, if available.  Avoid sharing household items You should not share dishes, drinking glasses, cups, eating utensils, towels, bedding, or other items with other people in your home. After using these items, you should wash them thoroughly with soap and water.  Cover your coughs and sneezes Cover your mouth and nose with a tissue when you cough or sneeze, or you can cough or sneeze into your sleeve. Throw used tissues in a lined trash can, and immediately wash your hands with soap and water for at least 20 seconds or use an alcohol-based hand rub.  Wash your [facility] your hands often and thoroughly with soap and water for at least 20 seconds. You can use an alcohol-based hand sanitizer if soap and water are not available and if your hands are not visibly dirty. Avoid touching your eyes, nose, and mouth with unwashed hands.   Prevention Steps for Caregivers and Household Members of Individuals Confirmed to have, or Being Evaluated for, COVID-19 Infection Being Cared for in the Home  If you live with, or provide care at home for, a person confirmed to have, or being evaluated for, COVID-19 infection please follow these guidelines to prevent infection:  Follow healthcare provider's instructions Make sure that you understand and can help the patient follow any healthcare provider instructions for all care.  Provide for the patient's basic needs You should help the patient with basic needs in the home and provide support for getting groceries, prescriptions, and other personal needs.  Monitor the patient's symptoms If they  are getting sicker, call his or her medical provider and tell them that the patient has, or is being evaluated for, COVID-19 infection. This will help the healthcare provider's office take steps to keep other people from getting infected. Ask the healthcare provider to call the local or state health  department.  Limit the number of people who have contact with the patient  If possible, have only one caregiver for the patient.  Other household members should stay in another home or place of residence. If this is not possible, they should stay  in another room, or be separated from the patient as much as possible. Use a separate bathroom, if available.  Restrict visitors who do not have an essential need to be in the home.  Keep older adults, very young children, and other sick people away from the patient Keep older adults, very young children, and those who have compromised immune systems or chronic health conditions away from the patient. This includes people with chronic heart, lung, or kidney conditions, diabetes, and cancer.  Ensure good ventilation Make sure that shared spaces in the home have good air flow, such as from an air conditioner or an opened window, weather permitting.  Wash your hands often  Wash your hands often and thoroughly with soap and water for at least 20 seconds. You can use an alcohol based hand sanitizer if soap and water are not available and if your hands are not visibly dirty.  Avoid touching your eyes, nose, and mouth with unwashed hands.  Use disposable paper towels to dry your hands. If not available, use dedicated cloth towels and replace them when they become wet.  Wear a facemask and gloves  Wear a disposable facemask at all times in the room and gloves when you touch or have contact with the patient's blood, body fluids, and/or secretions or excretions, such as sweat, saliva, sputum, nasal mucus, vomit, urine, or feces.  Ensure the mask fits over your nose and mouth tightly, and do not touch it during use.  Throw out disposable facemasks and gloves after using them. Do not reuse.  Wash your hands immediately after removing your facemask and gloves.  If your personal clothing becomes contaminated, carefully remove clothing and launder. Wash  your hands after handling contaminated clothing.  Place all used disposable facemasks, gloves, and other waste in a lined container before disposing them with other household waste.  Remove gloves and wash your hands immediately after handling these items.  Do not share dishes, glasses, or other household items with the patient  Avoid sharing household items. You should not share dishes, drinking glasses, cups, eating utensils, towels, bedding, or other items with a patient who is confirmed to have, or being evaluated for, COVID-19 infection.  After the person uses these items, you should wash them thoroughly with soap and water.  Wash laundry thoroughly  Immediately remove and wash clothes or bedding that have blood, body fluids, and/or secretions or excretions, such as sweat, saliva, sputum, nasal mucus, vomit, urine, or feces, on them.  Wear gloves when handling laundry from the patient.  Read and follow directions on labels of laundry or clothing items and detergent. In general, wash and dry with the warmest temperatures recommended on the label.  Clean all areas the individual has used often  Clean all touchable surfaces, such as counters, tabletops, doorknobs, bathroom fixtures, toilets, phones, keyboards, tablets, and bedside tables, every day. Also, clean any surfaces that may have blood, body fluids, and/or  secretions or excretions on them.  Wear gloves when cleaning surfaces the patient has come in contact with.  Use a diluted bleach solution (e.g., dilute bleach with 1 part bleach and 10 parts water) or a household disinfectant with a label that says EPA-registered for coronaviruses. To make a bleach solution at home, add 1 tablespoon of bleach to 1 quart (4 cups) of water. For a larger supply, add  cup of bleach to 1 gallon (16 cups) of water.  Read labels of cleaning products and follow recommendations provided on product labels. Labels contain instructions for safe and  effective use of the cleaning product including precautions you should take when applying the product, such as wearing gloves or eye protection and making sure you have good ventilation during use of the product.  Remove gloves and wash hands immediately after cleaning.  Monitor yourself for signs and symptoms of illness Caregivers and household members are considered close contacts, should monitor their health, and will be asked to limit movement outside of the home to the extent possible. Follow the monitoring steps for close contacts listed on the symptom monitoring form.   ? If you have additional questions, contact your local health department or call the epidemiologist on call at [phone] (available 24/7). ? This guidance is subject to change. For the most up-to-date guidance from [facility], please refer to their website: [email or web address]

## 2019-06-18 NOTE — Progress Notes (Signed)
Mr. Kees added to Respiratory Illness Call Log.

## 2019-06-18 NOTE — Assessment & Plan Note (Signed)
Very typical of his COPD exacerbations... change in cough and sputum.  Treat with steroid and antibiotics.  ER precautions given.  Pt not interested in COVID testing... aske him to remain home isolated given respiratory symptoms.   Once back to baseline.. he will come in for flu vaccine.

## 2019-06-18 NOTE — Progress Notes (Signed)
VIRTUAL VISIT Due to national recommendations of social distancing due to [name], a virtual visit is felt to be most appropriate for this patient at this time.   I connected with the patient on 06/18/19 at  9:20 AM EDT by virtual telehealth platform and verified that I am speaking with the correct person using two identifiers.   I discussed the limitations, risks, security and privacy concerns of performing an evaluation and management service by  virtual telehealth platform and the availability of in person appointments. I also discussed with the patient that there may be a patient responsible charge related to this service. The patient expressed understanding and agreed to proceed.  Patient location: Home Provider Location: [facility] Participants: Cody Day and [name]   Chief Complaint  Patient presents with  . Cough    yellow phelgm x 1 week  . Heavy Chest Congestion    History of Present Illness:  63 year old male with COPD, Asthma severe presents with new onset cough, productive. Cough This is a new problem. The current episode started in the past 7 days. The problem has been gradually worsening. The problem occurs constantly. The cough is productive of sputum and productive of purulent sputum. Associated symptoms include nasal congestion, rhinorrhea, shortness of breath and wheezing (a). Pertinent negatives include no chills, ear pain, fever or sore throat. Associated symptoms comments: lightheaded. The symptoms are aggravated by lying down. Risk factors for lung disease include smoking/tobacco exposure. He has tried a beta-agonist inhaler and ipratropium inhaler for the symptoms. His past medical history is significant for asthma, COPD and environmental allergies.   Pulmonary MD Dr. Mortimer Fries  Last COPD flare 11/2018  COVID 19 screen No recent travel or known exposure to [place] He has only been to church  The importance of social distancing was discussed today.    Review of Systems  Constitutional: Negative for chills and fever.  HENT: Positive for rhinorrhea. Negative for ear pain and sore throat.   Respiratory: Positive for cough, shortness of breath and wheezing (a).   Endo/Heme/Allergies: Positive for environmental allergies.      Past Medical History:  Diagnosis Date  . Achilles tendon rupture 10/11   and repair  . Adenomatous polyp of colon 04/06/2010   Adenomatous polyps 3 in the descending colon, times one in the ascending colon [facility])  . Asthma   . Gout   . Hemorrhoids   . Hypercholesteremia   . Hypertension   . Sleep apnea    doesn't wear CPAP  . Syncope 10/11   in settin gof asthma exacerbation w coughing. Ech (10/11): EF > 55%, mild LVH, grade I diastolic dysfunction, nomral RV size and systolic function,. normal valves. Carotid US (10/11): minimal disease    reports that he quit smoking about 23 years ago. His smoking use included cigarettes. He has a 8.75 pack-year smoking history. His smokeless tobacco use includes chew. He reports current alcohol use. He reports that he does not use drugs.   Current Outpatient Medications:  .  albuterol (VENTOLIN HFA) 108 (90 Base) MCG/ACT inhaler, Inhale 2 puffs into the lungs every 4 (four) hours as needed for wheezing or shortness of breath., Disp: 1 Inhaler, Rfl: 10 .  allopurinol (ZYLOPRIM) 300 MG tablet, Take 1 tablet (300 mg total) by mouth daily., Disp: 90 tablet, Rfl: 1 .  Cholecalciferol (VITAMIN D3) 1.25 MG (50000 UT) CAPS, TAKE 1 CAPSULE BY MOUTH ONCE A WEEK., Disp: 12 capsule, Rfl: 1 .  citalopram (CELEXA) 20 MG tablet, Take 1 tablet (20 mg total) by mouth daily., Disp: 90 tablet, Rfl: 1 .  fluticasone (FLOVENT HFA) 220 MCG/ACT inhaler, Inhale 2 puffs into the lungs 2 (two) times daily., Disp: 1 Inhaler, Rfl: 5 .  lisinopril (PRINIVIL,ZESTRIL) 20 MG tablet, TAKE ONE (1) TABLET EACH DAY, Disp: 90 tablet, Rfl: 1 .  montelukast (SINGULAIR) 10 MG tablet, Take 1 tablet (10  mg total) by mouth daily., Disp: 90 tablet, Rfl: 1 .  Multiple Vitamin (MULTIVITAMIN WITH MINERALS) TABS tablet, Take 1 tablet by mouth daily., Disp: 30 tablet, Rfl: 2 .  simvastatin (ZOCOR) 20 MG tablet, TAKE 1 TABLET BY MOUTH DAILY, Disp: 90 tablet, Rfl: 3 .  STIOLTO RESPIMAT 2.5-2.5 MCG/ACT AERS, Use two inhalations every day, Disp: 4 g, Rfl: 5 .  testosterone cypionate (DEPOTESTOSTERONE CYPIONATE) 200 MG/ML injection, Inject 0.3 mLs (60 mg total) into the muscle once a week. 0.5 ml once weekly, Disp: 10 mL, Rfl: 0   Observations/Objective: Blood pressure (!) 128/91, pulse 78, temperature 98 F (36.7 C), temperature source Oral, height 5\' 11"  (1.803 m), weight 243 lb (110.2 kg).  Physical Exam  Physical Exam Constitutional:      General: The patient is not in acute distress. Pulmonary:     Effort: Pulmonary effort is normal. No respiratory distress.  Neurological:     Mental Status: The patient is alert and oriented to person, place, and time.  Psychiatric:        Mood and Affect: Mood normal.        Behavior: Behavior normal.   Assessment and Plan COPD exacerbation ([place]) Very typical of his COPD exacerbations... change in cough and sputum.  Treat with steroid and antibiotics.  ER precautions given.  Pt not interested in COVID testing... aske him to remain home isolated given respiratory symptoms.   Once back to baseline.. he will come in for flu vaccine.     I discussed the assessment and treatment plan with the patient. The patient was provided an opportunity to ask questions and all were answered. The patient agreed with the plan and demonstrated an understanding of the instructions.   The patient was advised to call back or seek an in-person evaluation if the symptoms worsen or if the condition fails to improve as anticipated.     Cody Lofts, MD

## 2019-06-18 NOTE — Addendum Note (Signed)
Addended by: Lurlean Nanny on: 06/18/2019 02:51 PM   Modules accepted: Orders

## 2019-06-23 ENCOUNTER — Ambulatory Visit: Payer: Medicare Other

## 2019-06-25 ENCOUNTER — Telehealth: Payer: Self-pay | Admitting: *Deleted

## 2019-06-25 NOTE — Telephone Encounter (Signed)
God. HAs improved. Take off respiratory call back list.

## 2019-06-25 NOTE — Telephone Encounter (Signed)
Left message for Cody Day to return call.  He is on my Respiratory Illness Log so I was calling to see how he was doing since his visit last week with Dr. Diona Browner.

## 2019-06-25 NOTE — Telephone Encounter (Signed)
Pt called back; pt had virtual appt on 06/18/19;pt is feeling better but still has prod cough with thick white phlegm; no fever; Pt said he has COPD and asthma and he thinks he is leveling off since finishing abx and prednisone. Pt will cb if condition changes or worsens.pt appreciated the call to ck on him. FYI to [employer] CMA and Dr Diona Browner.

## 2019-07-07 ENCOUNTER — Ambulatory Visit
Admission: RE | Admit: 2019-07-07 | Discharge: 2019-07-07 | Disposition: A | Discharge status: Home / Self Care | Payer: Medicare Other | Source: Ambulatory Visit | Attending: Urology | Admitting: Urology

## 2019-07-07 ENCOUNTER — Other Ambulatory Visit: Payer: Self-pay

## 2019-07-07 DIAGNOSIS — R972 Elevated prostate specific antigen [PSA]: Secondary | ICD-10-CM

## 2019-07-07 LAB — POCT I-STAT CREATININE: Creatinine, Ser: 0.8 mg/dL (ref 0.61–1.24)

## 2019-07-07 MED ORDER — GADOBUTROL 1 MMOL/ML IV SOLN
10.0000 mL | Freq: Once | INTRAVENOUS | Status: AC | PRN
  Administered 2019-07-07: 10 mL via INTRAVENOUS

## 2019-07-09 ENCOUNTER — Encounter: Payer: Self-pay | Admitting: Urology

## 2019-07-09 ENCOUNTER — Telehealth: Payer: Self-pay | Admitting: Urology

## 2019-07-09 DIAGNOSIS — R972 Elevated prostate specific antigen [PSA]: Secondary | ICD-10-CM

## 2019-07-09 NOTE — Telephone Encounter (Signed)
Pt called office asking about MRI results.  He had MRI on Wednesday 9/16.

## 2019-07-09 NOTE — Telephone Encounter (Signed)
I sent the patient a MyChart message.  Referral order placed for a fusion biopsy at [facility]

## 2019-07-09 NOTE — Telephone Encounter (Signed)
Please advise, pt is not scheduled until December.

## 2019-07-14 ENCOUNTER — Other Ambulatory Visit: Payer: Self-pay | Admitting: Family Medicine

## 2019-07-14 ENCOUNTER — Other Ambulatory Visit: Payer: Self-pay

## 2019-07-14 DIAGNOSIS — E291 Testicular hypofunction: Secondary | ICD-10-CM

## 2019-07-16 ENCOUNTER — Other Ambulatory Visit: Payer: Self-pay | Admitting: Family Medicine

## 2019-07-18 MED ORDER — TESTOSTERONE CYPIONATE 200 MG/ML IM SOLN: 60.0000 mg | INTRAMUSCULAR | 0 refills | Status: DC

## 2019-07-19 ENCOUNTER — Telehealth: Payer: Self-pay | Admitting: Urology

## 2019-07-19 NOTE — Telephone Encounter (Signed)
Pharmacy has question of quantity, Rx says to dispense 87mls, pharmacy only has bottles of 10. Please clarify or advise at [phone].

## 2019-07-19 NOTE — Telephone Encounter (Signed)
Called pharmacy changed quantity to 27mL vial instead of 67ml.

## 2019-07-29 ENCOUNTER — Ambulatory Visit (INDEPENDENT_AMBULATORY_CARE_PROVIDER_SITE_OTHER): Payer: Medicare Other

## 2019-07-29 DIAGNOSIS — Z23 Encounter for immunization: Secondary | ICD-10-CM

## 2019-08-04 ENCOUNTER — Other Ambulatory Visit: Payer: Self-pay

## 2019-08-04 ENCOUNTER — Ambulatory Visit (INDEPENDENT_AMBULATORY_CARE_PROVIDER_SITE_OTHER): Payer: Medicare Other | Admitting: Nurse Practitioner

## 2019-08-04 ENCOUNTER — Encounter (INDEPENDENT_AMBULATORY_CARE_PROVIDER_SITE_OTHER): Payer: Self-pay | Admitting: Nurse Practitioner

## 2019-08-04 VITALS — BP 144/81 | HR 92 | Resp 14 | Ht 71.0 in | Wt 248.0 lb

## 2019-08-04 DIAGNOSIS — J441 Chronic obstructive pulmonary disease with (acute) exacerbation: Secondary | ICD-10-CM | POA: Diagnosis not present

## 2019-08-04 DIAGNOSIS — E785 Hyperlipidemia, unspecified: Secondary | ICD-10-CM | POA: Diagnosis not present

## 2019-08-04 DIAGNOSIS — I83813 Varicose veins of bilateral lower extremities with pain: Secondary | ICD-10-CM

## 2019-08-09 ENCOUNTER — Encounter (INDEPENDENT_AMBULATORY_CARE_PROVIDER_SITE_OTHER): Payer: Self-pay | Admitting: Nurse Practitioner

## 2019-08-09 DIAGNOSIS — R972 Elevated prostate specific antigen [PSA]: Secondary | ICD-10-CM | POA: Diagnosis not present

## 2019-08-09 NOTE — Progress Notes (Signed)
SUBJECTIVE:  Patient ID: Cody Day, male    DOB: November 15, 1955, 63 y.o.   MRN: [id number] Chief Complaint  Patient presents with   Follow-up    HPI  Cody Day is a 63 y.o. male The patient returns for followup evaluation 3 months after the initial visit. The patient continues to have pain in the lower extremities with dependency. The pain is lessened with elevation. Graduated compression stockings, Class I (20-30 mmHg), have been worn but the stockings do not eliminate the leg pain. Over-the-counter analgesics do not improve the symptoms. The degree of discomfort continues to interfere with daily activities. The patient notes the pain in the legs is causing problems with daily exercise, at the workplace and even with household activities and maintenance such as standing in the kitchen preparing meals and doing dishes.   Venous ultrasound shows reflux in the bilateral common femoral veins, no evidence of acute or chronic DVT.  Superficial reflux is present in the bilateral great saphenous veins  Past Medical History:  Diagnosis Date   Achilles tendon rupture 10/11   and repair   Adenomatous polyp of colon 04/06/2010   Adenomatous polyps 3 in the descending colon, times one in the ascending colon [facility])   Asthma    Gout    Hemorrhoids    Hypercholesteremia    Hypertension    Sleep apnea    doesn't wear CPAP   Syncope 10/11   in settin gof asthma exacerbation w coughing. Ech (10/11): EF > 55%, mild LVH, grade I diastolic dysfunction, nomral RV size and systolic function,. normal valves. Carotid US (10/11): minimal disease    Past Surgical History:  Procedure Laterality Date   ABDOMINAL HERNIA REPAIR  1998,2000   calcium kidney stones     [facility]    COLONOSCOPY  2011   [facility], [name], [place]; Dr. [name]. Multiple adenomatous polyps (total 4).    COLONOSCOPY WITH PROPOFOL N/A 08/30/2015   Procedure: COLONOSCOPY WITH PROPOFOL;  Surgeon: [name], MD;  Location: [facility] ENDOSCOPY;  Service: Endoscopy;  Laterality: N/A;   FEMORAL HERNIA REPAIR  2007   HEMORRHOID SURGERY     HERNIA REPAIR  09/10/2010   Repair of recurrent ventral hernia, resection of previously placed mesh x2, implantation 7.5 x 10 cm Gore-Tex dual mesh in an underlay position, repair of epigastric hernia with a 4.2 cm Proceed ventral patch.   HERNIA REPAIR   02//28/2007   Laparoscopic right inguinal hernia repair with Surgipro mesh, Ventrilex patch at umbilicus   HERNIA REPAIR  11/01/1990  .   Small oval Kugel patch placed in preperitoneal space, [name], MD   HERNIA REPAIR  01/17/1997   Recurrent ventral hernia 15 x 19 Gore-Tex dual mesh placed laparoscopically with multiple lefft--sided ports, [name], MD   HERNIA REPAIR  01/07/1995    Primary repair of ventral hernia. [name], MD   intestinal blockage     as a child   KIDNEY STONE extraction     unic acid stones   left heart cath  2008   minimal luminal irregularities EF 55%   RIGHT/LEFT HEART CATH AND CORONARY ANGIOGRAPHY N/A 01/13/2017   Procedure: Right/Left Heart Cath and Coronary Angiography;  Surgeon: [name], MD;  Location: [facility];  Service: Cardiovascular;  Laterality: N/A;   treadmill stress test  2003    Social History   Socioeconomic History   Marital status: Married  Spouse name: Not on file   Number of children: Y   Years of education: Not on file   Highest education level: Not on file  Occupational History   Occupation: Corporate treasurer: [employer] resource strain: Not on file   Food insecurity    Worry: Not on file    Inability: Not on file   Transportation needs    Medical: Not on file    Non-medical: Not on file  Tobacco Use   Smoking status: Former Smoker    Packs/day: 0.25    Years: 35.00    Pack years: 8.75     Types: Cigarettes    Quit date: 10/22/1995    Years since quitting: 23.8   Smokeless tobacco: Current User    Types: Chew   Tobacco comment: rarely smoked cigarettes and cigars.  states he smoked drugs mostly  Substance and Sexual Activity   Alcohol use: Yes    Alcohol/week: 0.0 standard drinks    Comment: 6 beers daily; used to use cocaine and methamphetamine   Drug use: No    Types: Marijuana, Methamphetamines    Comment: former smokerx 27 years   Sexual activity: Yes    Birth control/protection: None  Lifestyle   Physical activity    Days per week: Not on file    Minutes per session: Not on file   Stress: Not on file  Relationships   Social connections    Talks on phone: Not on file    Gets together: Not on file    Attends religious service: Not on file    Active member of club or organization: Not on file    Attends meetings of clubs or organizations: Not on file    Relationship status: Not on file   Intimate partner violence    Fear of current or ex partner: Not on file    Emotionally abused: Not on file    Physically abused: Not on file    Forced sexual activity: Not on file  Other Topics Concern   Not on file  Social History Narrative   Poor diet, lots of fats. Does not regularly exercise. Married. Originally from [place].     Family History  Problem Relation Age of Onset   Hypertension Father    Cataracts Father    Diabetes Father    Heart attack Mother 13   Coronary artery disease Mother    Dementia Mother    Hypertension Sister    Depression Sister    Hypertension Brother    Colon cancer Brother    Diabetes Other        1st degree relative   Colon cancer Maternal Aunt     Allergies  Allergen Reactions   Levaquin [Levofloxacin In D5w]     Severe headaches, skin tingling/redness   Penicillins Hives and Rash    Has patient had a PCN reaction causing immediate rash, facial/tongue/throat swelling, SOB or lightheadedness with  hypotension: Yes Has patient had a PCN reaction causing severe rash involving mucus membranes or skin necrosis: Unknown Has patient had a PCN reaction that required hospitalization: No Has patient had a PCN reaction occurring within the last 10 years: No - Childhood reaction If all of the above answers are "NO", then may proceed with Cephalosporin use.      Review of Systems   Review of Systems: Negative Unless Checked Constitutional: [] Weight loss  [] Fever  [] Chills Cardiac: [] Chest pain   []   Atrial Fibrillation  [] Palpitations   [] Shortness of breath when laying flat   [] Shortness of breath with exertion. [] Shortness of breath at rest Vascular:  [] Pain in legs with walking   [] Pain in legs with standing [] Pain in legs when laying flat   [] Claudication    [] Pain in feet when laying flat    [] History of DVT   [] Phlebitis   [x] Swelling in legs   [x] Varicose veins   [] Non-healing ulcers Pulmonary:   [] Uses home oxygen   [] Productive cough   [] Hemoptysis   [] Wheeze  [] COPD   [] Asthma Neurologic:  [] Dizziness   [] Seizures  [] Blackouts [] History of stroke   [] History of TIA  [] Aphasia   [] Temporary Blindness   [] Weakness or numbness in arm   [] Weakness or numbness in leg Musculoskeletal:   [] Joint swelling   [] Joint pain   [] Low back pain  []  History of Knee Replacement [x] Arthritis [] back Surgeries  []  Spinal Stenosis    Hematologic:  [] Easy bruising  [] Easy bleeding   [] Hypercoagulable state   [] Anemic Gastrointestinal:  [] Diarrhea   [] Vomiting  [] Gastroesophageal reflux/heartburn   [] Difficulty swallowing. [] Abdominal pain Genitourinary:  [] Chronic kidney disease   [] Difficult urination  [] Anuric   [] Blood in urine [] Frequent urination  [] Burning with urination   [] Hematuria Skin:  [x] Rashes   [] Ulcers [] Wounds Psychological:  [] History of anxiety   [x]  History of major depression  []  Memory Difficulties      OBJECTIVE:   Physical Exam  BP (!) 144/81 (BP Location: Left Arm, Patient  Position: Sitting, Cuff Size: Large)    Pulse 92    Resp 14    Ht 5\' 11"  (1.803 m)    Wt 248 lb (112.5 kg)    BMI 34.59 kg/m   Gen: WD/WN, NAD Head: [place]/AT, No temporalis wasting.  Ear/Nose/Throat: Hearing grossly intact, nares w/o erythema or drainage Eyes: PER, EOMI, sclera nonicteric.  Neck: Supple, no masses.  No JVD.  Pulmonary:  Good air movement, no use of accessory muscles.  Cardiac: RRR Vascular: scattered varicosities present bilaterally.  Mild venous stasis changes to the legs bilaterally.  2+ soft pitting edema  Vessel Right Left  Radial Palpable Palpable  Dorsalis Pedis Palpable Palpable  Posterior Tibial Palpable Palpable   Gastrointestinal: soft, non-distended. No guarding/no peritoneal signs.  Musculoskeletal: M/S 5/5 throughout.  No deformity or atrophy.  Neurologic: Pain and light touch intact in extremities.  Symmetrical.  Speech is fluent. Motor exam as listed above. Psychiatric: Judgment intact, Mood & affect appropriate for pt's clinical situation. Dermatologic: No Ulcers Noted.  No changes consistent with cellulitis. Lymph : No Cervical lymphadenopathy, no lichenification or skin changes of chronic lymphedema.       ASSESSMENT AND PLAN:  1. Varicose veins of leg with pain, bilateral Recommend  I have reviewed my previous  discussion with the patient regarding  varicose veins and why they cause symptoms. Patient will continue  wearing graduated compression stockings class 1 on a daily basis, beginning first thing in the morning and removing them in the evening.    In addition, behavioral modification including elevation during the day was again discussed and this will continue.  The patient has utilized over the counter pain medications and has been exercising.  However, at this time conservative therapy has not alleviated the patient's symptoms of leg pain and swelling  Recommend: laser ablation of the right and  left great saphenous veins to eliminate the  symptoms of pain and swelling of the lower extremities caused by  the severe superficial venous reflux disease.   2. COPD exacerbation ([place]) Continue pulmonary medications and aerosols as already ordered, these medications have been reviewed and there are no changes at this time.    3. Hyperlipidemia LDL goal <70 Continue statin as ordered and reviewed, no changes at this time    Current Outpatient Medications on File Prior to Visit  Medication Sig Dispense Refill   albuterol (VENTOLIN HFA) 108 (90 Base) MCG/ACT inhaler Inhale 2 puffs into the lungs every 4 (four) hours as needed for wheezing or shortness of breath. 1 Inhaler 10   allopurinol (ZYLOPRIM) 300 MG tablet TAKE 1 TABLET BY MOUTH DAILY 90 tablet 1   citalopram (CELEXA) 20 MG tablet Take 1 tablet (20 mg total) by mouth daily. 90 tablet 1   fluticasone (FLOVENT HFA) 220 MCG/ACT inhaler Inhale 2 puffs into the lungs 2 (two) times daily. 1 Inhaler 5   lisinopril (ZESTRIL) 20 MG tablet TAKE 1 TABLET BY MOUTH DAILY 90 tablet 1   montelukast (SINGULAIR) 10 MG tablet TAKE 1 TABLET BY MOUTH DAILY 90 tablet 1   Multiple Vitamin (MULTIVITAMIN WITH MINERALS) TABS tablet Take 1 tablet by mouth daily. 30 tablet 2   predniSONE (DELTASONE) 20 MG tablet 3 tabs by mouth daily x 3 days, then 2 tabs by mouth daily x 2 days then 1 tab by mouth daily x 2 days 15 tablet 0   simvastatin (ZOCOR) 20 MG tablet TAKE 1 TABLET BY MOUTH DAILY 90 tablet 3   STIOLTO RESPIMAT 2.5-2.5 MCG/ACT AERS Use two inhalations every day 4 g 5   testosterone cypionate (DEPOTESTOSTERONE CYPIONATE) 200 MG/ML injection Inject 0.3 mLs (60 mg total) into the muscle once a week. 6 mL 0   No current facility-administered medications on file prior to visit.     There are no Patient Instructions on file for this visit. No follow-ups on file.   Kris Hartmann, NP  This note was completed with Sales executive.  Any errors are purely unintentional.

## 2019-08-12 ENCOUNTER — Other Ambulatory Visit: Payer: Self-pay | Admitting: Urology

## 2019-08-15 ENCOUNTER — Other Ambulatory Visit: Payer: Self-pay | Admitting: Urology

## 2019-08-15 ENCOUNTER — Telehealth: Payer: Self-pay | Admitting: Urology

## 2019-08-15 DIAGNOSIS — R935 Abnormal findings on diagnostic imaging of other abdominal regions, including retroperitoneum: Secondary | ICD-10-CM

## 2019-08-15 DIAGNOSIS — R972 Elevated prostate specific antigen [PSA]: Secondary | ICD-10-CM

## 2019-08-15 NOTE — Telephone Encounter (Signed)
Please notify patient that prostate biopsies were negative for cancer.

## 2019-08-16 NOTE — Telephone Encounter (Signed)
Patient notified on vmail per DPR 

## 2019-08-18 ENCOUNTER — Other Ambulatory Visit: Payer: Self-pay | Admitting: Family Medicine

## 2019-08-23 ENCOUNTER — Ambulatory Visit (INDEPENDENT_AMBULATORY_CARE_PROVIDER_SITE_OTHER): Payer: Medicare Other | Admitting: Urology

## 2019-08-23 ENCOUNTER — Encounter: Payer: Self-pay | Admitting: Urology

## 2019-08-23 ENCOUNTER — Other Ambulatory Visit: Payer: Self-pay

## 2019-08-23 VITALS — BP 136/66 | HR 92 | Wt 248.0 lb

## 2019-08-23 DIAGNOSIS — E291 Testicular hypofunction: Secondary | ICD-10-CM | POA: Diagnosis not present

## 2019-08-23 DIAGNOSIS — R972 Elevated prostate specific antigen [PSA]: Secondary | ICD-10-CM

## 2019-08-23 NOTE — Progress Notes (Signed)
08/23/2019 1:07 PM   [name] [birth year] [id number]  Referring provider: [name], MD [address] [place],  [place] [address]  Chief Complaint  Patient presents with  . Follow-up    HPI: 63 y.o. male presents for prostate biopsy follow-up.  He is on TRT and had an abnormal PSA velocity.  Prostate MRI remarkable for a PI-RADS 3 lesions mid gland peripheral zone.  He underwent MR targeted biopsy at [facility] 08/09/2019.  All biopsies showed benign prostate tissue.   PMH: Past Medical History:  Diagnosis Date  . Achilles tendon rupture 10/11   and repair  . Adenomatous polyp of colon 04/06/2010   Adenomatous polyps 3 in the descending colon, times one in the ascending colon [facility])  . Asthma   . Gout   . Hemorrhoids   . Hypercholesteremia   . Hypertension   . Sleep apnea    doesn't wear CPAP  . Syncope 10/11   in settin gof asthma exacerbation w coughing. Ech (10/11): EF > 55%, mild LVH, grade I diastolic dysfunction, nomral RV size and systolic function,. normal valves. Carotid US (10/11): minimal disease    Surgical History: Past Surgical History:  Procedure Laterality Date  . ABDOMINAL HERNIA REPAIR  1998,2000  . calcium kidney stones    . [facility], [name], [place]; Dr. [name]. Multiple adenomatous polyps (total 4).   . COLONOSCOPY WITH PROPOFOL N/A 08/30/2015   Procedure: COLONOSCOPY WITH PROPOFOL;  Surgeon: [name], MD;  Location: [facility] ENDOSCOPY;  Service: Endoscopy;  Laterality: N/A;  . FEMORAL HERNIA REPAIR  2007  . HEMORRHOID SURGERY    . HERNIA REPAIR  09/10/2010   Repair of recurrent ventral hernia, resection of previously placed mesh x2, implantation 7.5 x 10 cm Gore-Tex dual mesh in an underlay position, repair of epigastric hernia with a 4.2 cm Proceed ventral patch.  Marland Kitchen HERNIA REPAIR   02//28/2007   Laparoscopic right inguinal hernia  repair with Surgipro mesh, Ventrilex patch at umbilicus  . HERNIA REPAIR  11/01/1990  .   Small oval Kugel patch placed in preperitoneal space, [name], MD  . HERNIA REPAIR  01/17/1997   Recurrent ventral hernia 15 x 19 Gore-Tex dual mesh placed laparoscopically with multiple lefft--sided ports, [name], MD  . HERNIA REPAIR  01/07/1995    Primary repair of ventral hernia. [name], MD  . intestinal blockage     as a child  . KIDNEY STONE extraction     unic acid stones  . left heart cath  2008   minimal luminal irregularities EF 55%  . RIGHT/LEFT HEART CATH AND CORONARY ANGIOGRAPHY N/A 01/13/2017   Procedure: Right/Left Heart Cath and Coronary Angiography;  Surgeon: [name], MD;  Location: [facility];  Service: Cardiovascular;  Laterality: N/A;  . treadmill stress test  2003    Home Medications:  Allergies as of 08/23/2019      Reactions   Levaquin [levofloxacin In D5w]    Severe headaches, skin tingling/redness   Penicillins Hives, Rash   Has patient had a PCN reaction causing immediate rash, facial/tongue/throat swelling, SOB or lightheadedness with hypotension: Yes Has patient had a PCN reaction causing severe rash involving mucus membranes or skin necrosis: Unknown Has patient had a PCN reaction that required hospitalization: No Has patient had a PCN reaction occurring within the last 10 years: No -  Childhood reaction If all of the above answers are "NO", then may proceed with Cephalosporin use.      Medication List       Accurate as of August 23, 2019  1:07 PM. If you have any questions, ask your nurse or doctor.        STOP taking these medications   predniSONE 20 MG tablet Commonly known as: DELTASONE Stopped by: Abbie Sons, MD     TAKE these medications   albuterol 108 (90 Base) MCG/ACT inhaler Commonly known as: Ventolin HFA Inhale 2 puffs into the lungs every 4 (four) hours as needed for wheezing or shortness of breath.    allopurinol 300 MG tablet Commonly known as: ZYLOPRIM TAKE 1 TABLET BY MOUTH DAILY   citalopram 20 MG tablet Commonly known as: CELEXA TAKE 1 TABLET BY MOUTH DAILY   fluticasone 220 MCG/ACT inhaler Commonly known as: FLOVENT HFA Inhale 2 puffs into the lungs 2 (two) times daily.   lisinopril 20 MG tablet Commonly known as: ZESTRIL TAKE 1 TABLET BY MOUTH DAILY   montelukast 10 MG tablet Commonly known as: SINGULAIR TAKE 1 TABLET BY MOUTH DAILY   multivitamin with minerals Tabs tablet Take 1 tablet by mouth daily.   simvastatin 20 MG tablet Commonly known as: ZOCOR TAKE 1 TABLET BY MOUTH DAILY   Stiolto Respimat 2.5-2.5 MCG/ACT Aers Generic drug: Tiotropium Bromide-Olodaterol Use two inhalations every day   testosterone cypionate 200 MG/ML injection Commonly known as: DEPOTESTOSTERONE CYPIONATE Inject 0.3 mLs (60 mg total) into the muscle once a week.       Allergies:  Allergies  Allergen Reactions  . Levaquin [Levofloxacin In D5w]     Severe headaches, skin tingling/redness  . Penicillins Hives and Rash    Has patient had a PCN reaction causing immediate rash, facial/tongue/throat swelling, SOB or lightheadedness with hypotension: Yes Has patient had a PCN reaction causing severe rash involving mucus membranes or skin necrosis: Unknown Has patient had a PCN reaction that required hospitalization: No Has patient had a PCN reaction occurring within the last 10 years: No - Childhood reaction If all of the above answers are "NO", then may proceed with Cephalosporin use.     Family History: Family History  Problem Relation Age of Onset  . Hypertension Father   . Cataracts Father   . Diabetes Father   . Heart attack Mother 61  . Coronary artery disease Mother   . Dementia Mother   . Hypertension Sister   . Depression Sister   . Hypertension Brother   . Colon cancer Brother   . Diabetes Other        1st degree relative  . Colon cancer Maternal Aunt      Social History:  reports that he quit smoking about 23 years ago. His smoking use included cigarettes. He has a 8.75 pack-year smoking history. His smokeless tobacco use includes chew. He reports current alcohol use. He reports that he does not use drugs.  ROS: UROLOGY Frequent Urination?: No Hard to postpone urination?: No Burning/pain with urination?: No Get up at night to urinate?: No Leakage of urine?: No Urine stream starts and stops?: No Trouble starting stream?: No Do you have to strain to urinate?: No Blood in urine?: No Urinary tract infection?: No Sexually transmitted disease?: No Injury to kidneys or bladder?: No Painful intercourse?: No Weak stream?: No Erection problems?: No Penile pain?: No  Gastrointestinal Nausea?: No Vomiting?: No Indigestion/heartburn?: No Diarrhea?: No Constipation?: No  Constitutional  Fever: No Night sweats?: No Weight loss?: No Fatigue?: No  Skin Skin rash/lesions?: No Itching?: No  Eyes Blurred vision?: No Double vision?: No  Ears/Nose/Throat Sore throat?: No Sinus problems?: No  Hematologic/Lymphatic Swollen glands?: No Easy bruising?: No  Cardiovascular Leg swelling?: Yes Chest pain?: No  Respiratory Cough?: No Shortness of breath?: Yes  Endocrine Excessive thirst?: No  Musculoskeletal Back pain?: No Joint pain?: No  Neurological Headaches?: No Dizziness?: No  Psychologic Depression?: No Anxiety?: No  Physical Exam: BP 136/66   Pulse 92   Wt 248 lb (112.5 kg)   BMI 34.59 kg/m   Constitutional:  Alert and oriented, No acute distress. HEENT: [place] AT, moist mucus membranes.  Trachea midline, no masses. Cardiovascular: No clubbing, cyanosis, or edema. Respiratory: Normal respiratory effort, no increased work of breathing.   Assessment & Plan:    - Abnormal PSA velocity Benign fusion biopsy.  Recheck PSA 6 months.  - Hypogonadism in male He desires to continue TRT.  His last injection was 4  days ago.  Testosterone and hematocrit were drawn.  If stable follow-up 6 months.  Return in about 6 months (around 02/20/2020) for Recheck, PSA, testosterone, hematocrit.   Abbie Sons, [place] [address], [place], [place] [address] [phone]

## 2019-08-24 ENCOUNTER — Telehealth: Payer: Self-pay

## 2019-08-24 DIAGNOSIS — E291 Testicular hypofunction: Secondary | ICD-10-CM

## 2019-08-24 LAB — TESTOSTERONE: Testosterone: >1500 ng/dL — ABNORMAL HIGH (ref 264–916)

## 2019-08-24 NOTE — Telephone Encounter (Signed)
-----   Message from Abbie Sons, MD sent at 08/24/2019  9:21 AM EST ----- Testosterone level was >1500.  He states he injected last Friday.  If he is injecting weekly will need to decrease the dose.  I had recommended 0.3 cc (60 mg).  I think he said he injected 0.5 cc (100 mg).  Please verify.  Recommend a repeat midcycle testosterone level in 4-6 weeks.

## 2019-08-24 NOTE — Telephone Encounter (Signed)
Called pt no answer. LM for pt informing him of the information below as well as the need to call back to verify dosing. Will also send mychart message. Lab ordered.

## 2019-08-27 ENCOUNTER — Telehealth: Payer: Self-pay | Admitting: Family Medicine

## 2019-08-27 DIAGNOSIS — M1A9XX0 Chronic gout, unspecified, without tophus (tophi): Secondary | ICD-10-CM

## 2019-08-27 DIAGNOSIS — E785 Hyperlipidemia, unspecified: Secondary | ICD-10-CM

## 2019-08-27 DIAGNOSIS — R7303 Prediabetes: Secondary | ICD-10-CM

## 2019-08-27 NOTE — Telephone Encounter (Signed)
-----   Message from Ellamae Sia sent at 08/25/2019 10:57 AM EST ----- Regarding: Lab orders for Monday, 11.9.20  Lab orders for a 6 month follow up appt

## 2019-08-30 ENCOUNTER — Other Ambulatory Visit: Payer: Self-pay

## 2019-08-30 ENCOUNTER — Other Ambulatory Visit (INDEPENDENT_AMBULATORY_CARE_PROVIDER_SITE_OTHER): Payer: Medicare Other

## 2019-08-30 DIAGNOSIS — E785 Hyperlipidemia, unspecified: Secondary | ICD-10-CM

## 2019-08-30 DIAGNOSIS — R7303 Prediabetes: Secondary | ICD-10-CM

## 2019-08-30 LAB — COMPREHENSIVE METABOLIC PANEL
ALT: 18 U/L (ref 0–53)
AST: 18 U/L (ref 0–37)
Albumin: 4.1 g/dL (ref 3.5–5.2)
Alkaline Phosphatase: 53 U/L (ref 39–117)
BUN: 9 mg/dL (ref 6–23)
CO2: 30 mEq/L (ref 19–32)
Calcium: 8.8 mg/dL (ref 8.4–10.5)
Chloride: 100 mEq/L (ref 96–112)
Creatinine, Ser: 0.79 mg/dL (ref 0.40–1.50)
GFR: 99.13 mL/min (ref >60.00)
Glucose, Bld: 103 mg/dL — ABNORMAL HIGH (ref 70–99)
Potassium: 4.1 mEq/L (ref 3.5–5.1)
Sodium: 136 mEq/L (ref 135–145)
Total Bilirubin: 1.1 mg/dL (ref 0.2–1.2)
Total Protein: 6.4 g/dL (ref 6.0–8.3)

## 2019-08-30 LAB — LIPID PANEL
Cholesterol: 123 mg/dL (ref 0–200)
HDL: 51 mg/dL (ref >39.00)
LDL Cholesterol: 57 mg/dL (ref 0–99)
NonHDL: 72.08
Total CHOL/HDL Ratio: 2
Triglycerides: 76 mg/dL (ref 0.0–149.0)
VLDL: 15.2 mg/dL (ref 0.0–40.0)

## 2019-08-30 LAB — HEMOGLOBIN A1C: Hgb A1c MFr Bld: 6.3 % (ref 4.6–6.5)

## 2019-08-31 ENCOUNTER — Ambulatory Visit: Payer: Medicare Other | Admitting: Family Medicine

## 2019-08-31 ENCOUNTER — Ambulatory Visit (INDEPENDENT_AMBULATORY_CARE_PROVIDER_SITE_OTHER): Payer: Medicare Other | Admitting: Family Medicine

## 2019-08-31 ENCOUNTER — Encounter: Payer: Self-pay | Admitting: Family Medicine

## 2019-08-31 VITALS — BP 141/90 | HR 83 | Ht 71.0 in | Wt 248.0 lb

## 2019-08-31 DIAGNOSIS — E785 Hyperlipidemia, unspecified: Secondary | ICD-10-CM | POA: Diagnosis not present

## 2019-08-31 DIAGNOSIS — I10 Essential (primary) hypertension: Secondary | ICD-10-CM | POA: Diagnosis not present

## 2019-08-31 DIAGNOSIS — R7303 Prediabetes: Secondary | ICD-10-CM

## 2019-08-31 DIAGNOSIS — F321 Major depressive disorder, single episode, moderate: Secondary | ICD-10-CM

## 2019-08-31 MED ORDER — CITALOPRAM HYDROBROMIDE 40 MG PO TABS: 40.0000 mg | ORAL_TABLET | Freq: Every day | ORAL | 5 refills | Status: DC

## 2019-08-31 NOTE — Assessment & Plan Note (Signed)
Moderate control.. increase cleexa to 40 mg daily.

## 2019-08-31 NOTE — Assessment & Plan Note (Signed)
At goal on simvastatin.

## 2019-08-31 NOTE — Progress Notes (Signed)
VIRTUAL VISIT Due to national recommendations of social distancing due to [place] 19, a virtual visit is felt to be most appropriate for this patient at this time.   I connected with the patient on 08/31/19 at  9:20 AM EST by virtual telehealth platform and verified that I am speaking with the correct person using two identifiers.    I discussed the limitations, risks, security and privacy concerns of performing an evaluation and management service by  virtual telehealth platform and the availability of in person appointments. I also discussed with the patient that there may be a patient responsible charge related to this service. The patient expressed understanding and agreed to proceed.  Patient location: Home Provider Location: [facility] Participants: Eliezer Lofts and [name]   Chief Complaint  Patient presents with  . Follow-up    6 month    History of Present Illness: 63 year old male with  severe COPD, asthma presents for 6 month follow up.  Prediabetes  Has stopped tea and soda. Lab Results  Component Value Date   HGBA1C 6.3 08/30/2019   Wt Readings from Last 3 Encounters:  08/31/19 248 lb (112.5 kg)  08/23/19 248 lb (112.5 kg)  08/04/19 248 lb (112.5 kg)   Elevated Cholesterol: LDL at goal on simvastatin 20 mg daily Using medications without problems: Muscle aches:  Diet compliance: good Exercise: minimal Other complaints:  Hypertension:  Borderline control on home measurement today on lisinopril.   BP Readings from Last 3 Encounters:  08/31/19 (!) 141/90  08/23/19 136/66  08/04/19 (!) 144/81  Using medication without problems or lightheadedness: none Chest pain with exertion:none Edema:none Short of breath: stable, on oxygen Average home BPs: Other issues:  MDD.Marland Kitchen moderate control.. No motivation, frustrated. Wishes to try higher dose of med.    Seeing vascular for leg pain Plan: laser ablation of the right and  left great saphenous veins to  eliminate the symptoms of pain and swelling of the lower extremities caused by the severe superficial venous reflux disease.   COVID 19 screen No recent travel or known exposure to COVID19 The patient denies respiratory symptoms of COVID 19 at this time.  The importance of social distancing was discussed today.   Review of Systems  Constitutional: Negative for chills and fever.  HENT: Negative for congestion and ear pain.   Eyes: Negative for pain and redness.  Respiratory: Positive for shortness of breath. Negative for cough.   Cardiovascular: Negative for chest pain, palpitations and leg swelling.  Gastrointestinal: Negative for abdominal pain, blood in stool, constipation, diarrhea, nausea and vomiting.  Genitourinary: Negative for dysuria.  Musculoskeletal: Negative for falls and myalgias.  Skin: Negative for rash.  Neurological: Negative for dizziness.  Psychiatric/Behavioral: Negative for depression. The patient is not nervous/anxious.       Past Medical History:  Diagnosis Date  . Achilles tendon rupture 10/11   and repair  . Adenomatous polyp of colon 04/06/2010   Adenomatous polyps 3 in the descending colon, times one in the ascending colon [facility])  . Asthma   . Gout   . Hemorrhoids   . Hypercholesteremia   . Hypertension   . Sleep apnea    doesn't wear CPAP  . Syncope 10/11   in settin gof asthma exacerbation w coughing. Ech (10/11): EF > 55%, mild LVH, grade I diastolic dysfunction, nomral RV size and systolic function,. normal valves. Carotid US (10/11): minimal disease    reports that he quit smoking about 23  years ago. His smoking use included cigarettes. He has a 8.75 pack-year smoking history. His smokeless tobacco use includes chew. He reports current alcohol use. He reports that he does not use drugs.   Current Outpatient Medications:  .  albuterol (VENTOLIN HFA) 108 (90 Base) MCG/ACT inhaler, Inhale 2 puffs into the lungs every 4 (four) hours as  needed for wheezing or shortness of breath., Disp: 1 Inhaler, Rfl: 10 .  allopurinol (ZYLOPRIM) 300 MG tablet, TAKE 1 TABLET BY MOUTH DAILY, Disp: 90 tablet, Rfl: 1 .  citalopram (CELEXA) 20 MG tablet, TAKE 1 TABLET BY MOUTH DAILY, Disp: 90 tablet, Rfl: 1 .  fluticasone (FLOVENT HFA) 220 MCG/ACT inhaler, Inhale 2 puffs into the lungs 2 (two) times daily., Disp: 1 Inhaler, Rfl: 5 .  lisinopril (ZESTRIL) 20 MG tablet, TAKE 1 TABLET BY MOUTH DAILY, Disp: 90 tablet, Rfl: 1 .  montelukast (SINGULAIR) 10 MG tablet, TAKE 1 TABLET BY MOUTH DAILY, Disp: 90 tablet, Rfl: 1 .  Multiple Vitamin (MULTIVITAMIN WITH MINERALS) TABS tablet, Take 1 tablet by mouth daily., Disp: 30 tablet, Rfl: 2 .  simvastatin (ZOCOR) 20 MG tablet, TAKE 1 TABLET BY MOUTH DAILY, Disp: 90 tablet, Rfl: 3 .  STIOLTO RESPIMAT 2.5-2.5 MCG/ACT AERS, Use two inhalations every day, Disp: 4 g, Rfl: 5 .  testosterone cypionate (DEPOTESTOSTERONE CYPIONATE) 200 MG/ML injection, Inject 0.3 mLs (60 mg total) into the muscle once a week., Disp: 6 mL, Rfl: 0   Observations/Objective: Blood pressure (!) 141/90, pulse 83, height 5\' 11"  (1.803 m), weight 248 lb (112.5 kg).  Physical Exam  Physical Exam Constitutional:      General: The patient is not in acute distress. Pulmonary:     Effort: Pulmonary effort is normal. No respiratory distress.  Neurological:     Mental Status: The patient is alert and oriented to person, place, and time.  Psychiatric:        Mood and Affect: Mood normal.        Behavior: Behavior normal.   Assessment and Plan Essential hypertension, benign Well controlled. Continue current medication.   Prediabetes Improved overall with stopping soda and sweet tea.  Hyperlipidemia LDL goal <70 At goal on simvastatin.  MDD (major depressive disorder), single episode, moderate (HCC) Moderate control.. increase cleexa to 40 mg daily.     I discussed the assessment and treatment plan with the patient. The patient was  provided an opportunity to ask questions and all were answered. The patient agreed with the plan and demonstrated an understanding of the instructions.   The patient was advised to call back or seek an in-person evaluation if the symptoms worsen or if the condition fails to improve as anticipated.     Eliezer Lofts, MD

## 2019-08-31 NOTE — Patient Instructions (Signed)
Work on healthy eating and  Increase exercsie as able.

## 2019-08-31 NOTE — Assessment & Plan Note (Signed)
Improved overall with stopping soda and sweet tea.

## 2019-08-31 NOTE — Assessment & Plan Note (Signed)
Well controlled. Continue current medication.  

## 2019-09-07 ENCOUNTER — Other Ambulatory Visit (INDEPENDENT_AMBULATORY_CARE_PROVIDER_SITE_OTHER): Payer: Medicare Other | Admitting: Vascular Surgery

## 2019-09-10 ENCOUNTER — Encounter (INDEPENDENT_AMBULATORY_CARE_PROVIDER_SITE_OTHER): Payer: Medicare Other

## 2019-09-14 ENCOUNTER — Other Ambulatory Visit (INDEPENDENT_AMBULATORY_CARE_PROVIDER_SITE_OTHER): Payer: Medicare Other | Admitting: Vascular Surgery

## 2019-09-19 DIAGNOSIS — Z0000 Encounter for general adult medical examination without abnormal findings: Secondary | ICD-10-CM | POA: Insufficient documentation

## 2019-09-19 DIAGNOSIS — Z87891 Personal history of nicotine dependence: Secondary | ICD-10-CM | POA: Insufficient documentation

## 2019-09-19 NOTE — Progress Notes (Signed)
Virtual Visit via Telephone Note  I connected with Cody Day on 09/20/19 at  9:00 AM EST by telephone and verified that I am speaking with the correct person using two identifiers.  Location: Patient: Home Provider: [facility] - [id number], [place], [place], [place] [address]   I discussed the limitations, risks, security and privacy concerns of performing an evaluation and management service by telephone and the availability of in person appointments. I also discussed with the patient that there may be a patient responsible charge related to this service. The patient expressed understanding and agreed to proceed.  Patient consented to consult via telephone: Yes People present and their role in pt care: Pt   @Patient  ID: Cody Day, male    DOB: 11/15/1955, 63 y.o.   MRN: [id number]  No chief complaint on file.   Referring provider: [name], MD  HPI:  63 year old male former smoker/current smokeless tobacco user followed in our office for COPD, asthma, allergic rhinitis, obstructive sleep apnea  PMH: Hyperlipidemia, gout, prediabetes, cardiomyopathy, history of alcohol abuse, anxiety, depression Smoker/ Smoking History: Former smoker. Quit 1997.  8.75 pack. Current smokeless tobacco user. 1 can lasts 4 days.   Maintenance:  Stiolto Respimat, Flovent HFA  Pt of: Dr. Mortimer Fries  09/20/2019  - Visit   63 year old male former smoker followed in our office for chronic asthma.  Patient also has nocturnal hypoxia currently managed on 2 L of O2 at night.  Patient reports he is already received his flu vaccine.  Patient reports he has documentation that needs to be completed to justify his oxygen use per his [place].  He does currently use smokeless tobacco 1 can of smokeless tobacco last about 4 days.  He believes that this is mainly driven by his anxiety.  Patient continues to use his Stiolto Respimat inhaler as well as his Flovent HFA.  He also has  uses rescue inhaler 5-6 times a day.  He reports he has been doing this for about 3 months.  He endorses wheezing as well as a morning cough with congestion he reports that the cough with congestion is at his baseline.  Patient feels more winded.  He also reports that he gets more short of breath when doing small activities in his house.  He is very limited physical mobility.  He currently lives in an 800 square foot trailer.  He does not walk often.  Questionaires / Pulmonary Flowsheets:   MMRC: mMRC Dyspnea Scale mMRC Score  09/20/2019 3    Tests:   01/10/2017-eosinophils relative 1, eosinophils absolute 0  07/20/2018-chest x-ray-lower lung volumes without acute chest process  03/23/2015-high-resolution CT chest-no findings of interstitial lung disease, no acute findings in the thorax, arthrosclerosis  07/21/2018-  echocardiogram-LV ejection fraction 123456, grade 1 diastolic dysfunction  99991111 3.4 (70% predicted), ratio 72, FEV1 2.5 (64% predicted)  03/23/2015-pulmonary function test-FVC 4.34 (87% predicted), postbronchodilator ratio 70, postbronchodilator FEV1 3.35 (88% predicted), positive bronchodilator response in FEV1, mid flow reversibility,, DLCO 41.19 (122% predicted)  12/22/2011-sleep study-AHI 79, SaO2 low 65%  FENO:  No results found for: NITRICOXIDE  PFT: PFT Results Latest Ref Rng & Units 03/23/2015  FVC-Pre L 4.34  FVC-Predicted Pre % 87  FVC-Post L 4.75  FVC-Predicted Post % 95  Pre FEV1/FVC % % 66  Post FEV1/FCV % % 70  FEV1-Pre L 2.88  FEV1-Predicted Pre % 75  FEV1-Post L 3.35  DLCO UNC% % 122  DLCO COR %  Predicted % 117  TLC L 7.00  TLC % Predicted % 97  RV % Predicted % 107    WALK:  SIX MIN WALK 10/02/2018 06/21/2016 01/25/2014  Medications Allopurinol, lisinopril, singulair, multi-votamin, Zocor Symbicort, Lisinopril, Primidone, Simivastin, Spiriva -  Supplimental Oxygen during Test? (L/min) No No No  Laps 10 6 -  Partial Lap (in Meters) 9 3 -   Baseline BP (sitting) 160/96 142/86 -  Baseline Heartrate 87 97 -  Baseline Dyspnea (Borg Scale) 3 7 -  Baseline Fatigue (Borg Scale) 2 7 -  Baseline SPO2 96 98 -  BP (sitting) 170/100 142/100 -  Heartrate 96 107 -  Dyspnea (Borg Scale) 4 10 -  Fatigue (Borg Scale) 0 10 -  SPO2 97 99 -  BP (sitting) 170/92 144/98 -  Heartrate 80 99 -  SPO2 98 98 -  Stopped or Paused before Six Minutes No No -  Distance Completed 349 291 -  Tech Comments: Patient walked at moderately slow pace without difficulty. Patient did not desat during walk.  Pt c/o chest tightness with the walk. -    Imaging: No results found.  Lab Results:  CBC    Component Value Date/Time   WBC 5.1 07/20/2018 2033   RBC 3.90 (L) 07/20/2018 2033   HGB 12.9 (L) 07/20/2018 2033   HGB 13.5 10/23/2012 1346   HCT 44.6 05/12/2019 0840   PLT 145 (L) 07/20/2018 2033   PLT 257 10/23/2012 1346   MCV 93.8 07/20/2018 2033   MCV 72 (L) 10/23/2012 1346   MCH 33.0 07/20/2018 2033   MCHC 35.2 07/20/2018 2033   RDW 16.0 (H) 07/20/2018 2033   RDW 17.2 (H) 10/23/2012 1346   LYMPHSABS 1.4 01/10/2017 0707   MONOABS 1.0 01/10/2017 0707   EOSABS 0.0 01/10/2017 0707   BASOSABS 0.1 01/10/2017 0707    BMET    Component Value Date/Time   NA 136 08/30/2019 0758   NA 136 10/23/2012 1346   K 4.1 08/30/2019 0758   K 3.4 (L) 10/23/2012 1346   CL 100 08/30/2019 0758   CL 99 10/23/2012 1346   CO2 30 08/30/2019 0758   CO2 23 10/23/2012 1346   GLUCOSE 103 (H) 08/30/2019 0758   GLUCOSE 112 (H) 10/23/2012 1346   BUN 9 08/30/2019 0758   BUN 13 10/23/2012 1346   CREATININE 0.79 08/30/2019 0758   CREATININE 1.07 10/23/2012 1346   CALCIUM 8.8 08/30/2019 0758   CALCIUM 9.3 10/23/2012 1346   GFRNONAA >60 07/22/2018 0318   GFRNONAA >60 10/23/2012 1346   GFRAA >60 07/22/2018 0318   GFRAA >60 10/23/2012 1346    BNP    Component Value Date/Time   BNP 43 10/23/2012 1346    ProBNP No results found for: PROBNP  Specialty Problems       Pulmonary Problems   Chronic asthma    February 2014 pulmonary function testing at [facility]> ratio normal, FEV1 3.63 L (95% predicted, 26% change with bronchodilator), total lung capacity 7.76 L (112% predicted), residual volume 4.38 L (198% predicted), DLCO 33.3 (108% predicted) 12/2013 RAST, HP profile negative 03/23/2015 pulmonary function testing prebronchodilator ratio 66%, FEV1 2.88 L, improved to 3.35 L (88% predicted, 16% change postbronchodilator), total lung capacity 7.0 L (97% predicted), DLCO 41.19 (122% predicted). 03/24/2015 high resolution CT chest: No pulmonary parenchymal findings noted, three-vessel coronary artery calcification noted 01/2016 serum eosinophils elevatd Simple spirometry 03/21/2016 no significant airflow obstruction, FEV1 2.47 L (64% predicted).      OSA (  obstructive sleep apnea)    NPSG 12/2011: AHI 79/hr Auto 2013: optimal pressure 14cm Download 02/2013:  Pressure 12.4, adequate control of apnea, still with some mask leaking.       Allergic rhinitis   Dyspnea    Nuclear Cardiac stress test 03/2015 with no evidence of ischemia but cardiomyopathy noted      COPD exacerbation (HCC)   Nocturnal hypoxia      Allergies  Allergen Reactions  . Levaquin [Levofloxacin In D5w]     Severe headaches, skin tingling/redness  . Penicillins Hives and Rash    Has patient had a PCN reaction causing immediate rash, facial/tongue/throat swelling, SOB or lightheadedness with hypotension: Yes Has patient had a PCN reaction causing severe rash involving mucus membranes or skin necrosis: Unknown Has patient had a PCN reaction that required hospitalization: No Has patient had a PCN reaction occurring within the last 10 years: No - Childhood reaction If all of the above answers are "NO", then may proceed with Cephalosporin use.     Immunization History  Administered Date(s) Administered  . Influenza Whole 11/21/2012  . Influenza,inj,Quad PF,6+ Mos  09/06/2014, 07/18/2015, 07/29/2016, 09/19/2017, 07/21/2018, 07/29/2019  . Pneumococcal Polysaccharide-23 10/23/2012  . Td 10/22/2003, 10/21/2009    Past Medical History:  Diagnosis Date  . Achilles tendon rupture 10/11   and repair  . Adenomatous polyp of colon 04/06/2010   Adenomatous polyps 3 in the descending colon, times one in the ascending colon [facility])  . Asthma   . Gout   . Hemorrhoids   . Hypercholesteremia   . Hypertension   . Sleep apnea    doesn't wear CPAP  . Syncope 10/11   in settin gof asthma exacerbation w coughing. Ech (10/11): EF > 55%, mild LVH, grade I diastolic dysfunction, nomral RV size and systolic function,. normal valves. Carotid US (10/11): minimal disease    Tobacco History: Social History   Tobacco Use  Smoking Status Former Smoker  . Packs/day: 0.25  . Years: 35.00  . Pack years: 8.75  . Types: Cigarettes  . Quit date: 10/22/1995  . Years since quitting: 23.9  Smokeless Tobacco Current User  . Types: Chew  Tobacco Comment   rarely smoked cigarettes and cigars.  states he smoked drugs mostly   Ready to quit: No Counseling given: Yes Comment: rarely smoked cigarettes and cigars.  states he smoked drugs mostly  Smoking assessment and cessation counseling  Patient currently smoking: not smoking  I have advised the patient to quit/stop smoking as soon as possible due to high risk for multiple medical problems.  It will also be very difficult for [place] to manage patient's  respiratory symptoms and status if we continue to expose her lungs to a known irritant.  We do not advise e-cigarettes as a form of stopping smoking.  Patient is not willing to quit smoking.  I have advised the patient that we can assist and have options of nicotine replacement therapy, provided smoking cessation education today, provided smoking cessation counseling, and provided cessation resources.  Follow-up next office visit office visit for assessment of smoking  cessation.    Smoking cessation counseling advised for: 5 min   Outpatient Encounter Medications as of 09/20/2019  Medication Sig  . albuterol (VENTOLIN HFA) 108 (90 Base) MCG/ACT inhaler Inhale 2 puffs into the lungs every 4 (four) hours as needed for wheezing or shortness of breath.  . allopurinol (ZYLOPRIM) 300 MG tablet TAKE 1 TABLET BY MOUTH DAILY  . citalopram (CELEXA) 40  MG tablet Take 1 tablet (40 mg total) by mouth daily.  . fluticasone (FLOVENT HFA) 220 MCG/ACT inhaler Inhale 2 puffs into the lungs 2 (two) times daily.  Marland Kitchen lisinopril (ZESTRIL) 20 MG tablet TAKE 1 TABLET BY MOUTH DAILY  . montelukast (SINGULAIR) 10 MG tablet TAKE 1 TABLET BY MOUTH DAILY  . Multiple Vitamin (MULTIVITAMIN WITH MINERALS) TABS tablet Take 1 tablet by mouth daily.  . predniSONE (DELTASONE) 10 MG tablet 4 tabs for 2 days, then 3 tabs for 2 days, 2 tabs for 2 days, then 1 tab for 2 days, then stop  . simvastatin (ZOCOR) 20 MG tablet TAKE 1 TABLET BY MOUTH DAILY  . STIOLTO RESPIMAT 2.5-2.5 MCG/ACT AERS Use two inhalations every day  . testosterone cypionate (DEPOTESTOSTERONE CYPIONATE) 200 MG/ML injection Inject 0.3 mLs (60 mg total) into the muscle once a week.   No facility-administered encounter medications on file as of 09/20/2019.      Review of Systems  Review of Systems  Constitutional: Positive for fatigue. Negative for activity change, chills, fever and unexpected weight change.  HENT: Negative for postnasal drip, rhinorrhea, sinus pressure, sinus pain and sore throat.   Eyes: Negative.   Respiratory: Positive for cough, shortness of breath and wheezing.   Cardiovascular: Negative for chest pain and palpitations.  Endocrine: Negative.   Genitourinary: Negative.   Musculoskeletal: Negative.   Skin: Negative.   Neurological: Positive for dizziness and weakness. Negative for headaches.  Psychiatric/Behavioral: Negative.  Negative for dysphoric mood. The patient is not nervous/anxious.    All other systems reviewed and are negative.     Assessment & Plan:   Chronic asthma Plan: Continue Stiolto Respimat Continue Flovent HFA Continue rescue inhaler Prednisone taper today Close follow-up in our office for an in person office evaluation Likely need to consider different medication uses as well as blood work at next office visit to check for peripheral eosinophilia  Repeat PFTs   Nocturnal hypoxia Plan: Continue 2 L of oxygen at night We will follow up with your company [employer] regarding the forms that you need completed by 09/21/2019  OSA (obstructive sleep apnea) Severe OSA Noncompliant due to mask fitting use Poor historian when it comes to who is managing last looks like this may have been neurology  Plan: Coordinate sleep MD eval in [place] [facility]  Former smoker Plan: Continue to not smoke  Healthcare maintenance Patient is already received his flu vaccine this year Patient needs to increase his overall physical activity  Generalized anxiety disorder Plan: Follow-up with primary care regarding anxiety management  Medication management Plan: We will consider trial of triple therapy inhaler at next office visit  Physical deconditioning Plan: Physical therapy order placed today Refer to pulmonary rehab  Smokeless tobacco use Plan: Emphasized need to stop smokeless tobacco use Patient declined cessation therapy today Patient follow-up with primary care regarding anxiety management as he feels that this is the main contributor to his smokeless tobacco use    Follow Up Instructions:  Return in about 2 weeks (around 10/04/2019), or if symptoms worsen or fail to improve, for Follow up with Wyn Quaker FNP-C, [facility] - Dr. Mortimer Fries.   I discussed the assessment and treatment plan with the patient. The patient was provided an opportunity to ask questions and all were answered. The patient agreed with the plan and demonstrated an  understanding of the instructions.   The patient was advised to call back or seek an in-person evaluation if the symptoms worsen or  if the condition fails to improve as anticipated.  I provided 40 minutes of non-face-to-face time during this encounter.   Lauraine Rinne, NP

## 2019-09-20 ENCOUNTER — Encounter: Payer: Self-pay | Admitting: Pulmonary Disease

## 2019-09-20 ENCOUNTER — Ambulatory Visit (INDEPENDENT_AMBULATORY_CARE_PROVIDER_SITE_OTHER): Payer: Medicare Other | Admitting: Pulmonary Disease

## 2019-09-20 ENCOUNTER — Other Ambulatory Visit: Payer: Self-pay

## 2019-09-20 DIAGNOSIS — G4734 Idiopathic sleep related nonobstructive alveolar hypoventilation: Secondary | ICD-10-CM

## 2019-09-20 DIAGNOSIS — F17210 Nicotine dependence, cigarettes, uncomplicated: Secondary | ICD-10-CM | POA: Diagnosis not present

## 2019-09-20 DIAGNOSIS — R5381 Other malaise: Secondary | ICD-10-CM | POA: Diagnosis not present

## 2019-09-20 DIAGNOSIS — Z79899 Other long term (current) drug therapy: Secondary | ICD-10-CM | POA: Diagnosis not present

## 2019-09-20 DIAGNOSIS — G4733 Obstructive sleep apnea (adult) (pediatric): Secondary | ICD-10-CM

## 2019-09-20 DIAGNOSIS — J4550 Severe persistent asthma, uncomplicated: Secondary | ICD-10-CM | POA: Diagnosis not present

## 2019-09-20 DIAGNOSIS — Z720 Tobacco use: Secondary | ICD-10-CM | POA: Insufficient documentation

## 2019-09-20 DIAGNOSIS — Z87891 Personal history of nicotine dependence: Secondary | ICD-10-CM

## 2019-09-20 DIAGNOSIS — F411 Generalized anxiety disorder: Secondary | ICD-10-CM

## 2019-09-20 DIAGNOSIS — Z0000 Encounter for general adult medical examination without abnormal findings: Secondary | ICD-10-CM

## 2019-09-20 MED ORDER — PREDNISONE 10 MG PO TABS: ORAL_TABLET | ORAL | 0 refills | Status: DC

## 2019-09-20 NOTE — Assessment & Plan Note (Signed)
Patient is already received his flu vaccine this year Patient needs to increase his overall physical activity

## 2019-09-20 NOTE — Patient Instructions (Addendum)
You were seen today by Lauraine Rinne, NP  For:   1. Severe persistent chronic asthma without complication  - predniSONE (DELTASONE) 10 MG tablet; 4 tabs for 2 days, then 3 tabs for 2 days, 2 tabs for 2 days, then 1 tab for 2 days, then stop  Dispense: 20 tablet; Refill: 0  Stiolto Respimat inhaler >>>2 puffs daily >>>Take this no matter what >>>This is not a rescue inhaler  Continue Flovent HFA  >>> 2 puffs in the morning right when you wake up, rinse out your mouth after use, 12 hours later 2 puffs, rinse after use >>> Take this daily, no matter what >>> This is not a rescue inhaler   Only use your albuterol as a rescue medication to be used if you can't catch your breath by resting or doing a relaxed purse lip breathing pattern.  - The less you use it, the better it will work when you need it. - Ok to use up to 2 puffs  every 4-6 hours if you must but call for immediate appointment if use goes up over your usual need - Don't leave home without it !!  (think of it like the spare tire for your car)   Likely will need to consider switching your inhaler at next office visit  2. OSA (obstructive sleep apnea)  We will get you scheduled with one of our sleep clinic MDs to speak about how to best treat your severe obstructive sleep apnea  3. Nocturnal hypoxia  Continue oxygen therapy as prescribed  >>>maintain oxygen saturations greater than 88 percent  >>>if unable to maintain oxygen saturations please contact the office  >>>do not smoke with oxygen  >>>can use nasal saline gel or nasal saline rinses to moisturize nose if oxygen causes dryness   4. Healthcare maintenance  Great job already receiving your flu vaccine  5. Former smoker    We recommend that you stop chewing tobacco >>>You need to set a quit date >>>If you have friends or family who smoke, let them know you are trying to quit and not to smoke around you or in your living environment  Smoking Cessation Resources:   1 800 QUIT NOW  >>> Patient to call this resource and utilize it to help support her quit smoking >>> Keep up your hard work with stopping smoking  You can also contact the [facility] >>>For smoking cessation classes call [phone]  We do not recommend using e-cigarettes as a form of stopping smoking  You can sign up for smoking cessation support texts and information:  >>>https://smokefree.gov/smokefreetxt    6. Medication management  Likely we will change her inhaler at next office visit   We recommend today:  No orders of the defined types were placed in this encounter.  No orders of the defined types were placed in this encounter.  Meds ordered this encounter  Medications  . predniSONE (DELTASONE) 10 MG tablet    Sig: 4 tabs for 2 days, then 3 tabs for 2 days, 2 tabs for 2 days, then 1 tab for 2 days, then stop    Dispense:  20 tablet    Refill:  0    Follow Up:    No follow-ups on file.   Please do your part to reduce the spread of COVID-19:      Reduce your risk of any infection  and COVID19 by using the similar precautions used for avoiding the common cold or flu:  .  Wash your hands often with soap and warm water for at least 20 seconds.  If soap and water are not readily available, use an alcohol-based hand sanitizer with at least 60% alcohol.  . If coughing or sneezing, cover your mouth and nose by coughing or sneezing into the elbow areas of your shirt or coat, into a tissue or into your sleeve (not your hands). Langley Gauss A MASK when in public  . Avoid shaking hands with others and consider head nods or verbal greetings only. . Avoid touching your eyes, nose, or mouth with unwashed hands.  . Avoid close contact with people who are sick. . Avoid places or events with large numbers of people in one location, like concerts or sporting events. . If you have some symptoms but not all symptoms, continue to monitor at home and seek medical  attention if your symptoms worsen. . If you are having a medical emergency, call 911.   [place] / e-Visit: [email or web address]         [facility]: [facility]: [phone]                   [facility]: [phone]     It is flu season:   >>> Best ways to protect herself from the flu: Receive the yearly flu vaccine, practice good hand hygiene washing with soap and also using hand sanitizer when available, eat a nutritious meals, get adequate rest, hydrate appropriately   Please contact the office if your symptoms worsen or you have concerns that you are not improving.   Thank you for choosing [facility] for your healthcare, and for allowing [place] to partner with you on your healthcare journey. I am thankful to be able to provide care to you today.   Wyn Quaker FNP-C   Exercises To Do While Sitting  Exercises that you do while sitting (chair exercises) can give you many of the same benefits as full exercise. Benefits include strengthening your heart, burning calories, and keeping muscles and joints healthy. Exercise can also improve your mood and help with depression and anxiety. You may benefit from chair exercises if you are unable to do standing exercises because of:  Diabetic foot pain.  Obesity.  Illness.  Arthritis.  Recovery from surgery or injury.  Breathing problems.  Balance problems.  Another type of disability. Before starting chair exercises, check with your health care provider or a physical therapist to find out how much exercise you can tolerate and which exercises are safe for you. If your health care provider approves:  Start out slowly and build up over time. Aim to work up to about 10-20 minutes for each exercise session.  Make exercise part of your daily routine.  Drink water when  you exercise. Do not wait until you are thirsty. Drink every 10-15 minutes.  Stop exercising right away if you have pain, nausea, shortness of breath, or dizziness.  If you are exercising in a wheelchair, make sure to lock the wheels.  Ask your health care provider whether you can do tai chi or yoga. Many positions in these mind-body exercises can be modified to do while seated. Warm-up Before starting other exercises: 1. Sit up as straight as you can. Have your knees bent at 90 degrees, which is the shape of the capital letter "L." Keep your feet flat on the floor. 2. Sit at the front  edge of your chair, if you can. 3. Pull in (tighten) the muscles in your abdomen and stretch your spine and neck as straight as you can. Hold this position for a few minutes. 4. Breathe in and out evenly. Try to concentrate on your breathing, and relax your mind. Stretching Exercise A: Arm stretch 1. Hold your arms out straight in front of your body. 2. Bend your hands at the wrist with your fingers pointing up, as if signaling someone to stop. Notice the slight tension in your forearms as you hold the position. 3. Keeping your arms out and your hands bent, rotate your hands outward as far as you can and hold this stretch. Aim to have your thumbs pointing up and your pinkie fingers pointing down. Slowly repeat arm stretches for one minute as tolerated. Exercise B: Leg stretch 1. If you can move your legs, try to "draw" letters on the floor with the toes of your foot. Write your name with one foot. 2. Write your name with the toes of your other foot. Slowly repeat the movements for one minute as tolerated. Exercise C: Reach for the sky 1. Reach your hands as far over your head as you can to stretch your spine. 2. Move your hands and arms as if you are climbing a rope. Slowly repeat the movements for one minute as tolerated. Range of motion exercises Exercise A: Shoulder roll 1. Let your arms hang loosely at  your sides. 2. Lift just your shoulders up toward your ears, then let them relax back down. 3. When your shoulders feel loose, rotate your shoulders in backward and forward circles. Do shoulder rolls slowly for one minute as tolerated. Exercise B: March in place 1. As if you are marching, pump your arms and lift your legs up and down. Lift your knees as high as you can. ? If you are unable to lift your knees, just pump your arms and move your ankles and feet up and down. March in place for one minute as tolerated. Exercise C: Seated jumping jacks 1. Let your arms hang down straight. 2. Keeping your arms straight, lift them up over your head. Aim to point your fingers to the ceiling. 3. While you lift your arms, straighten your legs and slide your heels along the floor to your sides, as wide as you can. 4. As you bring your arms back down to your sides, slide your legs back together. ? If you are unable to use your legs, just move your arms. Slowly repeat seated jumping jacks for one minute as tolerated. Strengthening exercises Exercise A: Shoulder squeeze 1. Hold your arms straight out from your body to your sides, with your elbows bent and your fists pointed at the ceiling. 2. Keeping your arms in the bent position, move them forward so your elbows and forearms meet in front of your face. 3. Open your arms back out as wide as you can with your elbows still bent, until you feel your shoulder blades squeezing together. Hold for 5 seconds. Slowly repeat the movements forward and backward for one minute as tolerated. Contact a health care provider if you:  Had to stop exercising due to any of the following: ? Pain. ? Nausea. ? Shortness of breath. ? Dizziness. ? Fatigue.  Have significant pain or soreness after exercising. Get help right away if you have:  Chest pain.  Difficulty breathing. These symptoms may represent a serious problem that is an emergency. Do not wait to  see if the  symptoms will go away. Get medical help right away. Call your local emergency services (911 in the [place] Do not drive yourself to the hospital. This information is not intended to replace advice given to you by your health care provider. Make sure you discuss any questions you have with your health care provider. Document Released: 08/20/2017 Document Revised: 01/28/2019 Document Reviewed: 08/20/2017 Elsevier Patient Education  [place] Exercise  The sit-to-stand exercise (also known as the chair stand or chair rise exercise) strengthens your lower body and helps you maintain or improve your mobility and independence. The goal is to do the sit-to-stand exercise without using your hands. This will be easier as you become stronger. You should always talk with your health care provider before starting any exercise program, especially if you have had recent surgery. Do the exercise exactly as told by your health care provider and adjust it as directed. It is normal to feel mild stretching, pulling, tightness, or discomfort as you do this exercise, but you should stop right away if you feel sudden pain or your pain gets worse. Do not begin doing this exercise until told by your health care provider. What the sit-to-stand exercise does The sit-to-stand exercise helps to strengthen the muscles in your thighs and the muscles in the center of your body that give you stability (core muscles). This exercise is especially helpful if:  You have had knee or hip surgery.  You have trouble getting up from a chair, out of a car, or off the toilet. How to do the sit-to-stand exercise 1. Sit toward the front edge of a sturdy chair without armrests. Your knees should be bent and your feet should be flat on the floor and shoulder-width apart. 2. Place your hands lightly on each side of the seat. Keep your back and neck as straight as possible, with your chest slightly forward. 3. Breathe  in slowly. Lean forward and slightly shift your weight to the front of your feet. 4. Breathe out as you slowly stand up. Use your hands as little as possible. 5. Stand and pause for a full breath in and out. 6. Breathe in as you sit down slowly. Tighten your core and abdominal muscles to control your lowering as much as possible. 7. Breathe out slowly. 8. Do this exercise 10-15 times. If needed, do it fewer times until you build up strength. 9. Rest for 1 minute, then do another set of 10-15 repetitions. To change the difficulty of the sit-to-stand exercise  If the exercise is too difficult, use a chair with sturdy armrests, and push off the armrests to help you come to the standing position. You can also use the armrests to help slowly lower yourself back to sitting. As this gets easier, try to use your arms less. You can also place a firm cushion or pillow on the chair to make the surface higher.  If this exercise is too easy, do not use your arms to help raise or lower yourself. You can also wear a weighted vest, use hand weights, increase your repetitions, or try a lower chair. General tips  You may feel tired when starting an exercise routine. This is normal.  You may have muscle soreness that lasts a few days. This is normal. As you get stronger, you may not feel muscle soreness.  Use smooth, steady movements.  Do not  hold your breath during strength exercises. This can cause unsafe changes  in your blood pressure.  Breathe in slowly through your nose, and breathe out slowly through your mouth. Summary  Strengthening your lower body is an important step to help you move safely and independently.  The sit-to-stand exercise helps strengthen the muscles in your thighs and core.  You should always talk with your health care provider before starting any exercise program, especially if you have had recent surgery. This information is not intended to replace advice given to you by your  health care provider. Make sure you discuss any questions you have with your health care provider. Document Released: 11/28/2016 Document Revised: 08/05/2018 Document Reviewed: 11/28/2016 Elsevier Patient Education  2020 [employer].

## 2019-09-20 NOTE — Assessment & Plan Note (Signed)
Plan: Continue 2 L of oxygen at night We will follow up with your [facility] regarding the forms that you need completed by 09/21/2019

## 2019-09-20 NOTE — Assessment & Plan Note (Signed)
Severe OSA Noncompliant due to mask fitting use Poor historian when it comes to who is managing last looks like this may have been neurology  Plan: Coordinate sleep MD eval in [place] [facility]

## 2019-09-20 NOTE — Progress Notes (Signed)
Reviewed and agree with assessment/plan.   Chessie Neuharth, MD [facility] 10/16/2016, 12:24 PM Pager:  [phone]  

## 2019-09-20 NOTE — Assessment & Plan Note (Signed)
Plan: Follow-up with primary care regarding anxiety management

## 2019-09-20 NOTE — Assessment & Plan Note (Addendum)
Plan: Continue Stiolto Respimat Continue Flovent HFA Continue rescue inhaler Prednisone taper today Close follow-up in our office for an in person office evaluation Likely need to consider different medication uses as well as blood work at next office visit to check for peripheral eosinophilia  Repeat PFTs

## 2019-09-20 NOTE — Assessment & Plan Note (Signed)
Plan: Emphasized need to stop smokeless tobacco use Patient declined cessation therapy today Patient follow-up with primary care regarding anxiety management as he feels that this is the main contributor to his smokeless tobacco use

## 2019-09-20 NOTE — Addendum Note (Signed)
Addended by: Maryanna Shape A on: 09/20/2019 10:21 AM   Modules accepted: Orders

## 2019-09-20 NOTE — Assessment & Plan Note (Signed)
Plan: Continue to not smoke 

## 2019-09-20 NOTE — Assessment & Plan Note (Signed)
Plan: We will consider trial of triple therapy inhaler at next office visit

## 2019-09-20 NOTE — Assessment & Plan Note (Addendum)
Plan: Refer to pulmonary rehab Consider PT at next ov

## 2019-09-21 ENCOUNTER — Other Ambulatory Visit: Payer: Self-pay | Admitting: *Deleted

## 2019-09-21 ENCOUNTER — Other Ambulatory Visit (INDEPENDENT_AMBULATORY_CARE_PROVIDER_SITE_OTHER): Payer: Medicare Other | Admitting: Vascular Surgery

## 2019-09-21 DIAGNOSIS — J4550 Severe persistent asthma, uncomplicated: Secondary | ICD-10-CM

## 2019-09-22 ENCOUNTER — Other Ambulatory Visit: Payer: Self-pay

## 2019-09-22 ENCOUNTER — Other Ambulatory Visit: Payer: Medicare Other

## 2019-09-22 DIAGNOSIS — E291 Testicular hypofunction: Secondary | ICD-10-CM | POA: Diagnosis not present

## 2019-09-23 LAB — TESTOSTERONE: Testosterone: 840 ng/dL (ref 264–916)

## 2019-09-24 ENCOUNTER — Encounter (INDEPENDENT_AMBULATORY_CARE_PROVIDER_SITE_OTHER): Payer: Medicare Other

## 2019-09-24 ENCOUNTER — Other Ambulatory Visit: Payer: Medicare Other

## 2019-09-24 ENCOUNTER — Telehealth: Payer: Self-pay

## 2019-09-24 ENCOUNTER — Other Ambulatory Visit: Payer: Self-pay | Admitting: Internal Medicine

## 2019-09-24 DIAGNOSIS — J449 Chronic obstructive pulmonary disease, unspecified: Secondary | ICD-10-CM

## 2019-09-24 NOTE — Telephone Encounter (Signed)
Called pt informed him of the information below, pt voiced understanding. He states that he has decreased to 0.3 as you requested. He states his blood draw was about 5 days prior to last injection.

## 2019-09-24 NOTE — Telephone Encounter (Signed)
Continue present dose and follow-up as scheduled

## 2019-09-24 NOTE — Telephone Encounter (Signed)
-----   Message from Abbie Sons, MD sent at 09/24/2019  7:53 AM EST ----- Testosterone level was 840.  Did he decrease the dose to 0.3 weekly?

## 2019-09-30 NOTE — Progress Notes (Signed)
@Patient  ID: Cody Day, male    DOB: 05-01-1956, 63 y.o.   MRN: [id number]  Chief Complaint  Patient presents with   Follow-up    pt reports of sob with exertion, dry cough at times prod with white mucus mainly in the morning and wheezing.     Referring provider: [name], MD  HPI:  64 year old male former smoker/current smokeless tobacco user followed in our office for COPD, asthma, allergic rhinitis, obstructive sleep apnea  PMH: Hyperlipidemia, gout, prediabetes, cardiomyopathy, history of alcohol abuse, anxiety, depression Smoker/ Smoking History: Former smoker. Quit 1997.  8.75 pack. Current smokeless tobacco user. 1 can lasts 4 days.   Maintenance:  Stiolto Respimat, Flovent HFA  Pt of: Dr. Mortimer Fries  10/01/2019  - Visit   63 year old male former smoker presenting to our office today as a 1 week follow-up.  He completed a telephonic visit with our office last week for evaluation of dyspnea and shortness of breath.  He was treated empirically with a course of steroids and encouraged to have close follow-up with our office.  Patient presenting to our office today for further evaluation.  On arrival we tried to obtain a baseline walk test.  Patient could not tolerate a walk in office barely to complete half a lap.  Heart rate was in the 170s patient was short of breath and dizzy.  Patient reports that he gets short of breath and dizzy and lightheaded with small amounts of physical activity.  Heart rate ranged between 140 and 170s.  Patient reports no new medications.  He denies missing any of his medications.  Patient is visibly winded today.  He reports adherence to his inhaler Stiolto Respimat and Flovent HFA.  Patient is noncompliant with CPAP use and has severe obstructive sleep apnea.  He is scheduled for repeat pulmonary function testing as well as follow-ups to establish care with a new pulmonologist in our office as well as with the sleep MD when they are in the  [place] clinic.  Questionaires / Pulmonary Flowsheets:   MMRC: mMRC Dyspnea Scale mMRC Score  09/20/2019 3   Tests:   01/10/2017-eosinophils relative 1, eosinophils absolute 0  07/20/2018-chest x-ray-lower lung volumes without acute chest process  03/23/2015-high-resolution CT chest-no findings of interstitial lung disease, no acute findings in the thorax, arthrosclerosis  07/21/2018-  echocardiogram-LV ejection fraction [id number], grade 1 diastolic dysfunction  99991111 3.4 (70% predicted), ratio 72, FEV1 2.5 (64% predicted)  03/23/2015-pulmonary function test-FVC 4.34 (87% predicted), postbronchodilator ratio 70, postbronchodilator FEV1 3.35 (88% predicted), positive bronchodilator response in FEV1, mid flow reversibility,, DLCO 41.19 (122% predicted)  12/22/2011-sleep study-AHI 79, SaO2 low 65%  FENO:  No results found for: NITRICOXIDE  PFT: PFT Results Latest Ref Rng & Units 03/23/2015  FVC-Pre L 4.34  FVC-Predicted Pre % 87  FVC-Post L 4.75  FVC-Predicted Post % 95  Pre FEV1/FVC % % 66  Post FEV1/FCV % % 70  FEV1-Pre L 2.88  FEV1-Predicted Pre % 75  FEV1-Post L 3.35  DLCO UNC% % 122  DLCO COR %Predicted % 117  TLC L 7.00  TLC % Predicted % 97  RV % Predicted % 107    WALK:  SIX MIN WALK 10/01/2019 10/02/2018 06/21/2016 01/25/2014  Medications - Allopurinol, lisinopril, singulair, multi-votamin, Zocor Symbicort, Lisinopril, Primidone, Simivastin, Spiriva -  Supplimental Oxygen during Test? (L/min) - No No No  Laps - 10 6 -  Partial Lap (in Meters) - 9 3 -  Baseline BP (sitting) - 160/96 142/86 -  Baseline Heartrate - 87 97 -  Baseline Dyspnea (Borg Scale) - 3 7 -  Baseline Fatigue (Borg Scale) - 2 7 -  Baseline SPO2 - 96 98 -  BP (sitting) - 170/100 142/100 -  Heartrate - 96 107 -  Dyspnea (Borg Scale) - 4 10 -  Fatigue (Borg Scale) - 0 10 -  SPO2 - 97 99 -  BP (sitting) - 170/92 144/98 -  Heartrate - 80 99 -  SPO2 - 98 98 -  Stopped or Paused before  Six Minutes - No No -  Distance Completed - 349 291 -  Tech Comments: moderate pace c/o sob. test stopped due to HR. Patient walked at moderately slow pace without difficulty. Patient did not desat during walk.  Pt c/o chest tightness with the walk. -    Imaging: No results found.  Lab Results:  CBC    Component Value Date/Time   WBC 5.1 07/20/2018 2033   RBC 3.90 (L) 07/20/2018 2033   HGB 12.9 (L) 07/20/2018 2033   HGB 13.5 10/23/2012 1346   HCT 44.6 05/12/2019 0840   PLT 145 (L) 07/20/2018 2033   PLT 257 10/23/2012 1346   MCV 93.8 07/20/2018 2033   MCV 72 (L) 10/23/2012 1346   MCH 33.0 07/20/2018 2033   MCHC 35.2 07/20/2018 2033   RDW 16.0 (H) 07/20/2018 2033   RDW 17.2 (H) 10/23/2012 1346   LYMPHSABS 1.4 01/10/2017 0707   MONOABS 1.0 01/10/2017 0707   EOSABS 0.0 01/10/2017 0707   BASOSABS 0.1 01/10/2017 0707    BMET    Component Value Date/Time   NA 136 08/30/2019 0758   NA 136 10/23/2012 1346   K 4.1 08/30/2019 0758   K 3.4 (L) 10/23/2012 1346   CL 100 08/30/2019 0758   CL 99 10/23/2012 1346   CO2 30 08/30/2019 0758   CO2 23 10/23/2012 1346   GLUCOSE 103 (H) 08/30/2019 0758   GLUCOSE 112 (H) 10/23/2012 1346   BUN 9 08/30/2019 0758   BUN 13 10/23/2012 1346   CREATININE 0.79 08/30/2019 0758   CREATININE 1.07 10/23/2012 1346   CALCIUM 8.8 08/30/2019 0758   CALCIUM 9.3 10/23/2012 1346   GFRNONAA >60 07/22/2018 0318   GFRNONAA >60 10/23/2012 1346   GFRAA >60 07/22/2018 0318   GFRAA >60 10/23/2012 1346    BNP    Component Value Date/Time   BNP 43 10/23/2012 1346    ProBNP No results found for: PROBNP  Specialty Problems      Pulmonary Problems   Chronic asthma    February 2014 pulmonary function testing at [facility]> ratio normal, FEV1 3.63 L (95% predicted, 26% change with bronchodilator), total lung capacity 7.76 L (112% predicted), residual volume 4.38 L (198% predicted), DLCO 33.3 (108% predicted) 12/2013 RAST, HP profile  negative 03/23/2015 pulmonary function testing prebronchodilator ratio 66%, FEV1 2.88 L, improved to 3.35 L (88% predicted, 16% change postbronchodilator), total lung capacity 7.0 L (97% predicted), DLCO 41.19 (122% predicted). 03/24/2015 high resolution CT chest: No pulmonary parenchymal findings noted, three-vessel coronary artery calcification noted 01/2016 serum eosinophils elevatd Simple spirometry 03/21/2016 no significant airflow obstruction, FEV1 2.47 L (64% predicted).      OSA (obstructive sleep apnea)    NPSG 12/2011: AHI 79/hr Auto 2013: optimal pressure 14cm Download 02/2013:  Pressure 12.4, adequate control of apnea, still with some mask leaking.       Allergic rhinitis   Dyspnea    Nuclear Cardiac stress test 03/2015 with no  evidence of ischemia but cardiomyopathy noted      COPD exacerbation (HCC)   Nocturnal hypoxia      Allergies  Allergen Reactions   Levaquin [Levofloxacin In D5w]     Severe headaches, skin tingling/redness   Penicillins Hives and Rash    Has patient had a PCN reaction causing immediate rash, facial/tongue/throat swelling, SOB or lightheadedness with hypotension: Yes Has patient had a PCN reaction causing severe rash involving mucus membranes or skin necrosis: Unknown Has patient had a PCN reaction that required hospitalization: No Has patient had a PCN reaction occurring within the last 10 years: No - Childhood reaction If all of the above answers are "NO", then may proceed with Cephalosporin use.     Immunization History  Administered Date(s) Administered   Influenza Whole 11/21/2012   Influenza,inj,Quad PF,6+ Mos 09/06/2014, 07/18/2015, 07/29/2016, 09/19/2017, 07/21/2018, 07/29/2019   Pneumococcal Polysaccharide-23 10/23/2012   Td 10/22/2003, 10/21/2009    Past Medical History:  Diagnosis Date   Achilles tendon rupture 10/11   and repair   Adenomatous polyp of colon 04/06/2010   Adenomatous polyps 3 in the descending colon,  times one in the ascending colon [facility]   Asthma    Gout    Hemorrhoids    Hypercholesteremia    Hypertension    Sleep apnea    doesn't wear CPAP   Syncope 10/11   in settin gof asthma exacerbation w coughing. Ech (10/11): EF > 55%, mild LVH, grade I diastolic dysfunction, nomral RV size and systolic function,. normal valves. Carotid US (10/11): minimal disease    Tobacco History: Social History   Tobacco Use  Smoking Status Former Smoker   Packs/day: 0.25   Years: 35.00   Pack years: 8.75   Types: Cigarettes   Quit date: 10/22/1995   Years since quitting: 23.9  Smokeless Tobacco Current User   Types: Chew  Tobacco Comment   rarely smoked cigarettes and cigars.  states he smoked drugs mostly   Ready to quit: No Counseling given: Yes Comment: rarely smoked cigarettes and cigars.  states he smoked drugs mostly   Continue to not smoke  Outpatient Encounter Medications as of 10/01/2019  Medication Sig   albuterol (VENTOLIN HFA) 108 (90 Base) MCG/ACT inhaler Inhale 2 puffs into the lungs every 4 (four) hours as needed for wheezing or shortness of breath.   allopurinol (ZYLOPRIM) 300 MG tablet TAKE 1 TABLET BY MOUTH DAILY   citalopram (CELEXA) 40 MG tablet Take 1 tablet (40 mg total) by mouth daily.   fluticasone (FLOVENT HFA) 220 MCG/ACT inhaler Inhale 2 puffs into the lungs 2 (two) times daily.   lisinopril (ZESTRIL) 20 MG tablet TAKE 1 TABLET BY MOUTH DAILY   montelukast (SINGULAIR) 10 MG tablet TAKE 1 TABLET BY MOUTH DAILY   Multiple Vitamin (MULTIVITAMIN WITH MINERALS) TABS tablet Take 1 tablet by mouth daily.   simvastatin (ZOCOR) 20 MG tablet TAKE 1 TABLET BY MOUTH DAILY   STIOLTO RESPIMAT 2.5-2.5 MCG/ACT AERS Use two inhalations every day   testosterone cypionate (DEPOTESTOSTERONE CYPIONATE) 200 MG/ML injection Inject 0.3 mLs (60 mg total) into the muscle once a week.   [DISCONTINUED] predniSONE (DELTASONE) 10 MG tablet 4 tabs for 2  days, then 3 tabs for 2 days, 2 tabs for 2 days, then 1 tab for 2 days, then stop   No facility-administered encounter medications on file as of 10/01/2019.     Review of Systems  Review of Systems  Constitutional: Positive for fatigue. Negative for activity  change, chills, fever and unexpected weight change.  HENT: Negative for rhinorrhea, sinus pressure, sinus pain and sore throat.   Eyes: Negative.   Respiratory: Positive for shortness of breath. Negative for cough and wheezing.   Cardiovascular: Positive for chest pain and palpitations. Negative for leg swelling.  Gastrointestinal: Negative for diarrhea, nausea and vomiting.  Endocrine: Negative.   Genitourinary: Negative.   Musculoskeletal: Negative.   Skin: Negative.   Neurological: Positive for dizziness and light-headedness. Negative for headaches.  Psychiatric/Behavioral: Negative.  Negative for dysphoric mood. The patient is not nervous/anxious.   All other systems reviewed and are negative.    Physical Exam  BP (!) 142/92 (BP Location: Left Arm, Cuff Size: Normal)    Pulse 85    Temp 98 F (36.7 C) (Temporal)    Ht 5\' 11"  (1.803 m)    Wt 245 lb 6.4 oz (111.3 kg)    SpO2 97%    BMI 34.23 kg/m   Wt Readings from Last 5 Encounters:  10/01/19 245 lb 6.4 oz (111.3 kg)  08/31/19 248 lb (112.5 kg)  08/23/19 248 lb (112.5 kg)  08/04/19 248 lb (112.5 kg)  06/18/19 243 lb (110.2 kg)    BMI Readings from Last 5 Encounters:  10/01/19 34.23 kg/m  08/31/19 34.59 kg/m  08/23/19 34.59 kg/m  08/04/19 34.59 kg/m  06/18/19 33.89 kg/m     Physical Exam Vitals and nursing note reviewed.  Constitutional:      General: He is in acute distress.     Appearance: He is obese. He is ill-appearing and diaphoretic.     Comments: Frail adult male, visibly in distress status post short walk in our office  HENT:     Head: Normocephalic and atraumatic.     Right Ear: Hearing, tympanic membrane, ear canal and external ear normal.      Left Ear: Hearing, tympanic membrane, ear canal and external ear normal.     Nose: Nose normal. No mucosal edema or rhinorrhea.     Right Turbinates: Not enlarged.     Left Turbinates: Not enlarged.     Mouth/Throat:     Mouth: Mucous membranes are dry.     Pharynx: Oropharynx is clear. No oropharyngeal exudate.  Eyes:     Pupils: Pupils are equal, round, and reactive to light.  Cardiovascular:     Rate and Rhythm: Normal rate and regular rhythm. Occasional extrasystoles are present.    Pulses: Normal pulses.     Heart sounds: Normal heart sounds. No murmur.  Pulmonary:     Effort: Pulmonary effort is normal.     Breath sounds: Wheezing (Expiratory wheezes) present. No decreased breath sounds or rales.  Abdominal:     General: Bowel sounds are normal. There is no distension.     Palpations: Abdomen is soft.     Tenderness: There is no abdominal tenderness.  Musculoskeletal:     Cervical back: Normal range of motion.     Right lower leg: 1+ Pitting Edema present.     Left lower leg: 1+ Pitting Edema present.  Lymphadenopathy:     Cervical: No cervical adenopathy.  Skin:    General: Skin is warm.     Capillary Refill: Capillary refill takes less than 2 seconds.     Findings: No erythema or rash.  Neurological:     General: No focal deficit present.     Mental Status: He is alert and oriented to person, place, and time.     Motor: No  weakness.     Coordination: Coordination normal.     Gait: Gait is intact. Gait normal.  Psychiatric:        Mood and Affect: Mood is anxious.        Behavior: Behavior normal. Behavior is cooperative.        Thought Content: Thought content normal.        Judgment: Judgment normal.       Assessment & Plan:   Chronic asthma Plan: Continue Stiolto Respimat Continue Flovent HFA Continue rescue inhaler Likely need to consider different medication uses as well as blood work at next office visit to check for peripheral  eosinophilia  Repeat PFTs   OSA (obstructive sleep apnea) Severe OSA Noncompliant due to mask fitting use Poor historian when it comes to who is managing last looks like this may have been neurology  Plan: Coordinate sleep MD eval in [place] [facility]  Dizziness EKG stable today compared to 2019  Patient continues to have persistent dizziness, lightheadedness and exertional chest pain  Plan: Patient will be transported to [facility] regional emergency room for further evaluation  Dyspnea Plan: Patient will be routed to [facility] regional emergency room via wheelchair from our clinical staff  Exertional chest pain Patient continues to have ongoing exertional chest pain after ambulation our office EKG shows prolonged QT normal sinus rhythm On exam sounded like patient was having PVCs EKG is comparable to 2019 EKG on file  Plan: Patient needs further evaluation in acute care setting We are routing the patient to [facility] regional emergency room for blood work, chest imaging and further evaluation  Physical deconditioning Plan: Continue forward with plan pulmonary rehab  Smokeless tobacco use Plan: We recommend that you stop using smokeless tobacco  Alcohol abuse, in remission Significant history of alcohol abuse  Plan: We continue to recommend that you do not abuse alcohol  I have personally contacted the patient's spouse Ezrael Leischner ([phone]) we have notified her that the patient has been routed to the emergency room.  She is aware.   Return in about 2 weeks (around 10/15/2019), or if symptoms worsen or fail to improve, for [employer] - MD or APP .   Lauraine Rinne, NP 10/01/2019   This appointment was 42 minutes long with over 50% of the time in direct face-to-face patient care, assessment, plan of care, and follow-up.

## 2019-10-01 ENCOUNTER — Telehealth: Payer: Self-pay | Admitting: Pulmonary Disease

## 2019-10-01 ENCOUNTER — Emergency Department
Admission: EM | Admit: 2019-10-01 | Discharge: 2019-10-01 | Disposition: A | Discharge status: Home / Self Care | Payer: Medicare Other | Attending: Emergency Medicine | Admitting: Emergency Medicine

## 2019-10-01 ENCOUNTER — Ambulatory Visit (INDEPENDENT_AMBULATORY_CARE_PROVIDER_SITE_OTHER): Payer: Medicare Other | Admitting: Pulmonary Disease

## 2019-10-01 ENCOUNTER — Ambulatory Visit
Admission: RE | Admit: 2019-10-01 | Discharge: 2019-10-01 | Disposition: A | Discharge status: Home / Self Care | Payer: Medicare Other | Source: Ambulatory Visit | Attending: Pulmonary Disease | Admitting: Pulmonary Disease

## 2019-10-01 ENCOUNTER — Other Ambulatory Visit: Payer: Self-pay

## 2019-10-01 ENCOUNTER — Encounter: Payer: Self-pay | Admitting: Pulmonary Disease

## 2019-10-01 ENCOUNTER — Other Ambulatory Visit
Admission: RE | Admit: 2019-10-01 | Discharge: 2019-10-01 | Disposition: A | Discharge status: Home / Self Care | Payer: Medicare Other | Source: Ambulatory Visit | Attending: Pulmonary Disease | Admitting: Pulmonary Disease

## 2019-10-01 VITALS — BP 142/92 | HR 85 | Temp 98.0°F | Ht 71.0 in | Wt 245.4 lb

## 2019-10-01 DIAGNOSIS — G4733 Obstructive sleep apnea (adult) (pediatric): Secondary | ICD-10-CM

## 2019-10-01 DIAGNOSIS — Z720 Tobacco use: Secondary | ICD-10-CM | POA: Diagnosis not present

## 2019-10-01 DIAGNOSIS — R079 Chest pain, unspecified: Secondary | ICD-10-CM

## 2019-10-01 DIAGNOSIS — F17290 Nicotine dependence, other tobacco product, uncomplicated: Secondary | ICD-10-CM | POA: Insufficient documentation

## 2019-10-01 DIAGNOSIS — R0600 Dyspnea, unspecified: Secondary | ICD-10-CM

## 2019-10-01 DIAGNOSIS — J4550 Severe persistent asthma, uncomplicated: Secondary | ICD-10-CM | POA: Diagnosis not present

## 2019-10-01 DIAGNOSIS — Z79899 Other long term (current) drug therapy: Secondary | ICD-10-CM | POA: Insufficient documentation

## 2019-10-01 DIAGNOSIS — R000 Tachycardia, unspecified: Secondary | ICD-10-CM | POA: Diagnosis present

## 2019-10-01 DIAGNOSIS — J410 Simple chronic bronchitis: Secondary | ICD-10-CM | POA: Diagnosis not present

## 2019-10-01 DIAGNOSIS — I10 Essential (primary) hypertension: Secondary | ICD-10-CM | POA: Insufficient documentation

## 2019-10-01 DIAGNOSIS — F1910 Other psychoactive substance abuse, uncomplicated: Secondary | ICD-10-CM

## 2019-10-01 DIAGNOSIS — R5381 Other malaise: Secondary | ICD-10-CM

## 2019-10-01 DIAGNOSIS — R42 Dizziness and giddiness: Secondary | ICD-10-CM | POA: Insufficient documentation

## 2019-10-01 DIAGNOSIS — F1011 Alcohol abuse, in remission: Secondary | ICD-10-CM

## 2019-10-01 DIAGNOSIS — F1911 Other psychoactive substance abuse, in remission: Secondary | ICD-10-CM | POA: Insufficient documentation

## 2019-10-01 DIAGNOSIS — R0602 Shortness of breath: Secondary | ICD-10-CM | POA: Diagnosis not present

## 2019-10-01 LAB — CBC WITH DIFFERENTIAL/PLATELET
Abs Immature Granulocytes: 0.04 10*3/uL (ref 0.00–0.07)
Basophils Absolute: 0.1 10*3/uL (ref 0.0–0.1)
Basophils Relative: 1 %
Eosinophils Absolute: 0.2 10*3/uL (ref 0.0–0.5)
Eosinophils Relative: 2 %
HCT: 43.9 % (ref 39.0–52.0)
Hemoglobin: 13.5 g/dL (ref 13.0–17.0)
Immature Granulocytes: 0 %
Lymphocytes Relative: 24 %
Lymphs Abs: 3.3 10*3/uL (ref 0.7–4.0)
MCH: 20.5 pg — ABNORMAL LOW (ref 26.0–34.0)
MCHC: 30.8 g/dL (ref 30.0–36.0)
MCV: 66.7 fL — ABNORMAL LOW (ref 80.0–100.0)
Monocytes Absolute: 1.3 10*3/uL — ABNORMAL HIGH (ref 0.1–1.0)
Monocytes Relative: 9 %
Neutro Abs: 8.7 10*3/uL — ABNORMAL HIGH (ref 1.7–7.7)
Neutrophils Relative %: 64 %
Platelets: 370 10*3/uL (ref 150–400)
RBC: 6.58 MIL/uL — ABNORMAL HIGH (ref 4.22–5.81)
RDW: 19 % — ABNORMAL HIGH (ref 11.5–15.5)
WBC: 13.7 10*3/uL — ABNORMAL HIGH (ref 4.0–10.5)
nRBC: 0 % (ref 0.0–0.2)

## 2019-10-01 NOTE — Assessment & Plan Note (Signed)
Plan: We recommend that you stop using smokeless tobacco

## 2019-10-01 NOTE — Assessment & Plan Note (Signed)
Patient continues to have ongoing exertional chest pain after ambulation our office EKG shows prolonged QT normal sinus rhythm On exam sounded like patient was having PVCs EKG is comparable to 2019 EKG on file  Plan: Patient needs further evaluation in acute care setting We are routing the patient to [facility] regional emergency room for blood work, chest imaging and further evaluation

## 2019-10-01 NOTE — ED Provider Notes (Signed)
[facility] Emergency Department Provider Note   ____________________________________________   First MD Initiated Contact with Patient 10/01/19 1342     (approximate)  I have reviewed the triage vital signs and the nursing notes.   HISTORY  Chief Complaint Tachycardia    HPI Cody Day is a 63 y.o. male patient sent from nephrologist for evaluation of tachycardia episode.  Patient state he had a walk a long distance from where he parked his car to get into the clinic.  Patient say when he sat down take his vital signs his pulse rates were elevated.  Patient had nurse practitioner came and then had him walk up and down the hallway and he was told his pulse was elevated at 170.  Patient state he got little lightheaded walking up and on the hallway but that is normal because of his underlying medical condition of COPD.  Patient denies chest pain, but stated mild dyspnea while walking at a distance of 250 feet.  States the heart rate was measured by  pulseoximeter.  Patient denies any distress at this time.         Past Medical History:  Diagnosis Date  . Achilles tendon rupture 10/11   and repair  . Adenomatous polyp of colon 04/06/2010   Adenomatous polyps 3 in the descending colon, times one in the ascending colon [facility])  . Asthma   . COPD (chronic obstructive pulmonary disease) ([place])   . Gout   . Hemorrhoids   . Hypercholesteremia   . Hypertension   . Sleep apnea    doesn't wear CPAP  . Syncope 10/11   in settin gof asthma exacerbation w coughing. Ech (10/11): EF > 55%, mild LVH, grade I diastolic dysfunction, nomral RV size and systolic function,. normal valves. Carotid US (10/11): minimal disease    Patient Active Problem List   Diagnosis Date Noted  . Dizziness 10/01/2019  . Polysubstance abuse ([place]) 10/01/2019  . Medication management 09/20/2019  . Nocturnal hypoxia 09/20/2019  . Smokeless tobacco use 09/20/2019  . Physical  deconditioning 09/20/2019  . Healthcare maintenance 09/19/2019  . Former smoker 09/19/2019  . Abnormal PSA 06/03/2019  . Hypogonadism in male 06/03/2019  . Varicose veins of leg with pain, bilateral 05/04/2019  . COPD exacerbation ([place]) 11/10/2018  . Alcohol abuse, in remission 08/18/2018  . MDD (major depressive disorder), single episode, moderate ([place]) 08/18/2018  . Hyponatremia 07/20/2018  . Low testosterone 06/18/2018  . Gynecomastia 04/17/2018  . Enteritis due to Clostridium difficile 07/29/2016  . History of colonic polyps 06/30/2015  . Cardiomyopathy 04/25/2015  . Dyspnea 03/22/2015  . Neuropathy 07/28/2014  . Benign essential tremor 07/28/2014  . History of ventral hernia repair 05/24/2014  . Allergic rhinitis 03/21/2014  . Memory loss 03/26/2013  . Generalized anxiety disorder 12/01/2012  . OSA (obstructive sleep apnea) 12/05/2011  . NECK PAIN, RIGHT 05/04/2008  . Coronary atherosclerosis 12/08/2007  . PROSTATITIS, CHRONIC 08/20/2007  . NEPHROLITHIASIS, HX OF 08/20/2007  . Hyperlipidemia LDL goal <70 06/17/2007  . Prediabetes 06/17/2007  . Gout 06/09/2007  . Body mass index (BMI) of 33.0 to 33.9 in adult 06/09/2007  . Essential hypertension, benign 06/09/2007  . Chronic asthma 06/09/2007  . Exertional chest pain 06/09/2007    Past Surgical History:  Procedure Laterality Date  . ABDOMINAL HERNIA REPAIR  1998,2000  . calcium kidney stones    . [facility], [name], [place]; Dr. [name]. Multiple adenomatous polyps (total 4).   . COLONOSCOPY WITH PROPOFOL N/A 08/30/2015   Procedure: COLONOSCOPY WITH PROPOFOL;  Surgeon: [name], MD;  Location: [facility];  Service: Endoscopy;  Laterality: N/A;  . FEMORAL HERNIA REPAIR  2007  . HEMORRHOID SURGERY    . HERNIA REPAIR  09/10/2010   Repair of recurrent ventral hernia, resection of previously placed mesh x2, implantation 7.5 x 10  cm Gore-Tex dual mesh in an underlay position, repair of epigastric hernia with a 4.2 cm Proceed ventral patch.  Marland Kitchen HERNIA REPAIR   02//28/2007   Laparoscopic right inguinal hernia repair with Surgipro mesh, Ventrilex patch at umbilicus  . HERNIA REPAIR  11/01/1990  .   Small oval Kugel patch placed in preperitoneal space, [name], MD  . HERNIA REPAIR  01/17/1997   Recurrent ventral hernia 15 x 19 Gore-Tex dual mesh placed laparoscopically with multiple lefft--sided ports, [name], MD  . HERNIA REPAIR  01/07/1995    Primary repair of ventral hernia. [name], MD  . intestinal blockage     as a child  . KIDNEY STONE extraction     unic acid stones  . left heart cath  2008   minimal luminal irregularities EF 55%  . RIGHT/LEFT HEART CATH AND CORONARY ANGIOGRAPHY N/A 01/13/2017   Procedure: Right/Left Heart Cath and Coronary Angiography;  Surgeon: [name], MD;  Location: [facility];  Service: Cardiovascular;  Laterality: N/A;  . treadmill stress test  2003    Prior to Admission medications   Medication Sig Start Date End Date Taking? Authorizing Provider  albuterol (VENTOLIN HFA) 108 (90 Base) MCG/ACT inhaler Inhale 2 puffs into the lungs every 4 (four) hours as needed for wheezing or shortness of breath. 09/28/18   Flora Lipps, MD  allopurinol (ZYLOPRIM) 300 MG tablet TAKE 1 TABLET BY MOUTH DAILY 07/14/19   Bedsole, Amy E, MD  citalopram (CELEXA) 40 MG tablet Take 1 tablet (40 mg total) by mouth daily. 08/31/19   Bedsole, Amy E, MD  fluticasone (FLOVENT HFA) 220 MCG/ACT inhaler Inhale 2 puffs into the lungs 2 (two) times daily. 11/23/18   Bedsole, Amy E, MD  lisinopril (ZESTRIL) 20 MG tablet TAKE 1 TABLET BY MOUTH DAILY 07/14/19   Bedsole, Amy E, MD  montelukast (SINGULAIR) 10 MG tablet TAKE 1 TABLET BY MOUTH DAILY 07/14/19   Bedsole, Amy E, MD  Multiple Vitamin (MULTIVITAMIN WITH MINERALS) TABS tablet Take 1 tablet by mouth daily. 07/22/18   Gladstone Lighter, MD    simvastatin (ZOCOR) 20 MG tablet TAKE 1 TABLET BY MOUTH DAILY 03/22/19   Bedsole, Amy E, MD  STIOLTO RESPIMAT 2.5-2.5 MCG/ACT AERS Use two inhalations every day 09/14/18   [name], MD  testosterone cypionate (DEPOTESTOSTERONE CYPIONATE) 200 MG/ML injection Inject 0.3 mLs (60 mg total) into the muscle once a week. 07/18/19   Stoioff, Ronda Fairly, MD    Allergies Levaquin [levofloxacin in d5w] and Penicillins  Family History  Problem Relation Age of Onset  . Hypertension Father   . Cataracts Father   . Diabetes Father   . Heart attack Mother 71  . Coronary artery disease Mother   . Dementia Mother   . Hypertension Sister   . Depression Sister   . Hypertension Brother   . Colon cancer Brother   . Diabetes Other        1st degree relative  . Colon cancer Maternal Aunt  Social History Social History   Tobacco Use  . Smoking status: Former Smoker    Packs/day: 0.25    Years: 35.00    Pack years: 8.75    Types: Cigarettes    Quit date: 10/22/1995    Years since quitting: 23.9  . Smokeless tobacco: Current User    Types: Chew  . Tobacco comment: rarely smoked cigarettes and cigars.  states he smoked drugs mostly  Substance Use Topics  . Alcohol use: Yes    Alcohol/week: 0.0 standard drinks    Comment: 6 beers daily; used to use cocaine and methamphetamine  . Drug use: No    Types: Marijuana, Methamphetamines    Comment: former smokerx 27 years    Review of Systems Constitutional: No fever/chills Eyes: No visual changes. ENT: No sore throat. Cardiovascular: Denies chest pain. Respiratory: Shortness of breath with mild exertion secondary to COPD.Marland Kitchen Gastrointestinal: No abdominal pain.  No nausea, no vomiting.  No diarrhea.  No constipation. Genitourinary: Negative for dysuria. Musculoskeletal: Negative for back pain. Skin: Negative for rash. Neurological: Negative for headaches, focal weakness or numbness. Endocrine:  Hyperlipidemia and  hypertension. Allergic/Immunilogical: Levaquin and penicillin ____________________________________________   PHYSICAL EXAM:  VITAL SIGNS: ED Triage Vitals  Enc Vitals Group     BP 10/01/19 1305 138/90     Pulse Rate 10/01/19 1305 87     Resp 10/01/19 1305 16     Temp 10/01/19 1305 98.8 F (37.1 C)     Temp Source 10/01/19 1305 Oral     SpO2 10/01/19 1305 97 %     Weight --      Height --      Head Circumference --      Peak Flow --      Pain Score 10/01/19 1314 0     Pain Loc --      Pain Edu? --      Excl. in [place]? --     Constitutional: Alert and oriented. Well appearing and in no acute distress. Eyes: Conjunctivae are normal. PERRL. EOMI. Head: Atraumatic. Nose: No congestion/rhinnorhea. Mouth/Throat: Mucous membranes are moist.  Oropharynx non-erythematous. Neck: No stridor. Cardiovascular: Normal rate, regular rhythm. Grossly normal heart sounds.  Good peripheral circulation. Respiratory: Normal respiratory effort.  No retractions. Lungs with mild expiratory wheezing.  Patient was able to walk greater than 250 feet for pulse ox of 96% and pulse of 95. Gastrointestinal: Soft and nontender. No distention. No abdominal bruits. No CVA tenderness. Musculoskeletal: No lower extremity tenderness nor edema.  No joint effusions. Neurologic:  Normal speech and language. No gross focal neurologic deficits are appreciated. No gait instability. Skin:  Skin is warm, dry and intact. No rash noted. Psychiatric: Mood and affect are normal. Speech and behavior are normal.  ____________________________________________   LABS (all labs ordered are listed, but only abnormal results are displayed)  Labs Reviewed - No data to display ____________________________________________  EKG  Read by heart station Dr. Craig Staggers sinus rhythm.  ___________________________________________  Tom Green  ED MD interpretation:    Official radiology report(s): No results  found.  ____________________________________________   PROCEDURES  Procedure(s) performed (including Critical Care):  Procedures   ____________________________________________   INITIAL IMPRESSION / ASSESSMENT AND PLAN / ED COURSE  As part of my medical decision making, I reviewed the following data within the [place]     Patient presents with single tachycardic episode and mild dyspnea prior to arrival.  Physical exam is grossly unremarkable.  Pulse ox is 96%  and pulse is 95.  Patient does not present with any acute cardiac or pulmonary distress.  Advised to follow-up with pulmonologist.    [name] was evaluated in Emergency Department on 10/01/2019 for the symptoms described in the history of present illness. He was evaluated in the context of the global COVID-19 pandemic, which necessitated consideration that the patient might be at risk for infection with the SARS-CoV-2 virus that causes COVID-19. Institutional protocols and algorithms that pertain to the evaluation of patients at risk for COVID-19 are in a state of rapid change based on information released by regulatory bodies including the CDC and federal and state organizations. These policies and algorithms were followed during the patient's care in the ED.        ____________________________________________   FINAL CLINICAL IMPRESSION(S) / ED DIAGNOSES  Final diagnoses:  Simple chronic bronchitis [facility])     ED Discharge Orders    None       Note:  This document was prepared using Dragon voice recognition software and may include unintentional dictation errors.    Sable Feil, PA-C 10/01/19 1415    Arta Silence, MD 10/01/19 [phone]

## 2019-10-01 NOTE — Addendum Note (Signed)
Addended by: Maryanna Shape A on: 10/01/2019 02:35 PM   Modules accepted: Orders

## 2019-10-01 NOTE — Assessment & Plan Note (Signed)
Plan: Continue Stiolto Respimat Continue Flovent HFA Continue rescue inhaler Likely need to consider different medication uses as well as blood work at next office visit to check for peripheral eosinophilia  Repeat PFTs

## 2019-10-01 NOTE — Addendum Note (Signed)
Addended by: Santiago Bur on: 10/01/2019 03:25 PM   Modules accepted: Orders

## 2019-10-01 NOTE — ED Notes (Signed)
Light green and lavender top blood tubes drawn on pt.

## 2019-10-01 NOTE — Assessment & Plan Note (Signed)
Plan: Likely would benefit from a urine drug screen when evaluated the emergency room

## 2019-10-01 NOTE — Assessment & Plan Note (Signed)
Significant history of alcohol abuse  Plan: We continue to recommend that you do not abuse alcohol

## 2019-10-01 NOTE — Assessment & Plan Note (Signed)
Plan: Patient will be routed to [facility] emergency room via wheelchair from our clinical staff

## 2019-10-01 NOTE — Assessment & Plan Note (Signed)
Severe OSA Noncompliant due to mask fitting use Poor historian when it comes to who is managing last looks like this may have been neurology  Plan: Coordinate sleep MD eval in [place] sleep clinic

## 2019-10-01 NOTE — Patient Instructions (Addendum)
You were seen today by Lauraine Rinne, NP  for:   1. Severe persistent chronic asthma without complication  2. OSA (obstructive sleep apnea)  3. Smokeless tobacco use  4. Physical deconditioning  5. Dizziness  6. Exertional chest pain  7. Dyspnea, unspecified type  Patient continues to have persistent symptoms of dyspnea, dizziness, lightheadedness and exertional chest pain.  EKG shows normal sinus rhythm but prolonged QT.  Does compare to 2019 EKG in the chart.  With patient's exertional symptoms as well as inability to simply walk short distances without becoming lightheaded.  He is high risk for syncopal episode and needs emergent evaluation.  We will route patient to the emergency room at [facility] for further evaluation, blood work and potential hospital admit  I have attempted to contact patient's spouse Levada Dy: [phone]  Follow Up:    Return in about 2 weeks (around 10/15/2019), or if symptoms worsen or fail to improve, for [employer] - MD or APP .   Please do your part to reduce the spread of COVID-19:      Reduce your risk of any infection  and COVID19 by using the similar precautions used for avoiding the common cold or flu:  Marland Kitchen Wash your hands often with soap and warm water for at least 20 seconds.  If soap and water are not readily available, use an alcohol-based hand sanitizer with at least 60% alcohol.  . If coughing or sneezing, cover your mouth and nose by coughing or sneezing into the elbow areas of your shirt or coat, into a tissue or into your sleeve (not your hands). Langley Gauss A MASK when in public  . Avoid shaking hands with others and consider head nods or verbal greetings only. . Avoid touching your eyes, nose, or mouth with unwashed hands.  . Avoid close contact with people who are sick. . Avoid places or events with large numbers of people in one location, like concerts or sporting events. . If you have some symptoms but not all  symptoms, continue to monitor at home and seek medical attention if your symptoms worsen. . If you are having a medical emergency, call 911.   [place] / e-Visit: [email or web address]         [facility]: [facility]: [id number]                   [facility]: [id number]     It is flu season:   >>> Best ways to protect herself from the flu: Receive the yearly flu vaccine, practice good hand hygiene washing with soap and also using hand sanitizer when available, eat a nutritious meals, get adequate rest, hydrate appropriately   Please contact the office if your symptoms worsen or you have concerns that you are not improving.   Thank you for choosing [facility] for your healthcare, and for allowing [place] to partner with you on your healthcare journey. I am thankful to be able to provide care to you today.   Wyn Quaker FNP-C

## 2019-10-01 NOTE — Assessment & Plan Note (Signed)
Plan: Continue forward with plan pulmonary rehab

## 2019-10-01 NOTE — Telephone Encounter (Signed)
Labs, echo and CXR have been ordered.  Pt has been scheduled for OV on 10/08/2019 with Beth. Suanne Marker will confirm appt with pt when scheduling echo. Nothing further is needed.

## 2019-10-01 NOTE — Discharge Instructions (Addendum)
After walking approximately 325 feet you pulse ox was 96% and your pulse rate was 95.  Follow pulmonology for medications.

## 2019-10-01 NOTE — ED Triage Notes (Signed)
Pt to ED via POV for chief complaint of tachycardia, sent from pulmonologist.  Reports sometimes feeling lightheadedness when moving his head and walking but says this is his normal d/t having hx COPD. Denies chest pain. VSS at this time. Pt HR 87.  Pt in NAD at this time.

## 2019-10-01 NOTE — ED Notes (Signed)
See triage note  States he went to see his MD today    States they were going to try and change his inhalers  Denies any chest pain   States he has COPD and is SOB anyway  Denies any fever or cough

## 2019-10-01 NOTE — Assessment & Plan Note (Signed)
EKG stable today compared to 2019  Patient continues to have persistent dizziness, lightheadedness and exertional chest pain  Plan: Patient will be transported to [facility] emergency room for further evaluation

## 2019-10-01 NOTE — Telephone Encounter (Signed)
10/01/2019 1430  Patient presented back to our office after being discharged from the emergency room today.  No baseline labs were drawn.  Apparently evaluation in the emergency room was stable.  No chest x-ray imaging was obtained.  We recommended to the patient that he get baseline lab work drawn today:  CBC C-Met BNP D-dimer  We would also recommend that he obtain a chest x-ray.  We would also order an echocardiogram to further evaluate his ongoing and worsened dyspnea.  I believe it is reasonable to obtain baseline dyspnea work-up given his symptoms and evaluation today in our office.  Also due to the fact that he has ongoing dyspnea.  Unfortunately this was not obtained in the emergency room.  I see a note where lab work was done but I do not see any pending orders or results in patient's chart.    Patient will need follow-up with our office in 2 weeks to further evaluate his dyspnea.  This can be with [facility] MD/DO or an APP.  Preferably Dr. Mortimer Fries as this is his patient.  I would also recommend the patient follow-up with primary care.  Wyn Quaker, FNP

## 2019-10-04 ENCOUNTER — Other Ambulatory Visit
Admission: RE | Admit: 2019-10-04 | Discharge: 2019-10-04 | Disposition: A | Discharge status: Home / Self Care | Payer: Medicare Other | Source: Ambulatory Visit | Attending: Pulmonary Disease | Admitting: Pulmonary Disease

## 2019-10-04 ENCOUNTER — Encounter: Payer: Medicare Other | Attending: Internal Medicine

## 2019-10-04 ENCOUNTER — Other Ambulatory Visit: Payer: Self-pay

## 2019-10-04 DIAGNOSIS — R0600 Dyspnea, unspecified: Secondary | ICD-10-CM | POA: Diagnosis present

## 2019-10-04 DIAGNOSIS — E7800 Pure hypercholesterolemia, unspecified: Secondary | ICD-10-CM | POA: Insufficient documentation

## 2019-10-04 DIAGNOSIS — J4550 Severe persistent asthma, uncomplicated: Secondary | ICD-10-CM | POA: Insufficient documentation

## 2019-10-04 DIAGNOSIS — J449 Chronic obstructive pulmonary disease, unspecified: Secondary | ICD-10-CM | POA: Insufficient documentation

## 2019-10-04 DIAGNOSIS — Z87891 Personal history of nicotine dependence: Secondary | ICD-10-CM | POA: Insufficient documentation

## 2019-10-04 DIAGNOSIS — I10 Essential (primary) hypertension: Secondary | ICD-10-CM | POA: Insufficient documentation

## 2019-10-04 DIAGNOSIS — G4730 Sleep apnea, unspecified: Secondary | ICD-10-CM | POA: Insufficient documentation

## 2019-10-04 DIAGNOSIS — Z79899 Other long term (current) drug therapy: Secondary | ICD-10-CM | POA: Insufficient documentation

## 2019-10-04 LAB — CBC WITH DIFFERENTIAL/PLATELET
Abs Immature Granulocytes: 0.02 10*3/uL (ref 0.00–0.07)
Basophils Absolute: 0.1 10*3/uL (ref 0.0–0.1)
Basophils Relative: 1 %
Eosinophils Absolute: 0.3 10*3/uL (ref 0.0–0.5)
Eosinophils Relative: 3 %
HCT: 43.5 % (ref 39.0–52.0)
Hemoglobin: 13 g/dL (ref 13.0–17.0)
Immature Granulocytes: 0 %
Lymphocytes Relative: 23 %
Lymphs Abs: 2 10*3/uL (ref 0.7–4.0)
MCH: 20.7 pg — ABNORMAL LOW (ref 26.0–34.0)
MCHC: 29.9 g/dL — ABNORMAL LOW (ref 30.0–36.0)
MCV: 69.3 fL — ABNORMAL LOW (ref 80.0–100.0)
Monocytes Absolute: 0.8 10*3/uL (ref 0.1–1.0)
Monocytes Relative: 9 %
Neutro Abs: 5.5 10*3/uL (ref 1.7–7.7)
Neutrophils Relative %: 64 %
Platelets: 321 10*3/uL (ref 150–400)
RBC: 6.28 MIL/uL — ABNORMAL HIGH (ref 4.22–5.81)
RDW: 18.8 % — ABNORMAL HIGH (ref 11.5–15.5)
Smear Review: NORMAL
WBC: 8.6 10*3/uL (ref 4.0–10.5)
nRBC: 0 % (ref 0.0–0.2)

## 2019-10-04 LAB — COMPREHENSIVE METABOLIC PANEL
ALT: 24 U/L (ref 0–44)
AST: 25 U/L (ref 15–41)
Albumin: 4.1 g/dL (ref 3.5–5.0)
Alkaline Phosphatase: 52 U/L (ref 38–126)
Anion gap: 10 (ref 5–15)
BUN: 12 mg/dL (ref 8–23)
CO2: 24 mmol/L (ref 22–32)
Calcium: 9 mg/dL (ref 8.9–10.3)
Chloride: 100 mmol/L (ref 98–111)
Creatinine, Ser: 0.65 mg/dL (ref 0.61–1.24)
GFR calc Af Amer: >60 mL/min (ref >60)
GFR calc non Af Amer: >60 mL/min (ref >60)
Glucose, Bld: 115 mg/dL — ABNORMAL HIGH (ref 70–99)
Potassium: 3.8 mmol/L (ref 3.5–5.1)
Sodium: 134 mmol/L — ABNORMAL LOW (ref 135–145)
Total Bilirubin: 1.4 mg/dL — ABNORMAL HIGH (ref 0.3–1.2)
Total Protein: 6.9 g/dL (ref 6.5–8.1)

## 2019-10-04 LAB — FIBRIN DERIVATIVES D-DIMER (ARMC ONLY): Fibrin derivatives D-dimer (ARMC): 304.03 ng/mL (FEU) (ref 0.00–499.00)

## 2019-10-04 LAB — BRAIN NATRIURETIC PEPTIDE: B Natriuretic Peptide: 34 pg/mL (ref 0.0–100.0)

## 2019-10-04 NOTE — Progress Notes (Signed)
Virtual Orientation performed. Patient informed when to come in for RD and EP orientation. Diagnosis can be found in [facility] Encounters 10/01/2019.

## 2019-10-05 ENCOUNTER — Encounter: Payer: Medicare Other | Admitting: *Deleted

## 2019-10-05 VITALS — Ht 71.1 in | Wt 244.7 lb

## 2019-10-05 DIAGNOSIS — Z79899 Other long term (current) drug therapy: Secondary | ICD-10-CM | POA: Diagnosis not present

## 2019-10-05 DIAGNOSIS — G4730 Sleep apnea, unspecified: Secondary | ICD-10-CM | POA: Diagnosis not present

## 2019-10-05 DIAGNOSIS — J449 Chronic obstructive pulmonary disease, unspecified: Secondary | ICD-10-CM | POA: Diagnosis not present

## 2019-10-05 DIAGNOSIS — E7800 Pure hypercholesterolemia, unspecified: Secondary | ICD-10-CM | POA: Diagnosis not present

## 2019-10-05 DIAGNOSIS — J4550 Severe persistent asthma, uncomplicated: Secondary | ICD-10-CM | POA: Diagnosis not present

## 2019-10-05 DIAGNOSIS — Z87891 Personal history of nicotine dependence: Secondary | ICD-10-CM | POA: Diagnosis not present

## 2019-10-05 DIAGNOSIS — I10 Essential (primary) hypertension: Secondary | ICD-10-CM | POA: Diagnosis not present

## 2019-10-05 NOTE — Progress Notes (Signed)
Cardiac Individual Treatment Plan  Patient Details  Name: Cody Day MRN: [id number] Date of Birth: 1955-12-09 Referring Provider:     Pulmonary Rehab from 10/05/2019 in [facility] Cardiac and Pulmonary Rehab  Referring Provider  [name] MD      Initial Encounter Date:    Pulmonary Rehab from 10/05/2019 in [facility] Cardiac and Pulmonary Rehab  Date  10/05/19      Visit Diagnosis: Severe persistent chronic asthma without complication  Patient's Home Medications on Admission:  Current Outpatient Medications:  .  albuterol (VENTOLIN HFA) 108 (90 Base) MCG/ACT inhaler, Inhale 2 puffs into the lungs every 4 (four) hours as needed for wheezing or shortness of breath., Disp: 1 Inhaler, Rfl: 10 .  allopurinol (ZYLOPRIM) 300 MG tablet, TAKE 1 TABLET BY MOUTH DAILY, Disp: 90 tablet, Rfl: 1 .  citalopram (CELEXA) 40 MG tablet, Take 1 tablet (40 mg total) by mouth daily., Disp: 30 tablet, Rfl: 5 .  fluticasone (FLOVENT HFA) 220 MCG/ACT inhaler, Inhale 2 puffs into the lungs 2 (two) times daily., Disp: 1 Inhaler, Rfl: 5 .  lisinopril (ZESTRIL) 20 MG tablet, TAKE 1 TABLET BY MOUTH DAILY, Disp: 90 tablet, Rfl: 1 .  montelukast (SINGULAIR) 10 MG tablet, TAKE 1 TABLET BY MOUTH DAILY, Disp: 90 tablet, Rfl: 1 .  Multiple Vitamin (MULTIVITAMIN WITH MINERALS) TABS tablet, Take 1 tablet by mouth daily., Disp: 30 tablet, Rfl: 2 .  simvastatin (ZOCOR) 20 MG tablet, TAKE 1 TABLET BY MOUTH DAILY, Disp: 90 tablet, Rfl: 3 .  STIOLTO RESPIMAT 2.5-2.5 MCG/ACT AERS, Use two inhalations every day, Disp: 4 g, Rfl: 5 .  testosterone cypionate (DEPOTESTOSTERONE CYPIONATE) 200 MG/ML injection, Inject 0.3 mLs (60 mg total) into the muscle once a week., Disp: 6 mL, Rfl: 0  Past Medical History: Past Medical History:  Diagnosis Date  . Achilles tendon rupture 10/11   and repair  . Adenomatous polyp of colon 04/06/2010   Adenomatous polyps 3 in the descending colon, times one in the ascending colon [facility])  .  Asthma   . COPD (chronic obstructive pulmonary disease) ([place])   . Gout   . Hemorrhoids   . Hypercholesteremia   . Hypertension   . Sleep apnea    doesn't wear CPAP  . Syncope 10/11   in settin gof asthma exacerbation w coughing. Ech (10/11): EF > 55%, mild LVH, grade I diastolic dysfunction, nomral RV size and systolic function,. normal valves. Carotid US (10/11): minimal disease    Tobacco Use: Social History   Tobacco Use  Smoking Status Former Smoker  . Packs/day: 0.25  . Years: 35.00  . Pack years: 8.75  . Types: Cigarettes  . Quit date: 10/22/1995  . Years since quitting: 23.9  Smokeless Tobacco Current User  . Types: Chew  Tobacco Comment   rarely smoked cigarettes and cigars.  states he smoked drugs mostly    Labs: Recent Review Flowsheet Data    Labs for ITP Cardiac and Pulmonary Rehab Latest Ref Rng & Units 08/23/2016 04/01/2018 07/20/2018 02/15/2019 08/30/2019   Cholestrol 0 - 200 mg/dL 174 168 - 136 123   LDLCALC 0 - 99 mg/dL - 75 - 71 57   LDLDIRECT mg/dL 53.0 - - - -   HDL >39.00 mg/dL 97.10 81.60 - 44.50 51.00   Trlycerides 0.0 - 149.0 mg/dL 257.0(H) 58.0 - 103.0 76.0   Hemoglobin A1c 4.6 - 6.5 % 6.2 6.1 5.7(H) 6.8(H) 6.3   TCO2 0 - 100 mmol/L - - - - -  Exercise Target Goals: Exercise Program Goal: Individual exercise prescription set using results from initial 6 min walk test and THRR while considering  patient's activity barriers and safety.   Exercise Prescription Goal: Initial exercise prescription builds to 30-45 minutes a day of aerobic activity, 2-3 days per week.  Home exercise guidelines will be given to patient during program as part of exercise prescription that the participant will acknowledge.  Activity Barriers & Risk Stratification: Activity Barriers & Cardiac Risk Stratification - 10/05/19 1451      Activity Barriers & Cardiac Risk Stratification   Activity Barriers  Deconditioning;Muscular Weakness;Other (comment);Shortness of  Breath;Balance Concerns    Comments  shuffles feet, tremors       6 Minute Walk: 6 Minute Walk    Row Name 10/05/19 1441         6 Minute Walk   Phase  Initial     Distance  994 feet     Walk Time  6 minutes     # of Rest Breaks  0     MPH  1.88     METS  2.82     RPE  15     Perceived Dyspnea   3     VO2 Peak  9.88     Symptoms  Yes (comment)     Comments  hip pain 3/10, SOB, knee pain 3/10, SOB     Resting HR  90 bpm     Resting BP  112/64     Resting Oxygen Saturation   97 %     Exercise Oxygen Saturation  during 6 min walk  95 %     Max Ex. HR  125 bpm     Max Ex. BP  146/64     2 Minute Post BP  134/70       Interval HR   1 Minute HR  99     2 Minute HR  119     3 Minute HR  114     4 Minute HR  102     5 Minute HR  125     6 Minute HR  98     2 Minute Post HR  92     Interval Heart Rate?  Yes       Interval Oxygen   Interval Oxygen?  Yes     Baseline Oxygen Saturation %  97 %     1 Minute Oxygen Saturation %  96 %     1 Minute Liters of Oxygen  0 L Room Air     2 Minute Oxygen Saturation %  95 %     2 Minute Liters of Oxygen  0 L     3 Minute Oxygen Saturation %  96 %     3 Minute Liters of Oxygen  0 L     4 Minute Oxygen Saturation %  95 %     4 Minute Liters of Oxygen  0 L     5 Minute Oxygen Saturation %  95 %     5 Minute Liters of Oxygen  0 L     6 Minute Oxygen Saturation %  96 %     6 Minute Liters of Oxygen  0 L     2 Minute Post Oxygen Saturation %  97 %     2 Minute Post Liters of Oxygen  0 L        Oxygen Initial Assessment: Oxygen Initial Assessment -  10/04/19 1036      Home Oxygen   Home Oxygen Device  Home Concentrator    Sleep Oxygen Prescription  Continuous    Liters per minute  2    Home Exercise Oxygen Prescription  None    Home at Rest Exercise Oxygen Prescription  None    Compliance with Home Oxygen Use  Yes      Initial 6 min Walk   Oxygen Used  None      Program Oxygen Prescription   Program Oxygen Prescription   None      Intervention   Short Term Goals  To learn and exhibit compliance with exercise, home and travel O2 prescription;To learn and understand importance of maintaining oxygen saturations>88%;To learn and demonstrate proper use of respiratory medications;To learn and demonstrate proper pursed lip breathing techniques or other breathing techniques.;To learn and understand importance of monitoring SPO2 with pulse oximeter and demonstrate accurate use of the pulse oximeter.    Long  Term Goals  Verbalizes importance of monitoring SPO2 with pulse oximeter and return demonstration;Exhibits proper breathing techniques, such as pursed lip breathing or other method taught during program session;Demonstrates proper use of MDI's;Exhibits compliance with exercise, home and travel O2 prescription;Maintenance of O2 saturations>88%;Compliance with respiratory medication       Oxygen Re-Evaluation:   Oxygen Discharge (Final Oxygen Re-Evaluation):   Initial Exercise Prescription: Initial Exercise Prescription - 10/05/19 1400      Date of Initial Exercise RX and Referring Provider   Date  10/05/19    Referring Provider  [name] MD      Treadmill   MPH  1.6    Grade  0.5    Minutes  15    METs  2.34      NuStep   Level  2    SPM  80    Minutes  15    METs  2      REL-XR   Level  1    Speed  50    Minutes  15    METs  2      T5 Nustep   Level  2    SPM  80    Minutes  15    METs  2      Prescription Details   Frequency (times per week)  3    Duration  Progress to 30 minutes of continuous aerobic without signs/symptoms of physical distress      Intensity   THRR 40-80% of Max Heartrate  117-144    Ratings of Perceived Exertion  11-13    Perceived Dyspnea  0-4      Progression   Progression  Continue to progress workloads to maintain intensity without signs/symptoms of physical distress.      Resistance Training   Training Prescription  Yes    Weight  3 lb    Reps   10-15       Perform Capillary Blood Glucose checks as needed.  Exercise Prescription Changes: Exercise Prescription Changes    Row Name 10/05/19 1400             Response to Exercise   Blood Pressure (Admit)  112/64       Blood Pressure (Exercise)  146/64       Blood Pressure (Exit)  142/70       Heart Rate (Admit)  90 bpm       Heart Rate (Exercise)  125 bpm       Heart  Rate (Exit)  95 bpm       Oxygen Saturation (Admit)  97 %       Oxygen Saturation (Exercise)  95 %       Oxygen Saturation (Exit)  98 %       Rating of Perceived Exertion (Exercise)  15       Perceived Dyspnea (Exercise)  3       Symptoms  hip pain 3/10, knee pain 3/10, SOB       Comments  walk test results          Exercise Comments:   Exercise Goals and Review: Exercise Goals    Row Name 10/05/19 1455             Exercise Goals   Increase Physical Activity  Yes       Intervention  Provide advice, education, support and counseling about physical activity/exercise needs.;Develop an individualized exercise prescription for aerobic and resistive training based on initial evaluation findings, risk stratification, comorbidities and participant's personal goals.       Expected Outcomes  Short Term: Attend rehab on a regular basis to increase amount of physical activity.;Long Term: Add in home exercise to make exercise part of routine and to increase amount of physical activity.;Long Term: Exercising regularly at least 3-5 days a week.       Increase Strength and Stamina  Yes       Intervention  Provide advice, education, support and counseling about physical activity/exercise needs.;Develop an individualized exercise prescription for aerobic and resistive training based on initial evaluation findings, risk stratification, comorbidities and participant's personal goals.       Expected Outcomes  Short Term: Increase workloads from initial exercise prescription for resistance, speed, and METs.;Short Term:  Perform resistance training exercises routinely during rehab and add in resistance training at home;Long Term: Improve cardiorespiratory fitness, muscular endurance and strength as measured by increased METs and functional capacity (6MWT)       Able to understand and use rate of perceived exertion (RPE) scale  Yes       Intervention  Provide education and explanation on how to use RPE scale       Expected Outcomes  Long Term:  Able to use RPE to guide intensity level when exercising independently;Short Term: Able to use RPE daily in rehab to express subjective intensity level       Able to understand and use Dyspnea scale  Yes       Intervention  Provide education and explanation on how to use Dyspnea scale       Expected Outcomes  Short Term: Able to use Dyspnea scale daily in rehab to express subjective sense of shortness of breath during exertion;Long Term: Able to use Dyspnea scale to guide intensity level when exercising independently       Knowledge and understanding of Target Heart Rate Range (THRR)  Yes       Intervention  Provide education and explanation of THRR including how the numbers were predicted and where they are located for reference       Expected Outcomes  Short Term: Able to state/look up THRR;Short Term: Able to use daily as guideline for intensity in rehab;Long Term: Able to use THRR to govern intensity when exercising independently       Able to check pulse independently  Yes       Intervention  Provide education and demonstration on how to check pulse in carotid and radial arteries.;Review the importance  of being able to check your own pulse for safety during independent exercise       Expected Outcomes  Short Term: Able to explain why pulse checking is important during independent exercise;Long Term: Able to check pulse independently and accurately       Understanding of Exercise Prescription  Yes       Intervention  Provide education, explanation, and written materials on  patient's individual exercise prescription       Expected Outcomes  Short Term: Able to explain program exercise prescription;Long Term: Able to explain home exercise prescription to exercise independently          Exercise Goals Re-Evaluation :   Discharge Exercise Prescription (Final Exercise Prescription Changes): Exercise Prescription Changes - 10/05/19 1400      Response to Exercise   Blood Pressure (Admit)  112/64    Blood Pressure (Exercise)  146/64    Blood Pressure (Exit)  142/70    Heart Rate (Admit)  90 bpm    Heart Rate (Exercise)  125 bpm    Heart Rate (Exit)  95 bpm    Oxygen Saturation (Admit)  97 %    Oxygen Saturation (Exercise)  95 %    Oxygen Saturation (Exit)  98 %    Rating of Perceived Exertion (Exercise)  15    Perceived Dyspnea (Exercise)  3    Symptoms  hip pain 3/10, knee pain 3/10, SOB    Comments  walk test results       Nutrition:  Target Goals: Understanding of nutrition guidelines, daily intake of sodium <1545m, cholesterol <2058m calories 30% from fat and 7% or less from saturated fats, daily to have 5 or more servings of fruits and vegetables.  Biometrics: Pre Biometrics - 10/05/19 1455      Pre Biometrics   Height  5' 11.1" (1.806 m)    Weight  244 lb 11.2 oz (111 kg)    BMI (Calculated)  34.03    Single Leg Stand  3.12 seconds        Nutrition Therapy Plan and Nutrition Goals:   Nutrition Assessments: Nutrition Assessments - 10/05/19 1456      MEDFICTS Scores   Pre Score  77       Nutrition Goals Re-Evaluation:   Nutrition Goals Discharge (Final Nutrition Goals Re-Evaluation):   Psychosocial: Target Goals: Acknowledge presence or absence of significant depression and/or stress, maximize coping skills, provide positive support system. Participant is able to verbalize types and ability to use techniques and skills needed for reducing stress and depression.   Initial Review & Psychosocial Screening: Initial Psych Review  & Screening - 10/04/19 1037      Initial Review   Current issues with  Current Anxiety/Panic;Current Depression      Family Dynamics   Good Support System?  Yes    Comments  He can look to his wife, daughter, son and his church for support.      Barriers   Psychosocial barriers to participate in program  The patient should benefit from training in stress management and relaxation.      Screening Interventions   Interventions  Provide feedback about the scores to participant;Encouraged to exercise;Program counselor consult;To provide support and resources with identified psychosocial needs    Expected Outcomes  Long Term Goal: Stressors or current issues are controlled or eliminated.;Short Term goal: Utilizing psychosocial counselor, staff and physician to assist with identification of specific Stressors or current issues interfering with healing process. Setting desired goal  for each stressor or current issue identified.;Short Term goal: Identification and review with participant of any Quality of Life or Depression concerns found by scoring the questionnaire.;Long Term goal: The participant improves quality of Life and PHQ9 Scores as seen by post scores and/or verbalization of changes       Quality of Life Scores:   Scores of 19 and below usually indicate a poorer quality of life in these areas.  A difference of  2-3 points is a clinically meaningful difference.  A difference of 2-3 points in the total score of the Quality of Life Index has been associated with significant improvement in overall quality of life, self-image, physical symptoms, and general health in studies assessing change in quality of life.  PHQ-9: Recent Review Flowsheet Data    Depression screen [facility] 2/9 10/05/2019 02/18/2019 08/18/2018 02/25/2017 08/27/2016   Decreased Interest 1 0 1 0 3   Down, Depressed, Hopeless 3 0 0 0 3   PHQ - 2 Score 4 0 1 0 6   Altered sleeping 0 - 3 3 3    Tired, decreased energy 3 - 3 3 3     Change in appetite 0 - 0 2 1   Feeling bad or failure about yourself  0 - 0 1 0   Trouble concentrating 1 - 0 0 0   Moving slowly or fidgety/restless 3 - 0 0 0   Suicidal thoughts 0 - 0 0 0   PHQ-9 Score 11 - 7 9 13    Difficult doing work/chores Somewhat difficult - Not difficult at all - Very difficult     Interpretation of Total Score  Total Score Depression Severity:  1-4 = Minimal depression, 5-9 = Mild depression, 10-14 = Moderate depression, 15-19 = Moderately severe depression, 20-27 = Severe depression   Psychosocial Evaluation and Intervention: Psychosocial Evaluation - 10/04/19 1043      Psychosocial Evaluation & Interventions   Interventions  Encouraged to exercise with the program and follow exercise prescription    Comments  He can look to his wife, daughter, son and his church for support. He states he has some depression and gets short of breath easily. He gets short of breath easily and wants to be able to do more without getting so short of breath.    Expected Outcomes  Short: attend [facility] to decrease stress and improve SOB. Long: Exercise independently to keep stress at a minimum.    Continue Psychosocial Services   Follow up required by staff       Psychosocial Re-Evaluation:   Psychosocial Discharge (Final Psychosocial Re-Evaluation):   Vocational Rehabilitation: Provide vocational rehab assistance to qualifying candidates.   Vocational Rehab Evaluation & Intervention:   Education: Education Goals: Education classes will be provided on a variety of topics geared toward better understanding of heart health and risk factor modification. Participant will state understanding/return demonstration of topics presented as noted by education test scores.  Learning Barriers/Preferences: Learning Barriers/Preferences - 10/04/19 1039      Learning Barriers/Preferences   Learning Barriers  None    Learning Preferences  None       Education  Topics:  AED/CPR: - Group verbal and written instruction with the use of models to demonstrate the basic use of the AED with the basic ABC's of resuscitation.   General Nutrition Guidelines/Fats and Fiber: -Group instruction provided by verbal, written material, models and posters to present the general guidelines for heart healthy nutrition. Gives an explanation and review of dietary fats and  fiber.   Controlling Sodium/Reading Food Labels: -Group verbal and written material supporting the discussion of sodium use in heart healthy nutrition. Review and explanation with models, verbal and written materials for utilization of the food label.   Exercise Physiology & General Exercise Guidelines: - Group verbal and written instruction with models to review the exercise physiology of the cardiovascular system and associated critical values. Provides general exercise guidelines with specific guidelines to those with heart or lung disease.    Aerobic Exercise & Resistance Training: - Gives group verbal and written instruction on the various components of exercise. Focuses on aerobic and resistive training programs and the benefits of this training and how to safely progress through these programs..   Flexibility, Balance, Mind/Body Relaxation: Provides group verbal/written instruction on the benefits of flexibility and balance training, including mind/body exercise modes such as yoga, pilates and tai chi.  Demonstration and skill practice provided.   Stress and Anxiety: - Provides group verbal and written instruction about the health risks of elevated stress and causes of high stress.  Discuss the correlation between heart/lung disease and anxiety and treatment options. Review healthy ways to manage with stress and anxiety.   Depression: - Provides group verbal and written instruction on the correlation between heart/lung disease and depressed mood, treatment options, and the stigmas  associated with seeking treatment.   Anatomy & Physiology of the Heart: - Group verbal and written instruction and models provide basic cardiac anatomy and physiology, with the coronary electrical and arterial systems. Review of Valvular disease and Heart Failure   Cardiac Procedures: - Group verbal and written instruction to review commonly prescribed medications for heart disease. Reviews the medication, class of the drug, and side effects. Includes the steps to properly store meds and maintain the prescription regimen. (beta blockers and nitrates)   Cardiac Medications I: - Group verbal and written instruction to review commonly prescribed medications for heart disease. Reviews the medication, class of the drug, and side effects. Includes the steps to properly store meds and maintain the prescription regimen.   Cardiac Medications II: -Group verbal and written instruction to review commonly prescribed medications for heart disease. Reviews the medication, class of the drug, and side effects. (all other drug classes)    Go Sex-Intimacy & Heart Disease, Get SMART - Goal Setting: - Group verbal and written instruction through game format to discuss heart disease and the return to sexual intimacy. Provides group verbal and written material to discuss and apply goal setting through the application of the S.M.A.R.T. Method.   Other Matters of the Heart: - Provides group verbal, written materials and models to describe Stable Angina and Peripheral Artery. Includes description of the disease process and treatment options available to the cardiac patient.   Exercise & Equipment Safety: - Individual verbal instruction and demonstration of equipment use and safety with use of the equipment.   Pulmonary Rehab from 10/05/2019 in [facility] Cardiac and Pulmonary Rehab  Date  10/05/19  Educator  [facility]  Instruction Review Code  1- Verbalizes Understanding      Infection Prevention: - Provides verbal  and written material to individual with discussion of infection control including proper hand washing and proper equipment cleaning during exercise session.   Pulmonary Rehab from 10/05/2019 in [facility] Cardiac and Pulmonary Rehab  Date  10/05/19  Educator  [facility]  Instruction Review Code  1- Verbalizes Understanding      Falls Prevention: - Provides verbal and written material to individual with discussion of falls  prevention and safety.   Pulmonary Rehab from 10/05/2019 in [facility] Cardiac and Pulmonary Rehab  Date  10/04/19  Educator  [facility]  Instruction Review Code  1- Verbalizes Understanding      Diabetes: - Individual verbal and written instruction to review signs/symptoms of diabetes, desired ranges of glucose level fasting, after meals and with exercise. Acknowledge that pre and post exercise glucose checks will be done for 3 sessions at entry of program.   Know Your Numbers and Risk Factors: -Group verbal and written instruction about important numbers in your health.  Discussion of what are risk factors and how they play a role in the disease process.  Review of Cholesterol, Blood Pressure, Diabetes, and BMI and the role they play in your overall health.   Sleep Hygiene: -Provides group verbal and written instruction about how sleep can affect your health.  Define sleep hygiene, discuss sleep cycles and impact of sleep habits. Review good sleep hygiene tips.    Other: -Provides group and verbal instruction on various topics (see comments)   Knowledge Questionnaire Score: Knowledge Questionnaire Score - 10/05/19 1458      Knowledge Questionnaire Score   Pre Score  16/18 education focus: O2 safety, ADLs       Core Components/Risk Factors/Patient Goals at Admission: Personal Goals and Risk Factors at Admission - 10/05/19 1459      Core Components/Risk Factors/Patient Goals on Admission    Weight Management  Yes;Weight Loss    Intervention  Weight Management: Develop a combined  nutrition and exercise program designed to reach desired caloric intake, while maintaining appropriate intake of nutrient and fiber, sodium and fats, and appropriate energy expenditure required for the weight goal.;Weight Management: Provide education and appropriate resources to help participant work on and attain dietary goals.;Weight Management/Obesity: Establish reasonable short term and long term weight goals.;Obesity: Provide education and appropriate resources to help participant work on and attain dietary goals.    Admit Weight  244 lb 11.2 oz (111 kg)    Goal Weight: Short Term  235 lb (106.6 kg)    Goal Weight: Long Term  230 lb (104.3 kg)    Expected Outcomes  Short Term: Continue to assess and modify interventions until short term weight is achieved;Weight Maintenance: Understanding of the daily nutrition guidelines, which includes 25-35% calories from fat, 7% or less cal from saturated fats, less than 252m cholesterol, less than 1.5gm of sodium, & 5 or more servings of fruits and vegetables daily;Weight Loss: Understanding of general recommendations for a balanced deficit meal plan, which promotes 1-2 lb weight loss per week and includes a negative energy balance of [phone] kcal/d;Understanding recommendations for meals to include 15-35% energy as protein, 25-35% energy from fat, 35-60% energy from carbohydrates, less than 2034mof dietary cholesterol, 20-35 gm of total fiber daily;Understanding of distribution of calorie intake throughout the day with the consumption of 4-5 meals/snacks;Long Term: Adherence to nutrition and physical activity/exercise program aimed toward attainment of established weight goal    Tobacco Cessation  Yes    Intervention  Assist the participant in steps to quit. Provide individualized education and counseling about committing to Tobacco Cessation, relapse prevention, and pharmacological support that can be provided by physician.;OfAdvice workerassist  with locating and accessing local/national Quit Smoking programs, and support quit date choice.    Expected Outcomes  Short Term: Will demonstrate readiness to quit, by selecting a quit date.;Long Term: Complete abstinence from all tobacco products for at least 12 months from quit  date.;Short Term: Will quit all tobacco product use, adhering to prevention of relapse plan.    Improve shortness of breath with ADL's  Yes    Intervention  Provide education, individualized exercise plan and daily activity instruction to help decrease symptoms of SOB with activities of daily living.    Expected Outcomes  Short Term: Improve cardiorespiratory fitness to achieve a reduction of symptoms when performing ADLs;Long Term: Be able to perform more ADLs without symptoms or delay the onset of symptoms    Hypertension  Yes    Intervention  Provide education on lifestyle modifcations including regular physical activity/exercise, weight management, moderate sodium restriction and increased consumption of fresh fruit, vegetables, and low fat dairy, alcohol moderation, and smoking cessation.;Monitor prescription use compliance.    Expected Outcomes  Short Term: Continued assessment and intervention until BP is < 140/2m HG in hypertensive participants. < 130/813mHG in hypertensive participants with diabetes, heart failure or chronic kidney disease.;Long Term: Maintenance of blood pressure at goal levels.    Lipids  Yes    Intervention  Provide education and support for participant on nutrition & aerobic/resistive exercise along with prescribed medications to achieve LDL <7012mHDL >77m91m  Expected Outcomes  Short Term: Participant states understanding of desired cholesterol values and is compliant with medications prescribed. Participant is following exercise prescription and nutrition guidelines.;Long Term: Cholesterol controlled with medications as prescribed, with individualized exercise RX and with personalized  nutrition plan. Value goals: LDL < 70mg58mL > 40 mg.       Core Components/Risk Factors/Patient Goals Review:    Core Components/Risk Factors/Patient Goals at Discharge (Final Review):    ITP Comments: ITP Comments    Row Name 10/05/19 1232           ITP Comments  Completed 6MWT and gym orientation.  Initial ITP created and sent for review to Dr. Mark Emily Filbertical Director.          Comments: Initial ITP

## 2019-10-05 NOTE — Patient Instructions (Signed)
Patient Instructions  Patient Details  Name: Cody Day MRN: [id number] Date of Birth: [birth year] Referring Provider:  Flora Lipps, MD  Below are your personal goals for exercise, nutrition, and risk factors. Our goal is to help you stay on track towards obtaining and maintaining these goals. We will be discussing your progress on these goals with you throughout the program.  Initial Exercise Prescription: Initial Exercise Prescription - 10/05/19 1400      Date of Initial Exercise RX and Referring Provider   Date  10/05/19    Referring Provider  [name] MD      Treadmill   MPH  1.6    Grade  0.5    Minutes  15    METs  2.34      NuStep   Level  2    SPM  80    Minutes  15    METs  2      REL-XR   Level  1    Speed  50    Minutes  15    METs  2      T5 Nustep   Level  2    SPM  80    Minutes  15    METs  2      Prescription Details   Frequency (times per week)  3    Duration  Progress to 30 minutes of continuous aerobic without signs/symptoms of physical distress      Intensity   THRR 40-80% of Max Heartrate  117-144    Ratings of Perceived Exertion  11-13    Perceived Dyspnea  0-4      Progression   Progression  Continue to progress workloads to maintain intensity without signs/symptoms of physical distress.      Resistance Training   Training Prescription  Yes    Weight  3 lb    Reps  10-15       Exercise Goals: Frequency: Be able to perform aerobic exercise two to three times per week in program working toward 2-5 days per week of home exercise.  Intensity: Work with a perceived exertion of 11 (fairly light) - 15 (hard) while following your exercise prescription.  We will make changes to your prescription with you as you progress through the program.   Duration: Be able to do 30 to 45 minutes of continuous aerobic exercise in addition to a 5 minute warm-up and a 5 minute cool-down routine.   Nutrition Goals: Your personal nutrition goals  will be established when you do your nutrition analysis with the dietician.  The following are general nutrition guidelines to follow: Cholesterol < 200mg /day Sodium < 1500mg /day Fiber: Men over 50 yrs - 30 grams per day  Personal Goals: Personal Goals and Risk Factors at Admission - 10/05/19 1459      Core Components/Risk Factors/Patient Goals on Admission    Weight Management  Yes;Weight Loss    Intervention  Weight Management: Develop a combined nutrition and exercise program designed to reach desired caloric intake, while maintaining appropriate intake of nutrient and fiber, sodium and fats, and appropriate energy expenditure required for the weight goal.;Weight Management: Provide education and appropriate resources to help participant work on and attain dietary goals.;Weight Management/Obesity: Establish reasonable short term and long term weight goals.;Obesity: Provide education and appropriate resources to help participant work on and attain dietary goals.    Admit Weight  244 lb 11.2 oz (111 kg)    Goal Weight: Short Term  235 lb (106.6 kg)    Goal Weight: Long Term  230 lb (104.3 kg)    Expected Outcomes  Short Term: Continue to assess and modify interventions until short term weight is achieved;Weight Maintenance: Understanding of the daily nutrition guidelines, which includes 25-35% calories from fat, 7% or less cal from saturated fats, less than 200mg  cholesterol, less than 1.5gm of sodium, & 5 or more servings of fruits and vegetables daily;Weight Loss: Understanding of general recommendations for a balanced deficit meal plan, which promotes 1-2 lb weight loss per week and includes a negative energy balance of [phone] kcal/d;Understanding recommendations for meals to include 15-35% energy as protein, 25-35% energy from fat, 35-60% energy from carbohydrates, less than 200mg  of dietary cholesterol, 20-35 gm of total fiber daily;Understanding of distribution of calorie intake throughout  the day with the consumption of 4-5 meals/snacks;Long Term: Adherence to nutrition and physical activity/exercise program aimed toward attainment of established weight goal    Tobacco Cessation  Yes    Intervention  Assist the participant in steps to quit. Provide individualized education and counseling about committing to Tobacco Cessation, relapse prevention, and pharmacological support that can be provided by physician.;Advice worker, assist with locating and accessing local/national Quit Smoking programs, and support quit date choice.    Expected Outcomes  Short Term: Will demonstrate readiness to quit, by selecting a quit date.;Long Term: Complete abstinence from all tobacco products for at least 12 months from quit date.;Short Term: Will quit all tobacco product use, adhering to prevention of relapse plan.    Improve shortness of breath with ADL's  Yes    Intervention  Provide education, individualized exercise plan and daily activity instruction to help decrease symptoms of SOB with activities of daily living.    Expected Outcomes  Short Term: Improve cardiorespiratory fitness to achieve a reduction of symptoms when performing ADLs;Long Term: Be able to perform more ADLs without symptoms or delay the onset of symptoms    Hypertension  Yes    Intervention  Provide education on lifestyle modifcations including regular physical activity/exercise, weight management, moderate sodium restriction and increased consumption of fresh fruit, vegetables, and low fat dairy, alcohol moderation, and smoking cessation.;Monitor prescription use compliance.    Expected Outcomes  Short Term: Continued assessment and intervention until BP is < 140/97mm HG in hypertensive participants. < 130/57mm HG in hypertensive participants with diabetes, heart failure or chronic kidney disease.;Long Term: Maintenance of blood pressure at goal levels.    Lipids  Yes    Intervention  Provide education and support for  participant on nutrition & aerobic/resistive exercise along with prescribed medications to achieve LDL 70mg , HDL >40mg .    Expected Outcomes  Short Term: Participant states understanding of desired cholesterol values and is compliant with medications prescribed. Participant is following exercise prescription and nutrition guidelines.;Long Term: Cholesterol controlled with medications as prescribed, with individualized exercise RX and with personalized nutrition plan. Value goals: LDL < 70mg , HDL > 40 mg.       Tobacco Use Initial Evaluation: Social History   Tobacco Use  Smoking Status Former Smoker  . Packs/day: 0.25  . Years: 35.00  . Pack years: 8.75  . Types: Cigarettes  . Quit date: 10/22/1995  . Years since quitting: 23.9  Smokeless Tobacco Current User  . Types: Chew  Tobacco Comment   rarely smoked cigarettes and cigars.  states he smoked drugs mostly    Exercise Goals and Review: Exercise Goals    Row Name 10/05/19 1455  Exercise Goals   Increase Physical Activity  Yes       Intervention  Provide advice, education, support and counseling about physical activity/exercise needs.;Develop an individualized exercise prescription for aerobic and resistive training based on initial evaluation findings, risk stratification, comorbidities and participant's personal goals.       Expected Outcomes  Short Term: Attend rehab on a regular basis to increase amount of physical activity.;Long Term: Add in home exercise to make exercise part of routine and to increase amount of physical activity.;Long Term: Exercising regularly at least 3-5 days a week.       Increase Strength and Stamina  Yes       Intervention  Provide advice, education, support and counseling about physical activity/exercise needs.;Develop an individualized exercise prescription for aerobic and resistive training based on initial evaluation findings, risk stratification, comorbidities and participant's personal  goals.       Expected Outcomes  Short Term: Increase workloads from initial exercise prescription for resistance, speed, and METs.;Short Term: Perform resistance training exercises routinely during rehab and add in resistance training at home;Long Term: Improve cardiorespiratory fitness, muscular endurance and strength as measured by increased METs and functional capacity (6MWT)       Able to understand and use rate of perceived exertion (RPE) scale  Yes       Intervention  Provide education and explanation on how to use RPE scale       Expected Outcomes  Long Term:  Able to use RPE to guide intensity level when exercising independently;Short Term: Able to use RPE daily in rehab to express subjective intensity level       Able to understand and use Dyspnea scale  Yes       Intervention  Provide education and explanation on how to use Dyspnea scale       Expected Outcomes  Short Term: Able to use Dyspnea scale daily in rehab to express subjective sense of shortness of breath during exertion;Long Term: Able to use Dyspnea scale to guide intensity level when exercising independently       Knowledge and understanding of Target Heart Rate Range (THRR)  Yes       Intervention  Provide education and explanation of THRR including how the numbers were predicted and where they are located for reference       Expected Outcomes  Short Term: Able to state/look up THRR;Short Term: Able to use daily as guideline for intensity in rehab;Long Term: Able to use THRR to govern intensity when exercising independently       Able to check pulse independently  Yes       Intervention  Provide education and demonstration on how to check pulse in carotid and radial arteries.;Review the importance of being able to check your own pulse for safety during independent exercise       Expected Outcomes  Short Term: Able to explain why pulse checking is important during independent exercise;Long Term: Able to check pulse independently  and accurately       Understanding of Exercise Prescription  Yes       Intervention  Provide education, explanation, and written materials on patient's individual exercise prescription       Expected Outcomes  Short Term: Able to explain program exercise prescription;Long Term: Able to explain home exercise prescription to exercise independently          Copy of goals given to participant.

## 2019-10-06 ENCOUNTER — Other Ambulatory Visit: Payer: Self-pay

## 2019-10-06 ENCOUNTER — Encounter: Payer: Medicare Other | Admitting: *Deleted

## 2019-10-06 DIAGNOSIS — J4550 Severe persistent asthma, uncomplicated: Secondary | ICD-10-CM

## 2019-10-06 NOTE — Progress Notes (Signed)
Daily Session Note  Patient Details  Name: Cody Day MRN: [id number] Date of Birth: Feb 21, 1956 Referring Provider:     Pulmonary Rehab from 10/05/2019 in [facility]  Referring Provider  [name] MD      Encounter Date: 10/06/2019  Check In: Session Check In - 10/06/19 0854      Check-In   Supervising physician immediately available to respond to emergencies  See telemetry face sheet for immediately available ER MD    Location  [facility]    Staff Present  Heath Lark, RN, BSN, CCRP;Jeanna Durrell BS, Exercise Physiologist;Joseph Hood RCP,RRT,BSRT    Virtual Visit  No    Medication changes reported      No    Fall or balance concerns reported     No    Warm-up and Cool-down  Performed on first and last piece of equipment    Resistance Training Performed  Yes    VAD Patient?  No    PAD/SET Patient?  No      Pain Assessment   Currently in Pain?  No/denies          Social History   Tobacco Use  Smoking Status Former Smoker  . Packs/day: 0.25  . Years: 35.00  . Pack years: 8.75  . Types: Cigarettes  . Quit date: 10/22/1995  . Years since quitting: 23.9  Smokeless Tobacco Current User  . Types: Chew  Tobacco Comment   rarely smoked cigarettes and cigars.  states he smoked drugs mostly    Goals Met:  Exercise tolerated well Personal goals reviewed No report of cardiac concerns or symptoms  Goals Unmet:  Not Applicable  Comments: First full day of exercise!  Patient was oriented to gym and equipment including functions, settings, policies, and procedures.  Patient's individual exercise prescription and treatment plan were reviewed.  All starting workloads were established based on the results of the 6 minute walk test done at initial orientation visit.  The plan for exercise progression was also introduced and progression will be customized based on patient's performance and goals.    Dr. Emily Filbert is Medical  Director for [facility] and [facility].

## 2019-10-07 ENCOUNTER — Ambulatory Visit: Payer: Medicare Other | Admitting: Urology

## 2019-10-08 ENCOUNTER — Encounter: Payer: Medicare Other | Admitting: *Deleted

## 2019-10-08 ENCOUNTER — Other Ambulatory Visit: Payer: Self-pay

## 2019-10-08 ENCOUNTER — Encounter: Payer: Self-pay | Admitting: Primary Care

## 2019-10-08 ENCOUNTER — Other Ambulatory Visit: Payer: Self-pay | Admitting: Family Medicine

## 2019-10-08 ENCOUNTER — Ambulatory Visit (INDEPENDENT_AMBULATORY_CARE_PROVIDER_SITE_OTHER): Payer: Medicare Other | Admitting: Primary Care

## 2019-10-08 VITALS — BP 130/78 | HR 88 | Resp 16 | Ht 71.0 in | Wt 247.0 lb

## 2019-10-08 DIAGNOSIS — Z8669 Personal history of other diseases of the nervous system and sense organs: Secondary | ICD-10-CM

## 2019-10-08 DIAGNOSIS — J441 Chronic obstructive pulmonary disease with (acute) exacerbation: Secondary | ICD-10-CM

## 2019-10-08 DIAGNOSIS — J449 Chronic obstructive pulmonary disease, unspecified: Secondary | ICD-10-CM

## 2019-10-08 DIAGNOSIS — R42 Dizziness and giddiness: Secondary | ICD-10-CM

## 2019-10-08 DIAGNOSIS — J4550 Severe persistent asthma, uncomplicated: Secondary | ICD-10-CM | POA: Diagnosis not present

## 2019-10-08 DIAGNOSIS — R519 Headache, unspecified: Secondary | ICD-10-CM | POA: Diagnosis not present

## 2019-10-08 DIAGNOSIS — G4734 Idiopathic sleep related nonobstructive alveolar hypoventilation: Secondary | ICD-10-CM

## 2019-10-08 DIAGNOSIS — F1910 Other psychoactive substance abuse, uncomplicated: Secondary | ICD-10-CM

## 2019-10-08 NOTE — Patient Instructions (Addendum)
  Recommendations: - Continue Stiolto 2 puffs once daily - Continue Flovent two puffs twice daily - Continue Ventolin 2 puffs every 6 hours as needed - After PFTs completed may consider changing to Trelegy 200 and/or adding biologic injection to your therapy   Orders: - MRI brain wo contrast re: dizziness, headaches, tremor   Follow-up: - 4 weeks after echocardiogram and PFTs with Dr. Mortimer Fries or App

## 2019-10-08 NOTE — Progress Notes (Signed)
@Patient  ID: Cody Day, male    DOB: 20-Nov-1955, 63 y.o.   MRN: [id number]  Chief Complaint  Patient presents with  . Follow-up    pt here for f/u cxr/labs  . Shortness of Breath    pt still has sob with/without exertion. He has early am cough. Pt on Flovent and Stiolto.    Referring provider: [name], MD  HPI: 63 year old male, former smoker quit in 1997 (35 pack year hx). PMH significant for COPD, chronic asthma, allergic rhinitis, OSA (non-compliant with CPAP), nocturnal hypoxia, cardiomyopathy, CAD, HTN, polysubstance abuse, prediabetes, HLD. Former patient of Dr. Pennie Banter, now following with Dr. Mortimer Fries. Last seen by pulmonary NP on 10/01/19. Maintained on Stiolto respimat and Flovent hfa. Needs repeat PFTs and sleep eval in [facility].   He had a televisit on 09/20/19 and was treated empirically with course of steroids for dyspnea and shortness of breath. He returned on 10/01/19 for 1 week follow-up and was unable to complete walk test d/t shortness of breath, dizziness, tachycardia. Recommended ED eval d/t exertional chest pain, dyspnea and tachycardia. While in ED no baseline labs were drawn or CXR obtained.   10/08/2019 Patient presents today for follow-up visit. He still has shortness of breath with minimal activity. Experiences wheezing when lying down at night and associated chest tightness. Respiratory symptoms have improved in the past with steroids (reports three exacerbations this past year). He is using his ventolin inhaler twice daily. He has been attending pulmonary rehab for the last couple of weeks. Labs stable, Eos absolute 300. BNP normal and D-dimer negative. CXR showed no acute abnormalities. He has upcoming PFTs and echocardiogram scheduled. He is intolerant to cpap, wears nocturnal oxygen.   Additionally he experiences daily headaches, balance troubles and intermittent dizziness which are not new and have been ongoing for the last 6 months. States  that his gait is improving since rehab. He has difficulty retaining new information. He has a baseline tremor which he has seen neurology for in the past. He is taking celexa, dose increased to 40mg . Denies changes in speech or vision.    Significant testing: 01/10/2017-eosinophils relative 1, eosinophils absolute 0  07/20/2018-chest x-ray-lower lung volumes without acute chest process  03/23/2015-high-resolution CT chest-no findings of interstitial lung disease, no acute findings in the thorax, arthrosclerosis  07/21/2018-  echocardiogram-LV ejection fraction 123456, grade 1 diastolic dysfunction  99991111 3.4 (70% predicted), ratio 72, FEV1 2.5 (64% predicted)  03/23/2015-pulmonary function test-FVC 4.34 (87% predicted), postbronchodilator ratio 70, postbronchodilator FEV1 3.35 (88% predicted), positive bronchodilator response in FEV1, mid flow reversibility,, DLCO 41.19 (122% predicted)  12/22/2011-sleep study-AHI 79, SaO2 low 65%  Allergies  Allergen Reactions  . Levaquin [Levofloxacin In D5w]     Severe headaches, skin tingling/redness  . Penicillins Hives and Rash    Has patient had a PCN reaction causing immediate rash, facial/tongue/throat swelling, SOB or lightheadedness with hypotension: Yes Has patient had a PCN reaction causing severe rash involving mucus membranes or skin necrosis: Unknown Has patient had a PCN reaction that required hospitalization: No Has patient had a PCN reaction occurring within the last 10 years: No - Childhood reaction If all of the above answers are "NO", then may proceed with Cephalosporin use.     Immunization History  Administered Date(s) Administered  . Influenza Whole 11/21/2012  . Influenza,inj,Quad PF,6+ Mos 09/06/2014, 07/18/2015, 07/29/2016, 09/19/2017, 07/21/2018, 07/29/2019  . Pneumococcal Polysaccharide-23 10/23/2012  . Td 10/22/2003, 10/21/2009    Past Medical History:  Diagnosis Date  . Achilles tendon rupture 10/11    and repair  . Adenomatous polyp of colon 04/06/2010   Adenomatous polyps 3 in the descending colon, times one in the ascending colon [facility])  . Asthma   . COPD (chronic obstructive pulmonary disease) ([place])   . Gout   . Hemorrhoids   . Hypercholesteremia   . Hypertension   . Sleep apnea    doesn't wear CPAP  . Syncope 10/11   in settin gof asthma exacerbation w coughing. Ech (10/11): EF > 55%, mild LVH, grade I diastolic dysfunction, nomral RV size and systolic function,. normal valves. Carotid US (10/11): minimal disease    Tobacco History: Social History   Tobacco Use  Smoking Status Former Smoker  . Packs/day: 0.25  . Years: 35.00  . Pack years: 8.75  . Types: Cigarettes  . Quit date: 10/22/1995  . Years since quitting: 23.9  Smokeless Tobacco Current User  . Types: Chew  Tobacco Comment   rarely smoked cigarettes and cigars.  states he smoked drugs mostly   Ready to quit: Not Answered Counseling given: Not Answered Comment: rarely smoked cigarettes and cigars.  states he smoked drugs mostly   Outpatient Medications Prior to Visit  Medication Sig Dispense Refill  . albuterol (VENTOLIN HFA) 108 (90 Base) MCG/ACT inhaler Inhale 2 puffs into the lungs every 4 (four) hours as needed for wheezing or shortness of breath. 1 Inhaler 10  . allopurinol (ZYLOPRIM) 300 MG tablet TAKE 1 TABLET BY MOUTH DAILY 90 tablet 1  . citalopram (CELEXA) 40 MG tablet Take 1 tablet (40 mg total) by mouth daily. 30 tablet 5  . fluticasone (FLOVENT HFA) 220 MCG/ACT inhaler Inhale 2 puffs into the lungs 2 (two) times daily. 1 Inhaler 5  . lisinopril (ZESTRIL) 20 MG tablet TAKE 1 TABLET BY MOUTH DAILY 90 tablet 1  . montelukast (SINGULAIR) 10 MG tablet TAKE 1 TABLET BY MOUTH DAILY 90 tablet 1  . Multiple Vitamin (MULTIVITAMIN WITH MINERALS) TABS tablet Take 1 tablet by mouth daily. 30 tablet 2  . simvastatin (ZOCOR) 20 MG tablet TAKE 1 TABLET BY MOUTH DAILY 90 tablet 3  . testosterone  cypionate (DEPOTESTOSTERONE CYPIONATE) 200 MG/ML injection Inject 0.3 mLs (60 mg total) into the muscle once a week. 6 mL 0  . STIOLTO RESPIMAT 2.5-2.5 MCG/ACT AERS Use two inhalations every day 4 g 5   No facility-administered medications prior to visit.    Review of Systems  Review of Systems  Respiratory: Positive for chest tightness, shortness of breath and wheezing.   Neurological: Positive for dizziness, tremors and headaches.  Psychiatric/Behavioral: The patient is nervous/anxious.    Physical Exam  BP 130/78 (BP Location: Left Arm, Cuff Size: Large)   Pulse 88   Resp 16   Ht 5\' 11"  (1.803 m)   Wt 247 lb (112 kg)   SpO2 98%   BMI 34.45 kg/m  Physical Exam Constitutional:      General: He is not in acute distress.    Appearance: He is well-developed. He is not toxic-appearing.  HENT:     Head: Normocephalic and atraumatic.     Mouth/Throat:     Comments: Deferred d/t masking Cardiovascular:     Rate and Rhythm: Normal rate and regular rhythm.  Pulmonary:     Effort: Pulmonary effort is normal.     Breath sounds: Normal breath sounds. No wheezing or rhonchi.  Musculoskeletal:        General: Normal range of motion.  Skin:    General: Skin is warm and dry.  Neurological:     General: No focal deficit present.     Mental Status: He is alert and oriented to person, place, and time.     Comments: +BUE tremor; neuro exam grossly intact  Psychiatric:        Mood and Affect: Mood normal.        Behavior: Behavior normal.      Lab Results:  CBC    Component Value Date/Time   WBC 8.6 10/04/2019 1137   RBC 6.28 (H) 10/04/2019 1137   HGB 13.0 10/04/2019 1137   HGB 13.5 10/23/2012 1346   HCT 43.5 10/04/2019 1137   HCT 44.6 05/12/2019 0840   PLT 321 10/04/2019 1137   PLT 257 10/23/2012 1346   MCV 69.3 (L) 10/04/2019 1137   MCV 72 (L) 10/23/2012 1346   MCH 20.7 (L) 10/04/2019 1137   MCHC 29.9 (L) 10/04/2019 1137   RDW 18.8 (H) 10/04/2019 1137   RDW 17.2 (H)  10/23/2012 1346   LYMPHSABS 2.0 10/04/2019 1137   MONOABS 0.8 10/04/2019 1137   EOSABS 0.3 10/04/2019 1137   BASOSABS 0.1 10/04/2019 1137    BMET    Component Value Date/Time   NA 134 (L) 10/04/2019 1137   NA 136 10/23/2012 1346   K 3.8 10/04/2019 1137   K 3.4 (L) 10/23/2012 1346   CL 100 10/04/2019 1137   CL 99 10/23/2012 1346   CO2 24 10/04/2019 1137   CO2 23 10/23/2012 1346   GLUCOSE 115 (H) 10/04/2019 1137   GLUCOSE 112 (H) 10/23/2012 1346   BUN 12 10/04/2019 1137   BUN 13 10/23/2012 1346   CREATININE 0.65 10/04/2019 1137   CREATININE 1.07 10/23/2012 1346   CALCIUM 9.0 10/04/2019 1137   CALCIUM 9.3 10/23/2012 1346   GFRNONAA >60 10/04/2019 1137   GFRNONAA >60 10/23/2012 1346   GFRAA >60 10/04/2019 1137   GFRAA >60 10/23/2012 1346    BNP    Component Value Date/Time   BNP 34.0 10/04/2019 1137    ProBNP No results found for: PROBNP  Imaging: CXR 2view  Result Date: 10/01/2019 CLINICAL DATA:  Shortness of breath EXAM: CHEST - 2 VIEW COMPARISON:  07/20/2018 FINDINGS: Normal heart size, mediastinal contours, and pulmonary vascularity. Lungs clear. No pulmonary infiltrate, pleural effusion or pneumothorax. Bones unremarkable. IMPRESSION: No acute abnormalities. Electronically Signed   By: Lavonia Dana M.D.   On: 10/01/2019 18:01     Assessment & Plan:   COPD exacerbation ([place]) - COPD with likely asthmatic component (reports 3 exacerbations requiring oral steroids this year) - Eosinophil absolute 300, CXR no acute abnormalities  - Continue Stiolto 2 puffs once daily - Continue Flovent hfa 222mcg two puffs twice daily - Continue Ventolin hfa 2 puffs every 6 hours as needed sob/wheezing - After PFTs completed may consider changing to Trelegy 200 and/or adding biologic - FU 4 weeks after echocardiogram and PFTs with Dr. Mortimer Fries or App   Nocturnal hypoxia - Intolerant to CPAP - Continue 2L nocturnal oxygen, renew order with DME   Dizziness - Ongoing for 6 months  with associated daily HA - Neuro assessment grossly intact except for known tremor  - Ordering MRI wo contrast - Declined neurology referral- if abnormal imaging agreeing to return   Polysubstance abuse [facility]) - Denies current ETOH use   Martyn Ehrich, NP 10/10/2019

## 2019-10-08 NOTE — Progress Notes (Signed)
Daily Session Note  Patient Details  Name: Cody Day MRN: [id number] Date of Birth: 1956/06/13 Referring Provider:     Pulmonary Rehab from 10/05/2019 in [facility] Cardiac and Pulmonary Rehab  Referring Provider  [name] MD      Encounter Date: 10/08/2019  Check In: Session Check In - 10/08/19 0918      Check-In   Supervising physician immediately available to respond to emergencies  See telemetry face sheet for immediately available ER MD    Location  [facility]-Cardiac & Pulmonary Rehab    Staff Present  Heath Lark, RN, BSN, CCRP;Joseph Foy [place], [place], ACSM CEP, Exercise Physiologist    Virtual Visit  No    Medication changes reported      No    Fall or balance concerns reported     No    Warm-up and Cool-down  Performed on first and last piece of equipment    Resistance Training Performed  Yes    VAD Patient?  No    PAD/SET Patient?  No      Pain Assessment   Currently in Pain?  No/denies          Social History   Tobacco Use  Smoking Status Former Smoker  . Packs/day: 0.25  . Years: 35.00  . Pack years: 8.75  . Types: Cigarettes  . Quit date: 10/22/1995  . Years since quitting: 23.9  Smokeless Tobacco Current User  . Types: Chew  Tobacco Comment   rarely smoked cigarettes and cigars.  states he smoked drugs mostly    Goals Met:  Exercise tolerated well Personal goals reviewed No report of cardiac concerns or symptoms  Goals Unmet:  Not Applicable  Comments: First full day of exercise!  Patient was oriented to gym and equipment including functions, settings, policies, and procedures.  Patient's individual exercise prescription and treatment plan were reviewed.  All starting workloads were established based on the results of the 6 minute walk test done at initial orientation visit.  The plan for exercise progression was also introduced and progression will be customized based on patient's performance and goals.    Dr. Emily Filbert is  Medical Director for [place] and [facility].

## 2019-10-10 ENCOUNTER — Encounter: Payer: Self-pay | Admitting: Primary Care

## 2019-10-10 NOTE — Assessment & Plan Note (Signed)
-   Ongoing for 6 months with associated daily HA - Neuro assessment grossly intact except for known tremor  - Ordering MRI wo contrast - Declined neurology referral- if abnormal imaging agreeing to return

## 2019-10-10 NOTE — Assessment & Plan Note (Signed)
-   Intolerant to CPAP - Continue 2L nocturnal oxygen, renew order with DME

## 2019-10-10 NOTE — Assessment & Plan Note (Signed)
-   Denies current ETOH use

## 2019-10-10 NOTE — Assessment & Plan Note (Addendum)
-   COPD with likely asthmatic component (reports 3 exacerbations requiring oral steroids this year) - Eosinophil absolute 300, CXR no acute abnormalities  - Continue Stiolto 2 puffs once daily - Continue Flovent hfa 243mcg two puffs twice daily - Continue Ventolin hfa 2 puffs every 6 hours as needed sob/wheezing - After PFTs completed may consider changing to Trelegy 200 and/or adding biologic - FU 4 weeks after echocardiogram and PFTs with Dr. Mortimer Fries or App

## 2019-10-11 ENCOUNTER — Encounter: Payer: Medicare Other | Admitting: *Deleted

## 2019-10-11 ENCOUNTER — Other Ambulatory Visit: Payer: Self-pay

## 2019-10-11 ENCOUNTER — Telehealth: Payer: Self-pay

## 2019-10-11 DIAGNOSIS — J4550 Severe persistent asthma, uncomplicated: Secondary | ICD-10-CM | POA: Diagnosis not present

## 2019-10-11 DIAGNOSIS — J449 Chronic obstructive pulmonary disease, unspecified: Secondary | ICD-10-CM

## 2019-10-11 NOTE — Telephone Encounter (Signed)
ONO has been ordered. Will route to [facility] to make aware.

## 2019-10-11 NOTE — Telephone Encounter (Signed)
Just to verify, would you like this ONO on room air or 1L?

## 2019-10-11 NOTE — Telephone Encounter (Signed)
Room air

## 2019-10-11 NOTE — Telephone Encounter (Signed)
Please order ONO, thank you

## 2019-10-11 NOTE — Progress Notes (Signed)
Daily Session Note  Patient Details  Name: Cody Day MRN: [id number] Date of Birth: 1955/11/25 Referring Provider:     Pulmonary Rehab from 10/05/2019 in [facility] Cardiac and Pulmonary Rehab  Referring Provider  [name] MD      Encounter Date: 10/11/2019  Check In: Session Check In - 10/11/19 0817      Check-In   Supervising physician immediately available to respond to emergencies  See telemetry face sheet for immediately available ER MD    Location  [facility]-Cardiac & Pulmonary Rehab    Staff Present  Heath Lark, RN, BSN, CCRP;Joseph Hood RCP,RRT,BSRT;Kelly [place], [place], ACSM CEP, Exercise Physiologist    Virtual Visit  No    Medication changes reported      No    Fall or balance concerns reported     No    Warm-up and Cool-down  Performed on first and last piece of equipment    Resistance Training Performed  Yes    VAD Patient?  No    PAD/SET Patient?  No      Pain Assessment   Currently in Pain?  No/denies          Social History   Tobacco Use  Smoking Status Former Smoker  . Packs/day: 0.25  . Years: 35.00  . Pack years: 8.75  . Types: Cigarettes  . Quit date: 10/22/1995  . Years since quitting: 23.9  Smokeless Tobacco Current User  . Types: Chew  Tobacco Comment   rarely smoked cigarettes and cigars.  states he smoked drugs mostly    Goals Met:  Independence with exercise equipment Exercise tolerated well No report of cardiac concerns or symptoms  Goals Unmet:  Not Applicable  Comments: Pt able to follow exercise prescription today without complaint.  Will continue to monitor for progression.    Dr. Emily Filbert is Medical Director for [place] and [facility].

## 2019-10-11 NOTE — Telephone Encounter (Signed)
-----   Message from Catha Gosselin sent at 10/11/2019  1:54 PM EST ----- Regarding: ONO on RA needed to re-qualify pt for 02 at night See below from [facility] regarding re-certification on above patient.  Mikey Bussing, Christal  Cobb, [place] J We do need an order for an ONO in order to re-certify this patient plesae      Please place order for ONO on RA.  Thanks, Suanne Marker

## 2019-10-11 NOTE — Telephone Encounter (Signed)
Per [facility], ONO is needed to re qualify pt for night time oxygen.  Beth, please advise. Thanks

## 2019-10-12 ENCOUNTER — Other Ambulatory Visit: Payer: Self-pay

## 2019-10-12 ENCOUNTER — Ambulatory Visit
Admission: RE | Admit: 2019-10-12 | Discharge: 2019-10-12 | Disposition: A | Discharge status: Home / Self Care | Payer: Medicare Other | Source: Ambulatory Visit | Attending: Pulmonary Disease | Admitting: Pulmonary Disease

## 2019-10-12 DIAGNOSIS — I517 Cardiomegaly: Secondary | ICD-10-CM | POA: Insufficient documentation

## 2019-10-12 DIAGNOSIS — J449 Chronic obstructive pulmonary disease, unspecified: Secondary | ICD-10-CM | POA: Diagnosis not present

## 2019-10-12 DIAGNOSIS — R0600 Dyspnea, unspecified: Secondary | ICD-10-CM | POA: Diagnosis not present

## 2019-10-12 NOTE — Telephone Encounter (Signed)
Called and LMOVM that an ONO on RA has been ordered and sent to [facility] to arrange.  This is  needed to re-qualify patient for 02 at night. Advised patient that if he had any questions, to contact me at [phone].  Order sent to [facility]. Nothing else needed at this time. Rhonda J Cobb

## 2019-10-12 NOTE — Progress Notes (Signed)
*  PRELIMINARY RESULTS* Echocardiogram 2D Echocardiogram has been performed.  Sherrie Sport 10/12/2019, 11:31 AM

## 2019-10-13 ENCOUNTER — Telehealth: Payer: Self-pay | Admitting: Family Medicine

## 2019-10-13 DIAGNOSIS — E559 Vitamin D deficiency, unspecified: Secondary | ICD-10-CM

## 2019-10-13 NOTE — Telephone Encounter (Signed)
Mr. Helou notified as instructed by telephone.  Lab appointment scheduled for 10/18/2019 at 10:05 am to check Vit D level.  Will forward back to place future lab order.

## 2019-10-13 NOTE — Telephone Encounter (Signed)
Pt is wanting to know why his Vit D3 refill was refused.   Please advise, thanks.

## 2019-10-13 NOTE — Telephone Encounter (Signed)
Vit D is not on his current medication list.  Last office visit 08/31/2019 for 6 month follow up.  Last Vit D level ??  Please advise if patient is suppose to be on Vit D.

## 2019-10-13 NOTE — Telephone Encounter (Signed)
Agree.. no recent vit D level, not on med list.  Needs to have rechecked... can take OTC vit D3 1000 daily until labs can be done.

## 2019-10-14 ENCOUNTER — Encounter: Payer: Medicare Other | Admitting: *Deleted

## 2019-10-14 ENCOUNTER — Other Ambulatory Visit: Payer: Self-pay

## 2019-10-14 DIAGNOSIS — J4550 Severe persistent asthma, uncomplicated: Secondary | ICD-10-CM | POA: Diagnosis not present

## 2019-10-14 NOTE — Addendum Note (Signed)
Addended by: Eliezer Lofts E on: 10/14/2019 12:42 PM   Modules accepted: Orders

## 2019-10-14 NOTE — Progress Notes (Signed)
Daily Session Note  Patient Details  Name: Cody Day MRN: [id number] Date of Birth: Jun 01, 1956 Referring Provider:     Pulmonary Rehab from 10/05/2019 in [facility] Cardiac and Pulmonary Rehab  Referring Provider  [name] MD      Encounter Date: 10/14/2019  Check In: Session Check In - 10/14/19 0813      Check-In   Supervising physician immediately available to respond to emergencies  See telemetry face sheet for immediately available ER MD    Location  [facility]-Cardiac & Pulmonary Rehab    Staff Present  Heath Lark, RN, BSN, CCRP;Jessica Parsons, MA, RCEP, CCRP, CCET;Jeanna Durrell BS, Exercise Physiologist    Virtual Visit  No    Medication changes reported      No    Fall or balance concerns reported     No    Warm-up and Cool-down  Performed on first and last piece of equipment    Resistance Training Performed  Yes    VAD Patient?  No    PAD/SET Patient?  No      Pain Assessment   Currently in Pain?  No/denies          Social History   Tobacco Use  Smoking Status Former Smoker  . Packs/day: 0.25  . Years: 35.00  . Pack years: 8.75  . Types: Cigarettes  . Quit date: 10/22/1995  . Years since quitting: 23.9  Smokeless Tobacco Current User  . Types: Chew  Tobacco Comment   rarely smoked cigarettes and cigars.  states he smoked drugs mostly    Goals Met:  Independence with exercise equipment Exercise tolerated well No report of cardiac concerns or symptoms  Goals Unmet:  Not Applicable  Comments: Pt able to follow exercise prescription today without complaint.  Will continue to monitor for progression.    Dr. Emily Filbert is Medical Director for [place] and [facility].

## 2019-10-18 ENCOUNTER — Other Ambulatory Visit (INDEPENDENT_AMBULATORY_CARE_PROVIDER_SITE_OTHER): Payer: Medicare Other

## 2019-10-18 ENCOUNTER — Other Ambulatory Visit: Payer: Self-pay

## 2019-10-18 ENCOUNTER — Encounter: Payer: Medicare Other | Admitting: *Deleted

## 2019-10-18 DIAGNOSIS — J4550 Severe persistent asthma, uncomplicated: Secondary | ICD-10-CM | POA: Diagnosis not present

## 2019-10-18 DIAGNOSIS — E559 Vitamin D deficiency, unspecified: Secondary | ICD-10-CM

## 2019-10-18 LAB — VITAMIN D 25 HYDROXY (VIT D DEFICIENCY, FRACTURES): VITD: 63.5 ng/mL (ref 30.00–100.00)

## 2019-10-18 NOTE — Progress Notes (Signed)
Daily Session Note  Patient Details  Name: Cody Day MRN: [id number] Date of Birth: [birth year] Referring Provider:     Pulmonary Rehab from 10/05/2019 in [facility] Cardiac and Pulmonary Rehab  Referring Provider  [name] MD      Encounter Date: 10/18/2019  Check In: Session Check In - 10/18/19 0826      Check-In   Supervising physician immediately available to respond to emergencies  See telemetry face sheet for immediately available ER MD    Location  [facility]-Cardiac & Pulmonary Rehab    Staff Present  Heath Lark, RN, BSN, CCRP;Amanda Sommer, BA, ACSM CEP, Exercise Physiologist;Joseph Hood RCP,RRT,BSRT    Virtual Visit  No    Medication changes reported      No    Fall or balance concerns reported     No    Warm-up and Cool-down  Performed on first and last piece of equipment    Resistance Training Performed  No    VAD Patient?  No    PAD/SET Patient?  No      Pain Assessment   Currently in Pain?  No/denies          Social History   Tobacco Use  Smoking Status Former Smoker  . Packs/day: 0.25  . Years: 35.00  . Pack years: 8.75  . Types: Cigarettes  . Quit date: 10/22/1995  . Years since quitting: 24.0  Smokeless Tobacco Current User  . Types: Chew  Tobacco Comment   rarely smoked cigarettes and cigars.  states he smoked drugs mostly    Goals Met:  Independence with exercise equipment Exercise tolerated well No report of cardiac concerns or symptoms  Goals Unmet:  Not Applicable  Comments: Pt able to follow exercise prescription today without complaint.  Will continue to monitor for progression.    Dr. Emily Filbert is Medical Director for [place] and [facility].

## 2019-10-19 ENCOUNTER — Ambulatory Visit
Admission: RE | Admit: 2019-10-19 | Discharge: 2019-10-19 | Disposition: A | Discharge status: Home / Self Care | Payer: Medicare Other | Source: Ambulatory Visit | Attending: Primary Care | Admitting: Primary Care

## 2019-10-19 DIAGNOSIS — R519 Headache, unspecified: Secondary | ICD-10-CM | POA: Diagnosis present

## 2019-10-19 DIAGNOSIS — R42 Dizziness and giddiness: Secondary | ICD-10-CM | POA: Diagnosis present

## 2019-10-19 NOTE — Progress Notes (Signed)
Please let patient know Brain MRI is normal.

## 2019-10-20 ENCOUNTER — Encounter: Payer: Self-pay | Admitting: *Deleted

## 2019-10-20 DIAGNOSIS — J4550 Severe persistent asthma, uncomplicated: Secondary | ICD-10-CM

## 2019-10-20 NOTE — Progress Notes (Signed)
Pulmonary Individual Treatment Plan  Patient Details  Name: Cody Day MRN: [id number] Date of Birth: 02-Mar-1956 Referring Provider:     Pulmonary Rehab from 10/05/2019 in [facility] Cardiac and Pulmonary Rehab  Referring Provider  [name] MD      Initial Encounter Date:    Pulmonary Rehab from 10/05/2019 in [facility] Cardiac and Pulmonary Rehab  Date  10/05/19      Visit Diagnosis: Severe persistent chronic asthma without complication  Patient's Home Medications on Admission:  Current Outpatient Medications:  .  albuterol (VENTOLIN HFA) 108 (90 Base) MCG/ACT inhaler, Inhale 2 puffs into the lungs every 4 (four) hours as needed for wheezing or shortness of breath., Disp: 1 Inhaler, Rfl: 10 .  allopurinol (ZYLOPRIM) 300 MG tablet, TAKE 1 TABLET BY MOUTH DAILY, Disp: 90 tablet, Rfl: 1 .  citalopram (CELEXA) 40 MG tablet, Take 1 tablet (40 mg total) by mouth daily., Disp: 30 tablet, Rfl: 5 .  fluticasone (FLOVENT HFA) 220 MCG/ACT inhaler, Inhale 2 puffs into the lungs 2 (two) times daily., Disp: 1 Inhaler, Rfl: 5 .  lisinopril (ZESTRIL) 20 MG tablet, TAKE 1 TABLET BY MOUTH DAILY, Disp: 90 tablet, Rfl: 1 .  montelukast (SINGULAIR) 10 MG tablet, TAKE 1 TABLET BY MOUTH DAILY, Disp: 90 tablet, Rfl: 1 .  Multiple Vitamin (MULTIVITAMIN WITH MINERALS) TABS tablet, Take 1 tablet by mouth daily., Disp: 30 tablet, Rfl: 2 .  simvastatin (ZOCOR) 20 MG tablet, TAKE 1 TABLET BY MOUTH DAILY, Disp: 90 tablet, Rfl: 3 .  STIOLTO RESPIMAT 2.5-2.5 MCG/ACT AERS, USE 2 INHALATIONS EVERY DAY, Disp: 4 g, Rfl: 5 .  testosterone cypionate (DEPOTESTOSTERONE CYPIONATE) 200 MG/ML injection, Inject 0.3 mLs (60 mg total) into the muscle once a week., Disp: 6 mL, Rfl: 0  Past Medical History: Past Medical History:  Diagnosis Date  . Achilles tendon rupture 10/11   and repair  . Adenomatous polyp of colon 04/06/2010   Adenomatous polyps 3 in the descending colon, times one in the ascending colon [facility])  .  Asthma   . COPD (chronic obstructive pulmonary disease) ([place])   . Gout   . Hemorrhoids   . Hypercholesteremia   . Hypertension   . Sleep apnea    doesn't wear CPAP  . Syncope 10/11   in settin gof asthma exacerbation w coughing. Ech (10/11): EF > 55%, mild LVH, grade I diastolic dysfunction, nomral RV size and systolic function,. normal valves. Carotid US (10/11): minimal disease    Tobacco Use: Social History   Tobacco Use  Smoking Status Former Smoker  . Packs/day: 0.25  . Years: 35.00  . Pack years: 8.75  . Types: Cigarettes  . Quit date: 10/22/1995  . Years since quitting: 24.0  Smokeless Tobacco Current User  . Types: Chew  Tobacco Comment   rarely smoked cigarettes and cigars.  states he smoked drugs mostly    Labs: Recent Review Flowsheet Data    Labs for ITP Cardiac and Pulmonary Rehab Latest Ref Rng & Units 08/23/2016 04/01/2018 07/20/2018 02/15/2019 08/30/2019   Cholestrol 0 - 200 mg/dL 174 168 - 136 123   LDLCALC 0 - 99 mg/dL - 75 - 71 57   LDLDIRECT mg/dL 53.0 - - - -   HDL >39.00 mg/dL 97.10 81.60 - 44.50 51.00   Trlycerides 0.0 - 149.0 mg/dL 257.0(H) 58.0 - 103.0 76.0   Hemoglobin A1c 4.6 - 6.5 % 6.2 6.1 5.7(H) 6.8(H) 6.3   TCO2 0 - 100 mmol/L - - - - -  Pulmonary Assessment Scores: Pulmonary Assessment Scores    Row Name 10/05/19 1501         ADL UCSD   ADL Phase  Entry     SOB Score total  79     Rest  1     Walk  3     Stairs  4     Bath  5     Dress  4     Shop  4       CAT Score   CAT Score  25       mMRC Score   mMRC Score  4        UCSD: Self-administered rating of dyspnea associated with activities of daily living (ADLs) 6-point scale (0 = "not at all" to 5 = "maximal or unable to do because of breathlessness")  Scoring Scores range from 0 to 120.  Minimally important difference is 5 units  CAT: CAT can identify the health impairment of COPD patients and is better correlated with disease progression.  CAT has a scoring range  of zero to 40. The CAT score is classified into four groups of low (less than 10), medium (10 - 20), high (21-30) and very high (31-40) based on the impact level of disease on health status. A CAT score over 10 suggests significant symptoms.  A worsening CAT score could be explained by an exacerbation, poor medication adherence, poor inhaler technique, or progression of COPD or comorbid conditions.  CAT MCID is 2 points  mMRC: mMRC (Modified Medical Research Council) Dyspnea Scale is used to assess the degree of baseline functional disability in patients of respiratory disease due to dyspnea. No minimal important difference is established. A decrease in score of 1 point or greater is considered a positive change.   Pulmonary Function Assessment:   Exercise Target Goals: Exercise Program Goal: Individual exercise prescription set using results from initial 6 min walk test and THRR while considering  patient's activity barriers and safety.   Exercise Prescription Goal: Initial exercise prescription builds to 30-45 minutes a day of aerobic activity, 2-3 days per week.  Home exercise guidelines will be given to patient during program as part of exercise prescription that the participant will acknowledge.  Activity Barriers & Risk Stratification: Activity Barriers & Cardiac Risk Stratification - 10/05/19 1451      Activity Barriers & Cardiac Risk Stratification   Activity Barriers  Deconditioning;Muscular Weakness;Other (comment);Shortness of Breath;Balance Concerns    Comments  shuffles feet, tremors       6 Minute Walk: 6 Minute Walk    Row Name 10/05/19 1441         6 Minute Walk   Phase  Initial     Distance  994 feet     Walk Time  6 minutes     # of Rest Breaks  0     MPH  1.88     METS  2.82     RPE  15     Perceived Dyspnea   3     VO2 Peak  9.88     Symptoms  Yes (comment)     Comments  hip pain 3/10, SOB, knee pain 3/10, SOB     Resting HR  90 bpm     Resting BP   112/64     Resting Oxygen Saturation   97 %     Exercise Oxygen Saturation  during 6 min walk  95 %     Max  Ex. HR  125 bpm     Max Ex. BP  146/64     2 Minute Post BP  134/70       Interval HR   1 Minute HR  99     2 Minute HR  119     3 Minute HR  114     4 Minute HR  102     5 Minute HR  125     6 Minute HR  98     2 Minute Post HR  92     Interval Heart Rate?  Yes       Interval Oxygen   Interval Oxygen?  Yes     Baseline Oxygen Saturation %  97 %     1 Minute Oxygen Saturation %  96 %     1 Minute Liters of Oxygen  0 L Room Air     2 Minute Oxygen Saturation %  95 %     2 Minute Liters of Oxygen  0 L     3 Minute Oxygen Saturation %  96 %     3 Minute Liters of Oxygen  0 L     4 Minute Oxygen Saturation %  95 %     4 Minute Liters of Oxygen  0 L     5 Minute Oxygen Saturation %  95 %     5 Minute Liters of Oxygen  0 L     6 Minute Oxygen Saturation %  96 %     6 Minute Liters of Oxygen  0 L     2 Minute Post Oxygen Saturation %  97 %     2 Minute Post Liters of Oxygen  0 L       Oxygen Initial Assessment: Oxygen Initial Assessment - 10/04/19 1036      Home Oxygen   Home Oxygen Device  Home Concentrator    Sleep Oxygen Prescription  Continuous    Liters per minute  2    Home Exercise Oxygen Prescription  None    Home at Rest Exercise Oxygen Prescription  None    Compliance with Home Oxygen Use  Yes      Initial 6 min Walk   Oxygen Used  None      Program Oxygen Prescription   Program Oxygen Prescription  None      Intervention   Short Term Goals  To learn and exhibit compliance with exercise, home and travel O2 prescription;To learn and understand importance of maintaining oxygen saturations>88%;To learn and demonstrate proper use of respiratory medications;To learn and demonstrate proper pursed lip breathing techniques or other breathing techniques.;To learn and understand importance of monitoring SPO2 with pulse oximeter and demonstrate accurate use of the  pulse oximeter.    Long  Term Goals  Verbalizes importance of monitoring SPO2 with pulse oximeter and return demonstration;Exhibits proper breathing techniques, such as pursed lip breathing or other method taught during program session;Demonstrates proper use of MDI's;Exhibits compliance with exercise, home and travel O2 prescription;Maintenance of O2 saturations>88%;Compliance with respiratory medication       Oxygen Re-Evaluation: Oxygen Re-Evaluation    Row Name 10/06/19 0858             Program Oxygen Prescription   Program Oxygen Prescription  None         Home Oxygen   Home Oxygen Device  Home Concentrator       Sleep Oxygen Prescription  Continuous  Liters per minute  2       Home Exercise Oxygen Prescription  None       Home at Rest Exercise Oxygen Prescription  None       Compliance with Home Oxygen Use  Yes         Goals/Expected Outcomes   Short Term Goals  To learn and understand importance of monitoring SPO2 with pulse oximeter and demonstrate accurate use of the pulse oximeter.;To learn and understand importance of maintaining oxygen saturations>88%;To learn and demonstrate proper pursed lip breathing techniques or other breathing techniques.       Long  Term Goals  Verbalizes importance of monitoring SPO2 with pulse oximeter and return demonstration;Maintenance of O2 saturations>88%;Exhibits proper breathing techniques, such as pursed lip breathing or other method taught during program session       Comments  Reviewed PLB technique with pt.  Talked about how it works and it's importance in maintaining their exercise saturations.       Goals/Expected Outcomes  Short: Become more profiecient at using PLB.   Long: Become independent at using PLB.          Oxygen Discharge (Final Oxygen Re-Evaluation): Oxygen Re-Evaluation - 10/06/19 0858      Program Oxygen Prescription   Program Oxygen Prescription  None      Home Oxygen   Home Oxygen Device  Home  Concentrator    Sleep Oxygen Prescription  Continuous    Liters per minute  2    Home Exercise Oxygen Prescription  None    Home at Rest Exercise Oxygen Prescription  None    Compliance with Home Oxygen Use  Yes      Goals/Expected Outcomes   Short Term Goals  To learn and understand importance of monitoring SPO2 with pulse oximeter and demonstrate accurate use of the pulse oximeter.;To learn and understand importance of maintaining oxygen saturations>88%;To learn and demonstrate proper pursed lip breathing techniques or other breathing techniques.    Long  Term Goals  Verbalizes importance of monitoring SPO2 with pulse oximeter and return demonstration;Maintenance of O2 saturations>88%;Exhibits proper breathing techniques, such as pursed lip breathing or other method taught during program session    Comments  Reviewed PLB technique with pt.  Talked about how it works and it's importance in maintaining their exercise saturations.    Goals/Expected Outcomes  Short: Become more profiecient at using PLB.   Long: Become independent at using PLB.       Initial Exercise Prescription: Initial Exercise Prescription - 10/05/19 1400      Date of Initial Exercise RX and Referring Provider   Date  10/05/19    Referring Provider  [name] MD      Treadmill   MPH  1.6    Grade  0.5    Minutes  15    METs  2.34      NuStep   Level  2    SPM  80    Minutes  15    METs  2      REL-XR   Level  1    Speed  50    Minutes  15    METs  2      T5 Nustep   Level  2    SPM  80    Minutes  15    METs  2      Prescription Details   Frequency (times per week)  3    Duration  Progress  to 30 minutes of continuous aerobic without signs/symptoms of physical distress      Intensity   THRR 40-80% of Max Heartrate  117-144    Ratings of Perceived Exertion  11-13    Perceived Dyspnea  0-4      Progression   Progression  Continue to progress workloads to maintain intensity without  signs/symptoms of physical distress.      Resistance Training   Training Prescription  Yes    Weight  3 lb    Reps  10-15       Perform Capillary Blood Glucose checks as needed.  Exercise Prescription Changes: Exercise Prescription Changes    Row Name 10/05/19 1400             Response to Exercise   Blood Pressure (Admit)  112/64       Blood Pressure (Exercise)  146/64       Blood Pressure (Exit)  142/70       Heart Rate (Admit)  90 bpm       Heart Rate (Exercise)  125 bpm       Heart Rate (Exit)  95 bpm       Oxygen Saturation (Admit)  97 %       Oxygen Saturation (Exercise)  95 %       Oxygen Saturation (Exit)  98 %       Rating of Perceived Exertion (Exercise)  15       Perceived Dyspnea (Exercise)  3       Symptoms  hip pain 3/10, knee pain 3/10, SOB       Comments  walk test results          Exercise Comments: Exercise Comments    Row Name 10/06/19 0856 10/08/19 0920         Exercise Comments  First full day of exercise!  Patient was oriented to gym and equipment including functions, settings, policies, and procedures.  Patient's individual exercise prescription and treatment plan were reviewed.  All starting workloads were established based on the results of the 6 minute walk test done at initial orientation visit.  The plan for exercise progression was also introduced and progression will be customized based on patient's performance and goals.  First full day of exercise!  Patient was oriented to gym and equipment including functions, settings, policies, and procedures.  Patient's individual exercise prescription and treatment plan were reviewed.  All starting workloads were established based on the results of the 6 minute walk test done at initial orientation visit.  The plan for exercise progression was also introduced and progression will be customized based on patient's performance and goals.         Exercise Goals and Review: Exercise Goals    Row Name  10/05/19 1455             Exercise Goals   Increase Physical Activity  Yes       Intervention  Provide advice, education, support and counseling about physical activity/exercise needs.;Develop an individualized exercise prescription for aerobic and resistive training based on initial evaluation findings, risk stratification, comorbidities and participant's personal goals.       Expected Outcomes  Short Term: Attend rehab on a regular basis to increase amount of physical activity.;Long Term: Add in home exercise to make exercise part of routine and to increase amount of physical activity.;Long Term: Exercising regularly at least 3-5 days a week.       Increase Strength and Stamina  Yes       Intervention  Provide advice, education, support and counseling about physical activity/exercise needs.;Develop an individualized exercise prescription for aerobic and resistive training based on initial evaluation findings, risk stratification, comorbidities and participant's personal goals.       Expected Outcomes  Short Term: Increase workloads from initial exercise prescription for resistance, speed, and METs.;Short Term: Perform resistance training exercises routinely during rehab and add in resistance training at home;Long Term: Improve cardiorespiratory fitness, muscular endurance and strength as measured by increased METs and functional capacity (6MWT)       Able to understand and use rate of perceived exertion (RPE) scale  Yes       Intervention  Provide education and explanation on how to use RPE scale       Expected Outcomes  Long Term:  Able to use RPE to guide intensity level when exercising independently;Short Term: Able to use RPE daily in rehab to express subjective intensity level       Able to understand and use Dyspnea scale  Yes       Intervention  Provide education and explanation on how to use Dyspnea scale       Expected Outcomes  Short Term: Able to use Dyspnea scale daily in rehab to  express subjective sense of shortness of breath during exertion;Long Term: Able to use Dyspnea scale to guide intensity level when exercising independently       Knowledge and understanding of Target Heart Rate Range (THRR)  Yes       Intervention  Provide education and explanation of THRR including how the numbers were predicted and where they are located for reference       Expected Outcomes  Short Term: Able to state/look up THRR;Short Term: Able to use daily as guideline for intensity in rehab;Long Term: Able to use THRR to govern intensity when exercising independently       Able to check pulse independently  Yes       Intervention  Provide education and demonstration on how to check pulse in carotid and radial arteries.;Review the importance of being able to check your own pulse for safety during independent exercise       Expected Outcomes  Short Term: Able to explain why pulse checking is important during independent exercise;Long Term: Able to check pulse independently and accurately       Understanding of Exercise Prescription  Yes       Intervention  Provide education, explanation, and written materials on patient's individual exercise prescription       Expected Outcomes  Short Term: Able to explain program exercise prescription;Long Term: Able to explain home exercise prescription to exercise independently          Exercise Goals Re-Evaluation : Exercise Goals Re-Evaluation    Row Name 10/06/19 0857 10/08/19 0920           Exercise Goal Re-Evaluation   Exercise Goals Review  Able to understand and use rate of perceived exertion (RPE) scale;Able to understand and use Dyspnea scale;Knowledge and understanding of Target Heart Rate Range (THRR);Understanding of Exercise Prescription  Able to understand and use rate of perceived exertion (RPE) scale;Knowledge and understanding of Target Heart Rate Range (THRR);Understanding of Exercise Prescription      Comments  Reviewed RPE and  dyspnea scale, THR and program prescription with pt today.  Pt voiced understanding and was given a copy of goals to take home.  Reviewed RPE scale, THR and program prescription  with pt today.  Pt voiced understanding and was given a copy of goals to take home.      Expected Outcomes  Short: Use RPE and dyspnea scales daily to regulate intensity. Long: Follow program prescription in THR.  Short: Use RPE daily to regulate intensity. Long: Follow program prescription in THR.         Discharge Exercise Prescription (Final Exercise Prescription Changes): Exercise Prescription Changes - 10/05/19 1400      Response to Exercise   Blood Pressure (Admit)  112/64    Blood Pressure (Exercise)  146/64    Blood Pressure (Exit)  142/70    Heart Rate (Admit)  90 bpm    Heart Rate (Exercise)  125 bpm    Heart Rate (Exit)  95 bpm    Oxygen Saturation (Admit)  97 %    Oxygen Saturation (Exercise)  95 %    Oxygen Saturation (Exit)  98 %    Rating of Perceived Exertion (Exercise)  15    Perceived Dyspnea (Exercise)  3    Symptoms  hip pain 3/10, knee pain 3/10, SOB    Comments  walk test results       Nutrition:  Target Goals: Understanding of nutrition guidelines, daily intake of sodium <1512m, cholesterol <2053m calories 30% from fat and 7% or less from saturated fats, daily to have 5 or more servings of fruits and vegetables.  Biometrics: Pre Biometrics - 10/05/19 1455      Pre Biometrics   Height  5' 11.1" (1.806 m)    Weight  244 lb 11.2 oz (111 kg)    BMI (Calculated)  34.03    Single Leg Stand  3.12 seconds        Nutrition Therapy Plan and Nutrition Goals:   Nutrition Assessments: Nutrition Assessments - 10/05/19 1456      MEDFICTS Scores   Pre Score  77       Nutrition Goals Re-Evaluation:   Nutrition Goals Discharge (Final Nutrition Goals Re-Evaluation):   Psychosocial: Target Goals: Acknowledge presence or absence of significant depression and/or stress, maximize  coping skills, provide positive support system. Participant is able to verbalize types and ability to use techniques and skills needed for reducing stress and depression.   Initial Review & Psychosocial Screening: Initial Psych Review & Screening - 10/04/19 1037      Initial Review   Current issues with  Current Anxiety/Panic;Current Depression      Family Dynamics   Good Support System?  Yes    Comments  He can look to his wife, daughter, son and his church for support.      Barriers   Psychosocial barriers to participate in program  The patient should benefit from training in stress management and relaxation.      Screening Interventions   Interventions  Provide feedback about the scores to participant;Encouraged to exercise;Program counselor consult;To provide support and resources with identified psychosocial needs    Expected Outcomes  Long Term Goal: Stressors or current issues are controlled or eliminated.;Short Term goal: Utilizing psychosocial counselor, staff and physician to assist with identification of specific Stressors or current issues interfering with healing process. Setting desired goal for each stressor or current issue identified.;Short Term goal: Identification and review with participant of any Quality of Life or Depression concerns found by scoring the questionnaire.;Long Term goal: The participant improves quality of Life and PHQ9 Scores as seen by post scores and/or verbalization of changes       Quality  of Life Scores:  Scores of 19 and below usually indicate a poorer quality of life in these areas.  A difference of  2-3 points is a clinically meaningful difference.  A difference of 2-3 points in the total score of the Quality of Life Index has been associated with significant improvement in overall quality of life, self-image, physical symptoms, and general health in studies assessing change in quality of life.  PHQ-9: Recent Review Flowsheet Data    Depression  screen [facility] 2/9 10/05/2019 02/18/2019 08/18/2018 02/25/2017 08/27/2016   Decreased Interest 1 0 1 0 3   Down, Depressed, Hopeless 3 0 0 0 3   PHQ - 2 Score 4 0 1 0 6   Altered sleeping 0 - 3 3 3    Tired, decreased energy 3 - 3 3 3    Change in appetite 0 - 0 2 1   Feeling bad or failure about yourself  0 - 0 1 0   Trouble concentrating 1 - 0 0 0   Moving slowly or fidgety/restless 3 - 0 0 0   Suicidal thoughts 0 - 0 0 0   PHQ-9 Score 11 - 7 9 13    Difficult doing work/chores Somewhat difficult - Not difficult at all - Very difficult     Interpretation of Total Score  Total Score Depression Severity:  1-4 = Minimal depression, 5-9 = Mild depression, 10-14 = Moderate depression, 15-19 = Moderately severe depression, 20-27 = Severe depression   Psychosocial Evaluation and Intervention: Psychosocial Evaluation - 10/04/19 1043      Psychosocial Evaluation & Interventions   Interventions  Encouraged to exercise with the program and follow exercise prescription    Comments  He can look to his wife, daughter, son and his church for support. He states he has some depression and gets short of breath easily. He gets short of breath easily and wants to be able to do more without getting so short of breath.    Expected Outcomes  Short: attend [facility] to decrease stress and improve SOB. Long: Exercise independently to keep stress at a minimum.    Continue Psychosocial Services   Follow up required by staff       Psychosocial Re-Evaluation:   Psychosocial Discharge (Final Psychosocial Re-Evaluation):   Education: Education Goals: Education classes will be provided on a weekly basis, covering required topics. Participant will state understanding/return demonstration of topics presented.  Learning Barriers/Preferences: Learning Barriers/Preferences - 10/04/19 1039      Learning Barriers/Preferences   Learning Barriers  None    Learning Preferences  None       Education Topics:  Initial  Evaluation Education: - Verbal, written and demonstration of respiratory meds, oximetry and breathing techniques. Instruction on use of nebulizers and MDIs and importance of monitoring MDI activations.   Pulmonary Rehab from 10/05/2019 in [facility] Cardiac and Pulmonary Rehab  Date  10/04/19  Educator  [facility]  Instruction Review Code  1- Verbalizes Understanding      General Nutrition Guidelines/Fats and Fiber: -Group instruction provided by verbal, written material, models and posters to present the general guidelines for heart healthy nutrition. Gives an explanation and review of dietary fats and fiber.   Controlling Sodium/Reading Food Labels: -Group verbal and written material supporting the discussion of sodium use in heart healthy nutrition. Review and explanation with models, verbal and written materials for utilization of the food label.   Exercise Physiology & General Exercise Guidelines: - Group verbal and written instruction with models to review  the exercise physiology of the cardiovascular system and associated critical values. Provides general exercise guidelines with specific guidelines to those with heart or lung disease.    Aerobic Exercise & Resistance Training: - Gives group verbal and written instruction on the various components of exercise. Focuses on aerobic and resistive training programs and the benefits of this training and how to safely progress through these programs.   Flexibility, Balance, Mind/Body Relaxation: Provides group verbal/written instruction on the benefits of flexibility and balance training, including mind/body exercise modes such as yoga, pilates and tai chi.  Demonstration and skill practice provided.   Stress and Anxiety: - Provides group verbal and written instruction about the health risks of elevated stress and causes of high stress.  Discuss the correlation between heart/lung disease and anxiety and treatment options. Review healthy ways to  manage with stress and anxiety.   Depression: - Provides group verbal and written instruction on the correlation between heart/lung disease and depressed mood, treatment options, and the stigmas associated with seeking treatment.   Exercise & Equipment Safety: - Individual verbal instruction and demonstration of equipment use and safety with use of the equipment.   Pulmonary Rehab from 10/05/2019 in [facility] Cardiac and Pulmonary Rehab  Date  10/05/19  Educator  [facility]  Instruction Review Code  1- Verbalizes Understanding      Infection Prevention: - Provides verbal and written material to individual with discussion of infection control including proper hand washing and proper equipment cleaning during exercise session.   Pulmonary Rehab from 10/05/2019 in [facility] Cardiac and Pulmonary Rehab  Date  10/05/19  Educator  [facility]  Instruction Review Code  1- Verbalizes Understanding      Falls Prevention: - Provides verbal and written material to individual with discussion of falls prevention and safety.   Pulmonary Rehab from 10/05/2019 in [facility] Cardiac and Pulmonary Rehab  Date  10/04/19  Educator  [facility]  Instruction Review Code  1- Verbalizes Understanding      Diabetes: - Individual verbal and written instruction to review signs/symptoms of diabetes, desired ranges of glucose level fasting, after meals and with exercise. Advice that pre and post exercise glucose checks will be done for 3 sessions at entry of program.   Chronic Lung Diseases: - Group verbal and written instruction to review updates, respiratory medications, advancements in procedures and treatments. Discuss use of supplemental oxygen including available portable oxygen systems, continuous and intermittent flow rates, concentrators, personal use and safety guidelines. Review proper use of inhaler and spacers. Provide informative websites for self-education.    Energy Conservation: - Provide group verbal and written instruction  for methods to conserve energy, plan and organize activities. Instruct on pacing techniques, use of adaptive equipment and posture/positioning to relieve shortness of breath.   Triggers and Exacerbations: - Group verbal and written instruction to review types of environmental triggers and ways to prevent exacerbations. Discuss weather changes, air quality and the benefits of nasal washing. Review warning signs and symptoms to help prevent infections. Discuss techniques for effective airway clearance, coughing, and vibrations.   AED/CPR: - Group verbal and written instruction with the use of models to demonstrate the basic use of the AED with the basic ABC's of resuscitation.   Anatomy and Physiology of the Lungs: - Group verbal and written instruction with the use of models to provide basic lung anatomy and physiology related to function, structure and complications of lung disease.   Anatomy & Physiology of the Heart: - Group verbal and written instruction  and models provide basic cardiac anatomy and physiology, with the coronary electrical and arterial systems. Review of Valvular disease and Heart Failure   Cardiac Medications: - Group verbal and written instruction to review commonly prescribed medications for heart disease. Reviews the medication, class of the drug, and side effects.   Know Your Numbers and Risk Factors: -Group verbal and written instruction about important numbers in your health.  Discussion of what are risk factors and how they play a role in the disease process.  Review of Cholesterol, Blood Pressure, Diabetes, and BMI and the role they play in your overall health.   Sleep Hygiene: -Provides group verbal and written instruction about how sleep can affect your health.  Define sleep hygiene, discuss sleep cycles and impact of sleep habits. Review good sleep hygiene tips.    Other: -Provides group and verbal instruction on various topics (see  comments)    Knowledge Questionnaire Score: Knowledge Questionnaire Score - 10/05/19 1458      Knowledge Questionnaire Score   Pre Score  16/18 education focus: O2 safety, ADLs        Core Components/Risk Factors/Patient Goals at Admission: Personal Goals and Risk Factors at Admission - 10/05/19 1459      Core Components/Risk Factors/Patient Goals on Admission    Weight Management  Yes;Weight Loss    Intervention  Weight Management: Develop a combined nutrition and exercise program designed to reach desired caloric intake, while maintaining appropriate intake of nutrient and fiber, sodium and fats, and appropriate energy expenditure required for the weight goal.;Weight Management: Provide education and appropriate resources to help participant work on and attain dietary goals.;Weight Management/Obesity: Establish reasonable short term and long term weight goals.;Obesity: Provide education and appropriate resources to help participant work on and attain dietary goals.    Admit Weight  244 lb 11.2 oz (111 kg)    Goal Weight: Short Term  235 lb (106.6 kg)    Goal Weight: Long Term  230 lb (104.3 kg)    Expected Outcomes  Short Term: Continue to assess and modify interventions until short term weight is achieved;Weight Maintenance: Understanding of the daily nutrition guidelines, which includes 25-35% calories from fat, 7% or less cal from saturated fats, less than 270m cholesterol, less than 1.5gm of sodium, & 5 or more servings of fruits and vegetables daily;Weight Loss: Understanding of general recommendations for a balanced deficit meal plan, which promotes 1-2 lb weight loss per week and includes a negative energy balance of [id number] kcal/d;Understanding recommendations for meals to include 15-35% energy as protein, 25-35% energy from fat, 35-60% energy from carbohydrates, less than 2064mof dietary cholesterol, 20-35 gm of total fiber daily;Understanding of distribution of calorie intake  throughout the day with the consumption of 4-5 meals/snacks;Long Term: Adherence to nutrition and physical activity/exercise program aimed toward attainment of established weight goal    Tobacco Cessation  Yes    Intervention  Assist the participant in steps to quit. Provide individualized education and counseling about committing to Tobacco Cessation, relapse prevention, and pharmacological support that can be provided by physician.;OfAdvice workerassist with locating and accessing local/national Quit Smoking programs, and support quit date choice.    Expected Outcomes  Short Term: Will demonstrate readiness to quit, by selecting a quit date.;Long Term: Complete abstinence from all tobacco products for at least 12 months from quit date.;Short Term: Will quit all tobacco product use, adhering to prevention of relapse plan.    Improve shortness of breath with ADL's  Yes  Intervention  Provide education, individualized exercise plan and daily activity instruction to help decrease symptoms of SOB with activities of daily living.    Expected Outcomes  Short Term: Improve cardiorespiratory fitness to achieve a reduction of symptoms when performing ADLs;Long Term: Be able to perform more ADLs without symptoms or delay the onset of symptoms    Hypertension  Yes    Intervention  Provide education on lifestyle modifcations including regular physical activity/exercise, weight management, moderate sodium restriction and increased consumption of fresh fruit, vegetables, and low fat dairy, alcohol moderation, and smoking cessation.;Monitor prescription use compliance.    Expected Outcomes  Short Term: Continued assessment and intervention until BP is < 140/72m HG in hypertensive participants. < 130/864mHG in hypertensive participants with diabetes, heart failure or chronic kidney disease.;Long Term: Maintenance of blood pressure at goal levels.    Lipids  Yes    Intervention  Provide education and  support for participant on nutrition & aerobic/resistive exercise along with prescribed medications to achieve LDL <7045mHDL >82m36m  Expected Outcomes  Short Term: Participant states understanding of desired cholesterol values and is compliant with medications prescribed. Participant is following exercise prescription and nutrition guidelines.;Long Term: Cholesterol controlled with medications as prescribed, with individualized exercise RX and with personalized nutrition plan. Value goals: LDL < 70mg82mL > 40 mg.       Core Components/Risk Factors/Patient Goals Review:    Core Components/Risk Factors/Patient Goals at Discharge (Final Review):    ITP Comments: ITP Comments    Row Name 10/05/19 1232 10/06/19 0856 10/08/19 0919 10/20/19 0912     ITP Comments  Completed 6MWT and gym orientation.  Initial ITP created and sent for review to Dr. Mark Emily Filbertical Director.  First full day of exercise!  Patient was oriented to gym and equipment including functions, settings, policies, and procedures.  Patient's individual exercise prescription and treatment plan were reviewed.  All starting workloads were established based on the results of the 6 minute walk test done at initial orientation visit.  The plan for exercise progression was also introduced and progression will be customized based on patient's performance and goals.  First full day of exercise!  Patient was oriented to gym and equipment including functions, settings, policies, and procedures.  Patient's individual exercise prescription and treatment plan were reviewed.  All starting workloads were established based on the results of the 6 minute walk test done at initial orientation visit.  The plan for exercise progression was also introduced and progression will be customized based on patient's performance and goals.  30 day review competed . ITP sent to Dr Mark Emily Fil[place], changes as needed and ITP approval signature  New tp  program       Comments:

## 2019-10-21 NOTE — Telephone Encounter (Signed)
Pt is aware of results and voiced his understanding.  Just to verify, would you like for pt to continue at 1L?

## 2019-10-21 NOTE — Telephone Encounter (Signed)
Yes

## 2019-10-21 NOTE — Telephone Encounter (Signed)
Patient re-qualified for nocturnal oxygen. Please renew order with DME company  ONO 10/13/19 showed O2 <88% for 21mins. SpO2 low 70%.

## 2019-10-21 NOTE — Addendum Note (Signed)
Addended by: Maryanna Shape A on: 10/21/2019 11:56 AM   Modules accepted: Orders

## 2019-10-21 NOTE — Telephone Encounter (Signed)
Order has been placed to [facility] for 1L QHS. Nothing further is needed.

## 2019-10-21 NOTE — Addendum Note (Signed)
Addended by: Maryanna Shape A on: 10/21/2019 11:52 AM   Modules accepted: Orders

## 2019-10-25 NOTE — Progress Notes (Signed)
ATC na nor vmail

## 2019-10-27 ENCOUNTER — Other Ambulatory Visit: Payer: Medicare Other

## 2019-10-28 ENCOUNTER — Other Ambulatory Visit: Payer: Self-pay

## 2019-10-28 ENCOUNTER — Encounter: Payer: Medicare Other | Attending: Internal Medicine | Admitting: *Deleted

## 2019-10-28 ENCOUNTER — Ambulatory Visit: Payer: Medicare Other

## 2019-10-28 DIAGNOSIS — Z79899 Other long term (current) drug therapy: Secondary | ICD-10-CM | POA: Insufficient documentation

## 2019-10-28 DIAGNOSIS — E7800 Pure hypercholesterolemia, unspecified: Secondary | ICD-10-CM | POA: Insufficient documentation

## 2019-10-28 DIAGNOSIS — G4730 Sleep apnea, unspecified: Secondary | ICD-10-CM | POA: Diagnosis not present

## 2019-10-28 DIAGNOSIS — I10 Essential (primary) hypertension: Secondary | ICD-10-CM | POA: Insufficient documentation

## 2019-10-28 DIAGNOSIS — J449 Chronic obstructive pulmonary disease, unspecified: Secondary | ICD-10-CM | POA: Insufficient documentation

## 2019-10-28 DIAGNOSIS — Z87891 Personal history of nicotine dependence: Secondary | ICD-10-CM | POA: Insufficient documentation

## 2019-10-28 DIAGNOSIS — J4550 Severe persistent asthma, uncomplicated: Secondary | ICD-10-CM | POA: Diagnosis not present

## 2019-10-28 NOTE — Progress Notes (Signed)
Daily Session Note  Patient Details  Name: Cody Day MRN: [id number] Date of Birth: 31-Jul-1956 Referring Provider:     Pulmonary Rehab from 10/05/2019 in [facility] Cardiac and Pulmonary Rehab  Referring Provider  [name] MD      Encounter Date: 10/28/2019  Check In: Session Check In - 10/28/19 0820      Check-In   Supervising physician immediately available to respond to emergencies  See telemetry face sheet for immediately available ER MD    Location  [facility]-Cardiac & Pulmonary Rehab    Staff Present  Heath Lark, RN, BSN, CCRP;Joseph Hood RCP,RRT,BSRT;Jessica [place], [place], [place], [place], CCET    Virtual Visit  No    Medication changes reported      No    Fall or balance concerns reported     No    Warm-up and Cool-down  Performed on first and last piece of equipment    Resistance Training Performed  Yes    VAD Patient?  No    PAD/SET Patient?  No      Pain Assessment   Currently in Pain?  No/denies          Social History   Tobacco Use  Smoking Status Former Smoker  . Packs/day: 0.25  . Years: 35.00  . Pack years: 8.75  . Types: Cigarettes  . Quit date: 10/22/1995  . Years since quitting: 24.0  Smokeless Tobacco Current User  . Types: Chew  Tobacco Comment   rarely smoked cigarettes and cigars.  states he smoked drugs mostly    Goals Met:  Independence with exercise equipment Exercise tolerated well No report of cardiac concerns or symptoms  Goals Unmet:  Not Applicable  Comments: Pt able to follow exercise prescription today without complaint.  Will continue to monitor for progression.    Dr. Emily Filbert is Medical Director for [place] and [facility].

## 2019-11-01 ENCOUNTER — Encounter: Payer: Medicare Other | Admitting: *Deleted

## 2019-11-01 ENCOUNTER — Other Ambulatory Visit: Payer: Self-pay

## 2019-11-01 DIAGNOSIS — G4730 Sleep apnea, unspecified: Secondary | ICD-10-CM | POA: Diagnosis not present

## 2019-11-01 DIAGNOSIS — Z79899 Other long term (current) drug therapy: Secondary | ICD-10-CM | POA: Diagnosis not present

## 2019-11-01 DIAGNOSIS — J4550 Severe persistent asthma, uncomplicated: Secondary | ICD-10-CM

## 2019-11-01 DIAGNOSIS — J449 Chronic obstructive pulmonary disease, unspecified: Secondary | ICD-10-CM | POA: Diagnosis not present

## 2019-11-01 DIAGNOSIS — I10 Essential (primary) hypertension: Secondary | ICD-10-CM | POA: Diagnosis not present

## 2019-11-01 DIAGNOSIS — E7800 Pure hypercholesterolemia, unspecified: Secondary | ICD-10-CM | POA: Diagnosis not present

## 2019-11-01 NOTE — Progress Notes (Signed)
Daily Session Note  Patient Details  Name: Cody Day MRN: [id number] Date of Birth: 1956-02-25 Referring Provider:     Pulmonary Rehab from 10/05/2019 in [facility] Cardiac and Pulmonary Rehab  Referring Provider  [name] MD      Encounter Date: 11/01/2019  Check In: Session Check In - 11/01/19 0849      Check-In   Supervising physician immediately available to respond to emergencies  See telemetry face sheet for immediately available ER MD    Location  [facility]-Cardiac & Pulmonary Rehab    Staff Present  Heath Lark, RN, BSN, CCRP;Joseph Hood RCP,RRT,BSRT;Kelly [place], BS, ACSM CEP, Exercise Physiologist;Jessica [place], [place], RCEP, CCRP, CCET    Virtual Visit  No    Medication changes reported      No    Fall or balance concerns reported     No    Warm-up and Cool-down  Performed on first and last piece of equipment    Resistance Training Performed  Yes    VAD Patient?  No    PAD/SET Patient?  No      Pain Assessment   Currently in Pain?  No/denies          Social History   Tobacco Use  Smoking Status Former Smoker  . Packs/day: 0.25  . Years: 35.00  . Pack years: 8.75  . Types: Cigarettes  . Quit date: 10/22/1995  . Years since quitting: 24.0  Smokeless Tobacco Current User  . Types: Chew  Tobacco Comment   rarely smoked cigarettes and cigars.  states he smoked drugs mostly    Goals Met:  Independence with exercise equipment Exercise tolerated well No report of cardiac concerns or symptoms  Goals Unmet:  Not Applicable  Comments: Pt able to follow exercise prescription today without complaint.  Will continue to monitor for progression.    Dr. Emily Filbert is Medical Director for [place] and [facility].

## 2019-11-03 ENCOUNTER — Encounter: Payer: Self-pay | Admitting: Emergency Medicine

## 2019-11-03 ENCOUNTER — Emergency Department
Admission: EM | Admit: 2019-11-03 | Discharge: 2019-11-03 | Disposition: A | Discharge status: Home / Self Care | Payer: Medicare Other | Attending: Student | Admitting: Student

## 2019-11-03 ENCOUNTER — Telehealth: Payer: Self-pay

## 2019-11-03 ENCOUNTER — Other Ambulatory Visit: Payer: Self-pay

## 2019-11-03 DIAGNOSIS — J449 Chronic obstructive pulmonary disease, unspecified: Secondary | ICD-10-CM | POA: Diagnosis not present

## 2019-11-03 DIAGNOSIS — Z79899 Other long term (current) drug therapy: Secondary | ICD-10-CM | POA: Diagnosis not present

## 2019-11-03 DIAGNOSIS — F17220 Nicotine dependence, chewing tobacco, uncomplicated: Secondary | ICD-10-CM | POA: Insufficient documentation

## 2019-11-03 DIAGNOSIS — I10 Essential (primary) hypertension: Secondary | ICD-10-CM | POA: Diagnosis not present

## 2019-11-03 DIAGNOSIS — R591 Generalized enlarged lymph nodes: Secondary | ICD-10-CM | POA: Insufficient documentation

## 2019-11-03 DIAGNOSIS — R070 Pain in throat: Secondary | ICD-10-CM | POA: Diagnosis present

## 2019-11-03 DIAGNOSIS — J45909 Unspecified asthma, uncomplicated: Secondary | ICD-10-CM | POA: Diagnosis not present

## 2019-11-03 LAB — COMPREHENSIVE METABOLIC PANEL
ALT: 21 U/L (ref 0–44)
AST: 27 U/L (ref 15–41)
Albumin: 4 g/dL (ref 3.5–5.0)
Alkaline Phosphatase: 58 U/L (ref 38–126)
Anion gap: 9 (ref 5–15)
BUN: 13 mg/dL (ref 8–23)
CO2: 27 mmol/L (ref 22–32)
Calcium: 9.3 mg/dL (ref 8.9–10.3)
Chloride: 97 mmol/L — ABNORMAL LOW (ref 98–111)
Creatinine, Ser: 0.75 mg/dL (ref 0.61–1.24)
GFR calc Af Amer: >60 mL/min (ref >60)
GFR calc non Af Amer: >60 mL/min (ref >60)
Glucose, Bld: 145 mg/dL — ABNORMAL HIGH (ref 70–99)
Potassium: 3.7 mmol/L (ref 3.5–5.1)
Sodium: 133 mmol/L — ABNORMAL LOW (ref 135–145)
Total Bilirubin: 1.8 mg/dL — ABNORMAL HIGH (ref 0.3–1.2)
Total Protein: 6.8 g/dL (ref 6.5–8.1)

## 2019-11-03 LAB — CBC
HCT: 44.1 % (ref 39.0–52.0)
Hemoglobin: 13.3 g/dL (ref 13.0–17.0)
MCH: 21 pg — ABNORMAL LOW (ref 26.0–34.0)
MCHC: 30.2 g/dL (ref 30.0–36.0)
MCV: 69.6 fL — ABNORMAL LOW (ref 80.0–100.0)
Platelets: 369 10*3/uL (ref 150–400)
RBC: 6.34 MIL/uL — ABNORMAL HIGH (ref 4.22–5.81)
RDW: 19.3 % — ABNORMAL HIGH (ref 11.5–15.5)
WBC: 8.6 10*3/uL (ref 4.0–10.5)
nRBC: 0 % (ref 0.0–0.2)

## 2019-11-03 MED ORDER — AZITHROMYCIN 250 MG PO TABS: ORAL_TABLET | ORAL | 0 refills | Status: DC

## 2019-11-03 NOTE — Telephone Encounter (Signed)
On 11/01/19 started with neck swelling and has progressed with redness,warmth and tender to touch from neck to collarbone area, chin and ears on both sides. No S/T and no difficulty swallowing. Pt has SOB upon exertion which has seemed to worsen the last few days. Pt is always weak and has slight H/A that pt attributes to his neck issues. Concerned of cause; ? Cellulitis. Pt does not have thermometer and does not think has fever.  Pt will go to [facility] ED for eval sometime today;pt will go as soon as possible due to one car in family. UC & ED precautions given and pt voiced understanding. FYI to Dr Diona Browner.

## 2019-11-03 NOTE — ED Provider Notes (Signed)
[facility] Emergency Department Provider Note  ____________________________________________   First MD Initiated Contact with Patient 11/03/19 1244     (approximate)  I have reviewed the triage vital signs and the nursing notes.   HISTORY  Chief Complaint Sore Throat and Oral Swelling   HPI Cody Day is a 64 y.o. male presents to the ED with complaint of lymph node swelling in his neck.  He states that there has been some soreness in his throat but no actual "sore throat".  Patient denies any fever, chills, nausea or vomiting.  He denies any change in taste or smell.  He is unaware of any exposure to Covid.  Patient states that he is a former smoker and stopped in 1997.  Patient does chew tobacco.     Past Medical History:  Diagnosis Date  . Achilles tendon rupture 10/11   and repair  . Adenomatous polyp of colon 04/06/2010   Adenomatous polyps 3 in the descending colon, times one in the ascending colon [facility])  . Asthma   . COPD (chronic obstructive pulmonary disease) ([place])   . Gout   . Hemorrhoids   . Hypercholesteremia   . Hypertension   . Sleep apnea    doesn't wear CPAP  . Syncope 10/11   in settin gof asthma exacerbation w coughing. Ech (10/11): EF > 55%, mild LVH, grade I diastolic dysfunction, nomral RV size and systolic function,. normal valves. Carotid US (10/11): minimal disease    Patient Active Problem List   Diagnosis Date Noted  . Dizziness 10/01/2019  . Polysubstance abuse ([place]) 10/01/2019  . Medication management 09/20/2019  . Nocturnal hypoxia 09/20/2019  . Smokeless tobacco use 09/20/2019  . Physical deconditioning 09/20/2019  . Healthcare maintenance 09/19/2019  . Former smoker 09/19/2019  . Abnormal PSA 06/03/2019  . Hypogonadism in male 06/03/2019  . Varicose veins of leg with pain, bilateral 05/04/2019  . COPD exacerbation ([place]) 11/10/2018  . Alcohol abuse, in remission 08/18/2018  . MDD (major  depressive disorder), single episode, moderate ([place]) 08/18/2018  . Hyponatremia 07/20/2018  . Low testosterone 06/18/2018  . Gynecomastia 04/17/2018  . Enteritis due to Clostridium difficile 07/29/2016  . History of colonic polyps 06/30/2015  . Cardiomyopathy 04/25/2015  . Dyspnea 03/22/2015  . Neuropathy 07/28/2014  . Benign essential tremor 07/28/2014  . History of ventral hernia repair 05/24/2014  . Allergic rhinitis 03/21/2014  . Memory loss 03/26/2013  . Generalized anxiety disorder 12/01/2012  . OSA (obstructive sleep apnea) 12/05/2011  . NECK PAIN, RIGHT 05/04/2008  . Coronary atherosclerosis 12/08/2007  . PROSTATITIS, CHRONIC 08/20/2007  . NEPHROLITHIASIS, HX OF 08/20/2007  . Hyperlipidemia LDL goal <70 06/17/2007  . Prediabetes 06/17/2007  . Gout 06/09/2007  . Body mass index (BMI) of 33.0 to 33.9 in adult 06/09/2007  . Essential hypertension, benign 06/09/2007  . Chronic asthma 06/09/2007  . Exertional chest pain 06/09/2007    Past Surgical History:  Procedure Laterality Date  . ABDOMINAL HERNIA REPAIR  1998,2000  . calcium kidney stones    . [facility], [name], [place]; Dr. [name]. Multiple adenomatous polyps (total 4).   . COLONOSCOPY WITH PROPOFOL N/A 08/30/2015   Procedure: COLONOSCOPY WITH PROPOFOL;  Surgeon: [name], MD;  Location: [facility] ENDOSCOPY;  Service: Endoscopy;  Laterality: N/A;  . FEMORAL HERNIA REPAIR  2007  . HEMORRHOID SURGERY    . HERNIA REPAIR  09/10/2010   Repair of recurrent ventral hernia, resection of previously placed mesh x2, implantation 7.5 x 10 cm Gore-Tex dual mesh in an underlay position, repair of epigastric hernia with a 4.2 cm Proceed ventral patch.  Marland Kitchen HERNIA REPAIR   02//28/2007   Laparoscopic right inguinal hernia repair with Surgipro mesh, Ventrilex patch at umbilicus  . HERNIA REPAIR  11/01/1990  .   Small oval Kugel patch placed in  preperitoneal space, [name], MD  . HERNIA REPAIR  01/17/1997   Recurrent ventral hernia 15 x 19 Gore-Tex dual mesh placed laparoscopically with multiple lefft--sided ports, [name], MD  . HERNIA REPAIR  01/07/1995    Primary repair of ventral hernia. [name], MD  . intestinal blockage     as a child  . KIDNEY STONE extraction     unic acid stones  . left heart cath  2008   minimal luminal irregularities EF 55%  . RIGHT/LEFT HEART CATH AND CORONARY ANGIOGRAPHY N/A 01/13/2017   Procedure: Right/Left Heart Cath and Coronary Angiography;  Surgeon: [name], MD;  Location: [facility];  Service: Cardiovascular;  Laterality: N/A;  . treadmill stress test  2003    Prior to Admission medications   Medication Sig Start Date End Date Taking? Authorizing Provider  albuterol (VENTOLIN HFA) 108 (90 Base) MCG/ACT inhaler Inhale 2 puffs into the lungs every 4 (four) hours as needed for wheezing or shortness of breath. 09/28/18   Flora Lipps, MD  allopurinol (ZYLOPRIM) 300 MG tablet TAKE 1 TABLET BY MOUTH DAILY 07/14/19   Bedsole, Amy E, MD  azithromycin (ZITHROMAX Z-PAK) 250 MG tablet Take 2 tablets (500 mg) on  Day 1,  followed by 1 tablet (250 mg) once daily on Days 2 through 5. 11/03/19   Johnn Hai, PA-C  citalopram (CELEXA) 40 MG tablet Take 1 tablet (40 mg total) by mouth daily. 08/31/19   Bedsole, Amy E, MD  fluticasone (FLOVENT HFA) 220 MCG/ACT inhaler Inhale 2 puffs into the lungs 2 (two) times daily. 11/23/18   Bedsole, Amy E, MD  lisinopril (ZESTRIL) 20 MG tablet TAKE 1 TABLET BY MOUTH DAILY 07/14/19   Bedsole, Amy E, MD  montelukast (SINGULAIR) 10 MG tablet TAKE 1 TABLET BY MOUTH DAILY 07/14/19   Bedsole, Amy E, MD  Multiple Vitamin (MULTIVITAMIN WITH MINERALS) TABS tablet Take 1 tablet by mouth daily. 07/22/18   Gladstone Lighter, MD  simvastatin (ZOCOR) 20 MG tablet TAKE 1 TABLET BY MOUTH DAILY 03/22/19   Bedsole, Amy E, MD  STIOLTO RESPIMAT 2.5-2.5 MCG/ACT AERS USE 2  INHALATIONS EVERY DAY 10/08/19   Bedsole, Amy E, MD  testosterone cypionate (DEPOTESTOSTERONE CYPIONATE) 200 MG/ML injection Inject 0.3 mLs (60 mg total) into the muscle once a week. 07/18/19   Stoioff, Ronda Fairly, MD    Allergies Levaquin [levofloxacin in d5w] and Penicillins  Family History  Problem Relation Age of Onset  . Hypertension Father   . Cataracts Father   . Diabetes Father   . Heart attack Mother 74  . Coronary artery disease Mother   . Dementia Mother   . Hypertension Sister   . Depression Sister   . Hypertension Brother   . Colon cancer Brother   . Diabetes Other        1st degree relative  . Colon cancer Maternal Aunt     Social History Social History   Tobacco Use  . Smoking status: Former Smoker    Packs/day: 0.25    Years:  35.00    Pack years: 8.75    Types: Cigarettes    Quit date: 10/22/1995    Years since quitting: 24.0  . Smokeless tobacco: Current User    Types: Chew  . Tobacco comment: rarely smoked cigarettes and cigars.  states he smoked drugs mostly  Substance Use Topics  . Alcohol use: Yes    Alcohol/week: 0.0 standard drinks    Comment: 6 beers daily; used to use cocaine and methamphetamine  . Drug use: No    Types: Marijuana, Methamphetamines    Comment: former smokerx 27 years    Review of Systems Constitutional: No fever/chills Eyes: No visual changes. ENT: Positive for throat discomfort and lymph node enlargement. Cardiovascular: Denies chest pain. Respiratory: Denies shortness of breath.  Negative for cough. Gastrointestinal: No abdominal pain.  No nausea, no vomiting.  No diarrhea.   Musculoskeletal: Negative for muscle aches. Skin: Negative for rash. Neurological: Negative for headaches  ____________________________________________   PHYSICAL EXAM:  VITAL SIGNS: ED Triage Vitals  Enc Vitals Group     BP 11/03/19 1159 (!) 141/88     Pulse Rate 11/03/19 1159 98     Resp 11/03/19 1159 20     Temp 11/03/19 1159 98.5 F  (36.9 C)     Temp Source 11/03/19 1159 Oral     SpO2 11/03/19 1159 97 %     Weight 11/03/19 1138 247 lb (112 kg)     Height 11/03/19 1138 5\' 11"  (1.803 m)     Head Circumference --      Peak Flow --      Pain Score 11/03/19 1138 8     Pain Loc --      Pain Edu? --      Excl. in [place]? --     Constitutional: Alert and oriented. Well appearing and in no acute distress. Eyes: Conjunctivae are normal.  Head: Atraumatic. Nose: No congestion/rhinnorhea. Mouth/Throat: Mucous membranes are moist.  Oropharynx non-erythematous.  No masses or edema noted to the gums.  Mild posterior drainage is noted. Neck: No stridor.   Hematological/Lymphatic/Immunilogical: Bilateral tender cervical lymphadenopathy. Cardiovascular: Normal rate, regular rhythm. Grossly normal heart sounds.  Good peripheral circulation. Respiratory: Normal respiratory effort.  No retractions. Lungs CTAB. Neurologic:  Normal speech and language. No gross focal neurologic deficits are appreciated. No gait instability. Skin:  Skin is warm, dry and intact. No rash noted. Psychiatric: Mood and affect are normal. Speech and behavior are normal.  ____________________________________________   LABS (all labs ordered are listed, but only abnormal results are displayed)  Labs Reviewed  CBC - Abnormal; Notable for the following components:      Result Value   RBC 6.34 (*)    MCV 69.6 (*)    MCH 21.0 (*)    RDW 19.3 (*)    All other components within normal limits  COMPREHENSIVE METABOLIC PANEL - Abnormal; Notable for the following components:   Sodium 133 (*)    Chloride 97 (*)    Glucose, Bld 145 (*)    Total Bilirubin 1.8 (*)    All other components within normal limits     PROCEDURES  Procedure(s) performed (including Critical Care):  Procedures ____________________________________________   INITIAL IMPRESSION / ASSESSMENT AND PLAN / ED COURSE  As part of my medical decision making, I reviewed the following data  within the electronic MEDICAL RECORD NUMBER Notes from prior ED visits and [place] Controlled Substance Database  64 year old male presents to the ED with complaint of swollen  lymph glands for a couple of days.  Patient denies any fever, chills, nausea or vomiting.  He denies any Covid exposure.  He continues to drink fluids without any difficulty and maintains secretions without any difficulty.  Patient is able to talk in complete sentences without any difficulty.  On exam there is some tenderness bilateral lymphadenopathy in cervical region.  No evidence of obvious oral infection.  Lab work was reassuring and patient was made aware.  At this time we will try Zithromax to see if this reduces his lymphadenopathy.  He is encouraged to follow-up with his PCP if any continued problems.  We also discussed possibility of following up with ENT for further evaluation and possible imaging.  He is also aware that occasionally that the lymph node needs to be biopsied.  Patient agrees with the plan and his medication was sent to his pharmacy.  He has return to the emergency department if any severe worsening of his symptoms.  ____________________________________________   FINAL CLINICAL IMPRESSION(S) / ED DIAGNOSES  Final diagnoses:  Lymphadenopathy     ED Discharge Orders         Ordered    azithromycin (ZITHROMAX Z-PAK) 250 MG tablet     11/03/19 1306           Note:  This document was prepared using Dragon voice recognition software and may include unintentional dictation errors.    Johnn Hai, PA-C 11/03/19 1444    Lilia Pro., MD 11/03/19 1535

## 2019-11-03 NOTE — ED Triage Notes (Signed)
Pt reports for the last couple of days he had some swelling in his throat and soreness and feels like his glands are swollen. Denies fevers.

## 2019-11-03 NOTE — Discharge Instructions (Signed)
Follow-up with your primary care provider after finishing the antibiotic.  Be aware that the antibiotic is to be taken for 5 days but will stay in your body for 10.  If any severe worsening of your symptoms such as high fever, chills, difficulty swallowing return to the emergency department.  Continue to drink fluids.

## 2019-11-03 NOTE — Telephone Encounter (Signed)
Agree with ER disposition.

## 2019-11-04 ENCOUNTER — Telehealth: Payer: Self-pay | Admitting: Family Medicine

## 2019-11-04 ENCOUNTER — Encounter: Payer: Medicare Other | Admitting: *Deleted

## 2019-11-04 DIAGNOSIS — I420 Dilated cardiomyopathy: Secondary | ICD-10-CM

## 2019-11-04 DIAGNOSIS — I2510 Atherosclerotic heart disease of native coronary artery without angina pectoris: Secondary | ICD-10-CM

## 2019-11-04 DIAGNOSIS — J4550 Severe persistent asthma, uncomplicated: Secondary | ICD-10-CM

## 2019-11-04 NOTE — Progress Notes (Signed)
.   Virtual call completed today. Calls are done every week that patient is not attending an onsite exercise session during the COVID 19 PHE Crisis.   Today's call included review of :their home exercise  Art had ED visit yesterday for raw,swollen throat. Has been prescribed a Z Pack. He is aware to finish med and be symptom free before returning to program. He is taking it easy this week as he heals.

## 2019-11-04 NOTE — Telephone Encounter (Signed)
Pt left a voicemail stating that he was seen in the ED on 11/02/2018 or swollen glands and was given a Zpak x 2 days.  Pt advised to contact his PCP following d/c home, pt advised to get further instruction from PCP or be seen x 10 days after D/C.  Will send to PCP as Cody Day

## 2019-11-05 NOTE — Telephone Encounter (Signed)
Left message asking pt to call office  °

## 2019-11-05 NOTE — Telephone Encounter (Signed)
Robin,   Please schedule ER follow up as instructed by Dr. Diona Browner.

## 2019-11-05 NOTE — Telephone Encounter (Signed)
Spoke to  pt he just started antibiotic yesterday  Schedule appointment 1/26 if not feeling better he will call to schedule sooner.  He also mention that [facility] wanted him to see cardiology and wanted dr Diona Browner to refer and he wants  [facility] in [place]

## 2019-11-05 NOTE — Addendum Note (Signed)
Addended byEliezer Lofts E on: 11/05/2019 02:07 PM   Modules accepted: Orders

## 2019-11-05 NOTE — Telephone Encounter (Signed)
If improving.. make follow up 10 days after discharge.. if worsening make appt  sooner.. today or Tuesday.

## 2019-11-05 NOTE — Telephone Encounter (Signed)
Agree. Referral to cardiology made.

## 2019-11-11 ENCOUNTER — Other Ambulatory Visit: Payer: Self-pay

## 2019-11-11 ENCOUNTER — Encounter: Payer: Medicare Other | Admitting: *Deleted

## 2019-11-11 DIAGNOSIS — J449 Chronic obstructive pulmonary disease, unspecified: Secondary | ICD-10-CM | POA: Diagnosis not present

## 2019-11-11 DIAGNOSIS — G4730 Sleep apnea, unspecified: Secondary | ICD-10-CM | POA: Diagnosis not present

## 2019-11-11 DIAGNOSIS — J4550 Severe persistent asthma, uncomplicated: Secondary | ICD-10-CM | POA: Diagnosis not present

## 2019-11-11 DIAGNOSIS — Z79899 Other long term (current) drug therapy: Secondary | ICD-10-CM | POA: Diagnosis not present

## 2019-11-11 DIAGNOSIS — E7800 Pure hypercholesterolemia, unspecified: Secondary | ICD-10-CM | POA: Diagnosis not present

## 2019-11-11 DIAGNOSIS — I10 Essential (primary) hypertension: Secondary | ICD-10-CM | POA: Diagnosis not present

## 2019-11-11 NOTE — Progress Notes (Signed)
Daily Session Note  Patient Details  Name: Cody Day MRN: [id number] Date of Birth: 1956/02/05 Referring Provider:     Pulmonary Rehab from 10/05/2019 in [facility] Cardiac and Pulmonary Rehab  Referring Provider  [name] MD      Encounter Date: 11/11/2019  Check In: Session Check In - 11/11/19 0901      Check-In   Supervising physician immediately available to respond to emergencies  See telemetry face sheet for immediately available ER MD    Location  [facility]-Cardiac & Pulmonary Rehab    Staff Present  Heath Lark, RN, BSN, CCRP;Joseph Hood RCP,RRT,BSRT;Jessica Kamaili, [place], [place], [place], CCET    Virtual Visit  No    Medication changes reported      No    Fall or balance concerns reported     No    Warm-up and Cool-down  Performed on first and last piece of equipment    Resistance Training Performed  Yes    VAD Patient?  No    PAD/SET Patient?  No      Pain Assessment   Currently in Pain?  No/denies          Social History   Tobacco Use  Smoking Status Former Smoker  . Packs/day: 0.25  . Years: 35.00  . Pack years: 8.75  . Types: Cigarettes  . Quit date: 10/22/1995  . Years since quitting: 24.0  Smokeless Tobacco Current User  . Types: Chew  Tobacco Comment   rarely smoked cigarettes and cigars.  states he smoked drugs mostly    Goals Met:  Independence with exercise equipment Exercise tolerated well No report of cardiac concerns or symptoms  Goals Unmet:  Not Applicable  Comments: Pt able to follow exercise prescription today without complaint.  Will continue to monitor for progression.    Dr. Emily Filbert is Medical Director for [facility].

## 2019-11-15 ENCOUNTER — Other Ambulatory Visit: Payer: Self-pay

## 2019-11-15 ENCOUNTER — Encounter: Payer: Medicare Other | Admitting: *Deleted

## 2019-11-15 DIAGNOSIS — Z79899 Other long term (current) drug therapy: Secondary | ICD-10-CM | POA: Diagnosis not present

## 2019-11-15 DIAGNOSIS — G4730 Sleep apnea, unspecified: Secondary | ICD-10-CM | POA: Diagnosis not present

## 2019-11-15 DIAGNOSIS — J4550 Severe persistent asthma, uncomplicated: Secondary | ICD-10-CM | POA: Diagnosis not present

## 2019-11-15 DIAGNOSIS — E7800 Pure hypercholesterolemia, unspecified: Secondary | ICD-10-CM | POA: Diagnosis not present

## 2019-11-15 DIAGNOSIS — J449 Chronic obstructive pulmonary disease, unspecified: Secondary | ICD-10-CM | POA: Diagnosis not present

## 2019-11-15 DIAGNOSIS — I10 Essential (primary) hypertension: Secondary | ICD-10-CM | POA: Diagnosis not present

## 2019-11-15 NOTE — Progress Notes (Signed)
Daily Session Note  Patient Details  Name: Cody Day MRN: [id number] Date of Birth: [birth year] Referring Provider:     Pulmonary Rehab from 10/05/2019 in [facility] Cardiac and Pulmonary Rehab  Referring Provider  [name] MD      Encounter Date: 11/15/2019  Check In: Session Check In - 11/15/19 1006      Check-In   Supervising physician immediately available to respond to emergencies  See telemetry face sheet for immediately available ER MD    Staff Present  Heath Lark, RN, BSN, Laveda Norman, BS, ACSM CEP, Exercise Physiologist;Joseph Tessie Fass RCP,RRT,BSRT    Virtual Visit  No    Medication changes reported      No    Fall or balance concerns reported     No    Warm-up and Cool-down  Performed on first and last piece of equipment    Resistance Training Performed  Yes    VAD Patient?  No    PAD/SET Patient?  No      Pain Assessment   Currently in Pain?  No/denies          Social History   Tobacco Use  Smoking Status Former Smoker  . Packs/day: 0.25  . Years: 35.00  . Pack years: 8.75  . Types: Cigarettes  . Quit date: 10/22/1995  . Years since quitting: 24.0  Smokeless Tobacco Current User  . Types: Chew  Tobacco Comment   rarely smoked cigarettes and cigars.  states he smoked drugs mostly    Goals Met:  Proper associated with RPD/PD & O2 Sat Independence with exercise equipment Exercise tolerated well No report of cardiac concerns or symptoms  Goals Unmet:  Not Applicable  Comments: Pt able to follow exercise prescription today without complaint.  Will continue to monitor for progression.    Dr. Emily Filbert is Medical Director for [facility].

## 2019-11-16 ENCOUNTER — Other Ambulatory Visit: Payer: Self-pay

## 2019-11-16 ENCOUNTER — Encounter: Payer: Self-pay | Admitting: Family Medicine

## 2019-11-16 ENCOUNTER — Ambulatory Visit (INDEPENDENT_AMBULATORY_CARE_PROVIDER_SITE_OTHER): Payer: Medicare Other | Admitting: Family Medicine

## 2019-11-16 VITALS — BP 130/80 | HR 95 | Temp 97.6°F | Ht 71.0 in | Wt 250.0 lb

## 2019-11-16 DIAGNOSIS — I420 Dilated cardiomyopathy: Secondary | ICD-10-CM | POA: Diagnosis not present

## 2019-11-16 DIAGNOSIS — I2510 Atherosclerotic heart disease of native coronary artery without angina pectoris: Secondary | ICD-10-CM

## 2019-11-16 DIAGNOSIS — R591 Generalized enlarged lymph nodes: Secondary | ICD-10-CM

## 2019-11-16 DIAGNOSIS — F321 Major depressive disorder, single episode, moderate: Secondary | ICD-10-CM

## 2019-11-16 MED ORDER — CITALOPRAM HYDROBROMIDE 40 MG PO TABS: 40.0000 mg | ORAL_TABLET | Freq: Every day | ORAL | 1 refills | Status: DC

## 2019-11-16 NOTE — Assessment & Plan Note (Signed)
LVH on recent ECHO, nml EF.HAS upcoming appt with cardiology.

## 2019-11-16 NOTE — Progress Notes (Signed)
Chief Complaint  Patient presents with  . Follow-up    [facility] ER-Lymphadenopathy    History of Present Illness: HPI   64 year old male with severe COPD/asthma presents for follow up ER visit on 11/03/2019.  ER note reviewed in detail and pertinent info  below: Seen for Bilateral tender cervical  lymphadenopathy CMET,CBC unremarkable Given azithromycin for possible bacterial source of lymphadenitis.   Today he reports after  3 day of antibiotics.Marland Kitchen swelling in neck resolved.  No change in breathing, no ST, fever.   Cardiopulmonary rehab for his COPD asthma.  Doing stretching and strengthening at home. Goes up and down steps and to mailbox.    MDD well controlled on celexa  Recent ECHo showing LVH.. has appt with Dr. Fletcher Anon on feb18  This visit occurred during the SARS-CoV-2 public health emergency.  Safety protocols were in place, including screening questions prior to the visit, additional usage of staff PPE, and extensive cleaning of exam room while observing appropriate contact time as indicated for disinfecting solutions.   COVID 19 screen:  No recent travel or known exposure to COVID19 The patient denies respiratory symptoms of COVID 19 at this time. The importance of social distancing was discussed today.     Review of Systems  Constitutional: Negative for chills and fever.  HENT: Negative for congestion and ear pain.   Eyes: Negative for pain and redness.  Respiratory: Negative for cough and shortness of breath.   Cardiovascular: Negative for chest pain, palpitations and leg swelling.  Gastrointestinal: Negative for abdominal pain, blood in stool, constipation, diarrhea, nausea and vomiting.  Genitourinary: Negative for dysuria.  Musculoskeletal: Negative for falls and myalgias.  Skin: Negative for rash.  Neurological: Negative for dizziness.  Psychiatric/Behavioral: Negative for depression. The patient is not nervous/anxious.       Past Medical History:   Diagnosis Date  . Achilles tendon rupture 10/11   and repair  . Adenomatous polyp of colon 04/06/2010   Adenomatous polyps 3 in the descending colon, times one in the ascending colon [facility])  . Asthma   . COPD (chronic obstructive pulmonary disease) ([place])   . Gout   . Hemorrhoids   . Hypercholesteremia   . Hypertension   . Sleep apnea    doesn't wear CPAP  . Syncope 10/11   in settin gof asthma exacerbation w coughing. Ech (10/11): EF > 55%, mild LVH, grade I diastolic dysfunction, nomral RV size and systolic function,. normal valves. Carotid US (10/11): minimal disease    reports that he quit smoking about 24 years ago. His smoking use included cigarettes. He has a 8.75 pack-year smoking history. His smokeless tobacco use includes chew. He reports current alcohol use. He reports that he does not use drugs.   Current Outpatient Medications:  .  albuterol (VENTOLIN HFA) 108 (90 Base) MCG/ACT inhaler, Inhale 2 puffs into the lungs every 4 (four) hours as needed for wheezing or shortness of breath., Disp: 1 Inhaler, Rfl: 10 .  allopurinol (ZYLOPRIM) 300 MG tablet, TAKE 1 TABLET BY MOUTH DAILY, Disp: 90 tablet, Rfl: 1 .  citalopram (CELEXA) 40 MG tablet, Take 1 tablet (40 mg total) by mouth daily., Disp: 30 tablet, Rfl: 5 .  fluticasone (FLOVENT HFA) 220 MCG/ACT inhaler, Inhale 2 puffs into the lungs 2 (two) times daily., Disp: 1 Inhaler, Rfl: 5 .  lisinopril (ZESTRIL) 20 MG tablet, TAKE 1 TABLET BY MOUTH DAILY, Disp: 90 tablet, Rfl: 1 .  montelukast (SINGULAIR) 10 MG tablet, TAKE 1 TABLET  BY MOUTH DAILY, Disp: 90 tablet, Rfl: 1 .  Multiple Vitamin (MULTIVITAMIN WITH MINERALS) TABS tablet, Take 1 tablet by mouth daily., Disp: 30 tablet, Rfl: 2 .  simvastatin (ZOCOR) 20 MG tablet, TAKE 1 TABLET BY MOUTH DAILY, Disp: 90 tablet, Rfl: 3 .  STIOLTO RESPIMAT 2.5-2.5 MCG/ACT AERS, USE 2 INHALATIONS EVERY DAY, Disp: 4 g, Rfl: 5 .  testosterone cypionate (DEPOTESTOSTERONE CYPIONATE) 200  MG/ML injection, Inject 0.3 mLs (60 mg total) into the muscle once a week., Disp: 6 mL, Rfl: 0   Observations/Objective: Blood pressure 130/80, pulse 95, temperature 97.6 F (36.4 C), temperature source Temporal, height 5\' 11"  (1.803 m), weight 250 lb (113.4 kg), SpO2 98 %.  Physical Exam Constitutional:      Appearance: He is well-developed.  HENT:     Head: Normocephalic.     Right Ear: Hearing normal.     Left Ear: Hearing normal.     Nose: Nose normal.  Neck:     Thyroid: No thyroid mass or thyromegaly.     Vascular: No carotid bruit.     Trachea: Trachea normal.  Cardiovascular:     Rate and Rhythm: Normal rate and regular rhythm.     Pulses: Normal pulses.     Heart sounds: Heart sounds not distant. No murmur. No friction rub. No gallop.      Comments: No peripheral edema Pulmonary:     Effort: Respiratory distress present.     Breath sounds: Normal breath sounds.     Comments: Baseline increase in effort but lungs clearer to exam than usual Skin:    General: Skin is warm and dry.     Findings: No rash.  Psychiatric:        Speech: Speech normal.        Behavior: Behavior normal.        Thought Content: Thought content normal.      Assessment and Plan   Cardiomyopathy  LVH on recent ECHO, nml EF.HAS upcoming appt with cardiology.  Lymphadenopathy Likely bacterial.. now resolved after antbiotics and time.  MDD (major depressive disorder), single episode, moderate (HCC) Good control.. refilled celexa.     Eliezer Lofts, MD

## 2019-11-16 NOTE — Assessment & Plan Note (Signed)
Likely bacterial.. now resolved after antbiotics and time.

## 2019-11-16 NOTE — Assessment & Plan Note (Signed)
Good control.. refilled celexa.

## 2019-11-17 ENCOUNTER — Encounter: Payer: Self-pay | Admitting: *Deleted

## 2019-11-17 DIAGNOSIS — J4550 Severe persistent asthma, uncomplicated: Secondary | ICD-10-CM

## 2019-11-17 NOTE — Progress Notes (Signed)
Pulmonary Individual Treatment Plan  Patient Details  Name: LAWYER WASHABAUGH MRN: [id number] Date of Birth: 04/03/1956 Referring Provider:     Pulmonary Rehab from 10/05/2019 in [facility] Cardiac and Pulmonary Rehab  Referring Provider  [name] MD      Initial Encounter Date:    Pulmonary Rehab from 10/05/2019 in [facility] Cardiac and Pulmonary Rehab  Date  10/05/19      Visit Diagnosis: Severe persistent chronic asthma without complication  Patient's Home Medications on Admission:  Current Outpatient Medications:  .  albuterol (VENTOLIN HFA) 108 (90 Base) MCG/ACT inhaler, Inhale 2 puffs into the lungs every 4 (four) hours as needed for wheezing or shortness of breath., Disp: 1 Inhaler, Rfl: 10 .  allopurinol (ZYLOPRIM) 300 MG tablet, TAKE 1 TABLET BY MOUTH DAILY, Disp: 90 tablet, Rfl: 1 .  citalopram (CELEXA) 40 MG tablet, Take 1 tablet (40 mg total) by mouth daily., Disp: 90 tablet, Rfl: 1 .  fluticasone (FLOVENT HFA) 220 MCG/ACT inhaler, Inhale 2 puffs into the lungs 2 (two) times daily., Disp: 1 Inhaler, Rfl: 5 .  lisinopril (ZESTRIL) 20 MG tablet, TAKE 1 TABLET BY MOUTH DAILY, Disp: 90 tablet, Rfl: 1 .  montelukast (SINGULAIR) 10 MG tablet, TAKE 1 TABLET BY MOUTH DAILY, Disp: 90 tablet, Rfl: 1 .  Multiple Vitamin (MULTIVITAMIN WITH MINERALS) TABS tablet, Take 1 tablet by mouth daily., Disp: 30 tablet, Rfl: 2 .  simvastatin (ZOCOR) 20 MG tablet, TAKE 1 TABLET BY MOUTH DAILY, Disp: 90 tablet, Rfl: 3 .  STIOLTO RESPIMAT 2.5-2.5 MCG/ACT AERS, USE 2 INHALATIONS EVERY DAY, Disp: 4 g, Rfl: 5 .  testosterone cypionate (DEPOTESTOSTERONE CYPIONATE) 200 MG/ML injection, Inject 0.3 mLs (60 mg total) into the muscle once a week., Disp: 6 mL, Rfl: 0  Past Medical History: Past Medical History:  Diagnosis Date  . Achilles tendon rupture 10/11   and repair  . Adenomatous polyp of colon 04/06/2010   Adenomatous polyps 3 in the descending colon, times one in the ascending colon [facility])  .  Asthma   . COPD (chronic obstructive pulmonary disease) ([place])   . Gout   . Hemorrhoids   . Hypercholesteremia   . Hypertension   . Sleep apnea    doesn't wear CPAP  . Syncope 10/11   in settin gof asthma exacerbation w coughing. Ech (10/11): EF > 55%, mild LVH, grade I diastolic dysfunction, nomral RV size and systolic function,. normal valves. Carotid US (10/11): minimal disease    Tobacco Use: Social History   Tobacco Use  Smoking Status Former Smoker  . Packs/day: 0.25  . Years: 35.00  . Pack years: 8.75  . Types: Cigarettes  . Quit date: 10/22/1995  . Years since quitting: 24.0  Smokeless Tobacco Current User  . Types: Chew  Tobacco Comment   rarely smoked cigarettes and cigars.  states he smoked drugs mostly    Labs: Recent Review Flowsheet Data    Labs for ITP Cardiac and Pulmonary Rehab Latest Ref Rng & Units 08/23/2016 04/01/2018 07/20/2018 02/15/2019 08/30/2019   Cholestrol 0 - 200 mg/dL 174 168 - 136 123   LDLCALC 0 - 99 mg/dL - 75 - 71 57   LDLDIRECT mg/dL 53.0 - - - -   HDL >39.00 mg/dL 97.10 81.60 - 44.50 51.00   Trlycerides 0.0 - 149.0 mg/dL 257.0(H) 58.0 - 103.0 76.0   Hemoglobin A1c 4.6 - 6.5 % 6.2 6.1 5.7(H) 6.8(H) 6.3   TCO2 0 - 100 mmol/L - - - - -  Pulmonary Assessment Scores: Pulmonary Assessment Scores    Row Name 10/05/19 1501         ADL UCSD   ADL Phase  Entry     SOB Score total  79     Rest  1     Walk  3     Stairs  4     Bath  5     Dress  4     Shop  4       CAT Score   CAT Score  25       mMRC Score   mMRC Score  4        UCSD: Self-administered rating of dyspnea associated with activities of daily living (ADLs) 6-point scale (0 = "not at all" to 5 = "maximal or unable to do because of breathlessness")  Scoring Scores range from 0 to 120.  Minimally important difference is 5 units  CAT: CAT can identify the health impairment of COPD patients and is better correlated with disease progression.  CAT has a scoring range  of zero to 40. The CAT score is classified into four groups of low (less than 10), medium (10 - 20), high (21-30) and very high (31-40) based on the impact level of disease on health status. A CAT score over 10 suggests significant symptoms.  A worsening CAT score could be explained by an exacerbation, poor medication adherence, poor inhaler technique, or progression of COPD or comorbid conditions.  CAT MCID is 2 points  mMRC: mMRC (Modified Medical Research Council) Dyspnea Scale is used to assess the degree of baseline functional disability in patients of respiratory disease due to dyspnea. No minimal important difference is established. A decrease in score of 1 point or greater is considered a positive change.   Pulmonary Function Assessment:   Exercise Target Goals: Exercise Program Goal: Individual exercise prescription set using results from initial 6 min walk test and THRR while considering  patient's activity barriers and safety.   Exercise Prescription Goal: Initial exercise prescription builds to 30-45 minutes a day of aerobic activity, 2-3 days per week.  Home exercise guidelines will be given to patient during program as part of exercise prescription that the participant will acknowledge.  Activity Barriers & Risk Stratification: Activity Barriers & Cardiac Risk Stratification - 10/05/19 1451      Activity Barriers & Cardiac Risk Stratification   Activity Barriers  Deconditioning;Muscular Weakness;Other (comment);Shortness of Breath;Balance Concerns    Comments  shuffles feet, tremors       6 Minute Walk: 6 Minute Walk    Row Name 10/05/19 1441         6 Minute Walk   Phase  Initial     Distance  994 feet     Walk Time  6 minutes     # of Rest Breaks  0     MPH  1.88     METS  2.82     RPE  15     Perceived Dyspnea   3     VO2 Peak  9.88     Symptoms  Yes (comment)     Comments  hip pain 3/10, SOB, knee pain 3/10, SOB     Resting HR  90 bpm     Resting BP   112/64     Resting Oxygen Saturation   97 %     Exercise Oxygen Saturation  during 6 min walk  95 %     Max  Ex. HR  125 bpm     Max Ex. BP  146/64     2 Minute Post BP  134/70       Interval HR   1 Minute HR  99     2 Minute HR  119     3 Minute HR  114     4 Minute HR  102     5 Minute HR  125     6 Minute HR  98     2 Minute Post HR  92     Interval Heart Rate?  Yes       Interval Oxygen   Interval Oxygen?  Yes     Baseline Oxygen Saturation %  97 %     1 Minute Oxygen Saturation %  96 %     1 Minute Liters of Oxygen  0 L Room Air     2 Minute Oxygen Saturation %  95 %     2 Minute Liters of Oxygen  0 L     3 Minute Oxygen Saturation %  96 %     3 Minute Liters of Oxygen  0 L     4 Minute Oxygen Saturation %  95 %     4 Minute Liters of Oxygen  0 L     5 Minute Oxygen Saturation %  95 %     5 Minute Liters of Oxygen  0 L     6 Minute Oxygen Saturation %  96 %     6 Minute Liters of Oxygen  0 L     2 Minute Post Oxygen Saturation %  97 %     2 Minute Post Liters of Oxygen  0 L       Oxygen Initial Assessment: Oxygen Initial Assessment - 10/04/19 1036      Home Oxygen   Home Oxygen Device  Home Concentrator    Sleep Oxygen Prescription  Continuous    Liters per minute  2    Home Exercise Oxygen Prescription  None    Home at Rest Exercise Oxygen Prescription  None    Compliance with Home Oxygen Use  Yes      Initial 6 min Walk   Oxygen Used  None      Program Oxygen Prescription   Program Oxygen Prescription  None      Intervention   Short Term Goals  To learn and exhibit compliance with exercise, home and travel O2 prescription;To learn and understand importance of maintaining oxygen saturations>88%;To learn and demonstrate proper use of respiratory medications;To learn and demonstrate proper pursed lip breathing techniques or other breathing techniques.;To learn and understand importance of monitoring SPO2 with pulse oximeter and demonstrate accurate use of the  pulse oximeter.    Long  Term Goals  Verbalizes importance of monitoring SPO2 with pulse oximeter and return demonstration;Exhibits proper breathing techniques, such as pursed lip breathing or other method taught during program session;Demonstrates proper use of MDI's;Exhibits compliance with exercise, home and travel O2 prescription;Maintenance of O2 saturations>88%;Compliance with respiratory medication       Oxygen Re-Evaluation: Oxygen Re-Evaluation    Row Name 10/06/19 0858 11/11/19 0844           Program Oxygen Prescription   Program Oxygen Prescription  None  None        Home Oxygen   Home Oxygen Device  Home Concentrator  Home Concentrator      Sleep Oxygen Prescription  Continuous  Continuous      Liters per minute  2  2      Home Exercise Oxygen Prescription  None  None      Home at Rest Exercise Oxygen Prescription  None  None      Compliance with Home Oxygen Use  Yes  Yes        Goals/Expected Outcomes   Short Term Goals  To learn and understand importance of monitoring SPO2 with pulse oximeter and demonstrate accurate use of the pulse oximeter.;To learn and understand importance of maintaining oxygen saturations>88%;To learn and demonstrate proper pursed lip breathing techniques or other breathing techniques.  To learn and exhibit compliance with exercise, home and travel O2 prescription;To learn and understand importance of monitoring SPO2 with pulse oximeter and demonstrate accurate use of the pulse oximeter.;To learn and understand importance of maintaining oxygen saturations>88%;To learn and demonstrate proper pursed lip breathing techniques or other breathing techniques.;To learn and demonstrate proper use of respiratory medications      Long  Term Goals  Verbalizes importance of monitoring SPO2 with pulse oximeter and return demonstration;Maintenance of O2 saturations>88%;Exhibits proper breathing techniques, such as pursed lip breathing or other method taught during  program session  Exhibits compliance with exercise, home and travel O2 prescription;Verbalizes importance of monitoring SPO2 with pulse oximeter and return demonstration;Maintenance of O2 saturations>88%;Exhibits proper breathing techniques, such as pursed lip breathing or other method taught during program session;Compliance with respiratory medication;Demonstrates proper use of MDI's      Comments  Reviewed PLB technique with pt.  Talked about how it works and it's importance in maintaining their exercise saturations.  Reviewed PLB - pt is using inhalers as directed.  Pateient doesnt currently have oximeter at home.  He will order one and check O2 at home      Goals/Expected Outcomes  Short: Become more profiecient at using PLB.   Long: Become independent at using PLB.  Short :  continue to practice PLB Long ; become independent using PLB         Oxygen Discharge (Final Oxygen Re-Evaluation): Oxygen Re-Evaluation - 11/11/19 0844      Program Oxygen Prescription   Program Oxygen Prescription  None      Home Oxygen   Home Oxygen Device  Home Concentrator    Sleep Oxygen Prescription  Continuous    Liters per minute  2    Home Exercise Oxygen Prescription  None    Home at Rest Exercise Oxygen Prescription  None    Compliance with Home Oxygen Use  Yes      Goals/Expected Outcomes   Short Term Goals  To learn and exhibit compliance with exercise, home and travel O2 prescription;To learn and understand importance of monitoring SPO2 with pulse oximeter and demonstrate accurate use of the pulse oximeter.;To learn and understand importance of maintaining oxygen saturations>88%;To learn and demonstrate proper pursed lip breathing techniques or other breathing techniques.;To learn and demonstrate proper use of respiratory medications    Long  Term Goals  Exhibits compliance with exercise, home and travel O2 prescription;Verbalizes importance of monitoring SPO2 with pulse oximeter and return  demonstration;Maintenance of O2 saturations>88%;Exhibits proper breathing techniques, such as pursed lip breathing or other method taught during program session;Compliance with respiratory medication;Demonstrates proper use of MDI's    Comments  Reviewed PLB - pt is using inhalers as directed.  Pateient doesnt currently have oximeter at home.  He will order one and check O2 at home    Goals/Expected Outcomes  Short :  continue to practice PLB Long ; become independent using PLB       Initial Exercise Prescription: Initial Exercise Prescription - 10/05/19 1400      Date of Initial Exercise RX and Referring Provider   Date  10/05/19    Referring Provider  [name] MD      Treadmill   MPH  1.6    Grade  0.5    Minutes  15    METs  2.34      NuStep   Level  2    SPM  80    Minutes  15    METs  2      REL-XR   Level  1    Speed  50    Minutes  15    METs  2      T5 Nustep   Level  2    SPM  80    Minutes  15    METs  2      Prescription Details   Frequency (times per week)  3    Duration  Progress to 30 minutes of continuous aerobic without signs/symptoms of physical distress      Intensity   THRR 40-80% of Max Heartrate  117-144    Ratings of Perceived Exertion  11-13    Perceived Dyspnea  0-4      Progression   Progression  Continue to progress workloads to maintain intensity without signs/symptoms of physical distress.      Resistance Training   Training Prescription  Yes    Weight  3 lb    Reps  10-15       Perform Capillary Blood Glucose checks as needed.  Exercise Prescription Changes: Exercise Prescription Changes    Row Name 10/05/19 1400 10/21/19 1300 11/05/19 1000 11/15/19 1700       Response to Exercise   Blood Pressure (Admit)  112/64  136/70  142/80  138/78    Blood Pressure (Exercise)  146/64  164/84  140/66  148/70    Blood Pressure (Exit)  142/70  --  128/80  130/80    Heart Rate (Admit)  90 bpm  97 bpm  98 bpm  95 bpm    Heart Rate  (Exercise)  125 bpm  106 bpm  103 bpm  113 bpm    Heart Rate (Exit)  95 bpm  101 bpm  98 bpm  90 bpm    Oxygen Saturation (Admit)  97 %  97 %  96 %  95 %    Oxygen Saturation (Exercise)  95 %  96 %  96 %  94 %    Oxygen Saturation (Exit)  98 %  96 %  96 %  96 %    Rating of Perceived Exertion (Exercise)  15  14  15  13     Perceived Dyspnea (Exercise)  3  3  3   --    Symptoms  hip pain 3/10, knee pain 3/10, SOB  --  SOB  --    Comments  walk test results  --  --  --    Duration  --  Continue with 30 min of aerobic exercise without signs/symptoms of physical distress.  Continue with 30 min of aerobic exercise without signs/symptoms of physical distress.  Continue with 30 min of aerobic exercise without signs/symptoms of physical distress.    Intensity  --  THRR unchanged  THRR unchanged  THRR unchanged  Progression   Progression  --  Continue to progress workloads to maintain intensity without signs/symptoms of physical distress.  Continue to progress workloads to maintain intensity without signs/symptoms of physical distress.  Continue to progress workloads to maintain intensity without signs/symptoms of physical distress.    Average METs  --  2.2  2.46  2.2      Resistance Training   Training Prescription  --  Yes  Yes  Yes    Weight  --  3 lb  6 lb  6 lb    Reps  --  10-15  10-15  10-15      Interval Training   Interval Training  --  --  No  No      Treadmill   MPH  --  1.6  1.8  --    Grade  --  0.5  0.5  --    Minutes  --  15  15  --    METs  --  2.34  2.5  --      Recumbant Bike   Level  --  --  --  8    RPM  --  --  --  50    Minutes  --  --  --  15    METs  --  --  --  2.2      NuStep   Level  --  2  --  2    SPM  --  80  --  80    Minutes  --  15  --  15    METs  --  2  --  2      REL-XR   Level  --  --  1  --    Minutes  --  --  15  --    METs  --  --  2.8  --      T5 Nustep   Level  --  --  2  --    Minutes  --  --  15  --    METs  --  --  2.1  --        Exercise Comments: Exercise Comments    Row Name 10/06/19 0856 10/08/19 0920 11/04/19 0900       Exercise Comments  First full day of exercise!  Patient was oriented to gym and equipment including functions, settings, policies, and procedures.  Patient's individual exercise prescription and treatment plan were reviewed.  All starting workloads were established based on the results of the 6 minute walk test done at initial orientation visit.  The plan for exercise progression was also introduced and progression will be customized based on patient's performance and goals.  First full day of exercise!  Patient was oriented to gym and equipment including functions, settings, policies, and procedures.  Patient's individual exercise prescription and treatment plan were reviewed.  All starting workloads were established based on the results of the 6 minute walk test done at initial orientation visit.  The plan for exercise progression was also introduced and progression will be customized based on patient's performance and goals.  Virtual call completed today. Calls are done every week that patient is not attending an onsite exercise session during the COVID 19 PHE Crisis.   Today's call included review of :their home exercise  Art had ED visit yesterday for raw,swollen throat. Has been prescribed a Z Pack. He is aware to finish med and be symptom free before returning  to program. He is taking it easy this week as he heals.        Exercise Goals and Review: Exercise Goals    Row Name 10/05/19 1455             Exercise Goals   Increase Physical Activity  Yes       Intervention  Provide advice, education, support and counseling about physical activity/exercise needs.;Develop an individualized exercise prescription for aerobic and resistive training based on initial evaluation findings, risk stratification, comorbidities and participant's personal goals.       Expected Outcomes  Short Term: Attend  rehab on a regular basis to increase amount of physical activity.;Long Term: Add in home exercise to make exercise part of routine and to increase amount of physical activity.;Long Term: Exercising regularly at least 3-5 days a week.       Increase Strength and Stamina  Yes       Intervention  Provide advice, education, support and counseling about physical activity/exercise needs.;Develop an individualized exercise prescription for aerobic and resistive training based on initial evaluation findings, risk stratification, comorbidities and participant's personal goals.       Expected Outcomes  Short Term: Increase workloads from initial exercise prescription for resistance, speed, and METs.;Short Term: Perform resistance training exercises routinely during rehab and add in resistance training at home;Long Term: Improve cardiorespiratory fitness, muscular endurance and strength as measured by increased METs and functional capacity (6MWT)       Able to understand and use rate of perceived exertion (RPE) scale  Yes       Intervention  Provide education and explanation on how to use RPE scale       Expected Outcomes  Long Term:  Able to use RPE to guide intensity level when exercising independently;Short Term: Able to use RPE daily in rehab to express subjective intensity level       Able to understand and use Dyspnea scale  Yes       Intervention  Provide education and explanation on how to use Dyspnea scale       Expected Outcomes  Short Term: Able to use Dyspnea scale daily in rehab to express subjective sense of shortness of breath during exertion;Long Term: Able to use Dyspnea scale to guide intensity level when exercising independently       Knowledge and understanding of Target Heart Rate Range (THRR)  Yes       Intervention  Provide education and explanation of THRR including how the numbers were predicted and where they are located for reference       Expected Outcomes  Short Term: Able to state/look  up THRR;Short Term: Able to use daily as guideline for intensity in rehab;Long Term: Able to use THRR to govern intensity when exercising independently       Able to check pulse independently  Yes       Intervention  Provide education and demonstration on how to check pulse in carotid and radial arteries.;Review the importance of being able to check your own pulse for safety during independent exercise       Expected Outcomes  Short Term: Able to explain why pulse checking is important during independent exercise;Long Term: Able to check pulse independently and accurately       Understanding of Exercise Prescription  Yes       Intervention  Provide education, explanation, and written materials on patient's individual exercise prescription       Expected Outcomes  Short  Term: Able to explain program exercise prescription;Long Term: Able to explain home exercise prescription to exercise independently          Exercise Goals Re-Evaluation : Exercise Goals Re-Evaluation    Row Name 10/06/19 0857 10/08/19 0920 10/21/19 1344 11/05/19 1006       Exercise Goal Re-Evaluation   Exercise Goals Review  Able to understand and use rate of perceived exertion (RPE) scale;Able to understand and use Dyspnea scale;Knowledge and understanding of Target Heart Rate Range (THRR);Understanding of Exercise Prescription  Able to understand and use rate of perceived exertion (RPE) scale;Knowledge and understanding of Target Heart Rate Range (THRR);Understanding of Exercise Prescription  Able to understand and use rate of perceived exertion (RPE) scale;Able to understand and use Dyspnea scale;Knowledge and understanding of Target Heart Rate Range (THRR);Able to check pulse independently;Understanding of Exercise Prescription  Increase Physical Activity;Increase Strength and Stamina;Understanding of Exercise Prescription    Comments  Reviewed RPE and dyspnea scale, THR and program prescription with pt today.  Pt voiced  understanding and was given a copy of goals to take home.  Reviewed RPE scale, THR and program prescription with pt today.  Pt voiced understanding and was given a copy of goals to take home.  Art is tolerating exercise well and will work on Catering manager on TM.  Staff will monitor progress.  Art has been doing well with rehab.  He has been holding steady at 2.1 METS on T5 NuStep and 2.8 METs on XR.  We will start to move up his workloads and continue to montior his progress.    Expected Outcomes  Short: Use RPE and dyspnea scales daily to regulate intensity. Long: Follow program prescription in THR.  Short: Use RPE daily to regulate intensity. Long: Follow program prescription in THR.  Short - exercise consistently Long - increase overall stamina  Short: Increase workloads. Long: Continue to improve stamina.       Discharge Exercise Prescription (Final Exercise Prescription Changes): Exercise Prescription Changes - 11/15/19 1700      Response to Exercise   Blood Pressure (Admit)  138/78    Blood Pressure (Exercise)  148/70    Blood Pressure (Exit)  130/80    Heart Rate (Admit)  95 bpm    Heart Rate (Exercise)  113 bpm    Heart Rate (Exit)  90 bpm    Oxygen Saturation (Admit)  95 %    Oxygen Saturation (Exercise)  94 %    Oxygen Saturation (Exit)  96 %    Rating of Perceived Exertion (Exercise)  13    Duration  Continue with 30 min of aerobic exercise without signs/symptoms of physical distress.    Intensity  THRR unchanged      Progression   Progression  Continue to progress workloads to maintain intensity without signs/symptoms of physical distress.    Average METs  2.2      Resistance Training   Training Prescription  Yes    Weight  6 lb    Reps  10-15      Interval Training   Interval Training  No      Recumbant Bike   Level  8    RPM  50    Minutes  15    METs  2.2      NuStep   Level  2    SPM  80    Minutes  15    METs  2       Nutrition:  Target  Goals:  Understanding of nutrition guidelines, daily intake of sodium <1554m, cholesterol <2035m calories 30% from fat and 7% or less from saturated fats, daily to have 5 or more servings of fruits and vegetables.  Biometrics: Pre Biometrics - 10/05/19 1455      Pre Biometrics   Height  5' 11.1" (1.806 m)    Weight  244 lb 11.2 oz (111 kg)    BMI (Calculated)  34.03    Single Leg Stand  3.12 seconds        Nutrition Therapy Plan and Nutrition Goals:   Nutrition Assessments: Nutrition Assessments - 10/05/19 1456      MEDFICTS Scores   Pre Score  77       Nutrition Goals Re-Evaluation:   Nutrition Goals Discharge (Final Nutrition Goals Re-Evaluation):   Psychosocial: Target Goals: Acknowledge presence or absence of significant depression and/or stress, maximize coping skills, provide positive support system. Participant is able to verbalize types and ability to use techniques and skills needed for reducing stress and depression.   Initial Review & Psychosocial Screening: Initial Psych Review & Screening - 10/04/19 1037      Initial Review   Current issues with  Current Anxiety/Panic;Current Depression      Family Dynamics   Good Support System?  Yes    Comments  He can look to his wife, daughter, son and his church for support.      Barriers   Psychosocial barriers to participate in program  The patient should benefit from training in stress management and relaxation.      Screening Interventions   Interventions  Provide feedback about the scores to participant;Encouraged to exercise;Program counselor consult;To provide support and resources with identified psychosocial needs    Expected Outcomes  Long Term Goal: Stressors or current issues are controlled or eliminated.;Short Term goal: Utilizing psychosocial counselor, staff and physician to assist with identification of specific Stressors or current issues interfering with healing process. Setting desired goal for each  stressor or current issue identified.;Short Term goal: Identification and review with participant of any Quality of Life or Depression concerns found by scoring the questionnaire.;Long Term goal: The participant improves quality of Life and PHQ9 Scores as seen by post scores and/or verbalization of changes       Quality of Life Scores:  Scores of 19 and below usually indicate a poorer quality of life in these areas.  A difference of  2-3 points is a clinically meaningful difference.  A difference of 2-3 points in the total score of the Quality of Life Index has been associated with significant improvement in overall quality of life, self-image, physical symptoms, and general health in studies assessing change in quality of life.  PHQ-9: Recent Review Flowsheet Data    [facility] 10/05[facility]2019 02/18/2019 08/18/2018 02/25/2017 08/27/2016   Decreased Interest 1 0 1 0 3   Down, Depressed, Hopeless 3 0 0 0 3   PHQ - 2 Score 4 0 1 0 6   Altered sleeping 0 - 3 3 3    Tired, decreased energy 3 - 3 3 3    Change in appetite 0 - 0 2 1   Feeling bad or failure about yourself  0 - 0 1 0   Trouble concentrating 1 - 0 0 0   Moving slowly or fidgety/restless 3 - 0 0 0   Suicidal thoughts 0 - 0 0 0   PHQ-9 Score 11 - 7 9 13    Difficult doing work/chores Somewhat  difficult - Not difficult at all - Very difficult     Interpretation of Total Score  Total Score Depression Severity:  1-4 = Minimal depression, 5-9 = Mild depression, 10-14 = Moderate depression, 15-19 = Moderately severe depression, 20-27 = Severe depression   Psychosocial Evaluation and Intervention: Psychosocial Evaluation - 10/04/19 1043      Psychosocial Evaluation & Interventions   Interventions  Encouraged to exercise with the program and follow exercise prescription    Comments  He can look to his wife, daughter, son and his church for support. He states he has some depression and gets short of breath easily. He gets short of  breath easily and wants to be able to do more without getting so short of breath.    Expected Outcomes  Short: attend [facility] to decrease stress and improve SOB. Long: Exercise independently to keep stress at a minimum.    Continue Psychosocial Services   Follow up required by staff       Psychosocial Re-Evaluation: Psychosocial Re-Evaluation    [place] Name 11/11/19 [id number]             Psychosocial Re-Evaluation   Current issues with  Current Stress Concerns;Current Sleep Concerns       Comments  Art states his meds take care of any stress/anxiety.  He sleeps well - has a Purple mattress and pillow.       Expected Outcomes  Short - continue to take meds as directed Long : maintain good sleep patterns          Psychosocial Discharge (Final Psychosocial Re-Evaluation): Psychosocial Re-Evaluation - 11/11/19 0842      Psychosocial Re-Evaluation   Current issues with  Current Stress Concerns;Current Sleep Concerns    Comments  Art states his meds take care of any stress/anxiety.  He sleeps well - has a Purple mattress and pillow.    Expected Outcomes  Short - continue to take meds as directed Long : maintain good sleep patterns       Education: Education Goals: Education classes will be provided on a weekly basis, covering required topics. Participant will state understanding/return demonstration of topics presented.  Learning Barriers/Preferences: Learning Barriers/Preferences - 10/04/19 1039      Learning Barriers/Preferences   Learning Barriers  None    Learning Preferences  None       Education Topics:  Initial Evaluation Education: - Verbal, written and demonstration of respiratory meds, oximetry and breathing techniques. Instruction on use of nebulizers and MDIs and importance of monitoring MDI activations.   Pulmonary Rehab from 10/05/2019 in [facility] Cardiac and Pulmonary Rehab  Date  10/04/19  Educator  [facility]  Instruction Review Code  1- Verbalizes Understanding       General Nutrition Guidelines/Fats and Fiber: -Group instruction provided by verbal, written material, models and posters to present the general guidelines for heart healthy nutrition. Gives an explanation and review of dietary fats and fiber.   Controlling Sodium/Reading Food Labels: -Group verbal and written material supporting the discussion of sodium use in heart healthy nutrition. Review and explanation with models, verbal and written materials for utilization of the food label.   Exercise Physiology & General Exercise Guidelines: - Group verbal and written instruction with models to review the exercise physiology of the cardiovascular system and associated critical values. Provides general exercise guidelines with specific guidelines to those with heart or lung disease.    Aerobic Exercise & Resistance Training: - Gives group verbal and written instruction on the various  components of exercise. Focuses on aerobic and resistive training programs and the benefits of this training and how to safely progress through these programs.   Flexibility, Balance, Mind/Body Relaxation: Provides group verbal/written instruction on the benefits of flexibility and balance training, including mind/body exercise modes such as yoga, pilates and tai chi.  Demonstration and skill practice provided.   Stress and Anxiety: - Provides group verbal and written instruction about the health risks of elevated stress and causes of high stress.  Discuss the correlation between heart/lung disease and anxiety and treatment options. Review healthy ways to manage with stress and anxiety.   Depression: - Provides group verbal and written instruction on the correlation between heart/lung disease and depressed mood, treatment options, and the stigmas associated with seeking treatment.   Exercise & Equipment Safety: - Individual verbal instruction and demonstration of equipment use and safety with use of the  equipment.   Pulmonary Rehab from 10/05/2019 in [facility] Cardiac and Pulmonary Rehab  Date  10/05/19  Educator  [facility]  Instruction Review Code  1- Verbalizes Understanding      Infection Prevention: - Provides verbal and written material to individual with discussion of infection control including proper hand washing and proper equipment cleaning during exercise session.   Pulmonary Rehab from 10/05/2019 in [facility] Cardiac and Pulmonary Rehab  Date  10/05/19  Educator  [facility]  Instruction Review Code  1- Verbalizes Understanding      Falls Prevention: - Provides verbal and written material to individual with discussion of falls prevention and safety.   Pulmonary Rehab from 10/05/2019 in [facility] Cardiac and Pulmonary Rehab  Date  10/04/19  Educator  [facility]  Instruction Review Code  1- Verbalizes Understanding      Diabetes: - Individual verbal and written instruction to review signs/symptoms of diabetes, desired ranges of glucose level fasting, after meals and with exercise. Advice that pre and post exercise glucose checks will be done for 3 sessions at entry of program.   Chronic Lung Diseases: - Group verbal and written instruction to review updates, respiratory medications, advancements in procedures and treatments. Discuss use of supplemental oxygen including available portable oxygen systems, continuous and intermittent flow rates, concentrators, personal use and safety guidelines. Review proper use of inhaler and spacers. Provide informative websites for self-education.    Energy Conservation: - Provide group verbal and written instruction for methods to conserve energy, plan and organize activities. Instruct on pacing techniques, use of adaptive equipment and posture/positioning to relieve shortness of breath.   Triggers and Exacerbations: - Group verbal and written instruction to review types of environmental triggers and ways to prevent exacerbations. Discuss weather changes, air quality  and the benefits of nasal washing. Review warning signs and symptoms to help prevent infections. Discuss techniques for effective airway clearance, coughing, and vibrations.   AED/CPR: - Group verbal and written instruction with the use of models to demonstrate the basic use of the AED with the basic ABC's of resuscitation.   Anatomy and Physiology of the Lungs: - Group verbal and written instruction with the use of models to provide basic lung anatomy and physiology related to function, structure and complications of lung disease.   Anatomy & Physiology of the Heart: - Group verbal and written instruction and models provide basic cardiac anatomy and physiology, with the coronary electrical and arterial systems. Review of Valvular disease and Heart Failure   Cardiac Medications: - Group verbal and written instruction to review commonly prescribed medications for heart disease. Reviews the medication,  class of the drug, and side effects.   Know Your Numbers and Risk Factors: -Group verbal and written instruction about important numbers in your health.  Discussion of what are risk factors and how they play a role in the disease process.  Review of Cholesterol, Blood Pressure, Diabetes, and BMI and the role they play in your overall health.   Sleep Hygiene: -Provides group verbal and written instruction about how sleep can affect your health.  Define sleep hygiene, discuss sleep cycles and impact of sleep habits. Review good sleep hygiene tips.    Other: -Provides group and verbal instruction on various topics (see comments)    Knowledge Questionnaire Score: Knowledge Questionnaire Score - 10/05/19 1458      Knowledge Questionnaire Score   Pre Score  16/18 education focus: O2 safety, ADLs        Core Components/Risk Factors/Patient Goals at Admission: Personal Goals and Risk Factors at Admission - 10/05/19 1459      Core Components/Risk Factors/Patient Goals on Admission     Weight Management  Yes;Weight Loss    Intervention  Weight Management: Develop a combined nutrition and exercise program designed to reach desired caloric intake, while maintaining appropriate intake of nutrient and fiber, sodium and fats, and appropriate energy expenditure required for the weight goal.;Weight Management: Provide education and appropriate resources to help participant work on and attain dietary goals.;Weight Management/Obesity: Establish reasonable short term and long term weight goals.;Obesity: Provide education and appropriate resources to help participant work on and attain dietary goals.    Admit Weight  244 lb 11.2 oz (111 kg)    Goal Weight: Short Term  235 lb (106.6 kg)    Goal Weight: Long Term  230 lb (104.3 kg)    Expected Outcomes  Short Term: Continue to assess and modify interventions until short term weight is achieved;Weight Maintenance: Understanding of the daily nutrition guidelines, which includes 25-35% calories from fat, 7% or less cal from saturated fats, less than 224m cholesterol, less than 1.5gm of sodium, & 5 or more servings of fruits and vegetables daily;Weight Loss: Understanding of general recommendations for a balanced deficit meal plan, which promotes 1-2 lb weight loss per week and includes a negative energy balance of [phone] kcal/d;Understanding recommendations for meals to include 15-35% energy as protein, 25-35% energy from fat, 35-60% energy from carbohydrates, less than 2027mof dietary cholesterol, 20-35 gm of total fiber daily;Understanding of distribution of calorie intake throughout the day with the consumption of 4-5 meals/snacks;Long Term: Adherence to nutrition and physical activity/exercise program aimed toward attainment of established weight goal    Tobacco Cessation  Yes    Intervention  Assist the participant in steps to quit. Provide individualized education and counseling about committing to Tobacco Cessation, relapse prevention, and  pharmacological support that can be provided by physician.;OfAdvice workerassist with locating and accessing local/national Quit Smoking programs, and support quit date choice.    Expected Outcomes  Short Term: Will demonstrate readiness to quit, by selecting a quit date.;Long Term: Complete abstinence from all tobacco products for at least 12 months from quit date.;Short Term: Will quit all tobacco product use, adhering to prevention of relapse plan.    Improve shortness of breath with ADL's  Yes    Intervention  Provide education, individualized exercise plan and daily activity instruction to help decrease symptoms of SOB with activities of daily living.    Expected Outcomes  Short Term: Improve cardiorespiratory fitness to achieve a reduction of symptoms when  performing ADLs;Long Term: Be able to perform more ADLs without symptoms or delay the onset of symptoms    Hypertension  Yes    Intervention  Provide education on lifestyle modifcations including regular physical activity/exercise, weight management, moderate sodium restriction and increased consumption of fresh fruit, vegetables, and low fat dairy, alcohol moderation, and smoking cessation.;Monitor prescription use compliance.    Expected Outcomes  Short Term: Continued assessment and intervention until BP is < 140/25m HG in hypertensive participants. < 130/89mHG in hypertensive participants with diabetes, heart failure or chronic kidney disease.;Long Term: Maintenance of blood pressure at goal levels.    Lipids  Yes    Intervention  Provide education and support for participant on nutrition & aerobic/resistive exercise along with prescribed medications to achieve LDL <7097mHDL >73m63m  Expected Outcomes  Short Term: Participant states understanding of desired cholesterol values and is compliant with medications prescribed. Participant is following exercise prescription and nutrition guidelines.;Long Term: Cholesterol  controlled with medications as prescribed, with individualized exercise RX and with personalized nutrition plan. Value goals: LDL < 70mg64mL > 40 mg.       Core Components/Risk Factors/Patient Goals Review:  Goals and Risk Factor Review    Row Name 11/11/19 0817             Core Components/Risk Factors/Patient Goals Review   Personal Goals Review  Weight Management/Obesity;Improve shortness of breath with ADL's;Hypertension;Lipids       Review  Art hasnt been checking BP at home.  [facility]  He does have a monitor at home.  He will start checking at home at least once a day.  His weight is holding at 247.  He does stretch when he gets up.  He can play with his grandkids for short periods.  He would like to be able to play more with them.       Expected Outcomes  Short :  [place] [employer] [place] Long :  walk up and down stairs without getting short of breath          Core Components/Risk Factors/Patient Goals at Discharge (Final Review):  Goals and Risk Factor Review - 11/11/19 0817      Core Components/Risk Factors/Patient Goals Review   Personal Goals Review  Weight Management/Obesity;Improve shortness of breath with ADL's;Hypertension;Lipids    Review  Art hasnt been checking BP at home.  [facility]  He does have a monitor at home.  He will start checking at home at least once a day.  His weight is holding at 247.  He does stretch when he gets up.  He can play with his grandkids for short periods.  He would like to be able to play more with them.    Expected Outcomes  Short :  [place] [employer] [place] Long :  walk up and down stairs without getting short of breath       ITP Comments: ITP Comments    Row Name 10/05/19 1232 10/06/19 0856 10/08/19 0919 10/20/19 0912 11/04/19 0900   ITP Comments  Completed 6MWT and gym orientation.  Initial ITP created and sent for review to Dr.  Mark Emily Filbertical Director.  First full day of exercise!  Patient was oriented to gym and equipment including functions, settings, policies, and procedures.  Patient's individual exercise prescription and treatment plan were reviewed.  All starting workloads were established based on the results of the 6 minute walk test done at initial orientation visit.  The plan for exercise progression was also introduced and progression will be customized based on patient's performance and goals.  First full day of exercise!  Patient was oriented to gym and equipment including functions, settings, policies, and procedures.  Patient's individual exercise prescription and treatment plan were reviewed.  All starting workloads were established based on the results of the 6 minute walk test done at initial orientation visit.  The plan for exercise progression was also introduced and progression will be customized based on patient's performance and goals.  30 day review competed . ITP sent to Dr Emily Filbert for review, changes as needed and ITP approval signature  New tp program  Virtual call completed today. Calls are done every week that patient is not attending an onsite exercise session during the COVID 19 PHE Crisis.   Today's call included review of :their home exercise  Art had ED visit yesterday for raw,swollen throat. Has been prescribed a Z Pack. He is aware to finish med and be symptom free before returning to program. He is taking it easy this week as he heals.   [place] Name 11/04/19 0910 11/17/19 1453         ITP Comments  Pt reports not being able to complete RD appointment due to recent ER visit and visiting grandchildren. Rescheduled RD evaluation with pt.  30 day review completed. ITP sent to Dr. Emily Filbert, Medical Director of Cardiac and Pulmonary Rehab. Continue with ITP unless changes are made by physician.  Department operating under reduced schedule until further notice by request from hospital  leadership.         Comments: 30 day review

## 2019-11-18 ENCOUNTER — Other Ambulatory Visit: Payer: Self-pay

## 2019-11-18 DIAGNOSIS — J4550 Severe persistent asthma, uncomplicated: Secondary | ICD-10-CM

## 2019-11-18 NOTE — Progress Notes (Signed)
Completed Initial RD Eval 

## 2019-11-29 ENCOUNTER — Encounter: Payer: Medicare Other | Attending: Internal Medicine | Admitting: *Deleted

## 2019-11-29 ENCOUNTER — Other Ambulatory Visit: Payer: Self-pay

## 2019-11-29 DIAGNOSIS — I10 Essential (primary) hypertension: Secondary | ICD-10-CM | POA: Insufficient documentation

## 2019-11-29 DIAGNOSIS — J4550 Severe persistent asthma, uncomplicated: Secondary | ICD-10-CM | POA: Insufficient documentation

## 2019-11-29 DIAGNOSIS — Z79899 Other long term (current) drug therapy: Secondary | ICD-10-CM | POA: Insufficient documentation

## 2019-11-29 DIAGNOSIS — G4730 Sleep apnea, unspecified: Secondary | ICD-10-CM | POA: Insufficient documentation

## 2019-11-29 DIAGNOSIS — Z87891 Personal history of nicotine dependence: Secondary | ICD-10-CM | POA: Insufficient documentation

## 2019-11-29 DIAGNOSIS — E7800 Pure hypercholesterolemia, unspecified: Secondary | ICD-10-CM | POA: Insufficient documentation

## 2019-11-29 DIAGNOSIS — J449 Chronic obstructive pulmonary disease, unspecified: Secondary | ICD-10-CM | POA: Insufficient documentation

## 2019-11-29 NOTE — Progress Notes (Signed)
Daily Session Note  Patient Details  Name: Cody Day MRN: [id number] Date of Birth: [birth year] Referring Provider:     Pulmonary Rehab from 10/05/2019 in [facility] Cardiac and Pulmonary Rehab  Referring Provider  [name] MD      Encounter Date: 11/29/2019  Check In: Session Check In - 11/29/19 0759      Check-In   Supervising physician immediately available to respond to emergencies  See telemetry face sheet for immediately available ER MD    Location  [facility]-Cardiac & Pulmonary Rehab    Staff Present  Heath Lark, RN, BSN, Laveda Norman, BS, ACSM CEP, Exercise Physiologist;Joseph Tessie Fass RCP,RRT,BSRT    Virtual Visit  No    Medication changes reported      No    Fall or balance concerns reported     No    Warm-up and Cool-down  Performed on first and last piece of equipment    Resistance Training Performed  Yes    VAD Patient?  No    PAD/SET Patient?  No      Pain Assessment   Currently in Pain?  No/denies          Social History   Tobacco Use  Smoking Status Former Smoker  . Packs/day: 0.25  . Years: 35.00  . Pack years: 8.75  . Types: Cigarettes  . Quit date: 10/22/1995  . Years since quitting: 24.1  Smokeless Tobacco Current User  . Types: Chew  Tobacco Comment   rarely smoked cigarettes and cigars.  states he smoked drugs mostly    Goals Met:  Proper associated with RPD/PD & O2 Sat Independence with exercise equipment Exercise tolerated well No report of cardiac concerns or symptoms  Goals Unmet:  Not Applicable  Comments: Pt able to follow exercise prescription today without complaint.  Will continue to monitor for progression.    Dr. Emily Filbert is Medical Director for [place] and [facility].

## 2019-12-02 ENCOUNTER — Encounter: Payer: Medicare Other | Admitting: *Deleted

## 2019-12-02 ENCOUNTER — Other Ambulatory Visit: Payer: Self-pay

## 2019-12-02 DIAGNOSIS — J4550 Severe persistent asthma, uncomplicated: Secondary | ICD-10-CM

## 2019-12-02 DIAGNOSIS — J449 Chronic obstructive pulmonary disease, unspecified: Secondary | ICD-10-CM | POA: Diagnosis not present

## 2019-12-02 DIAGNOSIS — Z79899 Other long term (current) drug therapy: Secondary | ICD-10-CM | POA: Diagnosis not present

## 2019-12-02 DIAGNOSIS — I10 Essential (primary) hypertension: Secondary | ICD-10-CM | POA: Diagnosis not present

## 2019-12-02 DIAGNOSIS — G4730 Sleep apnea, unspecified: Secondary | ICD-10-CM | POA: Diagnosis not present

## 2019-12-02 DIAGNOSIS — Z87891 Personal history of nicotine dependence: Secondary | ICD-10-CM | POA: Diagnosis not present

## 2019-12-02 DIAGNOSIS — E7800 Pure hypercholesterolemia, unspecified: Secondary | ICD-10-CM | POA: Diagnosis not present

## 2019-12-02 NOTE — Progress Notes (Signed)
Daily Session Note  Patient Details  Name: Cody Day MRN: [id number] Date of Birth: [birth year] Referring Provider:     Pulmonary Rehab from 10/05/2019 in [facility] Cardiac and Pulmonary Rehab  Referring Provider  [name] MD      Encounter Date: 12/02/2019  Check In: Session Check In - 12/02/19 0850      Check-In   Supervising physician immediately available to respond to emergencies  See telemetry face sheet for immediately available ER MD    Location  [facility]-Cardiac & Pulmonary Rehab    Staff Present  Heath Lark, RN, BSN, CCRP;Jessica Traver, MA, RCEP, CCRP, CCET;Melissa Wilkinson RDN, LDN;Joseph [employer] Visit  No    Medication changes reported      No    Fall or balance concerns reported     No    Warm-up and Cool-down  Performed on first and last piece of equipment    Resistance Training Performed  Yes    VAD Patient?  No    PAD/SET Patient?  No      Pain Assessment   Currently in Pain?  No/denies          Social History   Tobacco Use  Smoking Status Former Smoker  . Packs/day: 0.25  . Years: 35.00  . Pack years: 8.75  . Types: Cigarettes  . Quit date: 10/22/1995  . Years since quitting: 24.1  Smokeless Tobacco Current User  . Types: Chew  Tobacco Comment   rarely smoked cigarettes and cigars.  states he smoked drugs mostly    Goals Met:  Independence with exercise equipment Exercise tolerated well Personal goals reviewed No report of cardiac concerns or symptoms  Goals Unmet:  Not Applicable  Comments:Pt able to follow exercise prescription today without complaint.  Will continue to monitor for progression.  Reviewed home exercise with pt today.  Pt plans to walking at track at school for exercise.  Reviewed THR, pulse, RPE, sign and symptoms, and when to call 911 or MD.  Also discussed weather considerations and indoor options.  Pt voiced understanding.  Dr. Emily Filbert is Medical Director for [place]  and [facility].

## 2019-12-02 NOTE — Progress Notes (Signed)
Reviewed home exercise with pt today.  Pt plans to walking at track at school for exercise.  Reviewed THR, pulse, RPE, sign and symptoms, and when to call 911 or MD.  Also discussed weather considerations and indoor options.  Pt voiced understanding.

## 2019-12-06 ENCOUNTER — Encounter: Payer: Medicare Other | Admitting: *Deleted

## 2019-12-06 ENCOUNTER — Other Ambulatory Visit: Payer: Self-pay

## 2019-12-06 DIAGNOSIS — I10 Essential (primary) hypertension: Secondary | ICD-10-CM | POA: Diagnosis not present

## 2019-12-06 DIAGNOSIS — J4550 Severe persistent asthma, uncomplicated: Secondary | ICD-10-CM | POA: Diagnosis not present

## 2019-12-06 DIAGNOSIS — G4730 Sleep apnea, unspecified: Secondary | ICD-10-CM | POA: Diagnosis not present

## 2019-12-06 DIAGNOSIS — J449 Chronic obstructive pulmonary disease, unspecified: Secondary | ICD-10-CM | POA: Diagnosis not present

## 2019-12-06 DIAGNOSIS — E7800 Pure hypercholesterolemia, unspecified: Secondary | ICD-10-CM | POA: Diagnosis not present

## 2019-12-06 DIAGNOSIS — Z79899 Other long term (current) drug therapy: Secondary | ICD-10-CM | POA: Diagnosis not present

## 2019-12-06 NOTE — Progress Notes (Signed)
Daily Session Note  Patient Details  Name: Cody Day MRN: [id number] Date of Birth: October 31, 1955 Referring Provider:     Pulmonary Rehab from 10/05/2019 in [facility] Cardiac and Pulmonary Rehab  Referring Provider  [name] MD      Encounter Date: 12/06/2019  Check In: Session Check In - 12/06/19 0821      Check-In   Supervising physician immediately available to respond to emergencies  See telemetry face sheet for immediately available ER MD    Location  [facility]-Cardiac & Pulmonary Rehab    Staff Present  Heath Lark, RN, BSN, CCRP;Jessica Creedmoor, MA, RCEP, CCRP, [place], BS, ACSM CEP, Exercise Physiologist;Joseph Douglas RCP,RRT,BSRT    Virtual Visit  No    Medication changes reported      No    Fall or balance concerns reported     No    Warm-up and Cool-down  Performed on first and last piece of equipment    Resistance Training Performed  Yes    VAD Patient?  No    PAD/SET Patient?  No      Pain Assessment   Currently in Pain?  No/denies          Social History   Tobacco Use  Smoking Status Former Smoker  . Packs/day: 0.25  . Years: 35.00  . Pack years: 8.75  . Types: Cigarettes  . Quit date: 10/22/1995  . Years since quitting: 24.1  Smokeless Tobacco Current User  . Types: Chew  Tobacco Comment   rarely smoked cigarettes and cigars.  states he smoked drugs mostly    Goals Met:  Independence with exercise equipment Exercise tolerated well No report of cardiac concerns or symptoms  Goals Unmet:  Not Applicable  Comments: Pt able to follow exercise prescription today without complaint.  Will continue to monitor for progression.    Dr. Emily Filbert is Medical Director for [place] and [facility].

## 2019-12-10 ENCOUNTER — Ambulatory Visit: Payer: Medicare Other | Admitting: Cardiovascular Disease

## 2019-12-13 ENCOUNTER — Encounter: Payer: Medicare Other | Admitting: *Deleted

## 2019-12-13 ENCOUNTER — Encounter: Payer: Self-pay | Admitting: Acute Care

## 2019-12-13 ENCOUNTER — Other Ambulatory Visit: Payer: Self-pay

## 2019-12-13 ENCOUNTER — Ambulatory Visit (INDEPENDENT_AMBULATORY_CARE_PROVIDER_SITE_OTHER): Payer: Medicare Other | Admitting: Acute Care

## 2019-12-13 ENCOUNTER — Encounter: Payer: Medicare Other | Admitting: Acute Care

## 2019-12-13 VITALS — BP 148/78 | HR 99 | Temp 98.0°F | Resp 18 | Ht 71.0 in | Wt 253.4 lb

## 2019-12-13 DIAGNOSIS — J4550 Severe persistent asthma, uncomplicated: Secondary | ICD-10-CM

## 2019-12-13 DIAGNOSIS — F1910 Other psychoactive substance abuse, uncomplicated: Secondary | ICD-10-CM

## 2019-12-13 DIAGNOSIS — R42 Dizziness and giddiness: Secondary | ICD-10-CM

## 2019-12-13 DIAGNOSIS — G4730 Sleep apnea, unspecified: Secondary | ICD-10-CM | POA: Diagnosis not present

## 2019-12-13 DIAGNOSIS — E7800 Pure hypercholesterolemia, unspecified: Secondary | ICD-10-CM | POA: Diagnosis not present

## 2019-12-13 DIAGNOSIS — Z79899 Other long term (current) drug therapy: Secondary | ICD-10-CM | POA: Diagnosis not present

## 2019-12-13 DIAGNOSIS — G4734 Idiopathic sleep related nonobstructive alveolar hypoventilation: Secondary | ICD-10-CM

## 2019-12-13 DIAGNOSIS — I517 Cardiomegaly: Secondary | ICD-10-CM

## 2019-12-13 DIAGNOSIS — J441 Chronic obstructive pulmonary disease with (acute) exacerbation: Secondary | ICD-10-CM | POA: Diagnosis not present

## 2019-12-13 DIAGNOSIS — J449 Chronic obstructive pulmonary disease, unspecified: Secondary | ICD-10-CM | POA: Diagnosis not present

## 2019-12-13 DIAGNOSIS — I10 Essential (primary) hypertension: Secondary | ICD-10-CM | POA: Diagnosis not present

## 2019-12-13 NOTE — Patient Instructions (Addendum)
It is good to see you today. We will make sure to place an order for Pulmonary Function Tests You need to have these done so we can best determine inhaler regimen. - Continue using Stiolto 2 puffs once daily - Continue Flovent hfa 278mcg two puffs twice daily - Remember to rinse mouth after use - Continue Ventolin hfa 2 puffs every 6 hours as needed sob/wheezing - We will consider triple therapy once you have completed PFT's - We will consider addition of Biologic therapy if PFT's confirm COPD/ Asthma overlap - For your night time oxygen levels, please continue wearing your night time oxygen at 2 L [place] - If you want to consider wearing a CPAP machine, we can consider a new sleep study. - Note your daily symptoms > remember "red flags" for COPD:  Increase in cough, increase in sputum production, increase in shortness of breath or activity intolerance. If you notice these symptoms, please call to be seen.  - Continue cardiopulmonary rehab as you have been doing.   - Please contact office for sooner follow up if symptoms do not improve or worsen or seek emergency care  - Follow up in 3 months with Dr. Mortimer Fries. - Please contact office for sooner follow up if symptoms do not improve or worsen or seek emergency care

## 2019-12-13 NOTE — Addendum Note (Signed)
Addended by: Maryanna Shape A on: 12/13/2019 02:30 PM   Modules accepted: Orders

## 2019-12-13 NOTE — Progress Notes (Signed)
History of Present Illness Cody Day is a 64 y.o. male former smoker  with 35 pack year smoking history ( Quit 1907) and PMH significant for COPD, chronic asthma, allergic rhinitis, OSA (non-compliant with CPAP), nocturnal hypoxia, cardiomyopathy, CAD, HTN, polysubstance abuse, prediabetes, HLD. Former patient of Dr. Pennie Banter, now following with Dr. Mortimer Fries. Maintained on Stiolto respimat and Flovent hfa. Pending Home Sleep Study and PFT's   12/13/2019 2 month Follow up: 64 year old male, former smoker quit in 1997 (35 pack year hx). PMH significant for COPD, chronic asthma, allergic rhinitis, OSA (non-compliant with CPAP), nocturnal hypoxia, cardiomyopathy, CAD, HTN, polysubstance abuse, prediabetes, HLD. Former patient of Dr. Pennie Banter, now following with Dr. Mortimer Fries. Last seen by pulmonary NP on 10/01/19.   He had a televisit on 09/20/19 and was treated empirically with course of steroids for dyspnea and shortness of breath. He returned on 10/01/19 for 1 week follow-up and was unable to complete walk test d/t shortness of breath, dizziness, tachycardia. Recommended ED eval d/t exertional chest pain, dyspnea and tachycardia. While in ED no baseline labs were drawn or CXR obtained.   10/08/2019 Patient seen for  follow-up visit. He still endorsed  shortness of breath with minimal activity,  wheezing when lying down at night and associated chest tightness. Respiratory symptoms  with steroids (reports three exacerbations this past year). He averages  using his ventolin inhaler twice daily. He attends  pulmonary rehab .  Labs stable, Eos absolute 300. BNP normal and D-dimer negative. CXR showed no acute abnormalities.  PFTs are pending and echocardiogram was completed 09/2019. Results as noted below. He is intolerant to cpap, wears nocturnal oxygen.   Additionally he experiences daily headaches, balance troubles and intermittent dizziness which are not new and have been ongoing for the last 6- 8  months.  States that his gait is improving since rehab. He has difficulty retaining new information. He has a baseline tremor which he has seen neurology for in the past. He takes  celexa, dose increased to 40mg . Denies changes in speech or vision. Recent MRI brain 09/2019>> without evidence of recent infarction, intracranial hemorrhage, or mass.  Pt. Presents today stating he is doing well. BP is elevated . He has just come from pulmonary rehab. He states his HR gets elevated after he exercises. He states he does check his HR at home. He states it usually runs 70-76 with rest. He states with activity it is 80-91. He states it never gets above 100.   He states he has been compliant with his inhalers. He is in agreement with PFT's. We will place an order. He had an ED visit 11/03/2019.  for Bilateral tender cervical  lymphadenopathy CMET,CBC unremarkable He was given azithromycin for possible bacterial source of lymphadenitis. He states the swelling resolved after 3 days of antibiotic treatment. He was seen by Dr. Diona Browner for swollen glands 11/16/2019. They have resolved . He endorses a stable interval. He states he is using his rescue inhaler once a day. He states his secretions are worse in the morning. He endorses snoring, but does not want to try CPAP. He states he is wearing his nocturnal oxygen.     Test Results: 09/2019 Echo Left ventricular ejection fraction, by visual estimation, is 50 to 55%. The left ventricle has low normal function. The left ventricle has no regional wall motion abnormalities. Moderately increased left ventricular posterior wall thickness. There is moderately increased left ventricular hypertrophy. Right Ventricle: The right ventricular size is normal.  No increase in right ventricular wall thickness. Global RV systolic function is has normal systolic function. The tricuspid regurgitant velocity is 1.45 m/s, and with an assumed right atrial pressure of 10 mmHg, the estimated right  ventricular systolic pressure is normal at 18.4 mmHg.  MRI Brain 10/19/2019 No evidence of recent infarction, intracranial hemorrhage, or mass.  10/01/2019 CXR Normal heart size, mediastinal contours, and pulmonary vascularity.  Lungs clear.  No pulmonary infiltrate, pleural effusion or pneumothorax.  Significant testing: 01/10/2017-eosinophils relative 1, eosinophils absolute 0  07/20/2018-chest x-ray-lower lung volumes without acute chest process  03/23/2015-high-resolution CT chest-no findings of interstitial lung disease, no acute findings in the thorax, arthrosclerosis  07/21/2018- echocardiogram-LV ejection fraction 123456, grade 1 diastolic dysfunction  99991111 3.4 (70% predicted), ratio 72, FEV1 2.5 (64% predicted)  03/23/2015-pulmonary function test-FVC 4.34 (87% predicted), postbronchodilator ratio 70, postbronchodilator FEV1 3.35 (88% predicted), positive bronchodilator response in FEV1, mid flow reversibility,, DLCO 41.19 (122% predicted)  12/22/2011-sleep study-AHI 79, SaO2 low 65%   CBC Latest Ref Rng & Units 11/03/2019 10/04/2019 10/01/2019  WBC 4.0 - 10.5 K/uL 8.6 8.6 13.7(H)  Hemoglobin 13.0 - 17.0 g/dL 13.3 13.0 13.5  Hematocrit 39.0 - 52.0 % 44.1 43.5 43.9  Platelets 150 - 400 K/uL 369 321 370    BMP Latest Ref Rng & Units 11/03/2019 10/04/2019 08/30/2019  Glucose 70 - 99 mg/dL 145(H) 115(H) 103(H)  BUN 8 - 23 mg/dL 13 12 9   Creatinine 0.61 - 1.24 mg/dL 0.75 0.65 0.79  Sodium 135 - 145 mmol/L 133(L) 134(L) 136  Potassium 3.5 - 5.1 mmol/L 3.7 3.8 4.1  Chloride 98 - 111 mmol/L 97(L) 100 100  CO2 22 - 32 mmol/L 27 24 30   Calcium 8.9 - 10.3 mg/dL 9.3 9.0 8.8    BNP    Component Value Date/Time   BNP 34.0 10/04/2019 1137    ProBNP No results found for: PROBNP  PFT    Component Value Date/Time   FEV1PRE 2.88 03/23/2015 0909   FEV1POST 3.35 03/23/2015 0909   FVCPRE 4.34 03/23/2015 0909   FVCPOST 4.75 03/23/2015 0909   TLC 7.00  03/23/2015 0909   DLCOUNC 41.19 03/23/2015 0909   PREFEV1FVCRT 66 03/23/2015 0909   PSTFEV1FVCRT 70 03/23/2015 0909    No results found.   Past medical hx Past Medical History:  Diagnosis Date  . Achilles tendon rupture 10/11   and repair  . Adenomatous polyp of colon 04/06/2010   Adenomatous polyps 3 in the descending colon, times one in the ascending colon [facility])  . Asthma   . COPD (chronic obstructive pulmonary disease) ([place])   . Gout   . Hemorrhoids   . Hypercholesteremia   . Hypertension   . Sleep apnea    doesn't wear CPAP  . Syncope 10/11   in settin gof asthma exacerbation w coughing. Ech (10/11): EF > 55%, mild LVH, grade I diastolic dysfunction, nomral RV size and systolic function,. normal valves. Carotid US (10/11): minimal disease     Social History   Tobacco Use  . Smoking status: Former Smoker    Packs/day: 0.25    Years: 35.00    Pack years: 8.75    Types: Cigarettes    Quit date: 10/22/1995    Years since quitting: 24.1  . Smokeless tobacco: Current User    Types: Chew  . Tobacco comment: rarely smoked cigarettes and cigars.  states he smoked drugs mostly  Substance Use Topics  . Alcohol use: Yes    Alcohol/week: 0.0 standard drinks  Comment: 6 beers daily; used to use cocaine and methamphetamine  . Drug use: No    Types: Marijuana, Methamphetamines    Comment: former smokerx 27 years    Mr.Mccall reports that he quit smoking about 24 years ago. His smoking use included cigarettes. He has a 8.75 pack-year smoking history. His smokeless tobacco use includes chew. He reports current alcohol use. He reports that he does not use drugs.  Tobacco Cessation: Former smoker quit 1997 35 pack year smoking history History of substance abuse.  Past surgical hx, Family hx, Social hx all reviewed.  Current Outpatient Medications on File Prior to Visit  Medication Sig  . albuterol (VENTOLIN HFA) 108 (90 Base) MCG/ACT inhaler Inhale 2 puffs into  the lungs every 4 (four) hours as needed for wheezing or shortness of breath.  . allopurinol (ZYLOPRIM) 300 MG tablet TAKE 1 TABLET BY MOUTH DAILY  . citalopram (CELEXA) 40 MG tablet Take 1 tablet (40 mg total) by mouth daily.  . fluticasone (FLOVENT HFA) 220 MCG/ACT inhaler Inhale 2 puffs into the lungs 2 (two) times daily.  Marland Kitchen lisinopril (ZESTRIL) 20 MG tablet TAKE 1 TABLET BY MOUTH DAILY  . montelukast (SINGULAIR) 10 MG tablet TAKE 1 TABLET BY MOUTH DAILY  . Multiple Vitamin (MULTIVITAMIN WITH MINERALS) TABS tablet Take 1 tablet by mouth daily.  . simvastatin (ZOCOR) 20 MG tablet TAKE 1 TABLET BY MOUTH DAILY  . STIOLTO RESPIMAT 2.5-2.5 MCG/ACT AERS USE 2 INHALATIONS EVERY DAY  . testosterone cypionate (DEPOTESTOSTERONE CYPIONATE) 200 MG/ML injection Inject 0.3 mLs (60 mg total) into the muscle once a week.   No current facility-administered medications on file prior to visit.     Allergies  Allergen Reactions  . Levaquin [Levofloxacin In D5w]     Severe headaches, skin tingling/redness  . Penicillins Hives and Rash    Has patient had a PCN reaction causing immediate rash, facial/tongue/throat swelling, SOB or lightheadedness with hypotension: Yes Has patient had a PCN reaction causing severe rash involving mucus membranes or skin necrosis: Unknown Has patient had a PCN reaction that required hospitalization: No Has patient had a PCN reaction occurring within the last 10 years: No - Childhood reaction If all of the above answers are "NO", then may proceed with Cephalosporin use.     Review Of Systems:  Constitutional:   No  weight loss, night sweats,  Fevers, chills, fatigue, or  lassitude.  HEENT:   No headaches,  Difficulty swallowing,  Tooth/dental problems, or  Sore throat,                No sneezing, itching, ear ache, nasal congestion, post nasal drip,   CV:  No chest pain,  Orthopnea, PND, swelling in lower extremities, anasarca, dizziness, palpitations, syncope.   GI  No  heartburn, indigestion, abdominal pain, nausea, vomiting, diarrhea, change in bowel habits, loss of appetite, bloody stools.   Resp: No shortness of breath with exertion or at rest.  No excess mucus, no productive cough,  No non-productive cough,  No coughing up of blood.  No change in color of mucus.  No wheezing.  No chest wall deformity  Skin: no rash or lesions.  GU: no dysuria, change in color of urine, no urgency or frequency.  No flank pain, no hematuria   MS:  No joint pain or swelling.  No decreased range of motion.  No back pain.  Psych:  No change in mood or affect. No depression or anxiety.  No memory loss.  Vital Signs BP 148/78, HR 99 Sat on RA 97%, RR 18 Weight 253.4 Temp 98  Physical Exam:  General- No distress,  A&Ox3 ENT: No sinus tenderness, TM clear, pale nasal mucosa, no oral exudate,no post nasal drip, no LAN Cardiac: S1, S2, regular rate and rhythm, no murmur Chest: No wheeze/ rales/ dullness; no accessory muscle use, no nasal flaring, no sternal retractions Abd.: Soft Non-tender Ext: No clubbing cyanosis, edema Neuro:  normal strength Skin: No rashes, warm and dry Psych: normal mood and behavior   Assessment/Plan  COPD exacerbation (HCC) - COPD with likely asthmatic component (reports 3 exacerbations requiring oral steroids in 2020) - Eosinophil absolute 300, CXR no acute abnormalities  - Continue Stiolto 2 puffs once daily - Continue Flovent hfa 279mcg two puffs twice daily - Continue Ventolin hfa 2 puffs every 6 hours as needed sob/wheezing - After PFTs completed will consider triple therapy once you have completed PFT's - We will consider addition of Biologic therapy if PFT's confirm COPD/ Asthma overlap - Continue Pulmonary Rehab, as you have been benefiting from therapy.    Nocturnal hypoxia - Intolerant to CPAP - Continue 2L nocturnal oxygen, renew order with DME  - Consider home sleep study if patient wants to commit to CPAP  therapy  Dizziness - Ongoing for 6 months with associated daily HA - Neuro assessment grossly intact except for known tremor  - MRI wo contrast shows no acute issues - Declined neurology referral   Polysubstance abuse [facility]) - Denies current ETOH use - Denies other recreational substance abuse   LVH - Follow up with Cards 02/03/2020 as is scheduled - Continue cardiopulmonary rehab    Magdalen Spatz, NP 12/13/2019 12:16 PM

## 2019-12-13 NOTE — Progress Notes (Signed)
Daily Session Note  Patient Details  Name: Cody Day MRN: [id number] Date of Birth: 02-26-1956 Referring Provider:     Pulmonary Rehab from 10/05/2019 in [facility] Cardiac and Pulmonary Rehab  Referring Provider  [name] MD      Encounter Date: 12/13/2019  Check In: Session Check In - 12/13/19 [id number]      Check-In   Supervising physician immediately available to respond to emergencies  See telemetry face sheet for immediately available ER MD    Location  [facility]-Cardiac & Pulmonary Rehab    Staff Present  Heath Lark, RN, BSN, Laveda Norman, BS, ACSM CEP, Exercise Physiologist;Joseph Tessie Fass RCP,RRT,BSRT    Virtual Visit  No    Medication changes reported      No    Fall or balance concerns reported     No    Warm-up and Cool-down  Performed on first and last piece of equipment    Resistance Training Performed  Yes    VAD Patient?  No    PAD/SET Patient?  No      Pain Assessment   Currently in Pain?  No/denies          Social History   Tobacco Use  Smoking Status Former Smoker  . Packs/day: 0.25  . Years: 35.00  . Pack years: 8.75  . Types: Cigarettes  . Quit date: 10/22/1995  . Years since quitting: 24.1  Smokeless Tobacco Current User  . Types: Chew  Tobacco Comment   rarely smoked cigarettes and cigars.  states he smoked drugs mostly    Goals Met:  Proper associated with RPD/PD & O2 Sat Independence with exercise equipment Personal goals reviewed No report of cardiac concerns or symptoms  Goals Unmet:  Not Applicable  Comments: Pt able to follow exercise prescription today without complaint.  Will continue to monitor for progression.    Dr. Emily Filbert is Medical Director for [place] and [facility].

## 2019-12-13 NOTE — Progress Notes (Signed)
Cardiac Individual Treatment Plan  Patient Details  Name: Cody Day MRN: [id number] Date of Birth: 05-Sep-1956 Referring Provider:     Pulmonary Rehab from 10/05/2019 in [facility] Cardiac and Pulmonary Rehab  Referring Provider  [name] MD      Initial Encounter Date:    Pulmonary Rehab from 10/05/2019 in [facility] Cardiac and Pulmonary Rehab  Date  10/05/19      Visit Diagnosis: Severe persistent chronic asthma without complication  Patient's Home Medications on Admission:  Current Outpatient Medications:  .  albuterol (VENTOLIN HFA) 108 (90 Base) MCG/ACT inhaler, Inhale 2 puffs into the lungs every 4 (four) hours as needed for wheezing or shortness of breath., Disp: 1 Inhaler, Rfl: 10 .  allopurinol (ZYLOPRIM) 300 MG tablet, TAKE 1 TABLET BY MOUTH DAILY, Disp: 90 tablet, Rfl: 1 .  citalopram (CELEXA) 40 MG tablet, Take 1 tablet (40 mg total) by mouth daily., Disp: 90 tablet, Rfl: 1 .  fluticasone (FLOVENT HFA) 220 MCG/ACT inhaler, Inhale 2 puffs into the lungs 2 (two) times daily., Disp: 1 Inhaler, Rfl: 5 .  lisinopril (ZESTRIL) 20 MG tablet, TAKE 1 TABLET BY MOUTH DAILY, Disp: 90 tablet, Rfl: 1 .  montelukast (SINGULAIR) 10 MG tablet, TAKE 1 TABLET BY MOUTH DAILY, Disp: 90 tablet, Rfl: 1 .  Multiple Vitamin (MULTIVITAMIN WITH MINERALS) TABS tablet, Take 1 tablet by mouth daily., Disp: 30 tablet, Rfl: 2 .  simvastatin (ZOCOR) 20 MG tablet, TAKE 1 TABLET BY MOUTH DAILY, Disp: 90 tablet, Rfl: 3 .  STIOLTO RESPIMAT 2.5-2.5 MCG/ACT AERS, USE 2 INHALATIONS EVERY DAY, Disp: 4 g, Rfl: 5 .  testosterone cypionate (DEPOTESTOSTERONE CYPIONATE) 200 MG/ML injection, Inject 0.3 mLs (60 mg total) into the muscle once a week., Disp: 6 mL, Rfl: 0  Past Medical History: Past Medical History:  Diagnosis Date  . Achilles tendon rupture 10/11   and repair  . Adenomatous polyp of colon 04/06/2010   Adenomatous polyps 3 in the descending colon, times one in the ascending colon [facility])  .  Asthma   . COPD (chronic obstructive pulmonary disease) ([place])   . Gout   . Hemorrhoids   . Hypercholesteremia   . Hypertension   . Sleep apnea    doesn't wear CPAP  . Syncope 10/11   in settin gof asthma exacerbation w coughing. Ech (10/11): EF > 55%, mild LVH, grade I diastolic dysfunction, nomral RV size and systolic function,. normal valves. Carotid US (10/11): minimal disease    Tobacco Use: Social History   Tobacco Use  Smoking Status Former Smoker  . Packs/day: 0.25  . Years: 35.00  . Pack years: 8.75  . Types: Cigarettes  . Quit date: 10/22/1995  . Years since quitting: 24.1  Smokeless Tobacco Current User  . Types: Chew  Tobacco Comment   rarely smoked cigarettes and cigars.  states he smoked drugs mostly    Labs: Recent Review Flowsheet Data    Labs for ITP Cardiac and Pulmonary Rehab Latest Ref Rng & Units 08/23/2016 04/01/2018 07/20/2018 02/15/2019 08/30/2019   Cholestrol 0 - 200 mg/dL 174 168 - 136 123   LDLCALC 0 - 99 mg/dL - 75 - 71 57   LDLDIRECT mg/dL 53.0 - - - -   HDL >39.00 mg/dL 97.10 81.60 - 44.50 51.00   Trlycerides 0.0 - 149.0 mg/dL 257.0(H) 58.0 - 103.0 76.0   Hemoglobin A1c 4.6 - 6.5 % 6.2 6.1 5.7(H) 6.8(H) 6.3   TCO2 0 - 100 mmol/L - - - - -  Exercise Target Goals: Exercise Program Goal: Individual exercise prescription set using results from initial 6 min walk test and THRR while considering  patient's activity barriers and safety.   Exercise Prescription Goal: Initial exercise prescription builds to 30-45 minutes a day of aerobic activity, 2-3 days per week.  Home exercise guidelines will be given to patient during program as part of exercise prescription that the participant will acknowledge.  Activity Barriers & Risk Stratification: Activity Barriers & Cardiac Risk Stratification - 10/05/19 1451      Activity Barriers & Cardiac Risk Stratification   Activity Barriers  Deconditioning;Muscular Weakness;Other (comment);Shortness of  Breath;Balance Concerns    Comments  shuffles feet, tremors       6 Minute Walk: 6 Minute Walk    Row Name 10/05/19 1441         6 Minute Walk   Phase  Initial     Distance  994 feet     Walk Time  6 minutes     # of Rest Breaks  0     MPH  1.88     METS  2.82     RPE  15     Perceived Dyspnea   3     VO2 Peak  9.88     Symptoms  Yes (comment)     Comments  hip pain 3/10, SOB, knee pain 3/10, SOB     Resting HR  90 bpm     Resting BP  112/64     Resting Oxygen Saturation   97 %     Exercise Oxygen Saturation  during 6 min walk  95 %     Max Ex. HR  125 bpm     Max Ex. BP  146/64     2 Minute Post BP  134/70       Interval HR   1 Minute HR  99     2 Minute HR  119     3 Minute HR  114     4 Minute HR  102     5 Minute HR  125     6 Minute HR  98     2 Minute Post HR  92     Interval Heart Rate?  Yes       Interval Oxygen   Interval Oxygen?  Yes     Baseline Oxygen Saturation %  97 %     1 Minute Oxygen Saturation %  96 %     1 Minute Liters of Oxygen  0 L Room Air     2 Minute Oxygen Saturation %  95 %     2 Minute Liters of Oxygen  0 L     3 Minute Oxygen Saturation %  96 %     3 Minute Liters of Oxygen  0 L     4 Minute Oxygen Saturation %  95 %     4 Minute Liters of Oxygen  0 L     5 Minute Oxygen Saturation %  95 %     5 Minute Liters of Oxygen  0 L     6 Minute Oxygen Saturation %  96 %     6 Minute Liters of Oxygen  0 L     2 Minute Post Oxygen Saturation %  97 %     2 Minute Post Liters of Oxygen  0 L        Oxygen Initial Assessment: Oxygen Initial Assessment -  10/04/19 1036      Home Oxygen   Home Oxygen Device  Home Concentrator    Sleep Oxygen Prescription  Continuous    Liters per minute  2    Home Exercise Oxygen Prescription  None    Home at Rest Exercise Oxygen Prescription  None    Compliance with Home Oxygen Use  Yes      Initial 6 min Walk   Oxygen Used  None      Program Oxygen Prescription   Program Oxygen Prescription   None      Intervention   Short Term Goals  To learn and exhibit compliance with exercise, home and travel O2 prescription;To learn and understand importance of maintaining oxygen saturations>88%;To learn and demonstrate proper use of respiratory medications;To learn and demonstrate proper pursed lip breathing techniques or other breathing techniques.;To learn and understand importance of monitoring SPO2 with pulse oximeter and demonstrate accurate use of the pulse oximeter.    Long  Term Goals  Verbalizes importance of monitoring SPO2 with pulse oximeter and return demonstration;Exhibits proper breathing techniques, such as pursed lip breathing or other method taught during program session;Demonstrates proper use of MDI's;Exhibits compliance with exercise, home and travel O2 prescription;Maintenance of O2 saturations>88%;Compliance with respiratory medication       Oxygen Re-Evaluation: Oxygen Re-Evaluation    Row Name 10/06/19 0858 11/11/19 0844 12/02/19 0842         Program Oxygen Prescription   Program Oxygen Prescription  None  None  None       Home Oxygen   Home Oxygen Device  Home Concentrator  Home Concentrator  Home Concentrator     Sleep Oxygen Prescription  Continuous  Continuous  Continuous     Liters per minute  2  2  2      Home Exercise Oxygen Prescription  None  None  None     Home at Rest Exercise Oxygen Prescription  None  None  None     Compliance with Home Oxygen Use  Yes  Yes  Yes       Goals/Expected Outcomes   Short Term Goals  To learn and understand importance of monitoring SPO2 with pulse oximeter and demonstrate accurate use of the pulse oximeter.;To learn and understand importance of maintaining oxygen saturations>88%;To learn and demonstrate proper pursed lip breathing techniques or other breathing techniques.  To learn and exhibit compliance with exercise, home and travel O2 prescription;To learn and understand importance of monitoring SPO2 with pulse oximeter  and demonstrate accurate use of the pulse oximeter.;To learn and understand importance of maintaining oxygen saturations>88%;To learn and demonstrate proper pursed lip breathing techniques or other breathing techniques.;To learn and demonstrate proper use of respiratory medications  To learn and exhibit compliance with exercise, home and travel O2 prescription;To learn and understand importance of monitoring SPO2 with pulse oximeter and demonstrate accurate use of the pulse oximeter.;To learn and understand importance of maintaining oxygen saturations>88%;To learn and demonstrate proper pursed lip breathing techniques or other breathing techniques.;To learn and demonstrate proper use of respiratory medications     Long  Term Goals  Verbalizes importance of monitoring SPO2 with pulse oximeter and return demonstration;Maintenance of O2 saturations>88%;Exhibits proper breathing techniques, such as pursed lip breathing or other method taught during program session  Exhibits compliance with exercise, home and travel O2 prescription;Verbalizes importance of monitoring SPO2 with pulse oximeter and return demonstration;Maintenance of O2 saturations>88%;Exhibits proper breathing techniques, such as pursed lip breathing or other method taught during program session;Compliance  with respiratory medication;Demonstrates proper use of MDI's  Exhibits compliance with exercise, home and travel O2 prescription;Verbalizes importance of monitoring SPO2 with pulse oximeter and return demonstration;Maintenance of O2 saturations>88%;Exhibits proper breathing techniques, such as pursed lip breathing or other method taught during program session;Compliance with respiratory medication;Demonstrates proper use of MDI's     Comments  Reviewed PLB technique with pt.  Talked about how it works and it's importance in maintaining their exercise saturations.  Reviewed PLB - pt is using inhalers as directed.  Pateient doesnt currently have  oximeter at home.  He will order one and check O2 at home  Art is doing well with oxygen at night, but he does still snore.  He is using PLB at home.  He is keeping a close eye on his saturations and they are good for most part.     Goals/Expected Outcomes  Short: Become more profiecient at using PLB.   Long: Become independent at using PLB.  Short :  continue to practice PLB Long ; become independent using PLB  Short: Continue to montior saturations and use PLB.  Long: Continue to manage pulmonary disease.        Oxygen Discharge (Final Oxygen Re-Evaluation): Oxygen Re-Evaluation - 12/02/19 0842      Program Oxygen Prescription   Program Oxygen Prescription  None      Home Oxygen   Home Oxygen Device  Home Concentrator    Sleep Oxygen Prescription  Continuous    Liters per minute  2    Home Exercise Oxygen Prescription  None    Home at Rest Exercise Oxygen Prescription  None    Compliance with Home Oxygen Use  Yes      Goals/Expected Outcomes   Short Term Goals  To learn and exhibit compliance with exercise, home and travel O2 prescription;To learn and understand importance of monitoring SPO2 with pulse oximeter and demonstrate accurate use of the pulse oximeter.;To learn and understand importance of maintaining oxygen saturations>88%;To learn and demonstrate proper pursed lip breathing techniques or other breathing techniques.;To learn and demonstrate proper use of respiratory medications    Long  Term Goals  Exhibits compliance with exercise, home and travel O2 prescription;Verbalizes importance of monitoring SPO2 with pulse oximeter and return demonstration;Maintenance of O2 saturations>88%;Exhibits proper breathing techniques, such as pursed lip breathing or other method taught during program session;Compliance with respiratory medication;Demonstrates proper use of MDI's    Comments  Art is doing well with oxygen at night, but he does still snore.  He is using PLB at home.  He is keeping  a close eye on his saturations and they are good for most part.    Goals/Expected Outcomes  Short: Continue to montior saturations and use PLB.  Long: Continue to manage pulmonary disease.       Initial Exercise Prescription: Initial Exercise Prescription - 10/05/19 1400      Date of Initial Exercise RX and Referring Provider   Date  10/05/19    Referring Provider  [name] MD      Treadmill   MPH  1.6    Grade  0.5    Minutes  15    METs  2.34      NuStep   Level  2    SPM  80    Minutes  15    METs  2      REL-XR   Level  1    Speed  50    Minutes  15  METs  2      T5 Nustep   Level  2    SPM  80    Minutes  15    METs  2      Prescription Details   Frequency (times per week)  3    Duration  Progress to 30 minutes of continuous aerobic without signs/symptoms of physical distress      Intensity   THRR 40-80% of Max Heartrate  117-144    Ratings of Perceived Exertion  11-13    Perceived Dyspnea  0-4      Progression   Progression  Continue to progress workloads to maintain intensity without signs/symptoms of physical distress.      Resistance Training   Training Prescription  Yes    Weight  3 lb    Reps  10-15       Perform Capillary Blood Glucose checks as needed.  Exercise Prescription Changes: Exercise Prescription Changes    Row Name 10/05/19 1400 10/21/19 1300 11/05/19 1000 11/15/19 1700 12/01/19 1000     Response to Exercise   Blood Pressure (Admit)  112/64  136/70  142/80  138/78  150/80   Blood Pressure (Exercise)  146/64  164/84  140/66  148/70  180/80   Blood Pressure (Exit)  142/70  -  128/80  130/80  156/76   Heart Rate (Admit)  90 bpm  97 bpm  98 bpm  95 bpm  90 bpm   Heart Rate (Exercise)  125 bpm  106 bpm  103 bpm  113 bpm  122 bpm   Heart Rate (Exit)  95 bpm  101 bpm  98 bpm  90 bpm  104 bpm   Oxygen Saturation (Admit)  97 %  97 %  96 %  95 %  97 %   Oxygen Saturation (Exercise)  95 %  96 %  96 %  94 %  94 %   Oxygen  Saturation (Exit)  98 %  96 %  96 %  96 %  97 %   Rating of Perceived Exertion (Exercise)  15  14  15  13  13    Perceived Dyspnea (Exercise)  3  3  3   -  3   Symptoms  hip pain 3/10, knee pain 3/10, SOB  -  SOB  -  fatigue   Comments  walk test results  -  -  -  -   Duration  -  Continue with 30 min of aerobic exercise without signs/symptoms of physical distress.  Continue with 30 min of aerobic exercise without signs/symptoms of physical distress.  Continue with 30 min of aerobic exercise without signs/symptoms of physical distress.  Continue with 30 min of aerobic exercise without signs/symptoms of physical distress.   Intensity  -  THRR unchanged  THRR unchanged  THRR unchanged  THRR unchanged     Progression   Progression  -  Continue to progress workloads to maintain intensity without signs/symptoms of physical distress.  Continue to progress workloads to maintain intensity without signs/symptoms of physical distress.  Continue to progress workloads to maintain intensity without signs/symptoms of physical distress.  Continue to progress workloads to maintain intensity without signs/symptoms of physical distress.   Average METs  -  2.2  2.46  2.2  2.2     Resistance Training   Training Prescription  -  Yes  Yes  Yes  Yes   Weight  -  3 lb  6 lb  6 lb  6 lb   Reps  -  10-15  10-15  10-15  10-15     Interval Training   Interval Training  -  -  No  No  No     Treadmill   MPH  -  1.6  1.8  -  -   Grade  -  0.5  0.5  -  -   Minutes  -  15  15  -  -   METs  -  2.34  2.5  -  -     Recumbant Bike   Level  -  -  -  8  5   RPM  -  -  -  50  -   Minutes  -  -  -  15  15   METs  -  -  -  2.2  -     NuStep   Level  -  2  -  2  4   SPM  -  80  -  80  -   Minutes  -  15  -  15  15   METs  -  2  -  2  2.2     REL-XR   Level  -  -  1  -  -   Minutes  -  -  15  -  -   METs  -  -  2.8  -  -     T5 Nustep   Level  -  -  2  -  -   Minutes  -  -  15  -  -   METs  -  -  2.1  -  -       Exercise Comments: Exercise Comments    Row Name 10/06/19 0856 10/08/19 0920 11/04/19 0900       Exercise Comments  First full day of exercise!  Patient was oriented to gym and equipment including functions, settings, policies, and procedures.  Patient's individual exercise prescription and treatment plan were reviewed.  All starting workloads were established based on the results of the 6 minute walk test done at initial orientation visit.  The plan for exercise progression was also introduced and progression will be customized based on patient's performance and goals.  First full day of exercise!  Patient was oriented to gym and equipment including functions, settings, policies, and procedures.  Patient's individual exercise prescription and treatment plan were reviewed.  All starting workloads were established based on the results of the 6 minute walk test done at initial orientation visit.  The plan for exercise progression was also introduced and progression will be customized based on patient's performance and goals.  Virtual call completed today. Calls are done every week that patient is not attending an onsite exercise session during the COVID 19 PHE Crisis.   Today's call included review of :their home exercise  Art had ED visit yesterday for raw,swollen throat. Has been prescribed a Z Pack. He is aware to finish med and be symptom free before returning to program. He is taking it easy this week as he heals.        Exercise Goals and Review: Exercise Goals    Row Name 10/05/19 1455             Exercise Goals   Increase Physical Activity  Yes       Intervention  Provide advice, education, support and counseling about  physical activity/exercise needs.;Develop an individualized exercise prescription for aerobic and resistive training based on initial evaluation findings, risk stratification, comorbidities and participant's personal goals.       Expected Outcomes  Short Term: Attend  rehab on a regular basis to increase amount of physical activity.;Long Term: Add in home exercise to make exercise part of routine and to increase amount of physical activity.;Long Term: Exercising regularly at least 3-5 days a week.       Increase Strength and Stamina  Yes       Intervention  Provide advice, education, support and counseling about physical activity/exercise needs.;Develop an individualized exercise prescription for aerobic and resistive training based on initial evaluation findings, risk stratification, comorbidities and participant's personal goals.       Expected Outcomes  Short Term: Increase workloads from initial exercise prescription for resistance, speed, and METs.;Short Term: Perform resistance training exercises routinely during rehab and add in resistance training at home;Long Term: Improve cardiorespiratory fitness, muscular endurance and strength as measured by increased METs and functional capacity (6MWT)       Able to understand and use rate of perceived exertion (RPE) scale  Yes       Intervention  Provide education and explanation on how to use RPE scale       Expected Outcomes  Long Term:  Able to use RPE to guide intensity level when exercising independently;Short Term: Able to use RPE daily in rehab to express subjective intensity level       Able to understand and use Dyspnea scale  Yes       Intervention  Provide education and explanation on how to use Dyspnea scale       Expected Outcomes  Short Term: Able to use Dyspnea scale daily in rehab to express subjective sense of shortness of breath during exertion;Long Term: Able to use Dyspnea scale to guide intensity level when exercising independently       Knowledge and understanding of Target Heart Rate Range (THRR)  Yes       Intervention  Provide education and explanation of THRR including how the numbers were predicted and where they are located for reference       Expected Outcomes  Short Term: Able to state/look  up THRR;Short Term: Able to use daily as guideline for intensity in rehab;Long Term: Able to use THRR to govern intensity when exercising independently       Able to check pulse independently  Yes       Intervention  Provide education and demonstration on how to check pulse in carotid and radial arteries.;Review the importance of being able to check your own pulse for safety during independent exercise       Expected Outcomes  Short Term: Able to explain why pulse checking is important during independent exercise;Long Term: Able to check pulse independently and accurately       Understanding of Exercise Prescription  Yes       Intervention  Provide education, explanation, and written materials on patient's individual exercise prescription       Expected Outcomes  Short Term: Able to explain program exercise prescription;Long Term: Able to explain home exercise prescription to exercise independently          Exercise Goals Re-Evaluation : Exercise Goals Re-Evaluation    Row Name 10/06/19 0857 10/08/19 0920 10/21/19 1344 11/05/19 1006 12/01/19 1049     Exercise Goal Re-Evaluation   Exercise Goals Review  Able to understand and use rate of  perceived exertion (RPE) scale;Able to understand and use Dyspnea scale;Knowledge and understanding of Target Heart Rate Range (THRR);Understanding of Exercise Prescription  Able to understand and use rate of perceived exertion (RPE) scale;Knowledge and understanding of Target Heart Rate Range (THRR);Understanding of Exercise Prescription  Able to understand and use rate of perceived exertion (RPE) scale;Able to understand and use Dyspnea scale;Knowledge and understanding of Target Heart Rate Range (THRR);Able to check pulse independently;Understanding of Exercise Prescription  Increase Physical Activity;Increase Strength and Stamina;Understanding of Exercise Prescription  Increase Physical Activity;Increase Strength and Stamina;Understanding of Exercise Prescription    Comments  Reviewed RPE and dyspnea scale, THR and program prescription with pt today.  Pt voiced understanding and was given a copy of goals to take home.  Reviewed RPE scale, THR and program prescription with pt today.  Pt voiced understanding and was given a copy of goals to take home.  Art is tolerating exercise well and will work on Catering manager on TM.  Staff will monitor progress.  Art has been doing well with rehab.  He has been holding steady at 2.1 METS on T5 NuStep and 2.8 METs on XR.  We will start to move up his workloads and continue to montior his progress.  Art has been doing well in rehab.  He reduced his workloads some yesterday as he had been out sick the previous week.  He is glad to be back to class.  We will continue to monitor his progress.   Expected Outcomes  Short: Use RPE and dyspnea scales daily to regulate intensity. Long: Follow program prescription in THR.  Short: Use RPE daily to regulate intensity. Long: Follow program prescription in THR.  Short - exercise consistently Long - increase overall stamina  Short: Increase workloads. Long: Continue to improve stamina.  Short: Return to regular attendance.  Long: Continue to improve strength and stamina.   [place] Name 12/02/19 0838             Exercise Goal Re-Evaluation   Exercise Goals Review  Increase Physical Activity;Increase Strength and Stamina;Understanding of Exercise Prescription       Comments  Art is doing well in rehab.  His exercise at home has been chasing his 4 and 47 yo grandkids.  Reviewed home exercise with pt today.  Pt plans to walking at track at school for exercise.  Reviewed THR, pulse, RPE, sign and symptoms, and when to call 911 or MD.  Also discussed weather considerations and indoor options.  Pt voiced understanding.       Expected Outcomes  Short: Walk consistently one extra day a week.  Long: Continue to improve stamina.          Discharge Exercise Prescription (Final Exercise Prescription  Changes): Exercise Prescription Changes - 12/01/19 1000      Response to Exercise   Blood Pressure (Admit)  150/80    Blood Pressure (Exercise)  180/80    Blood Pressure (Exit)  156/76    Heart Rate (Admit)  90 bpm    Heart Rate (Exercise)  122 bpm    Heart Rate (Exit)  104 bpm    Oxygen Saturation (Admit)  97 %    Oxygen Saturation (Exercise)  94 %    Oxygen Saturation (Exit)  97 %    Rating of Perceived Exertion (Exercise)  13    Perceived Dyspnea (Exercise)  3    Symptoms  fatigue    Duration  Continue with 30 min of aerobic exercise without signs/symptoms of  physical distress.    Intensity  THRR unchanged      Progression   Progression  Continue to progress workloads to maintain intensity without signs/symptoms of physical distress.    Average METs  2.2      Resistance Training   Training Prescription  Yes    Weight  6 lb    Reps  10-15      Interval Training   Interval Training  No      Recumbant Bike   Level  5    Minutes  15      NuStep   Level  4    Minutes  15    METs  2.2       Nutrition:  Target Goals: Understanding of nutrition guidelines, daily intake of sodium <1529m, cholesterol <2048m calories 30% from fat and 7% or less from saturated fats, daily to have 5 or more servings of fruits and vegetables.  Biometrics: Pre Biometrics - 10/05/19 1455      Pre Biometrics   Height  5' 11.1" (1.806 m)    Weight  244 lb 11.2 oz (111 kg)    BMI (Calculated)  34.03    Single Leg Stand  3.12 seconds        Nutrition Therapy Plan and Nutrition Goals: Nutrition Therapy & Goals - 11/18/19 1401      Nutrition Therapy   Diet  Low Na, HH, DM    Protein (specify units)  90g    Fiber  30 grams    Whole Grain Foods  3 servings    Saturated Fats  12 max. grams    Fruits and Vegetables  5 servings/day    Sodium  1.5 grams      Personal Nutrition Goals   Nutrition Goal  ST: Add vegetables into burrito LT: like to get back to shape he was in 5 years ago,  improve breathing and SOB    Comments  A1c. Pt using stevia for sweet tea instead of sugar. ~36 eggs/week. 2 scrambled egg burrito daily. May have peanut butter sandwich or some cookies. Sometimes have pasta salad with olive apple. Pt reports not salting anything. 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 Eats roasted unsalted nuts. Discussed HH eating. Suggested adding vegetables to burritos and making burritos whole wheat. Pt chose adding vegetables.      Intervention Plan   Intervention  Prescribe, educate and counsel regarding individualized specific dietary modifications aiming towards targeted core components such as weight, hypertension, lipid management, diabetes, heart failure and other comorbidities.;Nutrition handout(s) given to patient.    Expected Outcomes  Short Term Goal: Understand basic principles of dietary content, such as calories, fat, sodium, cholesterol and nutrients.;Short Term Goal: A plan has been developed with personal nutrition goals set during dietitian appointment.;Long Term Goal: Adherence to prescribed nutrition plan.       Nutrition Assessments: Nutrition Assessments - 10/05/19 1456      MEDFICTS Scores   Pre Score  77       Nutrition Goals Re-Evaluation:   Nutrition Goals Discharge (Final Nutrition Goals Re-Evaluation):   Psychosocial: Target Goals: Acknowledge presence or absence of significant depression and/or stress, maximize coping skills, provide positive support system. Participant is able to verbalize types and ability to use techniques and skills needed for reducing stress and depression.   Initial Review & Psychosocial Screening: Initial Psych Review & Screening - 10/04/19 1037      Initial  Review   Current issues with  Current Anxiety/Panic;Current Depression      Family Dynamics   Good Support System?  Yes    Comments  He can look to his wife, daughter, son and his church  for support.      Barriers   Psychosocial barriers to participate in program  The patient should benefit from training in stress management and relaxation.      Screening Interventions   Interventions  Provide feedback about the scores to participant;Encouraged to exercise;Program counselor consult;To provide support and resources with identified psychosocial needs    Expected Outcomes  Long Term Goal: Stressors or current issues are controlled or eliminated.;Short Term goal: Utilizing psychosocial counselor, staff and physician to assist with identification of specific Stressors or current issues interfering with healing process. Setting desired goal for each stressor or current issue identified.;Short Term goal: Identification and review with participant of any Quality of Life or Depression concerns found by scoring the questionnaire.;Long Term goal: The participant improves quality of Life and PHQ9 Scores as seen by post scores and/or verbalization of changes       Quality of Life Scores:   Scores of 19 and below usually indicate a poorer quality of life in these areas.  A difference of  2-3 points is a clinically meaningful difference.  A difference of 2-3 points in the total score of the Quality of Life Index has been associated with significant improvement in overall quality of life, self-image, physical symptoms, and general health in studies assessing change in quality of life.  PHQ-9: Recent Review Flowsheet Data    Depression screen [facility] 2/9 10/05/2019 02/18/2019 08/18/2018 02/25/2017 08/27/2016   Decreased Interest 1 0 1 0 3   Down, Depressed, Hopeless 3 0 0 0 3   PHQ - 2 Score 4 0 1 0 6   Altered sleeping 0 - 3 3 3    Tired, decreased energy 3 - 3 3 3    Change in appetite 0 - 0 2 1   Feeling bad or failure about yourself  0 - 0 1 0   Trouble concentrating 1 - 0 0 0   Moving slowly or fidgety/restless 3 - 0 0 0   Suicidal thoughts 0 - 0 0 0   PHQ-9 Score 11 - 7 9 13    Difficult  doing work/chores Somewhat difficult - Not difficult at all - Very difficult     Interpretation of Total Score  Total Score Depression Severity:  1-4 = Minimal depression, 5-9 = Mild depression, 10-14 = Moderate depression, 15-19 = Moderately severe depression, 20-27 = Severe depression   Psychosocial Evaluation and Intervention: Psychosocial Evaluation - 10/04/19 1043      Psychosocial Evaluation & Interventions   Interventions  Encouraged to exercise with the program and follow exercise prescription    Comments  He can look to his wife, daughter, son and his church for support. He states he has some depression and gets short of breath easily. He gets short of breath easily and wants to be able to do more without getting so short of breath.    Expected Outcomes  Short: attend [facility] to decrease stress and improve SOB. Long: Exercise independently to keep stress at a minimum.    Continue Psychosocial Services   Follow up required by staff       Psychosocial Re-Evaluation: Psychosocial Re-Evaluation    [place] Name 11/11/19 [phone] 12/02/19 1859           Psychosocial Re-Evaluation  Current issues with  Current Stress Concerns;Current Sleep Concerns  Current Stress Concerns;Current Sleep Concerns;Current Psychotropic Meds;Current Anxiety/Panic      Comments  Art states his meds take care of any stress/anxiety.  He sleeps well - has a Purple mattress and pillow.  Art is doing well overall.  His meds keep him well mangaed and denies current anxiety symptoms.  He gets tired after meals and naps.  He has been helping to care for his grandkids during the week which keeps him busy.      Expected Outcomes  Short - continue to take meds as directed Long : maintain good sleep patterns  Short - continue to take meds as directed Long : maintain good sleep patterns      Interventions  -  Encouraged to attend Pulmonary Rehabilitation for the exercise      Continue Psychosocial Services   -  Follow up  required by staff         Psychosocial Discharge (Final Psychosocial Re-Evaluation): Psychosocial Re-Evaluation - 12/02/19 0960      Psychosocial Re-Evaluation   Current issues with  Current Stress Concerns;Current Sleep Concerns;Current Psychotropic Meds;Current Anxiety/Panic    Comments  Art is doing well overall.  His meds keep him well mangaed and denies current anxiety symptoms.  He gets tired after meals and naps.  He has been helping to care for his grandkids during the week which keeps him busy.    Expected Outcomes  Short - continue to take meds as directed Long : maintain good sleep patterns    Interventions  Encouraged to attend Pulmonary Rehabilitation for the exercise    Continue Psychosocial Services   Follow up required by staff       Vocational Rehabilitation: Provide vocational rehab assistance to qualifying candidates.   Vocational Rehab Evaluation & Intervention:   Education: Education Goals: Education classes will be provided on a variety of topics geared toward better understanding of heart health and risk factor modification. Participant will state understanding/return demonstration of topics presented as noted by education test scores.  Learning Barriers/Preferences: Learning Barriers/Preferences - 10/04/19 1039      Learning Barriers/Preferences   Learning Barriers  None    Learning Preferences  None       Education Topics:  AED/CPR: - Group verbal and written instruction with the use of models to demonstrate the basic use of the AED with the basic ABC's of resuscitation.   General Nutrition Guidelines/Fats and Fiber: -Group instruction provided by verbal, written material, models and posters to present the general guidelines for heart healthy nutrition. Gives an explanation and review of dietary fats and fiber.   Controlling Sodium/Reading Food Labels: -Group verbal and written material supporting the discussion of sodium use in heart healthy  nutrition. Review and explanation with models, verbal and written materials for utilization of the food label.   Exercise Physiology & General Exercise Guidelines: - Group verbal and written instruction with models to review the exercise physiology of the cardiovascular system and associated critical values. Provides general exercise guidelines with specific guidelines to those with heart or lung disease.    Aerobic Exercise & Resistance Training: - Gives group verbal and written instruction on the various components of exercise. Focuses on aerobic and resistive training programs and the benefits of this training and how to safely progress through these programs..   Flexibility, Balance, Mind/Body Relaxation: Provides group verbal/written instruction on the benefits of flexibility and balance training, including mind/body exercise modes such as yoga,  pilates and tai chi.  Demonstration and skill practice provided.   Stress and Anxiety: - Provides group verbal and written instruction about the health risks of elevated stress and causes of high stress.  Discuss the correlation between heart/lung disease and anxiety and treatment options. Review healthy ways to manage with stress and anxiety.   Depression: - Provides group verbal and written instruction on the correlation between heart/lung disease and depressed mood, treatment options, and the stigmas associated with seeking treatment.   Anatomy & Physiology of the Heart: - Group verbal and written instruction and models provide basic cardiac anatomy and physiology, with the coronary electrical and arterial systems. Review of Valvular disease and Heart Failure   Cardiac Procedures: - Group verbal and written instruction to review commonly prescribed medications for heart disease. Reviews the medication, class of the drug, and side effects. Includes the steps to properly store meds and maintain the prescription regimen. (beta blockers and  nitrates)   Cardiac Medications I: - Group verbal and written instruction to review commonly prescribed medications for heart disease. Reviews the medication, class of the drug, and side effects. Includes the steps to properly store meds and maintain the prescription regimen.   Cardiac Medications II: -Group verbal and written instruction to review commonly prescribed medications for heart disease. Reviews the medication, class of the drug, and side effects. (all other drug classes)    Go Sex-Intimacy & Heart Disease, Get SMART - Goal Setting: - Group verbal and written instruction through game format to discuss heart disease and the return to sexual intimacy. Provides group verbal and written material to discuss and apply goal setting through the application of the S.M.A.R.T. Method.   Other Matters of the Heart: - Provides group verbal, written materials and models to describe Stable Angina and Peripheral Artery. Includes description of the disease process and treatment options available to the cardiac patient.   Exercise & Equipment Safety: - Individual verbal instruction and demonstration of equipment use and safety with use of the equipment.   Pulmonary Rehab from 10/05/2019 in [facility] Cardiac and Pulmonary Rehab  Date  10/05/19  Educator  [facility]  Instruction Review Code  1- Verbalizes Understanding      Infection Prevention: - Provides verbal and written material to individual with discussion of infection control including proper hand washing and proper equipment cleaning during exercise session.   Pulmonary Rehab from 10/05/2019 in [facility] Cardiac and Pulmonary Rehab  Date  10/05/19  Educator  [facility]  Instruction Review Code  1- Verbalizes Understanding      Falls Prevention: - Provides verbal and written material to individual with discussion of falls prevention and safety.   Pulmonary Rehab from 10/05/2019 in [facility] Cardiac and Pulmonary Rehab  Date  10/04/19  Educator  [facility]   Instruction Review Code  1- Verbalizes Understanding      Diabetes: - Individual verbal and written instruction to review signs/symptoms of diabetes, desired ranges of glucose level fasting, after meals and with exercise. Acknowledge that pre and post exercise glucose checks will be done for 3 sessions at entry of program.   Know Your Numbers and Risk Factors: -Group verbal and written instruction about important numbers in your health.  Discussion of what are risk factors and how they play a role in the disease process.  Review of Cholesterol, Blood Pressure, Diabetes, and BMI and the role they play in your overall health.   Sleep Hygiene: -Provides group verbal and written instruction about how sleep can affect your  health.  Define sleep hygiene, discuss sleep cycles and impact of sleep habits. Review good sleep hygiene tips.    Other: -Provides group and verbal instruction on various topics (see comments)   Knowledge Questionnaire Score: Knowledge Questionnaire Score - 10/05/19 1458      Knowledge Questionnaire Score   Pre Score  16/18 education focus: O2 safety, ADLs       Core Components/Risk Factors/Patient Goals at Admission: Personal Goals and Risk Factors at Admission - 10/05/19 1459      Core Components/Risk Factors/Patient Goals on Admission    Weight Management  Yes;Weight Loss    Intervention  Weight Management: Develop a combined nutrition and exercise program designed to reach desired caloric intake, while maintaining appropriate intake of nutrient and fiber, sodium and fats, and appropriate energy expenditure required for the weight goal.;Weight Management: Provide education and appropriate resources to help participant work on and attain dietary goals.;Weight Management/Obesity: Establish reasonable short term and long term weight goals.;Obesity: Provide education and appropriate resources to help participant work on and attain dietary goals.    Admit Weight  244  lb 11.2 oz (111 kg)    Goal Weight: Short Term  235 lb (106.6 kg)    Goal Weight: Long Term  230 lb (104.3 kg)    Expected Outcomes  Short Term: Continue to assess and modify interventions until short term weight is achieved;Weight Maintenance: Understanding of the daily nutrition guidelines, which includes 25-35% calories from fat, 7% or less cal from saturated fats, less than 267m cholesterol, less than 1.5gm of sodium, & 5 or more servings of fruits and vegetables daily;Weight Loss: Understanding of general recommendations for a balanced deficit meal plan, which promotes 1-2 lb weight loss per week and includes a negative energy balance of [phone] kcal/d;Understanding recommendations for meals to include 15-35% energy as protein, 25-35% energy from fat, 35-60% energy from carbohydrates, less than 202mof dietary cholesterol, 20-35 gm of total fiber daily;Understanding of distribution of calorie intake throughout the day with the consumption of 4-5 meals/snacks;Long Term: Adherence to nutrition and physical activity/exercise program aimed toward attainment of established weight goal    Tobacco Cessation  Yes    Intervention  Assist the participant in steps to quit. Provide individualized education and counseling about committing to Tobacco Cessation, relapse prevention, and pharmacological support that can be provided by physician.;OfAdvice workerassist with locating and accessing local/national Quit Smoking programs, and support quit date choice.    Expected Outcomes  Short Term: Will demonstrate readiness to quit, by selecting a quit date.;Long Term: Complete abstinence from all tobacco products for at least 12 months from quit date.;Short Term: Will quit all tobacco product use, adhering to prevention of relapse plan.    Improve shortness of breath with ADL's  Yes    Intervention  Provide education, individualized exercise plan and daily activity instruction to help decrease symptoms  of SOB with activities of daily living.    Expected Outcomes  Short Term: Improve cardiorespiratory fitness to achieve a reduction of symptoms when performing ADLs;Long Term: Be able to perform more ADLs without symptoms or delay the onset of symptoms    Hypertension  Yes    Intervention  Provide education on lifestyle modifcations including regular physical activity/exercise, weight management, moderate sodium restriction and increased consumption of fresh fruit, vegetables, and low fat dairy, alcohol moderation, and smoking cessation.;Monitor prescription use compliance.    Expected Outcomes  Short Term: Continued assessment and intervention until BP is < 140/9068mG  in hypertensive participants. < 130/54m HG in hypertensive participants with diabetes, heart failure or chronic kidney disease.;Long Term: Maintenance of blood pressure at goal levels.    Lipids  Yes    Intervention  Provide education and support for participant on nutrition & aerobic/resistive exercise along with prescribed medications to achieve LDL <765m HDL >4054m   Expected Outcomes  Short Term: Participant states understanding of desired cholesterol values and is compliant with medications prescribed. Participant is following exercise prescription and nutrition guidelines.;Long Term: Cholesterol controlled with medications as prescribed, with individualized exercise RX and with personalized nutrition plan. Value goals: LDL < 62m3mDL > 40 mg.       Core Components/Risk Factors/Patient Goals Review:  Goals and Risk Factor Review    Row Name 11/11/19 0817 12/02/19 0840           Core Components/Risk Factors/Patient Goals Review   Personal Goals Review  Weight Management/Obesity;Improve shortness of breath with ADL's;Hypertension;Lipids  Weight Management/Obesity;Improve shortness of breath with ADL's;Hypertension;Lipids      Review  Art hasnt been checking BP at home.  [facility]  He does have a  monitor at home.  He will start checking at home at least once a day.  His weight is holding at 247.  He does stretch when he gets up.  He can play with his grandkids for short periods.  He would like to be able to play more with them.  Art has an appointment with Dr. AridFletcher Anont week for a follow up.  He should get a good report.  His weight is creeping up and he hopes its muscle as he is feeling stronger overall.  He only eats twice a day as he doesn't move as much.  His pressures have been good and breathing is improving.  He has found that he can now go up the stairs!      Expected Outcomes  Short :  [facility] [place] [employer] [facility] [place] Long :  walk up and down stairs without getting short of breath  Short: Continue to work on weight loss.  Long: Continue to exericse for weight loss and SOB.         Core Components/Risk Factors/Patient Goals at Discharge (Final Review):  Goals and Risk Factor Review - 12/02/19 0840      Core Components/Risk Factors/Patient Goals Review   Personal Goals Review  Weight Management/Obesity;Improve shortness of breath with ADL's;Hypertension;Lipids    Review  Art has an appointment with Dr. AridFletcher Anont week for a follow up.  He should get a good report.  His weight is creeping up and he hopes its muscle as he is feeling stronger overall.  He only eats twice a day as he doesn't move as much.  His pressures have been good and breathing is improving.  He has found that he can now go up the stairs!    Expected Outcomes  Short: Continue to work on weight loss.  Long: Continue to exericse for weight loss and SOB.       ITP Comments: ITP Comments    Row Name 10/05/19 1232 10/06/19 0856 10/08/19 0919 10/20/19 0912 11/04/19 0900   ITP Comments  Completed 6MWT and gym orientation.  Initial ITP created and sent for review to Dr. MarkEmily Filbertdical Director.  First full day of exercise!  Patient was oriented to gym and equipment including functions, settings,  policies, and procedures.  Patient's individual  exercise prescription and treatment plan were reviewed.  All starting workloads were established based on the results of the 6 minute walk test done at initial orientation visit.  The plan for exercise progression was also introduced and progression will be customized based on patient's performance and goals.  First full day of exercise!  Patient was oriented to gym and equipment including functions, settings, policies, and procedures.  Patient's individual exercise prescription and treatment plan were reviewed.  All starting workloads were established based on the results of the 6 minute walk test done at initial orientation visit.  The plan for exercise progression was also introduced and progression will be customized based on patient's performance and goals.  30 day review competed . ITP sent to Dr Emily Filbert for review, changes as needed and ITP approval signature  New tp program  Virtual call completed today. Calls are done every week that patient is not attending an onsite exercise session during the COVID 19 PHE Crisis.   Today's call included review of :their home exercise  Art had ED visit yesterday for raw,swollen throat. Has been prescribed a Z Pack. He is aware to finish med and be symptom free before returning to program. He is taking it easy this week as he heals.   [place] Name 11/04/19 0910 11/17/19 1453 11/18/19 1425       ITP Comments  Pt reports not being able to complete RD appointment due to recent ER visit and visiting grandchildren. Rescheduled RD evaluation with pt.  30 day review completed. ITP sent to Dr. Emily Filbert, Medical Director of Cardiac and Pulmonary Rehab. Continue with ITP unless changes are made by physician.  Department operating under reduced schedule until further notice by request from hospital leadership.  Completed Initial RD Eval        Comments:

## 2019-12-15 ENCOUNTER — Encounter: Payer: Self-pay | Admitting: *Deleted

## 2019-12-15 DIAGNOSIS — J4550 Severe persistent asthma, uncomplicated: Secondary | ICD-10-CM

## 2019-12-15 NOTE — Progress Notes (Signed)
Pulmonary Individual Treatment Plan  Patient Details  Name: Cody Day MRN: [id number] Date of Birth: [birth year] Referring Provider:     Pulmonary Rehab from 10/05/2019 in [facility]  Referring Provider  [name] MD      Initial Encounter Date:    Pulmonary Rehab from 10/05/2019 in [facility]monary Rehab  Date  10/05/19      Visit Diagnosis: Severe persistent chronic asthma without complication  Patient's Home Medications on Admission:  Current Outpatient Medications:  .  albuterol (VENTOLIN HFA) 108 (90 Base) MCG/ACT inhaler, Inhale 2 puffs into the lungs every 4 (four) hours as needed for wheezing or shortness of breath., Disp: 1 Inhaler, Rfl: 10 .  allopurinol (ZYLOPRIM) 300 MG tablet, TAKE 1 TABLET BY MOUTH DAILY, Disp: 90 tablet, Rfl: 1 .  citalopram (CELEXA) 40 MG tablet, Take 1 tablet (40 mg total) by mouth daily., Disp: 90 tablet, Rfl: 1 .  fluticasone (FLOVENT HFA) 220 MCG/ACT inhaler, Inhale 2 puffs into the lungs 2 (two) times daily., Disp: 1 Inhaler, Rfl: 5 .  lisinopril (ZESTRIL) 20 MG tablet, TAKE 1 TABLET BY MOUTH DAILY, Disp: 90 tablet, Rfl: 1 .  montelukast (SINGULAIR) 10 MG tablet, TAKE 1 TABLET BY MOUTH DAILY, Disp: 90 tablet, Rfl: 1 .  Multiple Vitamin (MULTIVITAMIN WITH MINERALS) TABS tablet, Take 1 tablet by mouth daily., Disp: 30 tablet, Rfl: 2 .  simvastatin (ZOCOR) 20 MG tablet, TAKE 1 TABLET BY MOUTH DAILY, Disp: 90 tablet, Rfl: 3 .  STIOLTO RESPIMAT 2.5-2.5 MCG/ACT AERS, USE 2 INHALATIONS EVERY DAY, Disp: 4 g, Rfl: 5 .  testosterone cypionate (DEPOTESTOSTERONE CYPIONATE) 200 MG/ML injection, Inject 0.3 mLs (60 mg total) into the muscle once a week., Disp: 6 mL, Rfl: 0  Past Medical History: Past Medical History:  Diagnosis Date  . Achilles tendon rupture 10/11   and repair  . Adenomatous polyp of colon 04/06/2010   Adenomatous polyps 3 in the descending colon, times one in the ascending colon [facility])  .  Asthma   . COPD (chronic obstructive pulmonary disease) ([place])   . Gout   . Hemorrhoids   . Hypercholesteremia   . Hypertension   . Sleep apnea    doesn't wear CPAP  . Syncope 10/11   in settin gof asthma exacerbation w coughing. Ech (10/11): EF > 55%, mild LVH, grade I diastolic dysfunction, nomral RV size and systolic function,. normal valves. Carotid US (10/11): minimal disease    Tobacco Use: Social History   Tobacco Use  Smoking Status Former Smoker  . Packs/day: 0.25  . Years: 35.00  . Pack years: 8.75  . Types: Cigarettes  . Quit date: 10/22/1995  . Years since quitting: 24.1  Smokeless Tobacco Current User  . Types: Chew  Tobacco Comment   rarely smoked cigarettes and cigars.  states he smoked drugs mostly    Labs: Recent Review Flowsheet Data    Labs for ITP Cardiac and Pulmonary Rehab Latest Ref Rng & Units 08/23/2016 04/01/2018 07/20/2018 02/15/2019 08/30/2019   Cholestrol 0 - 200 mg/dL 174 168 - 136 123   LDLCALC 0 - 99 mg/dL - 75 - 71 57   LDLDIRECT mg/dL 53.0 - - - -   HDL >39.00 mg/dL 97.10 81.60 - 44.50 51.00   Trlycerides 0.0 - 149.0 mg/dL 257.0(H) 58.0 - 103.0 76.0   Hemoglobin A1c 4.6 - 6.5 % 6.2 6.1 5.7(H) 6.8(H) 6.3   TCO2 0 - 100 mmol/L - - - - -  Pulmonary Assessment Scores: Pulmonary Assessment Scores    Row Name 10/05/19 1501         ADL UCSD   ADL Phase  Entry     SOB Score total  79     Rest  1     Walk  3     Stairs  4     Bath  5     Dress  4     Shop  4       CAT Score   CAT Score  25       mMRC Score   mMRC Score  4        UCSD: Self-administered rating of dyspnea associated with activities of daily living (ADLs) 6-point scale (0 = "not at all" to 5 = "maximal or unable to do because of breathlessness")  Scoring Scores range from 0 to 120.  Minimally important difference is 5 units  CAT: CAT can identify the health impairment of COPD patients and is better correlated with disease progression.  CAT has a scoring range  of zero to 40. The CAT score is classified into four groups of low (less than 10), medium (10 - 20), high (21-30) and very high (31-40) based on the impact level of disease on health status. A CAT score over 10 suggests significant symptoms.  A worsening CAT score could be explained by an exacerbation, poor medication adherence, poor inhaler technique, or progression of COPD or comorbid conditions.  CAT MCID is 2 points  mMRC: mMRC (Modified Medical Research Council) Dyspnea Scale is used to assess the degree of baseline functional disability in patients of respiratory disease due to dyspnea. No minimal important difference is established. A decrease in score of 1 point or greater is considered a positive change.   Pulmonary Function Assessment:   Exercise Target Goals: Exercise Program Goal: Individual exercise prescription set using results from initial 6 min walk test and THRR while considering  patient's activity barriers and safety.   Exercise Prescription Goal: Initial exercise prescription builds to 30-45 minutes a day of aerobic activity, 2-3 days per week.  Home exercise guidelines will be given to patient during program as part of exercise prescription that the participant will acknowledge.  Activity Barriers & Risk Stratification: Activity Barriers & Cardiac Risk Stratification - 10/05/19 1451      Activity Barriers & Cardiac Risk Stratification   Activity Barriers  Deconditioning;Muscular Weakness;Other (comment);Shortness of Breath;Balance Concerns    Comments  shuffles feet, tremors       6 Minute Walk: 6 Minute Walk    Row Name 10/05/19 1441         6 Minute Walk   Phase  Initial     Distance  994 feet     Walk Time  6 minutes     # of Rest Breaks  0     MPH  1.88     METS  2.82     RPE  15     Perceived Dyspnea   3     VO2 Peak  9.88     Symptoms  Yes (comment)     Comments  hip pain 3/10, SOB, knee pain 3/10, SOB     Resting HR  90 bpm     Resting BP   112/64     Resting Oxygen Saturation   97 %     Exercise Oxygen Saturation  during 6 min walk  95 %     Max  Ex. HR  125 bpm     Max Ex. BP  146/64     2 Minute Post BP  134/70       Interval HR   1 Minute HR  99     2 Minute HR  119     3 Minute HR  114     4 Minute HR  102     5 Minute HR  125     6 Minute HR  98     2 Minute Post HR  92     Interval Heart Rate?  Yes       Interval Oxygen   Interval Oxygen?  Yes     Baseline Oxygen Saturation %  97 %     1 Minute Oxygen Saturation %  96 %     1 Minute Liters of Oxygen  0 L Room Air     2 Minute Oxygen Saturation %  95 %     2 Minute Liters of Oxygen  0 L     3 Minute Oxygen Saturation %  96 %     3 Minute Liters of Oxygen  0 L     4 Minute Oxygen Saturation %  95 %     4 Minute Liters of Oxygen  0 L     5 Minute Oxygen Saturation %  95 %     5 Minute Liters of Oxygen  0 L     6 Minute Oxygen Saturation %  96 %     6 Minute Liters of Oxygen  0 L     2 Minute Post Oxygen Saturation %  97 %     2 Minute Post Liters of Oxygen  0 L       Oxygen Initial Assessment: Oxygen Initial Assessment - 10/04/19 1036      Home Oxygen   Home Oxygen Device  Home Concentrator    Sleep Oxygen Prescription  Continuous    Liters per minute  2    Home Exercise Oxygen Prescription  None    Home at Rest Exercise Oxygen Prescription  None    Compliance with Home Oxygen Use  Yes      Initial 6 min Walk   Oxygen Used  None      Program Oxygen Prescription   Program Oxygen Prescription  None      Intervention   Short Term Goals  To learn and exhibit compliance with exercise, home and travel O2 prescription;To learn and understand importance of maintaining oxygen saturations>88%;To learn and demonstrate proper use of respiratory medications;To learn and demonstrate proper pursed lip breathing techniques or other breathing techniques.;To learn and understand importance of monitoring SPO2 with pulse oximeter and demonstrate accurate use of the  pulse oximeter.    Long  Term Goals  Verbalizes importance of monitoring SPO2 with pulse oximeter and return demonstration;Exhibits proper breathing techniques, such as pursed lip breathing or other method taught during program session;Demonstrates proper use of MDI's;Exhibits compliance with exercise, home and travel O2 prescription;Maintenance of O2 saturations>88%;Compliance with respiratory medication       Oxygen Re-Evaluation: Oxygen Re-Evaluation    Row Name 10/06/19 0858 11/11/19 0844 12/02/19 0842         Program Oxygen Prescription   Program Oxygen Prescription  None  None  None       Home Oxygen   Home Oxygen Device  Home Concentrator  Home Concentrator  Home Concentrator     Sleep Oxygen  Prescription  Continuous  Continuous  Continuous     Liters per minute  2  2  2      Home Exercise Oxygen Prescription  None  None  None     Home at Rest Exercise Oxygen Prescription  None  None  None     Compliance with Home Oxygen Use  Yes  Yes  Yes       Goals/Expected Outcomes   Short Term Goals  To learn and understand importance of monitoring SPO2 with pulse oximeter and demonstrate accurate use of the pulse oximeter.;To learn and understand importance of maintaining oxygen saturations>88%;To learn and demonstrate proper pursed lip breathing techniques or other breathing techniques.  To learn and exhibit compliance with exercise, home and travel O2 prescription;To learn and understand importance of monitoring SPO2 with pulse oximeter and demonstrate accurate use of the pulse oximeter.;To learn and understand importance of maintaining oxygen saturations>88%;To learn and demonstrate proper pursed lip breathing techniques or other breathing techniques.;To learn and demonstrate proper use of respiratory medications  To learn and exhibit compliance with exercise, home and travel O2 prescription;To learn and understand importance of monitoring SPO2 with pulse oximeter and demonstrate accurate use  of the pulse oximeter.;To learn and understand importance of maintaining oxygen saturations>88%;To learn and demonstrate proper pursed lip breathing techniques or other breathing techniques.;To learn and demonstrate proper use of respiratory medications     Long  Term Goals  Verbalizes importance of monitoring SPO2 with pulse oximeter and return demonstration;Maintenance of O2 saturations>88%;Exhibits proper breathing techniques, such as pursed lip breathing or other method taught during program session  Exhibits compliance with exercise, home and travel O2 prescription;Verbalizes importance of monitoring SPO2 with pulse oximeter and return demonstration;Maintenance of O2 saturations>88%;Exhibits proper breathing techniques, such as pursed lip breathing or other method taught during program session;Compliance with respiratory medication;Demonstrates proper use of MDI's  Exhibits compliance with exercise, home and travel O2 prescription;Verbalizes importance of monitoring SPO2 with pulse oximeter and return demonstration;Maintenance of O2 saturations>88%;Exhibits proper breathing techniques, such as pursed lip breathing or other method taught during program session;Compliance with respiratory medication;Demonstrates proper use of MDI's     Comments  Reviewed PLB technique with pt.  Talked about how it works and it's importance in maintaining their exercise saturations.  Reviewed PLB - pt is using inhalers as directed.  Pateient doesnt currently have oximeter at home.  He will order one and check O2 at home  Cody Day is doing well with oxygen at night, but he does still snore.  He is using PLB at home.  He is keeping a close eye on his saturations and they are good for most part.     Goals/Expected Outcomes  Short: Become more profiecient at using PLB.   Long: Become independent at using PLB.  Short :  continue to practice PLB Long ; become independent using PLB  Short: Continue to montior saturations and use PLB.   Long: Continue to manage pulmonary disease.        Oxygen Discharge (Final Oxygen Re-Evaluation): Oxygen Re-Evaluation - 12/02/19 0842      Program Oxygen Prescription   Program Oxygen Prescription  None      Home Oxygen   Home Oxygen Device  Home Concentrator    Sleep Oxygen Prescription  Continuous    Liters per minute  2    Home Exercise Oxygen Prescription  None    Home at Rest Exercise Oxygen Prescription  None    Compliance with Home Oxygen Use  Yes      Goals/Expected Outcomes   Short Term Goals  To learn and exhibit compliance with exercise, home and travel O2 prescription;To learn and understand importance of monitoring SPO2 with pulse oximeter and demonstrate accurate use of the pulse oximeter.;To learn and understand importance of maintaining oxygen saturations>88%;To learn and demonstrate proper pursed lip breathing techniques or other breathing techniques.;To learn and demonstrate proper use of respiratory medications    Long  Term Goals  Exhibits compliance with exercise, home and travel O2 prescription;Verbalizes importance of monitoring SPO2 with pulse oximeter and return demonstration;Maintenance of O2 saturations>88%;Exhibits proper breathing techniques, such as pursed lip breathing or other method taught during program session;Compliance with respiratory medication;Demonstrates proper use of MDI's    Comments  Cody Day is doing well with oxygen at night, but he does still snore.  He is using PLB at home.  He is keeping a close eye on his saturations and they are good for most part.    Goals/Expected Outcomes  Short: Continue to montior saturations and use PLB.  Long: Continue to manage pulmonary disease.       Initial Exercise Prescription: Initial Exercise Prescription - 10/05/19 1400      Date of Initial Exercise RX and Referring Provider   Date  10/05/19    Referring Provider  [name] MD      Treadmill   MPH  1.6    Grade  0.5    Minutes  15    METs  2.34       NuStep   Level  2    SPM  80    Minutes  15    METs  2      REL-XR   Level  1    Speed  50    Minutes  15    METs  2      T5 Nustep   Level  2    SPM  80    Minutes  15    METs  2      Prescription Details   Frequency (times per week)  3    Duration  Progress to 30 minutes of continuous aerobic without signs/symptoms of physical distress      Intensity   THRR 40-80% of Max Heartrate  117-144    Ratings of Perceived Exertion  11-13    Perceived Dyspnea  0-4      Progression   Progression  Continue to progress workloads to maintain intensity without signs/symptoms of physical distress.      Resistance Training   Training Prescription  Yes    Weight  3 lb    Reps  10-15       Perform Capillary Blood Glucose checks as needed.  Exercise Prescription Changes: Exercise Prescription Changes    Row Name 10/05/19 1400 10/21/19 1300 11/05/19 1000 11/15/19 1700 12/01/19 1000     Response to Exercise   Blood Pressure (Admit)  112/64  136/70  142/80  138/78  150/80   Blood Pressure (Exercise)  146/64  164/84  140/66  148/70  180/80   Blood Pressure (Exit)  142/70  --  128/80  130/80  156/76   Heart Rate (Admit)  90 bpm  97 bpm  98 bpm  95 bpm  90 bpm   Heart Rate (Exercise)  125 bpm  106 bpm  103 bpm  113 bpm  122 bpm   Heart Rate (Exit)  95 bpm  101 bpm  98 bpm  90 bpm  104 bpm   Oxygen Saturation (Admit)  97 %  97 %  96 %  95 %  97 %   Oxygen Saturation (Exercise)  95 %  96 %  96 %  94 %  94 %   Oxygen Saturation (Exit)  98 %  96 %  96 %  96 %  97 %   Rating of Perceived Exertion (Exercise)  15  14  15  13  13    Perceived Dyspnea (Exercise)  3  3  3   --  3   Symptoms  hip pain 3/10, knee pain 3/10, SOB  --  SOB  --  fatigue   Comments  walk test results  --  --  --  --   Duration  --  Continue with 30 min of aerobic exercise without signs/symptoms of physical distress.  Continue with 30 min of aerobic exercise without signs/symptoms of physical distress.  Continue  with 30 min of aerobic exercise without signs/symptoms of physical distress.  Continue with 30 min of aerobic exercise without signs/symptoms of physical distress.   Intensity  --  THRR unchanged  THRR unchanged  THRR unchanged  THRR unchanged     Progression   Progression  --  Continue to progress workloads to maintain intensity without signs/symptoms of physical distress.  Continue to progress workloads to maintain intensity without signs/symptoms of physical distress.  Continue to progress workloads to maintain intensity without signs/symptoms of physical distress.  Continue to progress workloads to maintain intensity without signs/symptoms of physical distress.   Average METs  --  2.2  2.46  2.2  2.2     Resistance Training   Training Prescription  --  Yes  Yes  Yes  Yes   Weight  --  3 lb  6 lb  6 lb  6 lb   Reps  --  10-15  10-15  10-15  10-15     Interval Training   Interval Training  --  --  No  No  No     Treadmill   MPH  --  1.6  1.8  --  --   Grade  --  0.5  0.5  --  --   Minutes  --  15  15  --  --   METs  --  2.34  2.5  --  --     Recumbant Bike   Level  --  --  --  8  5   RPM  --  --  --  50  --   Minutes  --  --  --  15  15   METs  --  --  --  2.2  --     NuStep   Level  --  2  --  2  4   SPM  --  80  --  80  --   Minutes  --  15  --  15  15   METs  --  2  --  2  2.2     REL-XR   Level  --  --  1  --  --   Minutes  --  --  15  --  --   METs  --  --  2.8  --  --     T5 Nustep   Level  --  --  2  --  --   Minutes  --  --  15  --  --  METs  --  --  2.1  --  --   Row Name 12/14/19 1400             Response to Exercise   Blood Pressure (Admit)  138/76       Blood Pressure (Exercise)  184/88       Blood Pressure (Exit)  138/80       Heart Rate (Admit)  90 bpm       Heart Rate (Exercise)  113 bpm       Heart Rate (Exit)  105 bpm       Oxygen Saturation (Admit)  96 %       Oxygen Saturation (Exercise)  97 %       Oxygen Saturation (Exit)  97 %        Rating of Perceived Exertion (Exercise)  13       Perceived Dyspnea (Exercise)  3       Duration  Continue with 30 min of aerobic exercise without signs/symptoms of physical distress.       Intensity  THRR unchanged         Progression   Progression  Continue to progress workloads to maintain intensity without signs/symptoms of physical distress.       Average METs  3         Resistance Training   Training Prescription  Yes       Weight  6 lb       Reps  10-15         Recumbant Bike   Level  8       RPM  50       Watts  48       Minutes  15       METs  3.3         NuStep   Level  5       SPM  80       Minutes  15       METs  2.69          Exercise Comments: Exercise Comments    Row Name 10/06/19 0856 10/08/19 0920 11/04/19 0900       Exercise Comments  First full day of exercise!  Patient was oriented to gym and equipment including functions, settings, policies, and procedures.  Patient's individual exercise prescription and treatment plan were reviewed.  All starting workloads were established based on the results of the 6 minute walk test done at initial orientation visit.  The plan for exercise progression was also introduced and progression will be customized based on patient's performance and goals.  First full day of exercise!  Patient was oriented to gym and equipment including functions, settings, policies, and procedures.  Patient's individual exercise prescription and treatment plan were reviewed.  All starting workloads were established based on the results of the 6 minute walk test done at initial orientation visit.  The plan for exercise progression was also introduced and progression will be customized based on patient's performance and goals.  Virtual call completed today. Calls are done every week that patient is not attending an onsite exercise session during the COVID 19 PHE Crisis.   Today's call included review of :their home exercise  Cody Day had ED visit yesterday  for raw,swollen throat. Has been prescribed a Z Pack. He is aware to finish med and be symptom free before returning to program. He is taking it easy this week as he heals.  Exercise Goals and Review: Exercise Goals    Row Name 10/05/19 1455             Exercise Goals   Increase Physical Activity  Yes       Intervention  Provide advice, education, support and counseling about physical activity/exercise needs.;Develop an individualized exercise prescription for aerobic and resistive training based on initial evaluation findings, risk stratification, comorbidities and participant's personal goals.       Expected Outcomes  Short Term: Attend rehab on a regular basis to increase amount of physical activity.;Long Term: Add in home exercise to make exercise part of routine and to increase amount of physical activity.;Long Term: Exercising regularly at least 3-5 days a week.       Increase Strength and Stamina  Yes       Intervention  Provide advice, education, support and counseling about physical activity/exercise needs.;Develop an individualized exercise prescription for aerobic and resistive training based on initial evaluation findings, risk stratification, comorbidities and participant's personal goals.       Expected Outcomes  Short Term: Increase workloads from initial exercise prescription for resistance, speed, and METs.;Short Term: Perform resistance training exercises routinely during rehab and add in resistance training at home;Long Term: Improve cardiorespiratory fitness, muscular endurance and strength as measured by increased METs and functional capacity (6MWT)       Able to understand and use rate of perceived exertion (RPE) scale  Yes       Intervention  Provide education and explanation on how to use RPE scale       Expected Outcomes  Long Term:  Able to use RPE to guide intensity level when exercising independently;Short Term: Able to use RPE daily in rehab to express  subjective intensity level       Able to understand and use Dyspnea scale  Yes       Intervention  Provide education and explanation on how to use Dyspnea scale       Expected Outcomes  Short Term: Able to use Dyspnea scale daily in rehab to express subjective sense of shortness of breath during exertion;Long Term: Able to use Dyspnea scale to guide intensity level when exercising independently       Knowledge and understanding of Target Heart Rate Range (THRR)  Yes       Intervention  Provide education and explanation of THRR including how the numbers were predicted and where they are located for reference       Expected Outcomes  Short Term: Able to state/look up THRR;Short Term: Able to use daily as guideline for intensity in rehab;Long Term: Able to use THRR to govern intensity when exercising independently       Able to check pulse independently  Yes       Intervention  Provide education and demonstration on how to check pulse in carotid and radial arteries.;Review the importance of being able to check your own pulse for safety during independent exercise       Expected Outcomes  Short Term: Able to explain why pulse checking is important during independent exercise;Long Term: Able to check pulse independently and accurately       Understanding of Exercise Prescription  Yes       Intervention  Provide education, explanation, and written materials on patient's individual exercise prescription       Expected Outcomes  Short Term: Able to explain program exercise prescription;Long Term: Able to explain home exercise prescription to exercise independently  Exercise Goals Re-Evaluation : Exercise Goals Re-Evaluation    Row Name 10/06/19 0857 10/08/19 0920 10/21/19 1344 11/05/19 1006 12/01/19 1049     Exercise Goal Re-Evaluation   Exercise Goals Review  Able to understand and use rate of perceived exertion (RPE) scale;Able to understand and use Dyspnea scale;Knowledge and understanding of  Target Heart Rate Range (THRR);Understanding of Exercise Prescription  Able to understand and use rate of perceived exertion (RPE) scale;Knowledge and understanding of Target Heart Rate Range (THRR);Understanding of Exercise Prescription  Able to understand and use rate of perceived exertion (RPE) scale;Able to understand and use Dyspnea scale;Knowledge and understanding of Target Heart Rate Range (THRR);Able to check pulse independently;Understanding of Exercise Prescription  Increase Physical Activity;Increase Strength and Stamina;Understanding of Exercise Prescription  Increase Physical Activity;Increase Strength and Stamina;Understanding of Exercise Prescription   Comments  Reviewed RPE and dyspnea scale, THR and program prescription with pt today.  Pt voiced understanding and was given a copy of goals to take home.  Reviewed RPE scale, THR and program prescription with pt today.  Pt voiced understanding and was given a copy of goals to take home.  Cody Day is tolerating exercise well and will work on Catering manager on TM.  Staff will monitor progress.  Cody Day has been doing well with rehab.  He has been holding steady at 2.1 METS on T5 NuStep and 2.8 METs on XR.  We will start to move up his workloads and continue to montior his progress.  Cody Day has been doing well in rehab.  He reduced his workloads some yesterday as he had been out sick the previous week.  He is glad to be back to class.  We will continue to monitor his progress.   Expected Outcomes  Short: Use RPE and dyspnea scales daily to regulate intensity. Long: Follow program prescription in THR.  Short: Use RPE daily to regulate intensity. Long: Follow program prescription in THR.  Short - exercise consistently Long - increase overall stamina  Short: Increase workloads. Long: Continue to improve stamina.  Short: Return to regular attendance.  Long: Continue to improve strength and stamina.   [place] Name 12/02/19 [id number] 12/14/19 1456           Exercise Goal  Re-Evaluation   Exercise Goals Review  Increase Physical Activity;Increase Strength and Stamina;Understanding of Exercise Prescription  Increase Physical Activity;Increase Strength and Stamina;Able to understand and use rate of perceived exertion (RPE) scale;Able to understand and use Dyspnea scale;Knowledge and understanding of Target Heart Rate Range (THRR);Able to check pulse independently;Understanding of Exercise Prescription      Comments  Cody Day is doing well in rehab.  His exercise at home has been chasing his 4 and 66 yo grandkids.  Reviewed home exercise with pt today.  Pt plans to walking at track at school for exercise.  Reviewed THR, pulse, RPE, sign and symptoms, and when to call 911 or MD.  Also discussed weather considerations and indoor options.  Pt voiced understanding.  Cody Day is back up to level 8 on RB.   He works at level 5 on [place].  He will progress more now that we are back to 2-3 days per week.      Expected Outcomes  Short: Walk consistently one extra day a week.  Long: Continue to improve stamina.  Short : exercsie consistently 3 days per week Long : increase MET level         Discharge Exercise Prescription (Final Exercise Prescription Changes): Exercise Prescription Changes - 12/14/19  1400      Response to Exercise   Blood Pressure (Admit)  138/76    Blood Pressure (Exercise)  184/88    Blood Pressure (Exit)  138/80    Heart Rate (Admit)  90 bpm    Heart Rate (Exercise)  113 bpm    Heart Rate (Exit)  105 bpm    Oxygen Saturation (Admit)  96 %    Oxygen Saturation (Exercise)  97 %    Oxygen Saturation (Exit)  97 %    Rating of Perceived Exertion (Exercise)  13    Perceived Dyspnea (Exercise)  3    Duration  Continue with 30 min of aerobic exercise without signs/symptoms of physical distress.    Intensity  THRR unchanged      Progression   Progression  Continue to progress workloads to maintain intensity without signs/symptoms of physical distress.    Average METs  3       Resistance Training   Training Prescription  Yes    Weight  6 lb    Reps  10-15      Recumbant Bike   Level  8    RPM  50    Watts  48    Minutes  15    METs  3.3      NuStep   Level  5    SPM  80    Minutes  15    METs  2.69       Nutrition:  Target Goals: Understanding of nutrition guidelines, daily intake of sodium <1571m, cholesterol <2034m calories 30% from fat and 7% or less from saturated fats, daily to have 5 or more servings of fruits and vegetables.  Biometrics: Pre Biometrics - 10/05/19 1455      Pre Biometrics   Height  5' 11.1" (1.806 m)    Weight  244 lb 11.2 oz (111 kg)    BMI (Calculated)  34.03    Single Leg Stand  3.12 seconds        Nutrition Therapy Plan and Nutrition Goals: Nutrition Therapy & Goals - 11/18/19 1401      Nutrition Therapy   Diet  Low Na, HH, DM    Protein (specify units)  90g    Fiber  30 grams    Whole Grain Foods  3 servings    Saturated Fats  12 max. grams    Fruits and Vegetables  5 servings/day    Sodium  1.5 grams      Personal Nutrition Goals   Nutrition Goal  ST: Add vegetables into burrito LT: like to get back to shape he was in 5 years ago, improve breathing and SOB    Comments  A1c. Pt using stevia for sweet tea instead of sugar. ~36 eggs/week. 2 scrambled egg burrito daily. May have peanut butter sandwich or some cookies. Sometimes have pasta salad with olive apple. Pt reports not salting anything. 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 Eats roasted unsalted nuts. Discussed HH eating. Suggested adding vegetables to burritos and making burritos whole wheat. Pt chose adding vegetables.      Intervention Plan   Intervention  Prescribe, educate and counsel regarding individualized specific dietary modifications aiming towards targeted core components such as weight, hypertension, lipid management, diabetes, heart failure and other  comorbidities.;Nutrition handout(s) given to patient.    Expected Outcomes  Short Term Goal: Understand basic principles of dietary content, such as calories, fat,  sodium, cholesterol and nutrients.;Short Term Goal: A plan has been developed with personal nutrition goals set during dietitian appointment.;Long Term Goal: Adherence to prescribed nutrition plan.       Nutrition Assessments: Nutrition Assessments - 10/05/19 1456      MEDFICTS Scores   Pre Score  77       Nutrition Goals Re-Evaluation:   Nutrition Goals Discharge (Final Nutrition Goals Re-Evaluation):   Psychosocial: Target Goals: Acknowledge presence or absence of significant depression and/or stress, maximize coping skills, provide positive support system. Participant is able to verbalize types and ability to use techniques and skills needed for reducing stress and depression.   Initial Review & Psychosocial Screening: Initial Psych Review & Screening - 10/04/19 1037      Initial Review   Current issues with  Current Anxiety/Panic;Current Depression      Family Dynamics   Good Support System?  Yes    Comments  He can look to his wife, daughter, son and his church for support.      Barriers   Psychosocial barriers to participate in program  The patient should benefit from training in stress management and relaxation.      Screening Interventions   Interventions  Provide feedback about the scores to participant;Encouraged to exercise;Program counselor consult;To provide support and resources with identified psychosocial needs    Expected Outcomes  Long Term Goal: Stressors or current issues are controlled or eliminated.;Short Term goal: Utilizing psychosocial counselor, staff and physician to assist with identification of specific Stressors or current issues interfering with healing process. Setting desired goal for each stressor or current issue identified.;Short Term goal: Identification and review with participant  of any Quality of Life or Depression concerns found by scoring the questionnaire.;Long Term goal: The participant improves quality of Life and PHQ9 Scores as seen by post scores and/or verbalization of changes       Quality of Life Scores:  Scores of 19 and below usually indicate a poorer quality of life in these areas.  A difference of  2-3 points is a clinically meaningful difference.  A difference of 2-3 points in the total score of the Quality of Life Index has been associated with significant improvement in overall quality of life, self-image, physical symptoms, and general health in studies assessing change in quality of life.  PHQ-9: Recent Review Flowsheet Data    Depression screen [facility] 2/9 10/05/2019 02/18/2019 08/18/2018 02/25/2017 08/27/2016   Decreased Interest 1 0 1 0 3   Down, Depressed, Hopeless 3 0 0 0 3   PHQ - 2 Score 4 0 1 0 6   Altered sleeping 0 - 3 3 3    Tired, decreased energy 3 - 3 3 3    Change in appetite 0 - 0 2 1   Feeling bad or failure about yourself  0 - 0 1 0   Trouble concentrating 1 - 0 0 0   Moving slowly or fidgety/restless 3 - 0 0 0   Suicidal thoughts 0 - 0 0 0   PHQ-9 Score 11 - 7 9 13    Difficult doing work/chores Somewhat difficult - Not difficult at all - Very difficult     Interpretation of Total Score  Total Score Depression Severity:  1-4 = Minimal depression, 5-9 = Mild depression, 10-14 = Moderate depression, 15-19 = Moderately severe depression, 20-27 = Severe depression   Psychosocial Evaluation and Intervention: Psychosocial Evaluation - 10/04/19 1043      Psychosocial Evaluation & Interventions   Interventions  Encouraged to exercise with the program and follow exercise prescription    Comments  He can look to his wife, daughter, son and his church for support. He states he has some depression and gets short of breath easily. He gets short of breath easily and wants to be able to do more without getting so short of breath.    Expected  Outcomes  Short: attend [facility] to decrease stress and improve SOB. Long: Exercise independently to keep stress at a minimum.    Continue Psychosocial Services   Follow up required by staff       Psychosocial Re-Evaluation: Psychosocial Re-Evaluation    [place] Name 11/11/19 [id number] 12/02/19 0838           Psychosocial Re-Evaluation   Current issues with  Current Stress Concerns;Current Sleep Concerns  Current Stress Concerns;Current Sleep Concerns;Current Psychotropic Meds;Current Anxiety/Panic      Comments  Cody Day states his meds take care of any stress/anxiety.  He sleeps well - has a Purple mattress and pillow.  Cody Day is doing well overall.  His meds keep him well mangaed and denies current anxiety symptoms.  He gets tired after meals and naps.  He has been helping to care for his grandkids during the week which keeps him busy.      Expected Outcomes  Short - continue to take meds as directed Long : maintain good sleep patterns  Short - continue to take meds as directed Long : maintain good sleep patterns      Interventions  --  Encouraged to attend Pulmonary Rehabilitation for the exercise      Continue Psychosocial Services   --  Follow up required by staff         Psychosocial Discharge (Final Psychosocial Re-Evaluation): Psychosocial Re-Evaluation - 12/02/19 3149      Psychosocial Re-Evaluation   Current issues with  Current Stress Concerns;Current Sleep Concerns;Current Psychotropic Meds;Current Anxiety/Panic    Comments  Cody Day is doing well overall.  His meds keep him well mangaed and denies current anxiety symptoms.  He gets tired after meals and naps.  He has been helping to care for his grandkids during the week which keeps him busy.    Expected Outcomes  Short - continue to take meds as directed Long : maintain good sleep patterns    Interventions  Encouraged to attend Pulmonary Rehabilitation for the exercise    Continue Psychosocial Services   Follow up required by staff        Education: Education Goals: Education classes will be provided on a weekly basis, covering required topics. Participant will state understanding/return demonstration of topics presented.  Learning Barriers/Preferences: Learning Barriers/Preferences - 10/04/19 1039      Learning Barriers/Preferences   Learning Barriers  None    Learning Preferences  None       Education Topics:  Initial Evaluation Education: - Verbal, written and demonstration of respiratory meds, oximetry and breathing techniques. Instruction on use of nebulizers and MDIs and importance of monitoring MDI activations.   Pulmonary Rehab from 10/05/2019 in [facility] Cardiac and Pulmonary Rehab  Date  10/04/19  Educator  [facility]  Instruction Review Code  1- Verbalizes Understanding      General Nutrition Guidelines/Fats and Fiber: -Group instruction provided by verbal, written material, models and posters to present the general guidelines for heart healthy nutrition. Gives an explanation and review of dietary fats and fiber.   Controlling Sodium/Reading Food Labels: -Group verbal and written material supporting the discussion of sodium  use in heart healthy nutrition. Review and explanation with models, verbal and written materials for utilization of the food label.   Exercise Physiology & General Exercise Guidelines: - Group verbal and written instruction with models to review the exercise physiology of the cardiovascular system and associated critical values. Provides general exercise guidelines with specific guidelines to those with heart or lung disease.    Aerobic Exercise & Resistance Training: - Gives group verbal and written instruction on the various components of exercise. Focuses on aerobic and resistive training programs and the benefits of this training and how to safely progress through these programs.   Flexibility, Balance, Mind/Body Relaxation: Provides group verbal/written instruction on the benefits  of flexibility and balance training, including mind/body exercise modes such as yoga, pilates and tai chi.  Demonstration and skill practice provided.   Stress and Anxiety: - Provides group verbal and written instruction about the health risks of elevated stress and causes of high stress.  Discuss the correlation between heart/lung disease and anxiety and treatment options. Review healthy ways to manage with stress and anxiety.   Depression: - Provides group verbal and written instruction on the correlation between heart/lung disease and depressed mood, treatment options, and the stigmas associated with seeking treatment.   Exercise & Equipment Safety: - Individual verbal instruction and demonstration of equipment use and safety with use of the equipment.   Pulmonary Rehab from 10/05/2019 in [facility] Cardiac and Pulmonary Rehab  Date  10/05/19  Educator  [facility]  Instruction Review Code  1- Verbalizes Understanding      Infection Prevention: - Provides verbal and written material to individual with discussion of infection control including proper hand washing and proper equipment cleaning during exercise session.   Pulmonary Rehab from 10/05/2019 in [facility] Cardiac and Pulmonary Rehab  Date  10/05/19  Educator  [facility]  Instruction Review Code  1- Verbalizes Understanding      Falls Prevention: - Provides verbal and written material to individual with discussion of falls prevention and safety.   Pulmonary Rehab from 10/05/2019 in [facility] Cardiac and Pulmonary Rehab  Date  10/04/19  Educator  [facility]  Instruction Review Code  1- Verbalizes Understanding      Diabetes: - Individual verbal and written instruction to review signs/symptoms of diabetes, desired ranges of glucose level fasting, after meals and with exercise. Advice that pre and post exercise glucose checks will be done for 3 sessions at entry of program.   Chronic Lung Diseases: - Group verbal and written instruction to review updates,  respiratory medications, advancements in procedures and treatments. Discuss use of supplemental oxygen including available portable oxygen systems, continuous and intermittent flow rates, concentrators, personal use and safety guidelines. Review proper use of inhaler and spacers. Provide informative websites for self-education.    Energy Conservation: - Provide group verbal and written instruction for methods to conserve energy, plan and organize activities. Instruct on pacing techniques, use of adaptive equipment and posture/positioning to relieve shortness of breath.   Triggers and Exacerbations: - Group verbal and written instruction to review types of environmental triggers and ways to prevent exacerbations. Discuss weather changes, air quality and the benefits of nasal washing. Review warning signs and symptoms to help prevent infections. Discuss techniques for effective airway clearance, coughing, and vibrations.   AED/CPR: - Group verbal and written instruction with the use of models to demonstrate the basic use of the AED with the basic ABC's of resuscitation.   Anatomy and Physiology of the Lungs: - Group verbal  and written instruction with the use of models to provide basic lung anatomy and physiology related to function, structure and complications of lung disease.   Anatomy & Physiology of the Heart: - Group verbal and written instruction and models provide basic cardiac anatomy and physiology, with the coronary electrical and arterial systems. Review of Valvular disease and Heart Failure   Cardiac Medications: - Group verbal and written instruction to review commonly prescribed medications for heart disease. Reviews the medication, class of the drug, and side effects.   Know Your Numbers and Risk Factors: -Group verbal and written instruction about important numbers in your health.  Discussion of what are risk factors and how they play a role in the disease process.  Review  of Cholesterol, Blood Pressure, Diabetes, and BMI and the role they play in your overall health.   Sleep Hygiene: -Provides group verbal and written instruction about how sleep can affect your health.  Define sleep hygiene, discuss sleep cycles and impact of sleep habits. Review good sleep hygiene tips.    Other: -Provides group and verbal instruction on various topics (see comments)    Knowledge Questionnaire Score: Knowledge Questionnaire Score - 10/05/19 1458      Knowledge Questionnaire Score   Pre Score  16/18 education focus: O2 safety, ADLs        Core Components/Risk Factors/Patient Goals at Admission: Personal Goals and Risk Factors at Admission - 10/05/19 1459      Core Components/Risk Factors/Patient Goals on Admission    Weight Management  Yes;Weight Loss    Intervention  Weight Management: Develop a combined nutrition and exercise program designed to reach desired caloric intake, while maintaining appropriate intake of nutrient and fiber, sodium and fats, and appropriate energy expenditure required for the weight goal.;Weight Management: Provide education and appropriate resources to help participant work on and attain dietary goals.;Weight Management/Obesity: Establish reasonable short term and long term weight goals.;Obesity: Provide education and appropriate resources to help participant work on and attain dietary goals.    Admit Weight  244 lb 11.2 oz (111 kg)    Goal Weight: Short Term  235 lb (106.6 kg)    Goal Weight: Long Term  230 lb (104.3 kg)    Expected Outcomes  Short Term: Continue to assess and modify interventions until short term weight is achieved;Weight Maintenance: Understanding of the daily nutrition guidelines, which includes 25-35% calories from fat, 7% or less cal from saturated fats, less than 228m cholesterol, less than 1.5gm of sodium, & 5 or more servings of fruits and vegetables daily;Weight Loss: Understanding of general recommendations for a  balanced deficit meal plan, which promotes 1-2 lb weight loss per week and includes a negative energy balance of [id number] kcal/d;Understanding recommendations for meals to include 15-35% energy as protein, 25-35% energy from fat, 35-60% energy from carbohydrates, less than 2037mof dietary cholesterol, 20-35 gm of total fiber daily;Understanding of distribution of calorie intake throughout the day with the consumption of 4-5 meals/snacks;Long Term: Adherence to nutrition and physical activity/exercise program aimed toward attainment of established weight goal    Tobacco Cessation  Yes    Intervention  Assist the participant in steps to quit. Provide individualized education and counseling about committing to Tobacco Cessation, relapse prevention, and pharmacological support that can be provided by physician.;OfAdvice workerassist with locating and accessing local/national Quit Smoking programs, and support quit date choice.    Expected Outcomes  Short Term: Will demonstrate readiness to quit, by selecting a quit date.;Long Term:  Complete abstinence from all tobacco products for at least 12 months from quit date.;Short Term: Will quit all tobacco product use, adhering to prevention of relapse plan.    Improve shortness of breath with ADL's  Yes    Intervention  Provide education, individualized exercise plan and daily activity instruction to help decrease symptoms of SOB with activities of daily living.    Expected Outcomes  Short Term: Improve cardiorespiratory fitness to achieve a reduction of symptoms when performing ADLs;Long Term: Be able to perform more ADLs without symptoms or delay the onset of symptoms    Hypertension  Yes    Intervention  Provide education on lifestyle modifcations including regular physical activity/exercise, weight management, moderate sodium restriction and increased consumption of fresh fruit, vegetables, and low fat dairy, alcohol moderation, and smoking  cessation.;Monitor prescription use compliance.    Expected Outcomes  Short Term: Continued assessment and intervention until BP is < 140/59m HG in hypertensive participants. < 130/865mHG in hypertensive participants with diabetes, heart failure or chronic kidney disease.;Long Term: Maintenance of blood pressure at goal levels.    Lipids  Yes    Intervention  Provide education and support for participant on nutrition & aerobic/resistive exercise along with prescribed medications to achieve LDL <7042mHDL >71m80m  Expected Outcomes  Short Term: Participant states understanding of desired cholesterol values and is compliant with medications prescribed. Participant is following exercise prescription and nutrition guidelines.;Long Term: Cholesterol controlled with medications as prescribed, with individualized exercise RX and with personalized nutrition plan. Value goals: LDL < 70mg42mL > 40 mg.       Core Components/Risk Factors/Patient Goals Review:  Goals and Risk Factor Review    Row Name 11/11/19 0817 12/02/19 0840           Core Components/Risk Factors/Patient Goals Review   Personal Goals Review  Weight Management/Obesity;Improve shortness of breath with ADL's;Hypertension;Lipids  Weight Management/Obesity;Improve shortness of breath with ADL's;Hypertension;Lipids      Review  Cody Day hasnt been checking BP at home.  [facility]  He does have a monitor at home.  He will start checking at home at least once a day.  His weight is holding at 247.  He does stretch when he gets up.  He can play with his grandkids for short periods.  He would like to be able to play more with them.  Cody Day has an appointment with Dr. AridaFletcher Anon week for a follow up.  He should get a good report.  His weight is creeping up and he hopes its muscle as he is feeling stronger overall.  He only eats twice a day as he doesn't move as much.  His pressures have been good and breathing is improving.  He  has found that he can now go up the stairs!      Expected Outcomes  Short :  [place] [employer] [place] Long :  walk up and down stairs without getting short of breath  Short: Continue to work on weight loss.  Long: Continue to exericse for weight loss and SOB.         Core Components/Risk Factors/Patient Goals at Discharge (Final Review):  Goals and Risk Factor Review - 12/02/19 0840      Core Components/Risk Factors/Patient Goals Review   Personal Goals Review  Weight Management/Obesity;Improve shortness of breath with ADL's;Hypertension;Lipids    Review  Cody Day has an appointment with Dr. AridaFletcher Anon  next week for a follow up.  He should get a good report.  His weight is creeping up and he hopes its muscle as he is feeling stronger overall.  He only eats twice a day as he doesn't move as much.  His pressures have been good and breathing is improving.  He has found that he can now go up the stairs!    Expected Outcomes  Short: Continue to work on weight loss.  Long: Continue to exericse for weight loss and SOB.       ITP Comments: ITP Comments    Row Name 10/05/19 1232 10/06/19 0856 10/08/19 0919 10/20/19 0912 11/04/19 0900   ITP Comments  Completed 6MWT and gym orientation.  Initial ITP created and sent for review to Dr. Emily Filbert, Medical Director.  First full day of exercise!  Patient was oriented to gym and equipment including functions, settings, policies, and procedures.  Patient's individual exercise prescription and treatment plan were reviewed.  All starting workloads were established based on the results of the 6 minute walk test done at initial orientation visit.  The plan for exercise progression was also introduced and progression will be customized based on patient's performance and goals.  First full day of exercise!  Patient was oriented to gym and equipment including functions, settings, policies, and procedures.  Patient's individual exercise prescription and  treatment plan were reviewed.  All starting workloads were established based on the results of the 6 minute walk test done at initial orientation visit.  The plan for exercise progression was also introduced and progression will be customized based on patient's performance and goals.  30 day review competed . ITP sent to Dr Emily Filbert for review, changes as needed and ITP approval signature  New tp program  Virtual call completed today. Calls are done every week that patient is not attending an onsite exercise session during the COVID 19 PHE Crisis.   Today's call included review of :their home exercise  Cody Day had ED visit yesterday for raw,swollen throat. Has been prescribed a Z Pack. He is aware to finish med and be symptom free before returning to program. He is taking it easy this week as he heals.   S[place]man Name 11/04/19 0910 11/17/19 1453 11/18/19 1425 12/15/19 0707     ITP Comments  Pt reports not being able to complete RD appointment due to recent ER visit and visiting grandchildren. Rescheduled RD evaluation with pt.  30 day review completed. ITP sent to Dr. Emily Filbert, Medical Director of Cardiac and Pulmonary Rehab. Continue with ITP unless changes are made by physician.  Department operating under reduced schedule until further notice by request from hospital leadership.  Completed Initial RD Eval  30 day chart review completed. ITP sent to Dr Zachery Dakins Medical Director, for review,changes as needed and signature.       Comments:

## 2019-12-16 ENCOUNTER — Other Ambulatory Visit: Payer: Self-pay

## 2019-12-16 ENCOUNTER — Encounter: Payer: Medicare Other | Admitting: *Deleted

## 2019-12-16 DIAGNOSIS — G4730 Sleep apnea, unspecified: Secondary | ICD-10-CM | POA: Diagnosis not present

## 2019-12-16 DIAGNOSIS — Z79899 Other long term (current) drug therapy: Secondary | ICD-10-CM | POA: Diagnosis not present

## 2019-12-16 DIAGNOSIS — E7800 Pure hypercholesterolemia, unspecified: Secondary | ICD-10-CM | POA: Diagnosis not present

## 2019-12-16 DIAGNOSIS — I10 Essential (primary) hypertension: Secondary | ICD-10-CM | POA: Diagnosis not present

## 2019-12-16 DIAGNOSIS — J4550 Severe persistent asthma, uncomplicated: Secondary | ICD-10-CM | POA: Diagnosis not present

## 2019-12-16 DIAGNOSIS — J449 Chronic obstructive pulmonary disease, unspecified: Secondary | ICD-10-CM | POA: Diagnosis not present

## 2019-12-16 NOTE — Progress Notes (Signed)
Daily Session Note  Patient Details  Name: Cody Day MRN: [id number] Date of Birth: 17-Mar-1956 Referring Provider:     Pulmonary Rehab from 10/05/2019 in [facility] Cardiac and Pulmonary Rehab  Referring Provider  [name] MD      Encounter Date: 12/16/2019  Check In: Session Check In - 12/16/19 0831      Check-In   Supervising physician immediately available to respond to emergencies  See telemetry face sheet for immediately available ER MD    Location  [facility]-Cardiac & Pulmonary Rehab    Staff Present  Heath Lark, RN, BSN, CCRP;Melissa [place] RDN, LDN;Joseph [employer], [place], ACSM CEP, Exercise Physiologist    Virtual Visit  No    Medication changes reported      No    Fall or balance concerns reported     No    Warm-up and Cool-down  Performed on first and last piece of equipment    Resistance Training Performed  Yes    VAD Patient?  No    PAD/SET Patient?  No      Pain Assessment   Currently in Pain?  No/denies          Social History   Tobacco Use  Smoking Status Former Smoker  . Packs/day: 0.25  . Years: 35.00  . Pack years: 8.75  . Types: Cigarettes  . Quit date: 10/22/1995  . Years since quitting: 24.1  Smokeless Tobacco Current User  . Types: Chew  Tobacco Comment   rarely smoked cigarettes and cigars.  states he smoked drugs mostly    Goals Met:  Proper associated with RPD/PD & O2 Sat Independence with exercise equipment Exercise tolerated well No report of cardiac concerns or symptoms  Goals Unmet:  Not Applicable  Comments: Pt able to follow exercise prescription today without complaint.  Will continue to monitor for progression.    Dr. Emily Filbert is Medical Director for [facility].

## 2019-12-20 ENCOUNTER — Telehealth: Payer: Self-pay | Admitting: *Deleted

## 2019-12-20 ENCOUNTER — Encounter: Payer: Self-pay | Admitting: *Deleted

## 2019-12-20 ENCOUNTER — Other Ambulatory Visit: Payer: Self-pay | Admitting: Physician Assistant

## 2019-12-20 DIAGNOSIS — J441 Chronic obstructive pulmonary disease with (acute) exacerbation: Secondary | ICD-10-CM

## 2019-12-20 DIAGNOSIS — J4550 Severe persistent asthma, uncomplicated: Secondary | ICD-10-CM

## 2019-12-20 DIAGNOSIS — Z20828 Contact with and (suspected) exposure to other viral communicable diseases: Secondary | ICD-10-CM | POA: Diagnosis not present

## 2019-12-20 DIAGNOSIS — Z03818 Encounter for observation for suspected exposure to other biological agents ruled out: Secondary | ICD-10-CM | POA: Diagnosis not present

## 2019-12-20 DIAGNOSIS — U071 COVID-19: Secondary | ICD-10-CM

## 2019-12-20 DIAGNOSIS — I10 Essential (primary) hypertension: Secondary | ICD-10-CM

## 2019-12-20 NOTE — Telephone Encounter (Signed)
Art's wife called to let us know that he tested positive for COVID-19 today.  They were instructed to stay out for at least 20 days.

## 2019-12-20 NOTE — Telephone Encounter (Signed)
Patient's wife called stating that she tested positive for covid and went for an infusion yesterday. Patient's wife stated that her husband went to [facility] yesterday and tested positive for covid. Mrs. Zulueta wants to know if he can get the infusion? Cody Day patient's wife that we do not schedule the infusions, but I will send the message to Dr. Diona Browner for her to review. Advised patient's wife that we do not get the results from [facility] if a patient has a test done there. Advised Mrs. Cherney if a test is done at [facility] the results goes into the system and the team that schedules the infusions reaches out to the patient to schedule it if they are a candidate.  Patient's wife stated that she has the telephone number to the place that she had the infusion yesterday. Mrs. Holle stated that she is going to reach out to them to see what they think. ER precautions were given to patient's wife and she verbalized understanding. Advised patient's wife that message will go back to Dr. Diona Browner.

## 2019-12-20 NOTE — Progress Notes (Signed)
  I connected by phone with Cody Day on 12/20/2019 at 3:39 PM to discuss the potential use of an new treatment for mild to moderate COVID-19 viral infection in non-hospitalized patients.  This patient is a 64 y.o. male that meets the FDA criteria for Emergency Use Authorization of bamlanivimab or casirivimab\imdevimab.  Has a (+) direct SARS-CoV-2 viral test result  Has mild or moderate COVID-19   Is ? 63 years of age and weighs ? 40 kg  Is NOT hospitalized due to COVID-19  Is NOT requiring oxygen therapy or requiring an increase in baseline oxygen flow rate due to COVID-19  Is within 10 days of symptom onset  Has at least one of the high risk factor(s) for progression to severe COVID-19 and/or hospitalization as defined in EUA.  Specific high risk criteria : Hypertension and COPD   I have spoken and communicated the following to the patient or parent/caregiver:  1. FDA has authorized the emergency use of bamlanivimab and casirivimab\imdevimab for the treatment of mild to moderate COVID-19 in adults and pediatric patients with positive results of direct SARS-CoV-2 viral testing who are 28 years of age and older weighing at least 40 kg, and who are at high risk for progressing to severe COVID-19 and/or hospitalization.  2. The significant known and potential risks and benefits of bamlanivimab and casirivimab\imdevimab, and the extent to which such potential risks and benefits are unknown.  3. Information on available alternative treatments and the risks and benefits of those alternatives, including clinical trials.  4. Patients treated with bamlanivimab and casirivimab\imdevimab should continue to self-isolate and use infection control measures (e.g., wear mask, isolate, social distance, avoid sharing personal items, clean and disinfect "high touch" surfaces, and frequent handwashing) according to CDC guidelines.   5. The patient or parent/caregiver has the option to accept or refuse  bamlanivimab or casirivimab\imdevimab .  After reviewing this information with the patient, The patient agreed to proceed with receiving the bamlanimivab infusion and will be provided a copy of the Fact sheet prior to receiving the infusion.   Sx onset 2/28. Directions given. Orders in. Infusion set up for tomorrow 12/21/19 @ 10:30am.   Angelena Form PA-C 12/20/2019 3:39 PM

## 2019-12-20 NOTE — Telephone Encounter (Signed)
Art called today to tell us his wife has tested positive for the  Covid virus.  He will be tested today and call us with his results.

## 2019-12-21 ENCOUNTER — Ambulatory Visit (HOSPITAL_COMMUNITY)
Admission: RE | Admit: 2019-12-21 | Discharge: 2019-12-21 | Disposition: A | Discharge status: Home / Self Care | Payer: Medicare Other | Source: Ambulatory Visit | Attending: Pulmonary Disease | Admitting: Pulmonary Disease

## 2019-12-21 DIAGNOSIS — I10 Essential (primary) hypertension: Secondary | ICD-10-CM | POA: Insufficient documentation

## 2019-12-21 DIAGNOSIS — Z23 Encounter for immunization: Secondary | ICD-10-CM | POA: Insufficient documentation

## 2019-12-21 DIAGNOSIS — U071 COVID-19: Secondary | ICD-10-CM | POA: Insufficient documentation

## 2019-12-21 DIAGNOSIS — J441 Chronic obstructive pulmonary disease with (acute) exacerbation: Secondary | ICD-10-CM | POA: Insufficient documentation

## 2019-12-21 MED ORDER — SODIUM CHLORIDE 0.9 % IV SOLN
700.0000 mg | Freq: Once | INTRAVENOUS | Status: AC
  Administered 2019-12-21: 700 mg via INTRAVENOUS
  Filled 2019-12-21: qty 20

## 2019-12-21 MED ORDER — ALBUTEROL SULFATE HFA 108 (90 BASE) MCG/ACT IN AERS: 2.0000 | INHALATION_SPRAY | Freq: Once | RESPIRATORY_TRACT | Status: DC | PRN

## 2019-12-21 MED ORDER — METHYLPREDNISOLONE SODIUM SUCC 125 MG IJ SOLR: 125.0000 mg | Freq: Once | INTRAMUSCULAR | Status: DC | PRN

## 2019-12-21 MED ORDER — DIPHENHYDRAMINE HCL 50 MG/ML IJ SOLN: 50.0000 mg | Freq: Once | INTRAMUSCULAR | Status: DC | PRN

## 2019-12-21 MED ORDER — SODIUM CHLORIDE 0.9 % IV SOLN
INTRAVENOUS | Status: DC | PRN
  Administered 2019-12-21: 250 mL via INTRAVENOUS

## 2019-12-21 MED ORDER — FAMOTIDINE IN NACL 20-0.9 MG/50ML-% IV SOLN: 20.0000 mg | Freq: Once | INTRAVENOUS | Status: DC | PRN

## 2019-12-21 MED ORDER — EPINEPHRINE 0.3 MG/0.3ML IJ SOAJ: 0.3000 mg | Freq: Once | INTRAMUSCULAR | Status: DC | PRN

## 2019-12-21 NOTE — Telephone Encounter (Signed)
Given he was [facility] at [facility].. he will not be called by [facility].  He is a great candidate for this.  Last I saw  to make the referral... we call with the following info:   [phone].  Please leave the  patient's name, date of birth, phone number, and MRN number if known.   Please call and leave this info, let pt and wife know.

## 2019-12-21 NOTE — Discharge Instructions (Signed)

## 2019-12-21 NOTE — Progress Notes (Signed)
  Diagnosis: COVID-19  Physician: Joya Gaskins  Procedure: [facility] Infusion Clinic Med: bamlanivimab infusion - Provided patient with bamlanimivab fact sheet for patients, parents and caregivers prior to infusion.  Complications: No immediate complications noted.  Discharge: Discharged home   Baxter Hire 12/21/2019

## 2019-12-21 NOTE — Telephone Encounter (Signed)
Per Dr. Diona Browner she got a note stating [name] has already been scheduled for the infusion.  He is scheduled today at 10:30 am.

## 2019-12-22 ENCOUNTER — Other Ambulatory Visit: Payer: Self-pay | Admitting: Urology

## 2019-12-22 ENCOUNTER — Ambulatory Visit (HOSPITAL_COMMUNITY): Payer: Medicare Other

## 2019-12-22 DIAGNOSIS — E291 Testicular hypofunction: Secondary | ICD-10-CM

## 2019-12-29 ENCOUNTER — Encounter: Payer: Self-pay | Admitting: *Deleted

## 2019-12-29 DIAGNOSIS — J4550 Severe persistent asthma, uncomplicated: Secondary | ICD-10-CM

## 2020-01-03 DIAGNOSIS — J4550 Severe persistent asthma, uncomplicated: Secondary | ICD-10-CM

## 2020-01-03 NOTE — Progress Notes (Signed)
RD unable to complete re-eval this evaluation cycle. Pt currently out due to COVID19 +ve diagnosis. Will f/u with goals after pt return to rehab.

## 2020-01-06 ENCOUNTER — Telehealth: Payer: Self-pay

## 2020-01-06 NOTE — Telephone Encounter (Signed)
Called pt to see if he was feeling better - pt will be able to come back the 22nd. Pt did not answer; voicemailbox not set up.

## 2020-01-10 ENCOUNTER — Encounter: Payer: Medicare Other | Attending: Internal Medicine | Admitting: *Deleted

## 2020-01-10 ENCOUNTER — Other Ambulatory Visit: Payer: Self-pay

## 2020-01-10 DIAGNOSIS — E7800 Pure hypercholesterolemia, unspecified: Secondary | ICD-10-CM | POA: Diagnosis not present

## 2020-01-10 DIAGNOSIS — G4730 Sleep apnea, unspecified: Secondary | ICD-10-CM | POA: Insufficient documentation

## 2020-01-10 DIAGNOSIS — J449 Chronic obstructive pulmonary disease, unspecified: Secondary | ICD-10-CM | POA: Diagnosis not present

## 2020-01-10 DIAGNOSIS — Z79899 Other long term (current) drug therapy: Secondary | ICD-10-CM | POA: Diagnosis not present

## 2020-01-10 DIAGNOSIS — I10 Essential (primary) hypertension: Secondary | ICD-10-CM | POA: Insufficient documentation

## 2020-01-10 DIAGNOSIS — Z87891 Personal history of nicotine dependence: Secondary | ICD-10-CM | POA: Diagnosis not present

## 2020-01-10 DIAGNOSIS — J4550 Severe persistent asthma, uncomplicated: Secondary | ICD-10-CM | POA: Diagnosis not present

## 2020-01-10 NOTE — Progress Notes (Signed)
Daily Session Note  Patient Details  Name: Cody Day MRN: [id number] Date of Birth: [birth year] Referring Provider:     Pulmonary Rehab from 10/05/2019 in [facility] Cardiac and Pulmonary Rehab  Referring Provider  [name] MD      Encounter Date: 01/10/2020  Check In: Session Check In - 01/10/20 0849      Check-In   Supervising physician immediately available to respond to emergencies  See telemetry face sheet for immediately available ER MD    Location  [facility]-Cardiac & Pulmonary Rehab    Staff Present  Heath Lark, RN, BSN, CCRP;Joseph Hood RCP,RRT,BSRT;Kelly Schlusser, BS, ACSM CEP, Exercise Physiologist;Jessica Ceex Haci, [place], RCEP, CCRP, CCET    Virtual Visit  No    Medication changes reported      No    Fall or balance concerns reported     No    Warm-up and Cool-down  Performed on first and last piece of equipment    Resistance Training Performed  Yes    VAD Patient?  No    PAD/SET Patient?  No      Pain Assessment   Currently in Pain?  No/denies          Social History   Tobacco Use  Smoking Status Former Smoker  . Packs/day: 0.25  . Years: 35.00  . Pack years: 8.75  . Types: Cigarettes  . Quit date: 10/22/1995  . Years since quitting: 24.2  Smokeless Tobacco Current User  . Types: Chew  Tobacco Comment   rarely smoked cigarettes and cigars.  states he smoked drugs mostly    Goals Met:  Independence with exercise equipment Exercise tolerated well No report of cardiac concerns or symptoms  Goals Unmet:  Not Applicable  Comments: Pt able to follow exercise prescription today without complaint.  Will continue to monitor for progression. Art returns to program after having COVID 19 virus.    Dr. Emily Filbert is Medical Director for [facility] and [facility].

## 2020-01-12 ENCOUNTER — Other Ambulatory Visit: Payer: Self-pay

## 2020-01-12 ENCOUNTER — Encounter: Payer: Self-pay | Admitting: *Deleted

## 2020-01-12 ENCOUNTER — Encounter: Payer: Medicare Other | Admitting: *Deleted

## 2020-01-12 DIAGNOSIS — J4550 Severe persistent asthma, uncomplicated: Secondary | ICD-10-CM | POA: Diagnosis not present

## 2020-01-12 DIAGNOSIS — E7800 Pure hypercholesterolemia, unspecified: Secondary | ICD-10-CM | POA: Diagnosis not present

## 2020-01-12 DIAGNOSIS — J449 Chronic obstructive pulmonary disease, unspecified: Secondary | ICD-10-CM | POA: Diagnosis not present

## 2020-01-12 DIAGNOSIS — I10 Essential (primary) hypertension: Secondary | ICD-10-CM | POA: Diagnosis not present

## 2020-01-12 DIAGNOSIS — Z79899 Other long term (current) drug therapy: Secondary | ICD-10-CM | POA: Diagnosis not present

## 2020-01-12 DIAGNOSIS — G4730 Sleep apnea, unspecified: Secondary | ICD-10-CM | POA: Diagnosis not present

## 2020-01-12 NOTE — Progress Notes (Signed)
Daily Session Note  Patient Details  Name: Cody Day MRN: [id number] Date of Birth: 02/11/1956 Referring Provider:     Pulmonary Rehab from 10/05/2019 in [facility] Cardiac and Pulmonary Rehab  Referring Provider  [name] MD      Encounter Date: 01/12/2020  Check In: Session Check In - 01/12/20 0903      Check-In   Supervising physician immediately available to respond to emergencies  See telemetry face sheet for immediately available ER MD    Location  [facility]-Cardiac & Pulmonary Rehab    Staff Present  Heath Lark, RN, BSN, CCRP;Amanda Sommer, BA, ACSM CEP, Exercise Physiologist;Joseph Hood RCP,RRT,BSRT    Virtual Visit  No    Medication changes reported      No    Fall or balance concerns reported     No    Warm-up and Cool-down  Performed on first and last piece of equipment    Resistance Training Performed  Yes    VAD Patient?  No    PAD/SET Patient?  No      Pain Assessment   Currently in Pain?  No/denies          Social History   Tobacco Use  Smoking Status Former Smoker  . Packs/day: 0.25  . Years: 35.00  . Pack years: 8.75  . Types: Cigarettes  . Quit date: 10/22/1995  . Years since quitting: 24.2  Smokeless Tobacco Current User  . Types: Chew  Tobacco Comment   rarely smoked cigarettes and cigars.  states he smoked drugs mostly    Goals Met:  Independence with exercise equipment Exercise tolerated well No report of cardiac concerns or symptoms  Goals Unmet:  Not Applicable  Comments: Pt able to follow exercise prescription today without complaint.  Will continue to monitor for progression.    Dr. Emily Filbert is Medical Director for [place] [facility].

## 2020-01-12 NOTE — Progress Notes (Signed)
Pulmonary Individual Treatment Plan  Patient Details  Name: Cody Day MRN: [id number] Date of Birth: [birth year] Referring Provider:     Pulmonary Rehab from 10/05/2019 in [facility] Cardiac and Pulmonary Re[facility]  Referring Provider  [name] MD      Initial Encounter Date:    Pulmonary Rehab from 10/05/2019 in [facility] Cardiac and Pulmonary Rehab  Date  10/05/19      Visit Diagnosis: Severe persistent chronic asthma without complication  Patient's Home Medications on Admission:  Current Outpatient Medications:  .  testosterone cypionate (DEPOTESTOSTERONE CYPIONATE) 200 MG/ML injection, INJECT 0.3 ML (60 MG TOTAL) INTO THE MUSCLE ONCE A WEEK, Disp: 10 mL, Rfl: 0 .  albuterol (VENTOLIN HFA) 108 (90 Base) MCG/ACT inhaler, Inhale 2 puffs into the lungs every 4 (four) hours as needed for wheezing or shortness of breath., Disp: 1 Inhaler, Rfl: 10 .  allopurinol (ZYLOPRIM) 300 MG tablet, TAKE 1 TABLET BY MOUTH DAILY, Disp: 90 tablet, Rfl: 1 .  citalopram (CELEXA) 40 MG tablet, Take 1 tablet (40 mg total) by mouth daily., Disp: 90 tablet, Rfl: 1 .  fluticasone (FLOVENT HFA) 220 MCG/ACT inhaler, Inhale 2 puffs into the lungs 2 (two) times daily., Disp: 1 Inhaler, Rfl: 5 .  lisinopril (ZESTRIL) 20 MG tablet, TAKE 1 TABLET BY MOUTH DAILY, Disp: 90 tablet, Rfl: 1 .  montelukast (SINGULAIR) 10 MG tablet, TAKE 1 TABLET BY MOUTH DAILY, Disp: 90 tablet, Rfl: 1 .  Multiple Vitamin (MULTIVITAMIN WITH MINERALS) TABS tablet, Take 1 tablet by mouth daily., Disp: 30 tablet, Rfl: 2 .  simvastatin (ZOCOR) 20 MG tablet, TAKE 1 TABLET BY MOUTH DAILY, Disp: 90 tablet, Rfl: 3 .  STIOLTO RESPIMAT 2.5-2.5 MCG/ACT AERS, USE 2 INHALATIONS EVERY DAY, Disp: 4 g, Rfl: 5  Past Medical History: Past Medical History:  Diagnosis Date  . Achilles tendon rupture 10/11   and repair  . Adenomatous polyp of colon 04/06/2010   Adenomatous polyps 3 in the descending colon, times one in the ascending colon [facility])  .  Asthma   . COPD (chronic obstructive pulmonary disease) ([place])   . Gout   . Hemorrhoids   . Hypercholesteremia   . Hypertension   . Sleep apnea    doesn't wear CPAP  . Syncope 10/11   in settin gof asthma exacerbation w coughing. Ech (10/11): EF > 55%, mild LVH, grade I diastolic dysfunction, nomral RV size and systolic function,. normal valves. Carotid US (10/11): minimal disease    Tobacco Use: Social History   Tobacco Use  Smoking Status Former Smoker  . Packs/day: 0.25  . Years: 35.00  . Pack years: 8.75  . Types: Cigarettes  . Quit date: 10/22/1995  . Years since quitting: 24.2  Smokeless Tobacco Current User  . Types: Chew  Tobacco Comment   rarely smoked cigarettes and cigars.  states he smoked drugs mostly    Labs: Recent Review Flowsheet Data    Labs for ITP Cardiac and Pulmonary Rehab Latest Ref Rng & Units 08/23/2016 04/01/2018 07/20/2018 02/15/2019 08/30/2019   Cholestrol 0 - 200 mg/dL 174 168 - 136 123   LDLCALC 0 - 99 mg/dL - 75 - 71 57   LDLDIRECT mg/dL 53.0 - - - -   HDL >39.00 mg/dL 97.10 81.60 - 44.50 51.00   Trlycerides 0.0 - 149.0 mg/dL 257.0(H) 58.0 - 103.0 76.0   Hemoglobin A1c 4.6 - 6.5 % 6.2 6.1 5.7(H) 6.8(H) 6.3   TCO2 0 - 100 mmol/L - - - - -  Pulmonary Assessment Scores: Pulmonary Assessment Scores    Row Name 10/05/19 1501         ADL UCSD   ADL Phase  Entry     SOB Score total  79     Rest  1     Walk  3     Stairs  4     Bath  5     Dress  4     Shop  4       CAT Score   CAT Score  25       mMRC Score   mMRC Score  4        UCSD: Self-administered rating of dyspnea associated with activities of daily living (ADLs) 6-point scale (0 = "not at all" to 5 = "maximal or unable to do because of breathlessness")  Scoring Scores range from 0 to 120.  Minimally important difference is 5 units  CAT: CAT can identify the health impairment of COPD patients and is better correlated with disease progression.  CAT has a scoring range  of zero to 40. The CAT score is classified into four groups of low (less than 10), medium (10 - 20), high (21-30) and very high (31-40) based on the impact level of disease on health status. A CAT score over 10 suggests significant symptoms.  A worsening CAT score could be explained by an exacerbation, poor medication adherence, poor inhaler technique, or progression of COPD or comorbid conditions.  CAT MCID is 2 points  mMRC: mMRC (Modified Medical Research Council) Dyspnea Scale is used to assess the degree of baseline functional disability in patients of respiratory disease due to dyspnea. No minimal important difference is established. A decrease in score of 1 point or greater is considered a positive change.   Pulmonary Function Assessment:   Exercise Target Goals: Exercise Program Goal: Individual exercise prescription set using results from initial 6 min walk test and THRR while considering  patient's activity barriers and safety.   Exercise Prescription Goal: Initial exercise prescription builds to 30-45 minutes a day of aerobic activity, 2-3 days per week.  Home exercise guidelines will be given to patient during program as part of exercise prescription that the participant will acknowledge.  Activity Barriers & Risk Stratification: Activity Barriers & Cardiac Risk Stratification - 10/05/19 1451      Activity Barriers & Cardiac Risk Stratification   Activity Barriers  Deconditioning;Muscular Weakness;Other (comment);Shortness of Breath;Balance Concerns    Comments  shuffles feet, tremors       6 Minute Walk: 6 Minute Walk    Row Name 10/05/19 1441         6 Minute Walk   Phase  Initial     Distance  994 feet     Walk Time  6 minutes     # of Rest Breaks  0     MPH  1.88     METS  2.82     RPE  15     Perceived Dyspnea   3     VO2 Peak  9.88     Symptoms  Yes (comment)     Comments  hip pain 3/10, SOB, knee pain 3/10, SOB     Resting HR  90 bpm     Resting BP   112/64     Resting Oxygen Saturation   97 %     Exercise Oxygen Saturation  during 6 min walk  95 %     Max  Ex. HR  125 bpm     Max Ex. BP  146/64     2 Minute Post BP  134/70       Interval HR   1 Minute HR  99     2 Minute HR  119     3 Minute HR  114     4 Minute HR  102     5 Minute HR  125     6 Minute HR  98     2 Minute Post HR  92     Interval Heart Rate?  Yes       Interval Oxygen   Interval Oxygen?  Yes     Baseline Oxygen Saturation %  97 %     1 Minute Oxygen Saturation %  96 %     1 Minute Liters of Oxygen  0 L Room Air     2 Minute Oxygen Saturation %  95 %     2 Minute Liters of Oxygen  0 L     3 Minute Oxygen Saturation %  96 %     3 Minute Liters of Oxygen  0 L     4 Minute Oxygen Saturation %  95 %     4 Minute Liters of Oxygen  0 L     5 Minute Oxygen Saturation %  95 %     5 Minute Liters of Oxygen  0 L     6 Minute Oxygen Saturation %  96 %     6 Minute Liters of Oxygen  0 L     2 Minute Post Oxygen Saturation %  97 %     2 Minute Post Liters of Oxygen  0 L       Oxygen Initial Assessment: Oxygen Initial Assessment - 10/04/19 1036      Home Oxygen   Home Oxygen Device  Home Concentrator    Sleep Oxygen Prescription  Continuous    Liters per minute  2    Home Exercise Oxygen Prescription  None    Home at Rest Exercise Oxygen Prescription  None    Compliance with Home Oxygen Use  Yes      Initial 6 min Walk   Oxygen Used  None      Program Oxygen Prescription   Program Oxygen Prescription  None      Intervention   Short Term Goals  To learn and exhibit compliance with exercise, home and travel O2 prescription;To learn and understand importance of maintaining oxygen saturations>88%;To learn and demonstrate proper use of respiratory medications;To learn and demonstrate proper pursed lip breathing techniques or other breathing techniques.;To learn and understand importance of monitoring SPO2 with pulse oximeter and demonstrate accurate use of the  pulse oximeter.    Long  Term Goals  Verbalizes importance of monitoring SPO2 with pulse oximeter and return demonstration;Exhibits proper breathing techniques, such as pursed lip breathing or other method taught during program session;Demonstrates proper use of MDI's;Exhibits compliance with exercise, home and travel O2 prescription;Maintenance of O2 saturations>88%;Compliance with respiratory medication       Oxygen Re-Evaluation: Oxygen Re-Evaluation    Row Name 10/06/19 0858 11/11/19 0844 12/02/19 0842         Program Oxygen Prescription   Program Oxygen Prescription  None  None  None       Home Oxygen   Home Oxygen Device  Home Concentrator  Home Concentrator  Home Concentrator     Sleep Oxygen  Prescription  Continuous  Continuous  Continuous     Liters per minute  2  2  2      Home Exercise Oxygen Prescription  None  None  None     Home at Rest Exercise Oxygen Prescription  None  None  None     Compliance with Home Oxygen Use  Yes  Yes  Yes       Goals/Expected Outcomes   Short Term Goals  To learn and understand importance of monitoring SPO2 with pulse oximeter and demonstrate accurate use of the pulse oximeter.;To learn and understand importance of maintaining oxygen saturations>88%;To learn and demonstrate proper pursed lip breathing techniques or other breathing techniques.  To learn and exhibit compliance with exercise, home and travel O2 prescription;To learn and understand importance of monitoring SPO2 with pulse oximeter and demonstrate accurate use of the pulse oximeter.;To learn and understand importance of maintaining oxygen saturations>88%;To learn and demonstrate proper pursed lip breathing techniques or other breathing techniques.;To learn and demonstrate proper use of respiratory medications  To learn and exhibit compliance with exercise, home and travel O2 prescription;To learn and understand importance of monitoring SPO2 with pulse oximeter and demonstrate accurate use  of the pulse oximeter.;To learn and understand importance of maintaining oxygen saturations>88%;To learn and demonstrate proper pursed lip breathing techniques or other breathing techniques.;To learn and demonstrate proper use of respiratory medications     Long  Term Goals  Verbalizes importance of monitoring SPO2 with pulse oximeter and return demonstration;Maintenance of O2 saturations>88%;Exhibits proper breathing techniques, such as pursed lip breathing or other method taught during program session  Exhibits compliance with exercise, home and travel O2 prescription;Verbalizes importance of monitoring SPO2 with pulse oximeter and return demonstration;Maintenance of O2 saturations>88%;Exhibits proper breathing techniques, such as pursed lip breathing or other method taught during program session;Compliance with respiratory medication;Demonstrates proper use of MDI's  Exhibits compliance with exercise, home and travel O2 prescription;Verbalizes importance of monitoring SPO2 with pulse oximeter and return demonstration;Maintenance of O2 saturations>88%;Exhibits proper breathing techniques, such as pursed lip breathing or other method taught during program session;Compliance with respiratory medication;Demonstrates proper use of MDI's     Comments  Reviewed PLB technique with pt.  Talked about how it works and it's importance in maintaining their exercise saturations.  Reviewed PLB - pt is using inhalers as directed.  Pateient doesnt currently have oximeter at home.  He will order one and check O2 at home  Art is doing well with oxygen at night, but he does still snore.  He is using PLB at home.  He is keeping a close eye on his saturations and they are good for most part.     Goals/Expected Outcomes  Short: Become more profiecient at using PLB.   Long: Become independent at using PLB.  Short :  continue to practice PLB Long ; become independent using PLB  Short: Continue to montior saturations and use PLB.   Long: Continue to manage pulmonary disease.        Oxygen Discharge (Final Oxygen Re-Evaluation): Oxygen Re-Evaluation - 12/02/19 0842      Program Oxygen Prescription   Program Oxygen Prescription  None      Home Oxygen   Home Oxygen Device  Home Concentrator    Sleep Oxygen Prescription  Continuous    Liters per minute  2    Home Exercise Oxygen Prescription  None    Home at Rest Exercise Oxygen Prescription  None    Compliance with Home Oxygen Use  Yes      Goals/Expected Outcomes   Short Term Goals  To learn and exhibit compliance with exercise, home and travel O2 prescription;To learn and understand importance of monitoring SPO2 with pulse oximeter and demonstrate accurate use of the pulse oximeter.;To learn and understand importance of maintaining oxygen saturations>88%;To learn and demonstrate proper pursed lip breathing techniques or other breathing techniques.;To learn and demonstrate proper use of respiratory medications    Long  Term Goals  Exhibits compliance with exercise, home and travel O2 prescription;Verbalizes importance of monitoring SPO2 with pulse oximeter and return demonstration;Maintenance of O2 saturations>88%;Exhibits proper breathing techniques, such as pursed lip breathing or other method taught during program session;Compliance with respiratory medication;Demonstrates proper use of MDI's    Comments  Art is doing well with oxygen at night, but he does still snore.  He is using PLB at home.  He is keeping a close eye on his saturations and they are good for most part.    Goals/Expected Outcomes  Short: Continue to montior saturations and use PLB.  Long: Continue to manage pulmonary disease.       Initial Exercise Prescription: Initial Exercise Prescription - 10/05/19 1400      Date of Initial Exercise RX and Referring Provider   Date  10/05/19    Referring Provider  [name] MD      Treadmill   MPH  1.6    Grade  0.5    Minutes  15    METs  2.34       NuStep   Level  2    SPM  80    Minutes  15    METs  2      REL-XR   Level  1    Speed  50    Minutes  15    METs  2      T5 Nustep   Level  2    SPM  80    Minutes  15    METs  2      Prescription Details   Frequency (times per week)  3    Duration  Progress to 30 minutes of continuous aerobic without signs/symptoms of physical distress      Intensity   THRR 40-80% of Max Heartrate  117-144    Ratings of Perceived Exertion  11-13    Perceived Dyspnea  0-4      Progression   Progression  Continue to progress workloads to maintain intensity without signs/symptoms of physical distress.      Resistance Training   Training Prescription  Yes    Weight  3 lb    Reps  10-15       Perform Capillary Blood Glucose checks as needed.  Exercise Prescription Changes: Exercise Prescription Changes    Row Name 10/05/19 1400 10/21/19 1300 11/05/19 1000 11/15/19 1700 12/01/19 1000     Response to Exercise   Blood Pressure (Admit)  112/64  136/70  142/80  138/78  150/80   Blood Pressure (Exercise)  146/64  164/84  140/66  148/70  180/80   Blood Pressure (Exit)  142/70  -  128/80  130/80  156/76   Heart Rate (Admit)  90 bpm  97 bpm  98 bpm  95 bpm  90 bpm   Heart Rate (Exercise)  125 bpm  106 bpm  103 bpm  113 bpm  122 bpm   Heart Rate (Exit)  95 bpm  101 bpm  98 bpm  90 bpm  104 bpm   Oxygen Saturation (Admit)  97 %  97 %  96 %  95 %  97 %   Oxygen Saturation (Exercise)  95 %  96 %  96 %  94 %  94 %   Oxygen Saturation (Exit)  98 %  96 %  96 %  96 %  97 %   Rating of Perceived Exertion (Exercise)  15  14  15  13  13    Perceived Dyspnea (Exercise)  3  3  3   -  3   Symptoms  hip pain 3/10, knee pain 3/10, SOB  -  SOB  -  fatigue   Comments  walk test results  -  -  -  -   Duration  -  Continue with 30 min of aerobic exercise without signs/symptoms of physical distress.  Continue with 30 min of aerobic exercise without signs/symptoms of physical distress.  Continue with 30  min of aerobic exercise without signs/symptoms of physical distress.  Continue with 30 min of aerobic exercise without signs/symptoms of physical distress.   Intensity  -  THRR unchanged  THRR unchanged  THRR unchanged  THRR unchanged     Progression   Progression  -  Continue to progress workloads to maintain intensity without signs/symptoms of physical distress.  Continue to progress workloads to maintain intensity without signs/symptoms of physical distress.  Continue to progress workloads to maintain intensity without signs/symptoms of physical distress.  Continue to progress workloads to maintain intensity without signs/symptoms of physical distress.   Average METs  -  2.2  2.46  2.2  2.2     Resistance Training   Training Prescription  -  Yes  Yes  Yes  Yes   Weight  -  3 lb  6 lb  6 lb  6 lb   Reps  -  10-15  10-15  10-15  10-15     Interval Training   Interval Training  -  -  No  No  No     Treadmill   MPH  -  1.6  1.8  -  -   Grade  -  0.5  0.5  -  -   Minutes  -  15  15  -  -   METs  -  2.34  2.5  -  -     Recumbant Bike   Level  -  -  -  8  5   RPM  -  -  -  50  -   Minutes  -  -  -  15  15   METs  -  -  -  2.2  -     NuStep   Level  -  2  -  2  4   SPM  -  80  -  80  -   Minutes  -  15  -  15  15   METs  -  2  -  2  2.2     REL-XR   Level  -  -  1  -  -   Minutes  -  -  15  -  -   METs  -  -  2.8  -  -     T5 Nustep   Level  -  -  2  -  -   Minutes  -  -  15  -  -  METs  -  -  2.1  -  -   Row Name 12/14/19 1400             Response to Exercise   Blood Pressure (Admit)  138/76       Blood Pressure (Exercise)  184/88       Blood Pressure (Exit)  138/80       Heart Rate (Admit)  90 bpm       Heart Rate (Exercise)  113 bpm       Heart Rate (Exit)  105 bpm       Oxygen Saturation (Admit)  96 %       Oxygen Saturation (Exercise)  97 %       Oxygen Saturation (Exit)  97 %       Rating of Perceived Exertion (Exercise)  13       Perceived Dyspnea  (Exercise)  3       Duration  Continue with 30 min of aerobic exercise without signs/symptoms of physical distress.       Intensity  THRR unchanged         Progression   Progression  Continue to progress workloads to maintain intensity without signs/symptoms of physical distress.       Average METs  3         Resistance Training   Training Prescription  Yes       Weight  6 lb       Reps  10-15         Recumbant Bike   Level  8       RPM  50       Watts  48       Minutes  15       METs  3.3         NuStep   Level  5       SPM  80       Minutes  15       METs  2.69          Exercise Comments: Exercise Comments    Row Name 10/06/19 0856 10/08/19 0920 11/04/19 0900       Exercise Comments  First full day of exercise!  Patient was oriented to gym and equipment including functions, settings, policies, and procedures.  Patient's individual exercise prescription and treatment plan were reviewed.  All starting workloads were established based on the results of the 6 minute walk test done at initial orientation visit.  The plan for exercise progression was also introduced and progression will be customized based on patient's performance and goals.  First full day of exercise!  Patient was oriented to gym and equipment including functions, settings, policies, and procedures.  Patient's individual exercise prescription and treatment plan were reviewed.  All starting workloads were established based on the results of the 6 minute walk test done at initial orientation visit.  The plan for exercise progression was also introduced and progression will be customized based on patient's performance and goals.  Virtual call completed today. Calls are done every week that patient is not attending an onsite exercise session during the COVID 19 PHE Crisis.   Today's call included review of :their home exercise  Art had ED visit yesterday for raw,swollen throat. Has been prescribed a Z Pack. He is aware to  finish med and be symptom free before returning to program. He is taking it easy this week as he heals.  Exercise Goals and Review: Exercise Goals    Row Name 10/05/19 1455             Exercise Goals   Increase Physical Activity  Yes       Intervention  Provide advice, education, support and counseling about physical activity/exercise needs.;Develop an individualized exercise prescription for aerobic and resistive training based on initial evaluation findings, risk stratification, comorbidities and participant's personal goals.       Expected Outcomes  Short Term: Attend rehab on a regular basis to increase amount of physical activity.;Long Term: Add in home exercise to make exercise part of routine and to increase amount of physical activity.;Long Term: Exercising regularly at least 3-5 days a week.       Increase Strength and Stamina  Yes       Intervention  Provide advice, education, support and counseling about physical activity/exercise needs.;Develop an individualized exercise prescription for aerobic and resistive training based on initial evaluation findings, risk stratification, comorbidities and participant's personal goals.       Expected Outcomes  Short Term: Increase workloads from initial exercise prescription for resistance, speed, and METs.;Short Term: Perform resistance training exercises routinely during rehab and add in resistance training at home;Long Term: Improve cardiorespiratory fitness, muscular endurance and strength as measured by increased METs and functional capacity (6MWT)       Able to understand and use rate of perceived exertion (RPE) scale  Yes       Intervention  Provide education and explanation on how to use RPE scale       Expected Outcomes  Long Term:  Able to use RPE to guide intensity level when exercising independently;Short Term: Able to use RPE daily in rehab to express subjective intensity level       Able to understand and use Dyspnea scale   Yes       Intervention  Provide education and explanation on how to use Dyspnea scale       Expected Outcomes  Short Term: Able to use Dyspnea scale daily in rehab to express subjective sense of shortness of breath during exertion;Long Term: Able to use Dyspnea scale to guide intensity level when exercising independently       Knowledge and understanding of Target Heart Rate Range (THRR)  Yes       Intervention  Provide education and explanation of THRR including how the numbers were predicted and where they are located for reference       Expected Outcomes  Short Term: Able to state/look up THRR;Short Term: Able to use daily as guideline for intensity in rehab;Long Term: Able to use THRR to govern intensity when exercising independently       Able to check pulse independently  Yes       Intervention  Provide education and demonstration on how to check pulse in carotid and radial arteries.;Review the importance of being able to check your own pulse for safety during independent exercise       Expected Outcomes  Short Term: Able to explain why pulse checking is important during independent exercise;Long Term: Able to check pulse independently and accurately       Understanding of Exercise Prescription  Yes       Intervention  Provide education, explanation, and written materials on patient's individual exercise prescription       Expected Outcomes  Short Term: Able to explain program exercise prescription;Long Term: Able to explain home exercise prescription to exercise independently  Exercise Goals Re-Evaluation : Exercise Goals Re-Evaluation    Row Name 10/06/19 0857 10/08/19 0920 10/21/19 1344 11/05/19 1006 12/01/19 1049     Exercise Goal Re-Evaluation   Exercise Goals Review  Able to understand and use rate of perceived exertion (RPE) scale;Able to understand and use Dyspnea scale;Knowledge and understanding of Target Heart Rate Range (THRR);Understanding of Exercise Prescription  Able  to understand and use rate of perceived exertion (RPE) scale;Knowledge and understanding of Target Heart Rate Range (THRR);Understanding of Exercise Prescription  Able to understand and use rate of perceived exertion (RPE) scale;Able to understand and use Dyspnea scale;Knowledge and understanding of Target Heart Rate Range (THRR);Able to check pulse independently;Understanding of Exercise Prescription  Increase Physical Activity;Increase Strength and Stamina;Understanding of Exercise Prescription  Increase Physical Activity;Increase Strength and Stamina;Understanding of Exercise Prescription   Comments  Reviewed RPE and dyspnea scale, THR and program prescription with pt today.  Pt voiced understanding and was given a copy of goals to take home.  Reviewed RPE scale, THR and program prescription with pt today.  Pt voiced understanding and was given a copy of goals to take home.  Art is tolerating exercise well and will work on Catering manager on TM.  Staff will monitor progress.  Art has been doing well with rehab.  He has been holding steady at 2.1 METS on T5 NuStep and 2.8 METs on XR.  We will start to move up his workloads and continue to montior his progress.  Art has been doing well in rehab.  He reduced his workloads some yesterday as he had been out sick the previous week.  He is glad to be back to class.  We will continue to monitor his progress.   Expected Outcomes  Short: Use RPE and dyspnea scales daily to regulate intensity. Long: Follow program prescription in THR.  Short: Use RPE daily to regulate intensity. Long: Follow program prescription in THR.  Short - exercise consistently Long - increase overall stamina  Short: Increase workloads. Long: Continue to improve stamina.  Short: Return to regular attendance.  Long: Continue to improve strength and stamina.   [place] Name 12/02/19 2836 12/14/19 1456 12/29/19 1325         Exercise Goal Re-Evaluation   Exercise Goals Review  Increase Physical  Activity;Increase Strength and Stamina;Understanding of Exercise Prescription  Increase Physical Activity;Increase Strength and Stamina;Able to understand and use rate of perceived exertion (RPE) scale;Able to understand and use Dyspnea scale;Knowledge and understanding of Target Heart Rate Range (THRR);Able to check pulse independently;Understanding of Exercise Prescription  -     Comments  Art is doing well in rehab.  His exercise at home has been chasing his 4 and 42 yo grandkids.  Reviewed home exercise with pt today.  Pt plans to walking at track at school for exercise.  Reviewed THR, pulse, RPE, sign and symptoms, and when to call 911 or MD.  Also discussed weather considerations and indoor options.  Pt voiced understanding.  Art is back up to level 8 on RB.   He works at level 5 on [place].  He will progress more now that we are back to 2-3 days per week.  Out since last review with COVID-19     Expected Outcomes  Short: Walk consistently one extra day a week.  Long: Continue to improve stamina.  Short : exercsie consistently 3 days per week Long : increase MET level  -        Discharge Exercise Prescription (Final  Exercise Prescription Changes): Exercise Prescription Changes - 12/14/19 1400      Response to Exercise   Blood Pressure (Admit)  138/76    Blood Pressure (Exercise)  184/88    Blood Pressure (Exit)  138/80    Heart Rate (Admit)  90 bpm    Heart Rate (Exercise)  113 bpm    Heart Rate (Exit)  105 bpm    Oxygen Saturation (Admit)  96 %    Oxygen Saturation (Exercise)  97 %    Oxygen Saturation (Exit)  97 %    Rating of Perceived Exertion (Exercise)  13    Perceived Dyspnea (Exercise)  3    Duration  Continue with 30 min of aerobic exercise without signs/symptoms of physical distress.    Intensity  THRR unchanged      Progression   Progression  Continue to progress workloads to maintain intensity without signs/symptoms of physical distress.    Average METs  3      Resistance  Training   Training Prescription  Yes    Weight  6 lb    Reps  10-15      Recumbant Bike   Level  8    RPM  50    Watts  48    Minutes  15    METs  3.3      NuStep   Level  5    SPM  80    Minutes  15    METs  2.69       Nutrition:  Target Goals: Understanding of nutrition guidelines, daily intake of sodium <1563m, cholesterol <2047m calories 30% from fat and 7% or less from saturated fats, daily to have 5 or more servings of fruits and vegetables.  Biometrics: Pre Biometrics - 10/05/19 1455      Pre Biometrics   Height  5' 11.1" (1.806 m)    Weight  244 lb 11.2 oz (111 kg)    BMI (Calculated)  34.03    Single Leg Stand  3.12 seconds        Nutrition Therapy Plan and Nutrition Goals: Nutrition Therapy & Goals - 11/18/19 1401      Nutrition Therapy   Diet  Low Na, HH, DM    Protein (specify units)  90g    Fiber  30 grams    Whole Grain Foods  3 servings    Saturated Fats  12 max. grams    Fruits and Vegetables  5 servings/day    Sodium  1.5 grams      Personal Nutrition Goals   Nutrition Goal  ST: Add vegetables into burrito LT: like to get back to shape he was in 5 years ago, improve breathing and SOB    Comments  A1c. Pt using stevia for sweet tea instead of sugar. ~36 eggs/week. 2 scrambled egg burrito daily. May have peanut butter sandwich or some cookies. Sometimes have pasta salad with olive apple. Pt reports not salting anything. 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 Eats roasted unsalted nuts. Discussed HH eating. Suggested adding vegetables to burritos and making burritos whole wheat. Pt chose adding vegetables.      Intervention Plan   Intervention  Prescribe, educate and counsel regarding individualized specific dietary modifications aiming towards targeted core components such as weight, hypertension, lipid management, diabetes, heart failure and other comorbidities.;Nutrition  handout(s) given to patient.    Expected Outcomes  Short Term Goal: Understand basic  principles of dietary content, such as calories, fat, sodium, cholesterol and nutrients.;Short Term Goal: A plan has been developed with personal nutrition goals set during dietitian appointment.;Long Term Goal: Adherence to prescribed nutrition plan.       Nutrition Assessments: Nutrition Assessments - 10/05/19 1456      MEDFICTS Scores   Pre Score  77       Nutrition Goals Re-Evaluation: Nutrition Goals Re-Evaluation    Row Name 01/03/20 0842             Goals   Nutrition Goal  ST: Add vegetables into burrito LT: like to get back to shape he was in 5 years ago, improve breathing and SOB       Comment  RD unable to complete re-eval this evaluation cycle. Pt currently out due to COVID19 +ve diagnosis. Will f/u with goals after pt return to rehab.       Expected Outcome  Pt return to rehab          Nutrition Goals Discharge (Final Nutrition Goals Re-Evaluation): Nutrition Goals Re-Evaluation - 01/03/20 6415      Goals   Nutrition Goal  ST: Add vegetables into burrito LT: like to get back to shape he was in 5 years ago, improve breathing and SOB    Comment  RD unable to complete re-eval this evaluation cycle. Pt currently out due to COVID19 +ve diagnosis. Will f/u with goals after pt return to rehab.    Expected Outcome  Pt return to rehab       Psychosocial: Target Goals: Acknowledge presence or absence of significant depression and/or stress, maximize coping skills, provide positive support system. Participant is able to verbalize types and ability to use techniques and skills needed for reducing stress and depression.   Initial Review & Psychosocial Screening: Initial Psych Review & Screening - 10/04/19 1037      Initial Review   Current issues with  Current Anxiety/Panic;Current Depression      Family Dynamics   Good Support System?  Yes    Comments  He can look to his wife,  daughter, son and his church for support.      Barriers   Psychosocial barriers to participate in program  The patient should benefit from training in stress management and relaxation.      Screening Interventions   Interventions  Provide feedback about the scores to participant;Encouraged to exercise;Program counselor consult;To provide support and resources with identified psychosocial needs    Expected Outcomes  Long Term Goal: Stressors or current issues are controlled or eliminated.;Short Term goal: Utilizing psychosocial counselor, staff and physician to assist with identification of specific Stressors or current issues interfering with healing process. Setting desired goal for each stressor or current issue identified.;Short Term goal: Identification and review with participant of any Quality of Life or Depression concerns found by scoring the questionnaire.;Long Term goal: The participant improves quality of Life and PHQ9 Scores as seen by post scores and/or verbalization of changes       Quality of Life Scores:  Scores of 19 and below usually indicate a poorer quality of life in these areas.  A difference of  2-3 points is a clinically meaningful difference.  A difference of 2-3 points in the total score of the Quality of Life Index has been associated with significant improvement in overall quality of life, self-image, physical symptoms, and general health in studies assessing change in quality of life.  PHQ-9: Recent Review Flowsheet Data  Depression screen [facility] 2/9 10/05/2019 02/18/2019 08/18/2018 02/25/2017 08/27/2016   Decreased Interest 1 0 1 0 3   Down, Depressed, Hopeless 3 0 0 0 3   PHQ - 2 Score 4 0 1 0 6   Altered sleeping 0 - 3 3 3    Tired, decreased energy 3 - 3 3 3    Change in appetite 0 - 0 2 1   Feeling bad or failure about yourself  0 - 0 1 0   Trouble concentrating 1 - 0 0 0   Moving slowly or fidgety/restless 3 - 0 0 0   Suicidal thoughts 0 - 0 0 0   PHQ-9 Score  11 - 7 9 13    Difficult doing work/chores Somewhat difficult - Not difficult at all - Very difficult     Interpretation of Total Score  Total Score Depression Severity:  1-4 = Minimal depression, 5-9 = Mild depression, 10-14 = Moderate depression, 15-19 = Moderately severe depression, 20-27 = Severe depression   Psychosocial Evaluation and Intervention: Psychosocial Evaluation - 10/04/19 1043      Psychosocial Evaluation & Interventions   Interventions  Encouraged to exercise with the program and follow exercise prescription    Comments  He can look to his wife, daughter, son and his church for support. He states he has some depression and gets short of breath easily. He gets short of breath easily and wants to be able to do more without getting so short of breath.    Expected Outcomes  Short: attend [facility] to decrease stress and improve SOB. Long: Exercise independently to keep stress at a minimum.    Continue Psychosocial Services   Follow up required by staff       Psychosocial Re-Evaluation: Psychosocial Re-Evaluation    [place] Name 11/11/19 [phone] 12/02/19 0838           Psychosocial Re-Evaluation   Current issues with  Current Stress Concerns;Current Sleep Concerns  Current Stress Concerns;Current Sleep Concerns;Current Psychotropic Meds;Current Anxiety/Panic      Comments  Art states his meds take care of any stress/anxiety.  He sleeps well - has a Purple mattress and pillow.  Art is doing well overall.  His meds keep him well mangaed and denies current anxiety symptoms.  He gets tired after meals and naps.  He has been helping to care for his grandkids during the week which keeps him busy.      Expected Outcomes  Short - continue to take meds as directed Long : maintain good sleep patterns  Short - continue to take meds as directed Long : maintain good sleep patterns      Interventions  -  Encouraged to attend Pulmonary Rehabilitation for the exercise      Continue Psychosocial  Services   -  Follow up required by staff         Psychosocial Discharge (Final Psychosocial Re-Evaluation): Psychosocial Re-Evaluation - 12/02/19 8921      Psychosocial Re-Evaluation   Current issues with  Current Stress Concerns;Current Sleep Concerns;Current Psychotropic Meds;Current Anxiety/Panic    Comments  Art is doing well overall.  His meds keep him well mangaed and denies current anxiety symptoms.  He gets tired after meals and naps.  He has been helping to care for his grandkids during the week which keeps him busy.    Expected Outcomes  Short - continue to take meds as directed Long : maintain good sleep patterns    Interventions  Encouraged to attend Pulmonary  Rehabilitation for the exercise    Continue Psychosocial Services   Follow up required by staff       Education: Education Goals: Education classes will be provided on a weekly basis, covering required topics. Participant will state understanding/return demonstration of topics presented.  Learning Barriers/Preferences: Learning Barriers/Preferences - 10/04/19 1039      Learning Barriers/Preferences   Learning Barriers  None    Learning Preferences  None       Education Topics:  Initial Evaluation Education: - Verbal, written and demonstration of respiratory meds, oximetry and breathing techniques. Instruction on use of nebulizers and MDIs and importance of monitoring MDI activations.   Pulmonary Rehab from 10/05/2019 in [facility] Cardiac and Pulmonary Rehab  Date  10/04/19  Educator  [facility]  Instruction Review Code  1- Verbalizes Understanding      General Nutrition Guidelines/Fats and Fiber: -Group instruction provided by verbal, written material, models and posters to present the general guidelines for heart healthy nutrition. Gives an explanation and review of dietary fats and fiber.   Controlling Sodium/Reading Food Labels: -Group verbal and written material supporting the discussion of sodium use in heart  healthy nutrition. Review and explanation with models, verbal and written materials for utilization of the food label.   Exercise Physiology & General Exercise Guidelines: - Group verbal and written instruction with models to review the exercise physiology of the cardiovascular system and associated critical values. Provides general exercise guidelines with specific guidelines to those with heart or lung disease.    Aerobic Exercise & Resistance Training: - Gives group verbal and written instruction on the various components of exercise. Focuses on aerobic and resistive training programs and the benefits of this training and how to safely progress through these programs.   Flexibility, Balance, Mind/Body Relaxation: Provides group verbal/written instruction on the benefits of flexibility and balance training, including mind/body exercise modes such as yoga, pilates and tai chi.  Demonstration and skill practice provided.   Stress and Anxiety: - Provides group verbal and written instruction about the health risks of elevated stress and causes of high stress.  Discuss the correlation between heart/lung disease and anxiety and treatment options. Review healthy ways to manage with stress and anxiety.   Depression: - Provides group verbal and written instruction on the correlation between heart/lung disease and depressed mood, treatment options, and the stigmas associated with seeking treatment.   Exercise & Equipment Safety: - Individual verbal instruction and demonstration of equipment use and safety with use of the equipment.   Pulmonary Rehab from 10/05/2019 in [facility] Cardiac and Pulmonary Rehab  Date  10/05/19  Educator  [facility]  Instruction Review Code  1- Verbalizes Understanding      Infection Prevention: - Provides verbal and written material to individual with discussion of infection control including proper hand washing and proper equipment cleaning during exercise session.    Pulmonary Rehab from 10/05/2019 in [facility] Cardiac and Pulmonary Rehab  Date  10/05/19  Educator  [facility]  Instruction Review Code  1- Verbalizes Understanding      Falls Prevention: - Provides verbal and written material to individual with discussion of falls prevention and safety.   Pulmonary Rehab from 10/05/2019 in [facility] Cardiac and Pulmonary Rehab  Date  10/04/19  Educator  [facility]  Instruction Review Code  1- Verbalizes Understanding      Diabetes: - Individual verbal and written instruction to review signs/symptoms of diabetes, desired ranges of glucose level fasting, after meals and with exercise. Advice that pre and  post exercise glucose checks will be done for 3 sessions at entry of program.   Chronic Lung Diseases: - Group verbal and written instruction to review updates, respiratory medications, advancements in procedures and treatments. Discuss use of supplemental oxygen including available portable oxygen systems, continuous and intermittent flow rates, concentrators, personal use and safety guidelines. Review proper use of inhaler and spacers. Provide informative websites for self-education.    Energy Conservation: - Provide group verbal and written instruction for methods to conserve energy, plan and organize activities. Instruct on pacing techniques, use of adaptive equipment and posture/positioning to relieve shortness of breath.   Triggers and Exacerbations: - Group verbal and written instruction to review types of environmental triggers and ways to prevent exacerbations. Discuss weather changes, air quality and the benefits of nasal washing. Review warning signs and symptoms to help prevent infections. Discuss techniques for effective airway clearance, coughing, and vibrations.   AED/CPR: - Group verbal and written instruction with the use of models to demonstrate the basic use of the AED with the basic ABC's of resuscitation.   Anatomy and Physiology of the Lungs: - Group  verbal and written instruction with the use of models to provide basic lung anatomy and physiology related to function, structure and complications of lung disease.   Anatomy & Physiology of the Heart: - Group verbal and written instruction and models provide basic cardiac anatomy and physiology, with the coronary electrical and arterial systems. Review of Valvular disease and Heart Failure   Cardiac Medications: - Group verbal and written instruction to review commonly prescribed medications for heart disease. Reviews the medication, class of the drug, and side effects.   Know Your Numbers and Risk Factors: -Group verbal and written instruction about important numbers in your health.  Discussion of what are risk factors and how they play a role in the disease process.  Review of Cholesterol, Blood Pressure, Diabetes, and BMI and the role they play in your overall health.   Sleep Hygiene: -Provides group verbal and written instruction about how sleep can affect your health.  Define sleep hygiene, discuss sleep cycles and impact of sleep habits. Review good sleep hygiene tips.    Other: -Provides group and verbal instruction on various topics (see comments)    Knowledge Questionnaire Score: Knowledge Questionnaire Score - 10/05/19 1458      Knowledge Questionnaire Score   Pre Score  16/18 education focus: O2 safety, ADLs        Core Components/Risk Factors/Patient Goals at Admission: Personal Goals and Risk Factors at Admission - 10/05/19 1459      Core Components/Risk Factors/Patient Goals on Admission    Weight Management  Yes;Weight Loss    Intervention  Weight Management: Develop a combined nutrition and exercise program designed to reach desired caloric intake, while maintaining appropriate intake of nutrient and fiber, sodium and fats, and appropriate energy expenditure required for the weight goal.;Weight Management: Provide education and appropriate resources to help  participant work on and attain dietary goals.;Weight Management/Obesity: Establish reasonable short term and long term weight goals.;Obesity: Provide education and appropriate resources to help participant work on and attain dietary goals.    Admit Weight  244 lb 11.2 oz (111 kg)    Goal Weight: Short Term  235 lb (106.6 kg)    Goal Weight: Long Term  230 lb (104.3 kg)    Expected Outcomes  Short Term: Continue to assess and modify interventions until short term weight is achieved;Weight Maintenance: Understanding of the daily nutrition  guidelines, which includes 25-35% calories from fat, 7% or less cal from saturated fats, less than 247m cholesterol, less than 1.5gm of sodium, & 5 or more servings of fruits and vegetables daily;Weight Loss: Understanding of general recommendations for a balanced deficit meal plan, which promotes 1-2 lb weight loss per week and includes a negative energy balance of [phone] kcal/d;Understanding recommendations for meals to include 15-35% energy as protein, 25-35% energy from fat, 35-60% energy from carbohydrates, less than 2050mof dietary cholesterol, 20-35 gm of total fiber daily;Understanding of distribution of calorie intake throughout the day with the consumption of 4-5 meals/snacks;Long Term: Adherence to nutrition and physical activity/exercise program aimed toward attainment of established weight goal    Tobacco Cessation  Yes    Intervention  Assist the participant in steps to quit. Provide individualized education and counseling about committing to Tobacco Cessation, relapse prevention, and pharmacological support that can be provided by physician.;OfAdvice workerassist with locating and accessing local/national Quit Smoking programs, and support quit date choice.    Expected Outcomes  Short Term: Will demonstrate readiness to quit, by selecting a quit date.;Long Term: Complete abstinence from all tobacco products for at least 12 months from quit  date.;Short Term: Will quit all tobacco product use, adhering to prevention of relapse plan.    Improve shortness of breath with ADL's  Yes    Intervention  Provide education, individualized exercise plan and daily activity instruction to help decrease symptoms of SOB with activities of daily living.    Expected Outcomes  Short Term: Improve cardiorespiratory fitness to achieve a reduction of symptoms when performing ADLs;Long Term: Be able to perform more ADLs without symptoms or delay the onset of symptoms    Hypertension  Yes    Intervention  Provide education on lifestyle modifcations including regular physical activity/exercise, weight management, moderate sodium restriction and increased consumption of fresh fruit, vegetables, and low fat dairy, alcohol moderation, and smoking cessation.;Monitor prescription use compliance.    Expected Outcomes  Short Term: Continued assessment and intervention until BP is < 140/9031mG in hypertensive participants. < 130/52m43m in hypertensive participants with diabetes, heart failure or chronic kidney disease.;Long Term: Maintenance of blood pressure at goal levels.    Lipids  Yes    Intervention  Provide education and support for participant on nutrition & aerobic/resistive exercise along with prescribed medications to achieve LDL <70mg56mL >40mg.35mExpected Outcomes  Short Term: Participant states understanding of desired cholesterol values and is compliant with medications prescribed. Participant is following exercise prescription and nutrition guidelines.;Long Term: Cholesterol controlled with medications as prescribed, with individualized exercise RX and with personalized nutrition plan. Value goals: LDL < 70mg, 24m> 40 mg.       Core Components/Risk Factors/Patient Goals Review:  Goals and Risk Factor Review    Row Name 11/11/19 0817 12/02/19 0840           Core Components/Risk Factors/Patient Goals Review   Personal Goals Review  Weight  Management/Obesity;Improve shortness of breath with ADL's;Hypertension;Lipids  Weight Management/Obesity;Improve shortness of breath with ADL's;Hypertension;Lipids      Review  Art hasnt been checking BP at home.  [facility]  He does have a monitor at home.  He will start checking at home at least once a day.  His weight is holding at 247.  He does stretch when he gets up.  He can play with his grandkids for short periods.  He would like to  be able to play more with them.  Art has an appointment with Dr. Fletcher Anon next week for a follow up.  He should get a good report.  His weight is creeping up and he hopes its muscle as he is feeling stronger overall.  He only eats twice a day as he doesn't move as much.  His pressures have been good and breathing is improving.  He has found that he can now go up the stairs!      Expected Outcomes  Short :  walk track at [employer] or the mall 2 days per week Long :  walk up and down stairs without getting short of breath  Short: Continue to work on weight loss.  Long: Continue to exericse for weight loss and SOB.         Core Components/Risk Factors/Patient Goals at Discharge (Final Review):  Goals and Risk Factor Review - 12/02/19 0840      Core Components/Risk Factors/Patient Goals Review   Personal Goals Review  Weight Management/Obesity;Improve shortness of breath with ADL's;Hypertension;Lipids    Review  Art has an appointment with Dr. Fletcher Anon next week for a follow up.  He should get a good report.  His weight is creeping up and he hopes its muscle as he is feeling stronger overall.  He only eats twice a day as he doesn't move as much.  His pressures have been good and breathing is improving.  He has found that he can now go up the stairs!    Expected Outcomes  Short: Continue to work on weight loss.  Long: Continue to exericse for weight loss and SOB.       ITP Comments: ITP Comments    Row Name 10/05/19 1232 10/06/19 0856 10/08/19  0919 10/20/19 0912 11/04/19 0900   ITP Comments  Completed 6MWT and gym orientation.  Initial ITP created and sent for review to Dr. Emily Filbert, Medical Director.  First full day of exercise!  Patient was oriented to gym and equipment including functions, settings, policies, and procedures.  Patient's individual exercise prescription and treatment plan were reviewed.  All starting workloads were established based on the results of the 6 minute walk test done at initial orientation visit.  The plan for exercise progression was also introduced and progression will be customized based on patient's performance and goals.  First full day of exercise!  Patient was oriented to gym and equipment including functions, settings, policies, and procedures.  Patient's individual exercise prescription and treatment plan were reviewed.  All starting workloads were established based on the results of the 6 minute walk test done at initial orientation visit.  The plan for exercise progression was also introduced and progression will be customized based on patient's performance and goals.  30 day review competed . ITP sent to Dr Emily Filbert for review, changes as needed and ITP approval signature  New tp program  Virtual call completed today. Calls are done every week that patient is not attending an onsite exercise session during the COVID 19 PHE Crisis.   Today's call included review of :their home exercise  Art had ED visit yesterday for raw,swollen throat. Has been prescribed a Z Pack. He is aware to finish med and be symptom free before returning to program. He is taking it easy this week as he heals.   Row Name 11/04/19 0910 11/17/19 1453 11/18/19 1425 12/15/19 0707 12/20/19 1408   ITP Comments  Pt reports not being able to complete RD  appointment due to recent ER visit and visiting grandchildren. Rescheduled RD evaluation with pt.  30 day review completed. ITP sent to Dr. Emily Filbert, Medical Director of Cardiac and  Pulmonary Rehab. Continue with ITP unless changes are made by physician.  Department operating under reduced schedule until further notice by request from hospital leadership.  Completed Initial RD Eval  30 day chart review completed. ITP sent to Dr Zachery Dakins Medical Director, for review,changes as needed and signature.  Art's wife called to let us know that he tested positive for COVID-19 today.  They were instructed to stay out for at least 20 days.   [place] Name 01/03/20 0842 01/12/20 1013         ITP Comments  RD unable to complete re-eval this evaluation cycle. Pt currently out due to COVID19 +ve diagnosis. Will f/u with goals after pt return to rehab.  30 day chart review completed. ITP sent to Dr Zachery Dakins Medical Director, for review,changes as needed and signature.  Has returned after hving COVID 19 virus         Comments:

## 2020-01-14 ENCOUNTER — Encounter: Payer: Medicare Other | Admitting: *Deleted

## 2020-01-14 ENCOUNTER — Other Ambulatory Visit: Payer: Self-pay

## 2020-01-14 DIAGNOSIS — I10 Essential (primary) hypertension: Secondary | ICD-10-CM | POA: Diagnosis not present

## 2020-01-14 DIAGNOSIS — E7800 Pure hypercholesterolemia, unspecified: Secondary | ICD-10-CM | POA: Diagnosis not present

## 2020-01-14 DIAGNOSIS — Z79899 Other long term (current) drug therapy: Secondary | ICD-10-CM | POA: Diagnosis not present

## 2020-01-14 DIAGNOSIS — J4550 Severe persistent asthma, uncomplicated: Secondary | ICD-10-CM

## 2020-01-14 DIAGNOSIS — G4730 Sleep apnea, unspecified: Secondary | ICD-10-CM | POA: Diagnosis not present

## 2020-01-14 DIAGNOSIS — J449 Chronic obstructive pulmonary disease, unspecified: Secondary | ICD-10-CM | POA: Diagnosis not present

## 2020-01-14 NOTE — Progress Notes (Signed)
Daily Session Note  Patient Details  Name: Cody Day MRN: [id number] Date of Birth: [birth year] Referring Provider:     Pulmonary Rehab from 10/05/2019 in [facility] Cardiac and Pulmonary Rehab  Referring Provider  [name] MD      Encounter Date: 01/14/2020  Check In: Session Check In - 01/14/20 0842      Check-In   Supervising physician immediately available to respond to emergencies  See telemetry face sheet for immediately available ER MD    Location  [facility]-Cardiac & Pulmonary Rehab    Staff Present  Heath Lark, RN, BSN, CCRP;Joseph Hood RCP,RRT,BSRT;Jessica [place], [place], [place], [place], CCET    Virtual Visit  No    Medication changes reported      No    Fall or balance concerns reported     No    Warm-up and Cool-down  Performed on first and last piece of equipment    Resistance Training Performed  Yes    VAD Patient?  No    PAD/SET Patient?  No      Pain Assessment   Currently in Pain?  No/denies          Social History   Tobacco Use  Smoking Status Former Smoker  . Packs/day: 0.25  . Years: 35.00  . Pack years: 8.75  . Types: Cigarettes  . Quit date: 10/22/1995  . Years since quitting: 24.2  Smokeless Tobacco Current User  . Types: Chew  Tobacco Comment   rarely smoked cigarettes and cigars.  states he smoked drugs mostly    Goals Met:  Proper associated with RPD/PD & O2 Sat Independence with exercise equipment Exercise tolerated well No report of cardiac concerns or symptoms  Goals Unmet:  Not Applicable  Comments: Pt able to follow exercise prescription today without complaint.  Will continue to monitor for progression.    Dr. Emily Filbert is Medical Director for [place] and [facility].

## 2020-01-17 ENCOUNTER — Other Ambulatory Visit: Payer: Self-pay | Admitting: Family Medicine

## 2020-01-17 ENCOUNTER — Encounter: Payer: Medicare Other | Admitting: *Deleted

## 2020-01-17 ENCOUNTER — Other Ambulatory Visit: Payer: Self-pay

## 2020-01-17 DIAGNOSIS — G4730 Sleep apnea, unspecified: Secondary | ICD-10-CM | POA: Diagnosis not present

## 2020-01-17 DIAGNOSIS — J4550 Severe persistent asthma, uncomplicated: Secondary | ICD-10-CM

## 2020-01-17 DIAGNOSIS — E7800 Pure hypercholesterolemia, unspecified: Secondary | ICD-10-CM | POA: Diagnosis not present

## 2020-01-17 DIAGNOSIS — J449 Chronic obstructive pulmonary disease, unspecified: Secondary | ICD-10-CM | POA: Diagnosis not present

## 2020-01-17 DIAGNOSIS — I10 Essential (primary) hypertension: Secondary | ICD-10-CM | POA: Diagnosis not present

## 2020-01-17 DIAGNOSIS — Z79899 Other long term (current) drug therapy: Secondary | ICD-10-CM | POA: Diagnosis not present

## 2020-01-17 NOTE — Progress Notes (Signed)
Daily Session Note  Patient Details  Name: Cody Day MRN: [id number] Date of Birth: 09/07/1956 Referring Provider:     Pulmonary Rehab from 10/05/2019 in [facility] Cardiac and Pulmonary Rehab  Referring Provider  Kasa, Kurain MD      Encounter Date: 01/17/2020  Check In: Session Check In - 01/17/20 0825      Check-In   Supervising physician immediately available to respond to emergencies  See telemetry face sheet for immediately available ER MD    Location  [facility]-Cardiac & Pulmonary Rehab    Staff Present  Susanne Bice, RN, BSN, CCRP;Kelly Hayes, BS, ACSM CEP, Exercise Physiologist;Joseph Hood RCP,RRT,BSRT    Virtual Visit  No    Medication changes reported      No    Fall or balance concerns reported     No    Warm-up and Cool-down  Performed on first and last piece of equipment    Resistance Training Performed  Yes    VAD Patient?  No    PAD/SET Patient?  No      Pain Assessment   Currently in Pain?  No/denies          Social History   Tobacco Use  Smoking Status Former Smoker  . Packs/day: 0.25  . Years: 35.00  . Pack years: 8.75  . Types: Cigarettes  . Quit date: 10/22/1995  . Years since quitting: 24.2  Smokeless Tobacco Current User  . Types: Chew  Tobacco Comment   rarely smoked cigarettes and cigars.  states he smoked drugs mostly    Goals Met:  Proper associated with RPD/PD & O2 Sat Independence with exercise equipment Exercise tolerated well Personal goals reviewed No report of cardiac concerns or symptoms  Goals Unmet:  Not Applicable  Comments: Pt able to follow exercise prescription today without complaint.  Will continue to monitor for progression.    Dr. Mark Miller is Medical Director for [facility] and [facility]. 

## 2020-01-21 ENCOUNTER — Encounter: Payer: Medicare Other | Attending: Internal Medicine | Admitting: *Deleted

## 2020-01-21 ENCOUNTER — Other Ambulatory Visit: Payer: Self-pay

## 2020-01-21 DIAGNOSIS — Z87891 Personal history of nicotine dependence: Secondary | ICD-10-CM | POA: Insufficient documentation

## 2020-01-21 DIAGNOSIS — J449 Chronic obstructive pulmonary disease, unspecified: Secondary | ICD-10-CM | POA: Insufficient documentation

## 2020-01-21 DIAGNOSIS — Z79899 Other long term (current) drug therapy: Secondary | ICD-10-CM | POA: Insufficient documentation

## 2020-01-21 DIAGNOSIS — E7800 Pure hypercholesterolemia, unspecified: Secondary | ICD-10-CM | POA: Diagnosis not present

## 2020-01-21 DIAGNOSIS — J4550 Severe persistent asthma, uncomplicated: Secondary | ICD-10-CM | POA: Insufficient documentation

## 2020-01-21 DIAGNOSIS — I10 Essential (primary) hypertension: Secondary | ICD-10-CM | POA: Insufficient documentation

## 2020-01-21 DIAGNOSIS — G4730 Sleep apnea, unspecified: Secondary | ICD-10-CM | POA: Insufficient documentation

## 2020-01-21 NOTE — Progress Notes (Signed)
Daily Session Note  Patient Details  Name: Cody Day MRN: [id number] Date of Birth: 11-29-1955 Referring Provider:     Pulmonary Rehab from 10/05/2019 in [facility] Cardiac and Pulmonary Rehab  Referring Provider  [name] MD      Encounter Date: 01/21/2020  Check In: Session Check In - 01/21/20 0858      Check-In   Supervising physician immediately available to respond to emergencies  See telemetry face sheet for immediately available ER MD    Location  [facility]-Cardiac & Pulmonary Rehab    Staff Present  Heath Lark, RN, BSN, CCRP;Meredith Sherryll Burger, RN BSN;Joseph Hood RCP,RRT,BSRT    Virtual Visit  No    Medication changes reported      No    Fall or balance concerns reported     No    Warm-up and Cool-down  Performed on first and last piece of equipment    Resistance Training Performed  Yes    VAD Patient?  No    PAD/SET Patient?  No      Pain Assessment   Currently in Pain?  No/denies          Social History   Tobacco Use  Smoking Status Former Smoker  . Packs/day: 0.25  . Years: 35.00  . Pack years: 8.75  . Types: Cigarettes  . Quit date: 10/22/1995  . Years since quitting: 24.2  Smokeless Tobacco Current User  . Types: Chew  Tobacco Comment   rarely smoked cigarettes and cigars.  states he smoked drugs mostly    Goals Met:  Proper associated with RPD/PD & O2 Sat Independence with exercise equipment Exercise tolerated well No report of cardiac concerns or symptoms  Goals Unmet:  Not Applicable  Comments: Pt able to follow exercise prescription today without complaint.  Will continue to monitor for progression.    Dr. Emily Filbert is Medical Director for [place] and [facility].

## 2020-01-24 ENCOUNTER — Other Ambulatory Visit: Payer: Self-pay

## 2020-01-24 ENCOUNTER — Encounter: Payer: Medicare Other | Admitting: *Deleted

## 2020-01-24 DIAGNOSIS — E7800 Pure hypercholesterolemia, unspecified: Secondary | ICD-10-CM | POA: Diagnosis not present

## 2020-01-24 DIAGNOSIS — Z79899 Other long term (current) drug therapy: Secondary | ICD-10-CM | POA: Diagnosis not present

## 2020-01-24 DIAGNOSIS — I10 Essential (primary) hypertension: Secondary | ICD-10-CM | POA: Diagnosis not present

## 2020-01-24 DIAGNOSIS — G4730 Sleep apnea, unspecified: Secondary | ICD-10-CM | POA: Diagnosis not present

## 2020-01-24 DIAGNOSIS — J449 Chronic obstructive pulmonary disease, unspecified: Secondary | ICD-10-CM | POA: Diagnosis not present

## 2020-01-24 DIAGNOSIS — J4550 Severe persistent asthma, uncomplicated: Secondary | ICD-10-CM

## 2020-01-24 NOTE — Progress Notes (Signed)
Daily Session Note  Patient Details  Name: Cody Day MRN: [id number] Date of Birth: 1956-05-06 Referring Provider:     Pulmonary Rehab from 10/05/2019 in [facility] Cardiac and Pulmonary Rehab  Referring Provider  [name] MD      Encounter Date: 01/24/2020  Check In: Session Check In - 01/24/20 0821      Check-In   Location  [facility]-Cardiac & Pulmonary Rehab    Staff Present  Heath Lark, RN, BSN, Laveda Norman, BS, ACSM CEP, Exercise Physiologist;Joseph Tessie Fass RCP,RRT,BSRT    Virtual Visit  No    Medication changes reported      No    Fall or balance concerns reported     No    Warm-up and Cool-down  Performed on first and last piece of equipment    Resistance Training Performed  Yes    VAD Patient?  No    PAD/SET Patient?  No      Pain Assessment   Currently in Pain?  No/denies          Social History   Tobacco Use  Smoking Status Former Smoker  . Packs/day: 0.25  . Years: 35.00  . Pack years: 8.75  . Types: Cigarettes  . Quit date: 10/22/1995  . Years since quitting: 24.2  Smokeless Tobacco Current User  . Types: Chew  Tobacco Comment   rarely smoked cigarettes and cigars.  states he smoked drugs mostly    Goals Met:  Proper associated with RPD/PD & O2 Sat Independence with exercise equipment Exercise tolerated well No report of cardiac concerns or symptoms  Goals Unmet:  Not Applicable  Comments: Pt able to follow exercise prescription today without complaint.  Will continue to monitor for progression.    Dr. Emily Filbert is Medical Director for [place] and [facility].

## 2020-01-28 ENCOUNTER — Encounter: Payer: Medicare Other | Admitting: *Deleted

## 2020-01-28 ENCOUNTER — Other Ambulatory Visit: Payer: Self-pay

## 2020-01-28 DIAGNOSIS — E7800 Pure hypercholesterolemia, unspecified: Secondary | ICD-10-CM | POA: Diagnosis not present

## 2020-01-28 DIAGNOSIS — J449 Chronic obstructive pulmonary disease, unspecified: Secondary | ICD-10-CM | POA: Diagnosis not present

## 2020-01-28 DIAGNOSIS — Z79899 Other long term (current) drug therapy: Secondary | ICD-10-CM | POA: Diagnosis not present

## 2020-01-28 DIAGNOSIS — J4550 Severe persistent asthma, uncomplicated: Secondary | ICD-10-CM | POA: Diagnosis not present

## 2020-01-28 DIAGNOSIS — I10 Essential (primary) hypertension: Secondary | ICD-10-CM | POA: Diagnosis not present

## 2020-01-28 DIAGNOSIS — G4730 Sleep apnea, unspecified: Secondary | ICD-10-CM | POA: Diagnosis not present

## 2020-01-28 NOTE — Progress Notes (Signed)
Daily Session Note  Patient Details  Name: Cody Day MRN: [id number] Date of Birth: [birth year] Referring Provider:     Pulmonary Rehab from 10/05/2019 in [facility] Cardiac and Pulmonary Rehab  Referring Provider  [name] MD      Encounter Date: 01/28/2020  Check In: Session Check In - 01/28/20 0830      Check-In   Supervising physician immediately available to respond to emergencies  See telemetry face sheet for immediately available ER MD    Location  [facility]-Cardiac & Pulmonary Rehab    Staff Present  Heath Lark, RN, BSN, CCRP;Amanda Sommer, BA, ACSM CEP, Exercise Physiologist;Joseph Hood RCP,RRT,BSRT    Virtual Visit  No    Medication changes reported      No    Fall or balance concerns reported     No    Warm-up and Cool-down  Performed on first and last piece of equipment    Resistance Training Performed  Yes    VAD Patient?  No    PAD/SET Patient?  No      Pain Assessment   Currently in Pain?  No/denies          Social History   Tobacco Use  Smoking Status Former Smoker  . Packs/day: 0.25  . Years: 35.00  . Pack years: 8.75  . Types: Cigarettes  . Quit date: 10/22/1995  . Years since quitting: 24.2  Smokeless Tobacco Current User  . Types: Chew  Tobacco Comment   rarely smoked cigarettes and cigars.  states he smoked drugs mostly    Goals Met:  Proper associated with RPD/PD & O2 Sat Independence with exercise equipment Exercise tolerated well No report of cardiac concerns or symptoms  Goals Unmet:  Not Applicable  Comments: Pt able to follow exercise prescription today without complaint.  Will continue to monitor for progression.    Dr. Emily Filbert is Medical Director for [place] and [facility].

## 2020-01-31 ENCOUNTER — Other Ambulatory Visit: Payer: Self-pay

## 2020-01-31 ENCOUNTER — Encounter: Payer: Medicare Other | Admitting: *Deleted

## 2020-01-31 DIAGNOSIS — I10 Essential (primary) hypertension: Secondary | ICD-10-CM | POA: Diagnosis not present

## 2020-01-31 DIAGNOSIS — J449 Chronic obstructive pulmonary disease, unspecified: Secondary | ICD-10-CM | POA: Diagnosis not present

## 2020-01-31 DIAGNOSIS — G4730 Sleep apnea, unspecified: Secondary | ICD-10-CM | POA: Diagnosis not present

## 2020-01-31 DIAGNOSIS — J4550 Severe persistent asthma, uncomplicated: Secondary | ICD-10-CM | POA: Diagnosis not present

## 2020-01-31 DIAGNOSIS — E7800 Pure hypercholesterolemia, unspecified: Secondary | ICD-10-CM | POA: Diagnosis not present

## 2020-01-31 DIAGNOSIS — Z79899 Other long term (current) drug therapy: Secondary | ICD-10-CM | POA: Diagnosis not present

## 2020-01-31 NOTE — Progress Notes (Signed)
Daily Session Note  Patient Details  Name: MANSEL STROTHER MRN: [id number] Date of Birth: 30-Dec-1955 Referring Provider:     Pulmonary Rehab from 10/05/2019 in [facility] Cardiac and Pulmonary Rehab  Referring Provider  [name] MD      Encounter Date: 01/31/2020  Check In: Session Check In - 01/31/20 0942      Check-In   Supervising physician immediately available to respond to emergencies  See telemetry face sheet for immediately available ER MD    Location  [facility]-Cardiac & Pulmonary Rehab    Staff Present  Heath Lark, RN, BSN, Laveda Norman, BS, ACSM CEP, Exercise Physiologist;Joseph Tessie Fass RCP,RRT,BSRT    Virtual Visit  No    Medication changes reported      No    Fall or balance concerns reported     No    Warm-up and Cool-down  Performed on first and last piece of equipment    Resistance Training Performed  Yes    VAD Patient?  No    PAD/SET Patient?  No      Pain Assessment   Currently in Pain?  No/denies          Social History   Tobacco Use  Smoking Status Former Smoker  . Packs/day: 0.25  . Years: 35.00  . Pack years: 8.75  . Types: Cigarettes  . Quit date: 10/22/1995  . Years since quitting: 24.2  Smokeless Tobacco Current User  . Types: Chew  Tobacco Comment   rarely smoked cigarettes and cigars.  states he smoked drugs mostly    Goals Met:  Proper associated with RPD/PD & O2 Sat Independence with exercise equipment Exercise tolerated well No report of cardiac concerns or symptoms  Goals Unmet:  Not Applicable  Comments: Pt able to follow exercise prescription today without complaint.  Will continue to monitor for progression.    Dr. Emily Filbert is Medical Director for [place] and [facility].

## 2020-02-02 ENCOUNTER — Encounter: Payer: Medicare Other | Admitting: *Deleted

## 2020-02-02 ENCOUNTER — Other Ambulatory Visit: Payer: Self-pay

## 2020-02-02 DIAGNOSIS — J449 Chronic obstructive pulmonary disease, unspecified: Secondary | ICD-10-CM | POA: Diagnosis not present

## 2020-02-02 DIAGNOSIS — I10 Essential (primary) hypertension: Secondary | ICD-10-CM | POA: Diagnosis not present

## 2020-02-02 DIAGNOSIS — J4550 Severe persistent asthma, uncomplicated: Secondary | ICD-10-CM

## 2020-02-02 DIAGNOSIS — E7800 Pure hypercholesterolemia, unspecified: Secondary | ICD-10-CM | POA: Diagnosis not present

## 2020-02-02 DIAGNOSIS — Z79899 Other long term (current) drug therapy: Secondary | ICD-10-CM | POA: Diagnosis not present

## 2020-02-02 DIAGNOSIS — G4730 Sleep apnea, unspecified: Secondary | ICD-10-CM | POA: Diagnosis not present

## 2020-02-02 NOTE — Progress Notes (Signed)
Daily Session Note  Patient Details  Name: Cody Day MRN: [id number] Date of Birth: 1956-05-16 Referring Provider:     Pulmonary Rehab from 10/05/2019 in [facility] Cardiac and Pulmonary Rehab  Referring Provider  [name] MD      Encounter Date: 02/02/2020  Check In: Session Check In - 02/02/20 0814      Check-In   Supervising physician immediately available to respond to emergencies  See telemetry face sheet for immediately available ER MD    Location  [facility]-Cardiac & Pulmonary Rehab    Staff Present  Heath Lark, RN, BSN, CCRP;Amanda Sommer, BA, ACSM CEP, Exercise Physiologist;Joseph Hood RCP,RRT,BSRT    Virtual Visit  No    Medication changes reported      No    Fall or balance concerns reported     No    Warm-up and Cool-down  Performed on first and last piece of equipment    Resistance Training Performed  Yes    VAD Patient?  No    PAD/SET Patient?  No      Pain Assessment   Currently in Pain?  No/denies          Social History   Tobacco Use  Smoking Status Former Smoker  . Packs/day: 0.25  . Years: 35.00  . Pack years: 8.75  . Types: Cigarettes  . Quit date: 10/22/1995  . Years since quitting: 24.2  Smokeless Tobacco Current User  . Types: Chew  Tobacco Comment   rarely smoked cigarettes and cigars.  states he smoked drugs mostly    Goals Met:  Proper associated with RPD/PD & O2 Sat Independence with exercise equipment Exercise tolerated well No report of cardiac concerns or symptoms  Goals Unmet:  Not Applicable  Comments: Pt able to follow exercise prescription today without complaint.  Will continue to monitor for progression.    Dr. Emily Filbert is Medical Director for [place] and [facility].

## 2020-02-03 ENCOUNTER — Encounter: Payer: Self-pay | Admitting: Cardiovascular Disease

## 2020-02-03 ENCOUNTER — Ambulatory Visit (INDEPENDENT_AMBULATORY_CARE_PROVIDER_SITE_OTHER): Payer: Medicare Other | Admitting: Cardiovascular Disease

## 2020-02-03 ENCOUNTER — Other Ambulatory Visit: Payer: Self-pay

## 2020-02-03 ENCOUNTER — Ambulatory Visit (INDEPENDENT_AMBULATORY_CARE_PROVIDER_SITE_OTHER): Payer: Medicare Other

## 2020-02-03 VITALS — BP 132/82 | HR 77 | Ht 71.0 in | Wt 248.0 lb

## 2020-02-03 VITALS — BP 132/82 | HR 77 | Ht 71.0 in | Wt 148.0 lb

## 2020-02-03 DIAGNOSIS — I2510 Atherosclerotic heart disease of native coronary artery without angina pectoris: Secondary | ICD-10-CM | POA: Diagnosis not present

## 2020-02-03 DIAGNOSIS — R0602 Shortness of breath: Secondary | ICD-10-CM | POA: Diagnosis not present

## 2020-02-03 DIAGNOSIS — I10 Essential (primary) hypertension: Secondary | ICD-10-CM

## 2020-02-03 DIAGNOSIS — Z0000 Encounter for general adult medical examination without abnormal findings: Secondary | ICD-10-CM | POA: Diagnosis not present

## 2020-02-03 NOTE — Progress Notes (Signed)
Subjective:   Cody Day is a 64 y.o. male who presents for an Initial Medicare Annual Wellness Visit.  Review of Systems: N/A    This visit is being conducted through telemedicine via telephone at the nurse health advisor's home address due to the COVID-19 pandemic. This patient has given me verbal consent via doximity to conduct this visit, patient states they are participating from their home address. Patient and myself are on the telephone call. There is no referral for this visit. Some vital signs may be absent or patient reported.    Patient identification: identified by name, DOB, and current address   Cardiac Risk Factors include: advanced age (>40men, >67 women);hypertension;dyslipidemia;male gender;smoking/ tobacco exposure(currently chews tobacco)    Objective:    Today's Vitals   02/03/20 1325  BP: 132/82  Pulse: 77  SpO2: 99%  Weight: 148 lb (67.1 kg)  Height: 5\' 11"  (1.803 m)   Body mass index is 20.64 kg/m.  Advanced Directives 02/03/2020 10/04/2019 10/01/2019 07/20/2018 12/12/2015  Does Patient Have a Medical Advance Directive? No No No No No  Would patient like information on creating a medical advance directive? No - Patient declined No - Patient declined No - Patient declined Yes (ED - Information included in AVS) -    Current Medications (verified) Outpatient Encounter Medications as of 02/03/2020  Medication Sig  . albuterol (VENTOLIN HFA) 108 (90 Base) MCG/ACT inhaler Inhale 2 puffs into the lungs every 4 (four) hours as needed for wheezing or shortness of breath.  . allopurinol (ZYLOPRIM) 300 MG tablet TAKE 1 TABLET BY MOUTH DAILY  . citalopram (CELEXA) 40 MG tablet Take 1 tablet (40 mg total) by mouth daily.  . fluticasone (FLOVENT HFA) 220 MCG/ACT inhaler Inhale 2 puffs into the lungs 2 (two) times daily.  Marland Kitchen lisinopril (ZESTRIL) 20 MG tablet TAKE 1 TABLET BY MOUTH DAILY  . montelukast (SINGULAIR) 10 MG tablet TAKE 1 TABLET BY MOUTH DAILY  . Multiple  Vitamin (MULTIVITAMIN WITH MINERALS) TABS tablet Take 1 tablet by mouth daily.  . simvastatin (ZOCOR) 20 MG tablet TAKE 1 TABLET BY MOUTH DAILY  . STIOLTO RESPIMAT 2.5-2.5 MCG/ACT AERS USE 2 INHALATIONS EVERY DAY  . testosterone cypionate (DEPOTESTOSTERONE CYPIONATE) 200 MG/ML injection INJECT 0.3 ML (60 MG TOTAL) INTO THE MUSCLE ONCE A WEEK   No facility-administered encounter medications on file as of 02/03/2020.    Allergies (verified) Levaquin [levofloxacin in d5w] and Penicillins   History: Past Medical History:  Diagnosis Date  . Achilles tendon rupture 10/11   and repair  . Adenomatous polyp of colon 04/06/2010   Adenomatous polyps 3 in the descending colon, times one in the ascending colon [facility])  . Asthma   . COPD (chronic obstructive pulmonary disease) ([facility])   . Gout   . Hemorrhoids   . Hypercholesteremia   . Hypertension   . Sleep apnea    doesn't wear CPAP  . Syncope 10/11   in settin gof asthma exacerbation w coughing. Ech (10/11): EF > 55%, mild LVH, grade I diastolic dysfunction, nomral RV size and systolic function,. normal valves. Carotid US (10/11): minimal disease   Past Surgical History:  Procedure Laterality Date  . ABDOMINAL HERNIA REPAIR  1998,2000  . calcium kidney stones    . [facility], [name], [place]; Dr. [name]. Multiple adenomatous polyps (total 4).   . COLONOSCOPY WITH PROPOFOL N/A 08/30/2015  Procedure: COLONOSCOPY WITH PROPOFOL;  Surgeon: [name], MD;  Location: [facility] ENDOSCOPY;  Service: Endoscopy;  Laterality: N/A;  . FEMORAL HERNIA REPAIR  2007  . HEMORRHOID SURGERY    . HERNIA REPAIR  09/10/2010   Repair of recurrent ventral hernia, resection of previously placed mesh x2, implantation 7.5 x 10 cm Gore-Tex dual mesh in an underlay position, repair of epigastric hernia with a 4.2 cm Proceed ventral patch.  Marland Kitchen HERNIA REPAIR   02//28/2007    Laparoscopic right inguinal hernia repair with Surgipro mesh, Ventrilex patch at umbilicus  . HERNIA REPAIR  11/01/1990  .   Small oval Kugel patch placed in preperitoneal space, [name], MD  . HERNIA REPAIR  01/17/1997   Recurrent ventral hernia 15 x 19 Gore-Tex dual mesh placed laparoscopically with multiple lefft--sided ports, [name], MD  . HERNIA REPAIR  01/07/1995    Primary repair of ventral hernia. [name], MD  . intestinal blockage     as a child  . KIDNEY STONE extraction     unic acid stones  . left heart cath  2008   minimal luminal irregularities EF 55%  . RIGHT/LEFT HEART CATH AND CORONARY ANGIOGRAPHY N/A 01/13/2017   Procedure: Right/Left Heart Cath and Coronary Angiography;  Surgeon: [name], MD;  Location: [facility];  Service: Cardiovascular;  Laterality: N/A;  . treadmill stress test  2003   Family History  Problem Relation Age of Onset  . Hypertension Father   . Cataracts Father   . Diabetes Father   . Heart attack Mother 40  . Coronary artery disease Mother   . Dementia Mother   . Hypertension Sister   . Depression Sister   . Hypertension Brother   . Colon cancer Brother   . Diabetes Other        1st degree relative  . Colon cancer Maternal Aunt    Social History   Socioeconomic History  . Marital status: Married    Spouse name: Not on file  . Number of children: Y  . Years of education: Not on file  . Highest education level: Not on file  Occupational History  . Occupation: Corporate treasurer: [employer]  Tobacco Use  . Smoking status: Former Smoker    Packs/day: 0.25    Years: 35.00    Pack years: 8.75    Types: Cigarettes    Quit date: 10/22/1995    Years since quitting: 24.3  . Smokeless tobacco: Current User    Types: Chew  . Tobacco comment: rarely smoked cigarettes and cigars.  states he smoked drugs mostly  Substance and Sexual Activity  . Alcohol use: Not Currently    Alcohol/week: 0.0  standard drinks    Comment: used to use cocaine and methamphetamine  . Drug use: No    Types: Marijuana, Methamphetamines    Comment: former smokerx 27 years  . Sexual activity: Yes    Birth control/protection: None  Other Topics Concern  . Not on file  Social History Narrative   Poor diet, lots of fats. Does not regularly exercise. Married. Originally from [place].    Social Determinants of Health   Financial Resource Strain: Low Risk   . Difficulty of Paying Living Expenses: Not hard at all  Food Insecurity: No Food Insecurity  . Worried About Charity fundraiser in the Last Year: Never true  . Ran Out of Food in the Last Year: Never true  Transportation Needs: No Transportation Needs  . Lack of Transportation (Medical): No  . Lack of Transportation (Non-Medical): No  Physical Activity: Sufficiently Active  . Days of Exercise per Week: 3 days  . Minutes of Exercise per Session: 60 min  Stress: Stress Concern Present  . Feeling of Stress : To some extent  Social Connections:   . Frequency of Communication with Friends and Family:   . Frequency of Social Gatherings with Friends and Family:   . Attends Religious Services:   . Active Member of Clubs or Organizations:   . Attends Archivist Meetings:   Marland Kitchen Marital Status:    Tobacco Counseling Ready to quit: Not Answered Counseling given: Not Answered Comment: rarely smoked cigarettes and cigars.  states he smoked drugs mostly   Clinical Intake:  Pre-visit preparation completed: Yes  Pain : No/denies pain     Nutritional Risks: None Diabetes: No  How often do you need to have someone help you when you read instructions, pamphlets, or other written materials from your doctor or pharmacy?: 1 - Never What is the last grade level you completed in school?: 12th  Interpreter Needed?: No  Information entered by :: CJohnson, LPN  Activities of Daily Living In your present state of health, do you have any  difficulty performing the following activities: 02/03/2020  Hearing? N  Vision? N  Difficulty concentrating or making decisions? N  Walking or climbing stairs? N  Dressing or bathing? N  Doing errands, shopping? N  Preparing Food and eating ? N  Using the Toilet? N  In the past six months, have you accidently leaked urine? N  Do you have problems with loss of bowel control? N  Managing your Medications? N  Managing your Finances? N  Housekeeping or managing your Housekeeping? N  Some recent data might be hidden     Immunizations and Health Maintenance Immunization History  Administered Date(s) Administered  . Influenza Whole 11/21/2012  . Influenza,inj,Quad PF,6+ Mos 09/06/2014, 07/18/2015, 07/29/2016, 09/19/2017, 07/21/2018, 07/29/2019  . Pneumococcal Polysaccharide-23 10/23/2012  . Td 10/22/2003, 10/21/2009   There are no preventive care reminders to display for this patient.  Patient Care Team: [name], MD as PCP - General Bary Castilla, Forest Gleason, MD (General Surgery)  Indicate any recent Medical Services you may have received from other than [facility] providers in the past year (date may be approximate).    Assessment:   This is a routine wellness examination for Narinder.  Hearing/Vision screen  Hearing Screening   125Hz  250Hz  500Hz  1000Hz  2000Hz  3000Hz  4000Hz  6000Hz  8000Hz   Right ear:           Left ear:           Vision Screening Comments: Patient gets annual eye exams   Dietary issues and exercise activities discussed: Current Exercise Habits: Structured exercise class(cardiopulmonary rehab), Type of exercise: Other - see comments(cardiopulmonary rehab), Time (Minutes): 60, Frequency (Times/Week): 3, Weekly Exercise (Minutes/Week): 180, Intensity: Moderate, Exercise limited by: None identified  Goals    . Patient Stated     02/03/2020, I will continue cardio pulmonary rehab 3 days a week for 1 hour.      Depression Screen PHQ 2/9 Scores 02/03/2020 01/17/2020  10/05/2019 02/18/2019  PHQ - 2 Score 0 2 4 0  PHQ- 9 Score 0 11 11 -    Fall Risk Fall Risk  02/03/2020 10/04/2019 02/18/2019 02/18/2019 11/05/2016  Falls in the past year? 0 0 1 0 No  Number falls  in past yr: 0 0 0 - -  Injury with Fall? 0 0 0 - -  Risk for fall due to : Medication side effect - - - -  Follow up Falls evaluation completed;Falls prevention discussed Falls evaluation completed;Education provided;Falls prevention discussed - - -    Is the patient's home free of loose throw rugs in walkways, pet beds, electrical cords, etc?   yes      Grab bars in the bathroom? yes      Handrails on the stairs?   yes      Adequate lighting?   yes  Timed Get Up and Go performed: N/A  Cognitive Function: MMSE - Mini Mental State Exam 02/03/2020  Orientation to time 5  Orientation to Place 5  Registration 3  Attention/ Calculation 5  Recall 1  Language- repeat 1   Montreal Cognitive Assessment  09/26/2016 07/28/2014  Visuospatial/ Executive (0/5) 4 5  Naming (0/3) 3 3  Attention: Read list of digits (0/2) 2 2  Attention: Read list of letters (0/1) 1 1  Attention: Serial 7 subtraction starting at 100 (0/3) 3 3  Language: Repeat phrase (0/2) 2 2  Language : Fluency (0/1) 1 0  Abstraction (0/2) 2 2  Delayed Recall (0/5) 3 5  Orientation (0/6) 6 6  Total 27 29  Adjusted Score (based on education) - 29     Mini Cog  Mini-Cog screen was completed. Maximum score is 22. A value of 0 denotes this part of the MMSE was not completed or the patient failed this part of the Mini-Cog screening.  Screening Tests Health Maintenance  Topic Date Due  . TETANUS/TDAP  11/15/2020 (Originally 10/22/2019)  . INFLUENZA VACCINE  05/21/2020  . COLONOSCOPY  08/29/2025  . Hepatitis C Screening  Completed  . HIV Screening  Completed    Qualifies for Shingles Vaccine: Yes  Cancer Screenings: Lung: Low Dose CT Chest recommended if Age 12-80 years, 30 pack-year currently smoking OR have quit w/in 15  years. Patient does not qualify. Colorectal: completed 08/30/2015  Additional Screenings:  Hepatitis C Screening: 11/19/2010      Plan:    Patient will continue cardio pulmonary rehab 3 days a week for 1 hour.  I have personally reviewed and noted the following in the patient's chart:   . Medical and social history . Use of alcohol, tobacco or illicit drugs  . Current medications and supplements . Functional ability and status . Nutritional status . Physical activity . Advanced directives . List of other physicians . Hospitalizations, surgeries, and ER visits in previous 12 months . Vitals . Screenings to include cognitive, depression, and falls . Referrals and appointments  In addition, I have reviewed and discussed with patient certain preventive protocols, quality metrics, and best practice recommendations. A written personalized care plan for preventive services as well as general preventive health recommendations were provided to patient.     Andrez Grime, LPN   [phone]

## 2020-02-03 NOTE — Patient Instructions (Signed)
[name] , Thank you for taking time to come for your Medicare Wellness Visit. I appreciate your ongoing commitment to your health goals. Please review the following plan we discussed and let me know if I can assist you in the future.   Screening recommendations/referrals: Colonoscopy: Up to date, completed 08/30/2015 Recommended yearly ophthalmology/optometry visit for glaucoma screening and checkup Recommended yearly dental visit for hygiene and checkup  Vaccinations: Influenza vaccine: Up to date, completed 07/29/2019 Pneumococcal vaccine: age 47 Tdap vaccine: decline Shingles vaccine: discussed    Advanced directives: Advance directive discussed with you today. Even though you declined this today please call our office should you change your mind and we can give you the proper paperwork for you to fill out.  Conditions/risks identified: hypertension, hyperlipidemia  Next appointment: 02/29/2020 @ 10 am   Preventive Care 40-64 Years, Male Preventive care refers to lifestyle choices and visits with your health care provider that can promote health and wellness. What does preventive care include?  A yearly physical exam. This is also called an annual well check.  Dental exams once or twice a year.  Routine eye exams. Ask your health care provider how often you should have your eyes checked.  Personal lifestyle choices, including:  Daily care of your teeth and gums.  Regular physical activity.  Eating a healthy diet.  Avoiding tobacco and drug use.  Limiting alcohol use.  Practicing safe sex.  Taking low-dose aspirin every day starting at age 28. What happens during an annual well check? The services and screenings done by your health care provider during your annual well check will depend on your age, overall health, lifestyle risk factors, and family history of disease. Counseling  Your health care provider may ask you questions about your:  Alcohol use.  Tobacco  use.  Drug use.  Emotional well-being.  Home and relationship well-being.  Sexual activity.  Eating habits.  Work and work Statistician. Screening  You may have the following tests or measurements:  Height, weight, and BMI.  Blood pressure.  Lipid and cholesterol levels. These may be checked every 5 years, or more frequently if you are over 66 years old.  Skin check.  Lung cancer screening. You may have this screening every year starting at age 58 if you have a 30-pack-year history of smoking and currently smoke or have quit within the past 15 years.  Fecal occult blood test (FOBT) of the stool. You may have this test every year starting at age 27.  Flexible sigmoidoscopy or colonoscopy. You may have a sigmoidoscopy every 5 years or a colonoscopy every 10 years starting at age 65.  Prostate cancer screening. Recommendations will vary depending on your family history and other risks.  Hepatitis C blood test.  Hepatitis B blood test.  Sexually transmitted disease (STD) testing.  Diabetes screening. This is done by checking your blood sugar (glucose) after you have not eaten for a while (fasting). You may have this done every 1-3 years. Discuss your test results, treatment options, and if necessary, the need for more tests with your health care provider. Vaccines  Your health care provider may recommend certain vaccines, such as:  Influenza vaccine. This is recommended every year.  Tetanus, diphtheria, and acellular pertussis (Tdap, Td) vaccine. You may need a Td booster every 10 years.  Zoster vaccine. You may need this after age 78.  Pneumococcal 13-valent conjugate (PCV13) vaccine. You may need this if you have certain conditions and have not been vaccinated.  Pneumococcal polysaccharide (PPSV23) vaccine. You may need one or two doses if you smoke cigarettes or if you have certain conditions. Talk to your health care provider about which screenings and vaccines you  need and how often you need them. This information is not intended to replace advice given to you by your health care provider. Make sure you discuss any questions you have with your health care provider. Document Released: 11/03/2015 Document Revised: 06/26/2016 Document Reviewed: 08/08/2015 Elsevier Interactive Patient Education  2017 [place] Prevention in the Home Falls can cause injuries. They can happen to people of all ages. There are many things you can do to make your home safe and to help prevent falls. What can I do on the outside of my home?  Regularly fix the edges of walkways and driveways and fix any cracks.  Remove anything that might make you trip as you walk through a door, such as a raised step or threshold.  Trim any bushes or trees on the path to your home.  Use bright outdoor lighting.  Clear any walking paths of anything that might make someone trip, such as rocks or tools.  Regularly check to see if handrails are loose or broken. Make sure that both sides of any steps have handrails.  Any raised decks and porches should have guardrails on the edges.  Have any leaves, snow, or ice cleared regularly.  Use sand or salt on walking paths during winter.  Clean up any spills in your garage right away. This includes oil or grease spills. What can I do in the bathroom?  Use night lights.  Install grab bars by the toilet and in the tub and shower. Do not use towel bars as grab bars.  Use non-skid mats or decals in the tub or shower.  If you need to sit down in the shower, use a plastic, non-slip stool.  Keep the floor dry. Clean up any water that spills on the floor as soon as it happens.  Remove soap buildup in the tub or shower regularly.  Attach bath mats securely with double-sided non-slip rug tape.  Do not have throw rugs and other things on the floor that can make you trip. What can I do in the bedroom?  Use night lights.  Make sure  that you have a light by your bed that is easy to reach.  Do not use any sheets or blankets that are too big for your bed. They should not hang down onto the floor.  Have a firm chair that has side arms. You can use this for support while you get dressed.  Do not have throw rugs and other things on the floor that can make you trip. What can I do in the kitchen?  Clean up any spills right away.  Avoid walking on wet floors.  Keep items that you use a lot in easy-to-reach places.  If you need to reach something above you, use a strong step stool that has a grab bar.  Keep electrical cords out of the way.  Do not use floor polish or wax that makes floors slippery. If you must use wax, use non-skid floor wax.  Do not have throw rugs and other things on the floor that can make you trip. What can I do with my stairs?  Do not leave any items on the stairs.  Make sure that there are handrails on both sides of the stairs and use them. Fix handrails that  are broken or loose. Make sure that handrails are as long as the stairways.  Check any carpeting to make sure that it is firmly attached to the stairs. Fix any carpet that is loose or worn.  Avoid having throw rugs at the top or bottom of the stairs. If you do have throw rugs, attach them to the floor with carpet tape.  Make sure that you have a light switch at the top of the stairs and the bottom of the stairs. If you do not have them, ask someone to add them for you. What else can I do to help prevent falls?  Wear shoes that:  Do not have high heels.  Have rubber bottoms.  Are comfortable and fit you well.  Are closed at the toe. Do not wear sandals.  If you use a stepladder:  Make sure that it is fully opened. Do not climb a closed stepladder.  Make sure that both sides of the stepladder are locked into place.  Ask someone to hold it for you, if possible.  Clearly mark and make sure that you can see:  Any grab bars or  handrails.  First and last steps.  Where the edge of each step is.  Use tools that help you move around (mobility aids) if they are needed. These include:  Canes.  Walkers.  Scooters.  Crutches.  Turn on the lights when you go into a dark area. Replace any light bulbs as soon as they burn out.  Set up your furniture so you have a clear path. Avoid moving your furniture around.  If any of your floors are uneven, fix them.  If there are any pets around you, be aware of where they are.  Review your medicines with your doctor. Some medicines can make you feel dizzy. This can increase your chance of falling. Ask your doctor what other things that you can do to help prevent falls. This information is not intended to replace advice given to you by your health care provider. Make sure you discuss any questions you have with your health care provider. Document Released: 08/03/2009 Document Revised: 03/14/2016 Document Reviewed: 11/11/2014 Elsevier Interactive Patient Education  2017 [employer].

## 2020-02-03 NOTE — Progress Notes (Signed)
Cardiology Office Note   Date:  02/03/2020   ID:  Cody Day, DOB 1955/12/09, MRN [id number]  PCP:  [name], MD  Cardiologist:   Kathlyn Sacramento, MD   Chief Complaint  Patient presents with  . other    Dilated Cardiomyopathy per Dr Diona Browner      History of Present Illness: Cody Day is a 64 y.o. male who was referred by Dr. Diona Browner for evaluation of shortness of breath and left ventricular hypertrophy noted on echo. The patient has known history of asthma/COPD, anxiety, sleep apnea not on CPAP, obesity, essential hypertension, hyperlipidemia and previous tobacco and drug use.  He reports inhaling different kind of drugs that might have affected his lungs. He was seen by me in 2018 for exertional dyspnea and possible cardiomyopathy.  I proceeded with a right and left cardiac catheterization in March 2018 which showed mild nonobstructive coronary artery disease, normal LV systolic function and normal right heart catheterization. He underwent cardiac catheterization in 2008 which showed minor luminal irregularities with ejection fraction of 55%.  The patient continues to struggle with shortness of breath related to his lung disease and is currently followed by Dr. Mortimer Fries.  He is attending pulmonary rehab. He had an echocardiogram done in December 2020 which showed an EF of 50 to 55% with moderate left ventricular hypertrophy no significant valvular abnormalities.  The study was overall suboptimal due to poor windows.  Definity was not used.  I personally reviewed the study and could not see the cardiac borders very well.  There might be some reduced ejection fraction.  Left ventricular hypertrophy was mild with septal thickness of 1.2 cm. He denies chest pain. The patient was diagnosed with COVID-19 in March and received infusion in [place].  He recovered.  Past Medical History:  Diagnosis Date  . Achilles tendon rupture 10/11   and repair  . Adenomatous polyp of colon  04/06/2010   Adenomatous polyps 3 in the descending colon, times one in the ascending colon [facility])  . Asthma   . COPD (chronic obstructive pulmonary disease) ([place])   . Gout   . Hemorrhoids   . Hypercholesteremia   . Hypertension   . Sleep apnea    doesn't wear CPAP  . Syncope 10/11   in settin gof asthma exacerbation w coughing. Ech (10/11): EF > 55%, mild LVH, grade I diastolic dysfunction, nomral RV size and systolic function,. normal valves. Carotid US (10/11): minimal disease    Past Surgical History:  Procedure Laterality Date  . ABDOMINAL HERNIA REPAIR  1998,2000  . calcium kidney stones    . [facility], [name], [place]; Dr. [name]. Multiple adenomatous polyps (total 4).   . COLONOSCOPY WITH PROPOFOL N/A 08/30/2015   Procedure: COLONOSCOPY WITH PROPOFOL;  Surgeon: [name], MD;  Location: [facility] ENDOSCOPY;  Service: Endoscopy;  Laterality: N/A;  . FEMORAL HERNIA REPAIR  2007  . HEMORRHOID SURGERY    . HERNIA REPAIR  09/10/2010   Repair of recurrent ventral hernia, resection of previously placed mesh x2, implantation 7.5 x 10 cm Gore-Tex dual mesh in an underlay position, repair of epigastric hernia with a 4.2 cm Proceed ventral patch.  Marland Kitchen HERNIA REPAIR   02//28/2007   Laparoscopic right inguinal hernia repair with Surgipro mesh, Ventrilex patch at umbilicus  . HERNIA REPAIR  11/01/1990  .   Small oval Kugel patch  placed in preperitoneal space, [name], MD  . HERNIA REPAIR  01/17/1997   Recurrent ventral hernia 15 x 19 Gore-Tex dual mesh placed laparoscopically with multiple lefft--sided ports, [name], MD  . HERNIA REPAIR  01/07/1995    Primary repair of ventral hernia. [name], MD  . intestinal blockage     as a child  . KIDNEY STONE extraction     unic acid stones  . left heart cath  2008   minimal luminal irregularities EF 55%  . RIGHT/LEFT HEART CATH AND CORONARY  ANGIOGRAPHY N/A 01/13/2017   Procedure: Right/Left Heart Cath and Coronary Angiography;  Surgeon: [name], MD;  Location: [facility];  Service: Cardiovascular;  Laterality: N/A;  . treadmill stress test  2003     Current Outpatient Medications  Medication Sig Dispense Refill  . albuterol (VENTOLIN HFA) 108 (90 Base) MCG/ACT inhaler Inhale 2 puffs into the lungs every 4 (four) hours as needed for wheezing or shortness of breath. 1 Inhaler 10  . allopurinol (ZYLOPRIM) 300 MG tablet TAKE 1 TABLET BY MOUTH DAILY 90 tablet 0  . citalopram (CELEXA) 40 MG tablet Take 1 tablet (40 mg total) by mouth daily. 90 tablet 1  . fluticasone (FLOVENT HFA) 220 MCG/ACT inhaler Inhale 2 puffs into the lungs 2 (two) times daily. 1 Inhaler 5  . lisinopril (ZESTRIL) 20 MG tablet TAKE 1 TABLET BY MOUTH DAILY 90 tablet 0  . montelukast (SINGULAIR) 10 MG tablet TAKE 1 TABLET BY MOUTH DAILY 90 tablet 0  . Multiple Vitamin (MULTIVITAMIN WITH MINERALS) TABS tablet Take 1 tablet by mouth daily. 30 tablet 2  . simvastatin (ZOCOR) 20 MG tablet TAKE 1 TABLET BY MOUTH DAILY 90 tablet 3  . STIOLTO RESPIMAT 2.5-2.5 MCG/ACT AERS USE 2 INHALATIONS EVERY DAY 4 g 5  . testosterone cypionate (DEPOTESTOSTERONE CYPIONATE) 200 MG/ML injection INJECT 0.3 ML (60 MG TOTAL) INTO THE MUSCLE ONCE A WEEK 10 mL 0   No current facility-administered medications for this visit.    Allergies:   Levaquin [levofloxacin in d5w] and Penicillins    Social History:  The patient  reports that he quit smoking about 24 years ago. His smoking use included cigarettes. He has a 8.75 pack-year smoking history. His smokeless tobacco use includes chew. He reports current alcohol use. He reports that he does not use drugs.   Family History:  The patient's family history includes Cataracts in his father; Colon cancer in his brother and maternal aunt; Coronary artery disease in his mother; Dementia in his mother; Depression in his sister;  Diabetes in his father and another family member; Heart attack (age of onset: 42) in his mother; Hypertension in his brother, father, and sister.    ROS:  Please see the history of present illness.   Otherwise, review of systems are positive for none.   All other systems are reviewed and negative.    PHYSICAL EXAM: VS:  BP 132/82   Pulse 77   Ht 5\' 11"  (1.803 m)   Wt 248 lb (112.5 kg)   SpO2 99%   BMI 34.59 kg/m  , BMI Body mass index is 34.59 kg/m. GEN: Well nourished, well developed, in no acute distress  HEENT: normal  Neck: no JVD, carotid bruits, or masses Cardiac: RRR; no murmurs, rubs, or gallops,no edema  Respiratory:  clear to auscultation bilaterally with mildly diminished breath sounds GI: soft, nontender, nondistended, + BS MS: no deformity or atrophy  Skin: warm and dry, no  rash Neuro:  Strength and sensation are intact Psych: euthymic mood, full affect   EKG:  EKG is ordered today. The ekg ordered today demonstrates normal sinus rhythm with LVH.   Recent Labs: 10/04/2019: B Natriuretic Peptide 34.0 11/03/2019: ALT 21; BUN 13; Creatinine, Ser 0.75; Hemoglobin 13.3; Platelets 369; Potassium 3.7; Sodium 133    Lipid Panel    Component Value Date/Time   CHOL 123 08/30/2019 0758   TRIG 76.0 08/30/2019 0758   HDL 51.00 08/30/2019 0758   CHOLHDL 2 08/30/2019 0758   VLDL 15.2 08/30/2019 0758   LDLCALC 57 08/30/2019 0758   LDLDIRECT 53.0 08/23/2016 0857      Wt Readings from Last 3 Encounters:  02/03/20 248 lb (112.5 kg)  12/13/19 253 lb 6.4 oz (114.9 kg)  12/13/19 253 lb 6.4 oz (114.9 kg)        PAD Screen 02/03/2020  Previous PAD dx? No  Previous surgical procedure? No  Pain with walking? No  Feet/toe relief with dangling? No  Painful, non-healing ulcers? No  Extremities discolored? No      ASSESSMENT AND PLAN:  1.  Shortness of breath : I suspect that this is mostly related to his underlying lung disease.  Previous right and left cardiac  catheterization in 2018 showed no obstructive coronary artery disease with normal ejection fraction and normal right heart catheterization. Recent echocardiogram was reviewed by me and his left ventricular hypertrophy appears to be mild and likely explainable by his essential hypertension and obesity.  Nonetheless, on the recent echo, I could not determine with certainty if his ejection fraction was mildly reduced or if there were any wall motion abnormalities given poor windows.  I am going to repeat echo with contrast for better evaluation of this.  If ejection fraction is normal, no further cardiac work-up is needed.  2.  Essential hypertension: Blood pressure is reasonably controlled.    Disposition:   FU with me as needed if echocardiogram is abnormal.  Signed,  Kathlyn Sacramento, MD  02/03/2020 9:07 AM    [place]

## 2020-02-03 NOTE — Patient Instructions (Signed)
Medication Instructions:  Your physician recommends that you continue on your current medications as directed. Please refer to the Current Medication list given to you today.  *If you need a refill on your cardiac medications before your next appointment, please call your pharmacy*   Lab Work: None ordered If you have labs (blood work) drawn today and your tests are completely normal, you will receive your results only by: Marland Kitchen MyChart Message (if you have MyChart) OR . A paper copy in the mail If you have any lab test that is abnormal or we need to change your treatment, we will call you to review the results.   Testing/Procedures: Your physician has requested that you have an echocardiogram. Echocardiography is a painless test that uses sound waves to create images of your heart. It provides your doctor with information about the size and shape of your heart and how well your heart's chambers and valves are working. This procedure takes approximately one hour. There are no restrictions for this procedure.     Follow-Up: At [facility], you and your health needs are our priority.  As part of our continuing mission to provide you with exceptional heart care, we have created designated Provider Care Teams.  These Care Teams include your primary Cardiologist (physician) and Advanced Practice Providers (APPs -  Physician Assistants and Nurse Practitioners) who all work together to provide you with the care you need, when you need it.  We recommend signing up for the patient portal called "MyChart".  Sign up information is provided on this After Visit Summary.  MyChart is used to connect with patients for Virtual Visits (Telemedicine).  Patients are able to view lab/test results, encounter notes, upcoming appointments, etc.  Non-urgent messages can be sent to your provider as well.   To learn more about what you can do with MyChart, go to [email or web address].    Your next appointment:   As  needed   The format for your next appointment:   In Person  Provider:    You may see  Dr. Fletcher Anon or one of the following Advanced Practice Providers on your designated Care Team:    Murray Hodgkins, NP  Christell Faith, PA-C  Marrianne Mood, PA-C    Other Instructions  Echocardiogram An echocardiogram is a procedure that uses painless sound waves (ultrasound) to produce an image of the heart. Images from an echocardiogram can provide important information about:  Signs of coronary artery disease (CAD).  Aneurysm detection. An aneurysm is a weak or damaged part of an artery wall that bulges out from the normal force of blood pumping through the body.  Heart size and shape. Changes in the size or shape of the heart can be associated with certain conditions, including heart failure, aneurysm, and CAD.  Heart muscle function.  Heart valve function.  Signs of a past heart attack.  Fluid buildup around the heart.  Thickening of the heart muscle.  A tumor or infectious growth around the heart valves. Tell a health care provider about:  Any allergies you have.  All medicines you are taking, including vitamins, herbs, eye drops, creams, and over-the-counter medicines.  Any blood disorders you have.  Any surgeries you have had.  Any medical conditions you have.  Whether you are pregnant or may be pregnant. What are the risks? Generally, this is a safe procedure. However, problems may occur, including:  Allergic reaction to dye (contrast) that may be used during the procedure. What happens before the procedure?  No specific preparation is needed. You may eat and drink normally. What happens during the procedure?   An IV tube may be inserted into one of your veins.  You may receive contrast through this tube. A contrast is an injection that improves the quality of the pictures from your heart.  A gel will be applied to your chest.  A wand-like tool (transducer) will  be moved over your chest. The gel will help to transmit the sound waves from the transducer.  The sound waves will harmlessly bounce off of your heart to allow the heart images to be captured in real-time motion. The images will be recorded on a computer. The procedure may vary among health care providers and hospitals. What happens after the procedure?  You may return to your normal, everyday life, including diet, activities, and medicines, unless your health care provider tells you not to do that. Summary  An echocardiogram is a procedure that uses painless sound waves (ultrasound) to produce an image of the heart.  Images from an echocardiogram can provide important information about the size and shape of your heart, heart muscle function, heart valve function, and fluid buildup around your heart.  You do not need to do anything to prepare before this procedure. You may eat and drink normally.  After the echocardiogram is completed, you may return to your normal, everyday life, unless your health care provider tells you not to do that. This information is not intended to replace advice given to you by your health care provider. Make sure you discuss any questions you have with your health care provider. Document Revised: 01/28/2019 Document Reviewed: 11/09/2016 Elsevier Patient Education  [place].

## 2020-02-03 NOTE — Progress Notes (Signed)
PCP notes:  Health Maintenance: Tdap- insurance/financial   Abnormal Screenings: none   Patient concerns: none   Nurse concerns: none   Next PCP appt: 02/29/2020 @ 10 am

## 2020-02-04 DIAGNOSIS — Z79899 Other long term (current) drug therapy: Secondary | ICD-10-CM | POA: Diagnosis not present

## 2020-02-04 DIAGNOSIS — E7800 Pure hypercholesterolemia, unspecified: Secondary | ICD-10-CM | POA: Diagnosis not present

## 2020-02-04 DIAGNOSIS — G4730 Sleep apnea, unspecified: Secondary | ICD-10-CM | POA: Diagnosis not present

## 2020-02-04 DIAGNOSIS — J4550 Severe persistent asthma, uncomplicated: Secondary | ICD-10-CM

## 2020-02-04 DIAGNOSIS — J449 Chronic obstructive pulmonary disease, unspecified: Secondary | ICD-10-CM | POA: Diagnosis not present

## 2020-02-04 DIAGNOSIS — I10 Essential (primary) hypertension: Secondary | ICD-10-CM | POA: Diagnosis not present

## 2020-02-04 NOTE — Progress Notes (Signed)
Daily Session Note  Patient Details  Name: Cody Day MRN: [id number] Date of Birth: Jan 01, 1956 Referring Provider:     Pulmonary Rehab from 10/05/2019 in [facility] Cardiac and Pulmonary Rehab  Referring Provider  [name] MD      Encounter Date: 02/04/2020  Check In: Session Check In - 02/04/20 0751      Check-In   Supervising physician immediately available to respond to emergencies  See telemetry face sheet for immediately available ER MD    Location  [facility]-Cardiac & Pulmonary Rehab    Staff Present  Vida Rigger RN, BSN;Jessica Luan Pulling, MA, RCEP, CCRP, CCET;Joseph Hood RCP,RRT,BSRT    Virtual Visit  No    Medication changes reported      No    Fall or balance concerns reported     No    Warm-up and Cool-down  Performed on first and last piece of equipment    Resistance Training Performed  Yes    VAD Patient?  No    PAD/SET Patient?  No      Pain Assessment   Currently in Pain?  No/denies          Social History   Tobacco Use  Smoking Status Former Smoker  . Packs/day: 0.25  . Years: 35.00  . Pack years: 8.75  . Types: Cigarettes  . Quit date: 10/22/1995  . Years since quitting: 24.3  Smokeless Tobacco Current User  . Types: Chew  Tobacco Comment   rarely smoked cigarettes and cigars.  states he smoked drugs mostly    Goals Met:  Proper associated with RPD/PD & O2 Sat Independence with exercise equipment Using PLB without cueing & demonstrates good technique Exercise tolerated well No report of cardiac concerns or symptoms Strength training completed today  Goals Unmet:  Not Applicable  Comments: Pt able to follow exercise prescription today without complaint.  Will continue to monitor for progression.   Dr. Emily Filbert is Medical Director for [place] and [facility].

## 2020-02-07 ENCOUNTER — Encounter: Payer: Medicare Other | Admitting: *Deleted

## 2020-02-07 ENCOUNTER — Other Ambulatory Visit: Payer: Self-pay

## 2020-02-07 DIAGNOSIS — J4550 Severe persistent asthma, uncomplicated: Secondary | ICD-10-CM | POA: Diagnosis not present

## 2020-02-07 DIAGNOSIS — Z79899 Other long term (current) drug therapy: Secondary | ICD-10-CM | POA: Diagnosis not present

## 2020-02-07 DIAGNOSIS — E7800 Pure hypercholesterolemia, unspecified: Secondary | ICD-10-CM | POA: Diagnosis not present

## 2020-02-07 DIAGNOSIS — I10 Essential (primary) hypertension: Secondary | ICD-10-CM | POA: Diagnosis not present

## 2020-02-07 DIAGNOSIS — G4730 Sleep apnea, unspecified: Secondary | ICD-10-CM | POA: Diagnosis not present

## 2020-02-07 DIAGNOSIS — J449 Chronic obstructive pulmonary disease, unspecified: Secondary | ICD-10-CM | POA: Diagnosis not present

## 2020-02-07 NOTE — Progress Notes (Signed)
Daily Session Note  Patient Details  Name: Cody Day MRN: [id number] Date of Birth: 10/14/1956 Referring Provider:     Pulmonary Rehab from 10/05/2019 in [facility] Cardiac and Pulmonary Rehab  Referring Provider  [name] MD      Encounter Date: 02/07/2020  Check In: Session Check In - 02/07/20 1720      Check-In   Supervising physician immediately available to respond to emergencies  See telemetry face sheet for immediately available ER MD    Location  [facility]-Cardiac & Pulmonary Rehab    Staff Present  Earlean Shawl, BS, ACSM CEP, Exercise Physiologist;Joseph Tessie Fass RCP,RRT,BSRT    Virtual Visit  No    Medication changes reported      No    Fall or balance concerns reported     No    Warm-up and Cool-down  Performed on first and last piece of equipment    Resistance Training Performed  Yes    VAD Patient?  No    PAD/SET Patient?  No      Pain Assessment   Currently in Pain?  No/denies          Social History   Tobacco Use  Smoking Status Former Smoker  . Packs/day: 0.25  . Years: 35.00  . Pack years: 8.75  . Types: Cigarettes  . Quit date: 10/22/1995  . Years since quitting: 24.3  Smokeless Tobacco Current User  . Types: Chew  Tobacco Comment   rarely smoked cigarettes and cigars.  states he smoked drugs mostly    Goals Met:  Proper associated with RPD/PD & O2 Sat Independence with exercise equipment Exercise tolerated well No report of cardiac concerns or symptoms  Goals Unmet:  Not Applicable  Comments: Pt able to follow exercise prescription today without complaint.  Will continue to monitor for progression.    Dr. Emily Filbert is Medical Director for [place] and [facility].

## 2020-02-09 ENCOUNTER — Encounter: Payer: Medicare Other | Admitting: *Deleted

## 2020-02-09 ENCOUNTER — Other Ambulatory Visit: Payer: Self-pay

## 2020-02-09 ENCOUNTER — Encounter: Payer: Self-pay | Admitting: *Deleted

## 2020-02-09 DIAGNOSIS — Z79899 Other long term (current) drug therapy: Secondary | ICD-10-CM | POA: Diagnosis not present

## 2020-02-09 DIAGNOSIS — G4730 Sleep apnea, unspecified: Secondary | ICD-10-CM | POA: Diagnosis not present

## 2020-02-09 DIAGNOSIS — I10 Essential (primary) hypertension: Secondary | ICD-10-CM | POA: Diagnosis not present

## 2020-02-09 DIAGNOSIS — J449 Chronic obstructive pulmonary disease, unspecified: Secondary | ICD-10-CM | POA: Diagnosis not present

## 2020-02-09 DIAGNOSIS — E7800 Pure hypercholesterolemia, unspecified: Secondary | ICD-10-CM | POA: Diagnosis not present

## 2020-02-09 DIAGNOSIS — J4550 Severe persistent asthma, uncomplicated: Secondary | ICD-10-CM | POA: Diagnosis not present

## 2020-02-09 NOTE — Progress Notes (Signed)
Daily Session Note  Patient Details  Name: Cody Day MRN: [id number] Date of Birth: [birth year] Referring Provider:     Pulmonary Rehab from 10/05/2019 in [facility] Cardiac and Pulmonary Rehab  Referring Provider  [name] MD      Encounter Date: 02/09/2020  Check In: Session Check In - 02/09/20 0819      Check-In   Supervising physician immediately available to respond to emergencies  See telemetry face sheet for immediately available ER MD    Location  [facility]-Cardiac & Pulmonary Rehab    Staff Present  Heath Lark, RN, BSN, CCRP;Jessica [place], MA, RCEP, CCRP, CCET;Joseph [employer], [place], ACSM CEP, Exercise Physiologist    Virtual Visit  No    Medication changes reported      No    Fall or balance concerns reported     No    Warm-up and Cool-down  Performed on first and last piece of equipment    Resistance Training Performed  Yes    VAD Patient?  No    PAD/SET Patient?  No      Pain Assessment   Currently in Pain?  No/denies          Social History   Tobacco Use  Smoking Status Former Smoker  . Packs/day: 0.25  . Years: 35.00  . Pack years: 8.75  . Types: Cigarettes  . Quit date: 10/22/1995  . Years since quitting: 24.3  Smokeless Tobacco Current User  . Types: Chew  Tobacco Comment   rarely smoked cigarettes and cigars.  states he smoked drugs mostly    Goals Met:  Proper associated with RPD/PD & O2 Sat Independence with exercise equipment Exercise tolerated well No report of cardiac concerns or symptoms  Goals Unmet:  Not Applicable  Comments: Pt able to follow exercise prescription today without complaint.  Will continue to monitor for progression.    Dr. Emily Filbert is Medical Director for [place] and [facility].

## 2020-02-09 NOTE — Progress Notes (Signed)
Pulmonary Individual Treatment Plan  Patient Details  Name: ROMIN DIVITA MRN: [id number] Date of Birth: 1956/07/31 Referring Provider:     Pulmonary Rehab from 10/05/2019 in [facility] Cardiac and Pulmonary Rehab  Referring Provider  [name] MD      Initial Encounter Date:    Pulmonary Rehab from 10/05/2019 in [facility] Cardiac and Pulmonary Rehab  Date  10/05/19      Visit Diagnosis: Severe persistent chronic asthma without complication  Patient's Home Medications on Admission:  Current Outpatient Medications:  .  albuterol (VENTOLIN HFA) 108 (90 Base) MCG/ACT inhaler, Inhale 2 puffs into the lungs every 4 (four) hours as needed for wheezing or shortness of breath., Disp: 1 Inhaler, Rfl: 10 .  allopurinol (ZYLOPRIM) 300 MG tablet, TAKE 1 TABLET BY MOUTH DAILY, Disp: 90 tablet, Rfl: 0 .  citalopram (CELEXA) 40 MG tablet, Take 1 tablet (40 mg total) by mouth daily., Disp: 90 tablet, Rfl: 1 .  fluticasone (FLOVENT HFA) 220 MCG/ACT inhaler, Inhale 2 puffs into the lungs 2 (two) times daily., Disp: 1 Inhaler, Rfl: 5 .  lisinopril (ZESTRIL) 20 MG tablet, TAKE 1 TABLET BY MOUTH DAILY, Disp: 90 tablet, Rfl: 0 .  montelukast (SINGULAIR) 10 MG tablet, TAKE 1 TABLET BY MOUTH DAILY, Disp: 90 tablet, Rfl: 0 .  Multiple Vitamin (MULTIVITAMIN WITH MINERALS) TABS tablet, Take 1 tablet by mouth daily., Disp: 30 tablet, Rfl: 2 .  simvastatin (ZOCOR) 20 MG tablet, TAKE 1 TABLET BY MOUTH DAILY, Disp: 90 tablet, Rfl: 3 .  STIOLTO RESPIMAT 2.5-2.5 MCG/ACT AERS, USE 2 INHALATIONS EVERY DAY, Disp: 4 g, Rfl: 5 .  testosterone cypionate (DEPOTESTOSTERONE CYPIONATE) 200 MG/ML injection, INJECT 0.3 ML (60 MG TOTAL) INTO THE MUSCLE ONCE A WEEK, Disp: 10 mL, Rfl: 0  Past Medical History: Past Medical History:  Diagnosis Date  . Achilles tendon rupture 10/11   and repair  . Adenomatous polyp of colon 04/06/2010   Adenomatous polyps 3 in the descending colon, times one in the ascending colon [facility])  .  Asthma   . COPD (chronic obstructive pulmonary disease) ([place])   . Gout   . Hemorrhoids   . Hypercholesteremia   . Hypertension   . Sleep apnea    doesn't wear CPAP  . Syncope 10/11   in settin gof asthma exacerbation w coughing. Ech (10/11): EF > 55%, mild LVH, grade I diastolic dysfunction, nomral RV size and systolic function,. normal valves. Carotid US (10/11): minimal disease    Tobacco Use: Social History   Tobacco Use  Smoking Status Former Smoker  . Packs/day: 0.25  . Years: 35.00  . Pack years: 8.75  . Types: Cigarettes  . Quit date: 10/22/1995  . Years since quitting: 24.3  Smokeless Tobacco Current User  . Types: Chew  Tobacco Comment   rarely smoked cigarettes and cigars.  states he smoked drugs mostly    Labs: Recent Review Flowsheet Data    Labs for ITP Cardiac and Pulmonary Rehab Latest Ref Rng & Units 08/23/2016 04/01/2018 07/20/2018 02/15/2019 08/30/2019   Cholestrol 0 - 200 mg/dL 174 168 - 136 123   LDLCALC 0 - 99 mg/dL - 75 - 71 57   LDLDIRECT mg/dL 53.0 - - - -   HDL >39.00 mg/dL 97.10 81.60 - 44.50 51.00   Trlycerides 0.0 - 149.0 mg/dL 257.0(H) 58.0 - 103.0 76.0   Hemoglobin A1c 4.6 - 6.5 % 6.2 6.1 5.7(H) 6.8(H) 6.3   TCO2 0 - 100 mmol/L - - - - -  Pulmonary Assessment Scores: Pulmonary Assessment Scores    Row Name 10/05/19 1501         ADL UCSD   ADL Phase  Entry     SOB Score total  79     Rest  1     Walk  3     Stairs  4     Bath  5     Dress  4     Shop  4       CAT Score   CAT Score  25       mMRC Score   mMRC Score  4        UCSD: Self-administered rating of dyspnea associated with activities of daily living (ADLs) 6-point scale (0 = "not at all" to 5 = "maximal or unable to do because of breathlessness")  Scoring Scores range from 0 to 120.  Minimally important difference is 5 units  CAT: CAT can identify the health impairment of COPD patients and is better correlated with disease progression.  CAT has a scoring range  of zero to 40. The CAT score is classified into four groups of low (less than 10), medium (10 - 20), high (21-30) and very high (31-40) based on the impact level of disease on health status. A CAT score over 10 suggests significant symptoms.  A worsening CAT score could be explained by an exacerbation, poor medication adherence, poor inhaler technique, or progression of COPD or comorbid conditions.  CAT MCID is 2 points  mMRC: mMRC (Modified Medical Research Council) Dyspnea Scale is used to assess the degree of baseline functional disability in patients of respiratory disease due to dyspnea. No minimal important difference is established. A decrease in score of 1 point or greater is considered a positive change.   Pulmonary Function Assessment:   Exercise Target Goals: Exercise Program Goal: Individual exercise prescription set using results from initial 6 min walk test and THRR while considering  patient's activity barriers and safety.   Exercise Prescription Goal: Initial exercise prescription builds to 30-45 minutes a day of aerobic activity, 2-3 days per week.  Home exercise guidelines will be given to patient during program as part of exercise prescription that the participant will acknowledge.  Education: Aerobic Exercise & Resistance Training: - Gives group verbal and written instruction on the various components of exercise. Focuses on aerobic and resistive training programs and the benefits of this training and how to safely progress through these programs..   Education: Exercise & Equipment Safety: - Individual verbal instruction and demonstration of equipment use and safety with use of the equipment.   Pulmonary Rehab from 10/05/2019 in [facility] Cardiac and Pulmonary Rehab  Date  10/05/19  Educator  [facility]  Instruction Review Code  1- Verbalizes Understanding      Education: Exercise Physiology & General Exercise Guidelines: - Group verbal and written instruction with models to  review the exercise physiology of the cardiovascular system and associated critical values. Provides general exercise guidelines with specific guidelines to those with heart or lung disease.    Education: Flexibility, Balance, Mind/Body Relaxation: Provides group verbal/written instruction on the benefits of flexibility and balance training, including mind/body exercise modes such as yoga, pilates and tai chi.  Demonstration and skill practice provided.   Activity Barriers & Risk Stratification: Activity Barriers & Cardiac Risk Stratification - 10/05/19 1451      Activity Barriers & Cardiac Risk Stratification   Activity Barriers  Deconditioning;Muscular Weakness;Other (comment);Shortness of Breath;Balance Concerns  Comments  shuffles feet, tremors       6 Minute Walk: 6 Minute Walk    Row Name 10/05/19 1441         6 Minute Walk   Phase  Initial     Distance  994 feet     Walk Time  6 minutes     # of Rest Breaks  0     MPH  1.88     METS  2.82     RPE  15     Perceived Dyspnea   3     VO2 Peak  9.88     Symptoms  Yes (comment)     Comments  hip pain 3/10, SOB, knee pain 3/10, SOB     Resting HR  90 bpm     Resting BP  112/64     Resting Oxygen Saturation   97 %     Exercise Oxygen Saturation  during 6 min walk  95 %     Max Ex. HR  125 bpm     Max Ex. BP  146/64     2 Minute Post BP  134/70       Interval HR   1 Minute HR  99     2 Minute HR  119     3 Minute HR  114     4 Minute HR  102     5 Minute HR  125     6 Minute HR  98     2 Minute Post HR  92     Interval Heart Rate?  Yes       Interval Oxygen   Interval Oxygen?  Yes     Baseline Oxygen Saturation %  97 %     1 Minute Oxygen Saturation %  96 %     1 Minute Liters of Oxygen  0 L Room Air     2 Minute Oxygen Saturation %  95 %     2 Minute Liters of Oxygen  0 L     3 Minute Oxygen Saturation %  96 %     3 Minute Liters of Oxygen  0 L     4 Minute Oxygen Saturation %  95 %     4 Minute Liters  of Oxygen  0 L     5 Minute Oxygen Saturation %  95 %     5 Minute Liters of Oxygen  0 L     6 Minute Oxygen Saturation %  96 %     6 Minute Liters of Oxygen  0 L     2 Minute Post Oxygen Saturation %  97 %     2 Minute Post Liters of Oxygen  0 L       Oxygen Initial Assessment: Oxygen Initial Assessment - 10/04/19 1036      Home Oxygen   Home Oxygen Device  Home Concentrator    Sleep Oxygen Prescription  Continuous    Liters per minute  2    Home Exercise Oxygen Prescription  None    Home at Rest Exercise Oxygen Prescription  None    Compliance with Home Oxygen Use  Yes      Initial 6 min Walk   Oxygen Used  None      Program Oxygen Prescription   Program Oxygen Prescription  None      Intervention   Short Term Goals  To learn and exhibit compliance with  exercise, home and travel O2 prescription;To learn and understand importance of maintaining oxygen saturations>88%;To learn and demonstrate proper use of respiratory medications;To learn and demonstrate proper pursed lip breathing techniques or other breathing techniques.;To learn and understand importance of monitoring SPO2 with pulse oximeter and demonstrate accurate use of the pulse oximeter.    Long  Term Goals  Verbalizes importance of monitoring SPO2 with pulse oximeter and return demonstration;Exhibits proper breathing techniques, such as pursed lip breathing or other method taught during program session;Demonstrates proper use of MDI's;Exhibits compliance with exercise, home and travel O2 prescription;Maintenance of O2 saturations>88%;Compliance with respiratory medication       Oxygen Re-Evaluation: Oxygen Re-Evaluation    Row Name 10/06/19 0858 11/11/19 0844 12/02/19 0842         Program Oxygen Prescription   Program Oxygen Prescription  None  None  None       Home Oxygen   Home Oxygen Device  Home Concentrator  Home Concentrator  Home Concentrator     Sleep Oxygen Prescription  Continuous  Continuous   Continuous     Liters per minute  2  2  2      Home Exercise Oxygen Prescription  None  None  None     Home at Rest Exercise Oxygen Prescription  None  None  None     Compliance with Home Oxygen Use  Yes  Yes  Yes       Goals/Expected Outcomes   Short Term Goals  To learn and understand importance of monitoring SPO2 with pulse oximeter and demonstrate accurate use of the pulse oximeter.;To learn and understand importance of maintaining oxygen saturations>88%;To learn and demonstrate proper pursed lip breathing techniques or other breathing techniques.  To learn and exhibit compliance with exercise, home and travel O2 prescription;To learn and understand importance of monitoring SPO2 with pulse oximeter and demonstrate accurate use of the pulse oximeter.;To learn and understand importance of maintaining oxygen saturations>88%;To learn and demonstrate proper pursed lip breathing techniques or other breathing techniques.;To learn and demonstrate proper use of respiratory medications  To learn and exhibit compliance with exercise, home and travel O2 prescription;To learn and understand importance of monitoring SPO2 with pulse oximeter and demonstrate accurate use of the pulse oximeter.;To learn and understand importance of maintaining oxygen saturations>88%;To learn and demonstrate proper pursed lip breathing techniques or other breathing techniques.;To learn and demonstrate proper use of respiratory medications     Long  Term Goals  Verbalizes importance of monitoring SPO2 with pulse oximeter and return demonstration;Maintenance of O2 saturations>88%;Exhibits proper breathing techniques, such as pursed lip breathing or other method taught during program session  Exhibits compliance with exercise, home and travel O2 prescription;Verbalizes importance of monitoring SPO2 with pulse oximeter and return demonstration;Maintenance of O2 saturations>88%;Exhibits proper breathing techniques, such as pursed lip  breathing or other method taught during program session;Compliance with respiratory medication;Demonstrates proper use of MDI's  Exhibits compliance with exercise, home and travel O2 prescription;Verbalizes importance of monitoring SPO2 with pulse oximeter and return demonstration;Maintenance of O2 saturations>88%;Exhibits proper breathing techniques, such as pursed lip breathing or other method taught during program session;Compliance with respiratory medication;Demonstrates proper use of MDI's     Comments  Reviewed PLB technique with pt.  Talked about how it works and it's importance in maintaining their exercise saturations.  Reviewed PLB - pt is using inhalers as directed.  Pateient doesnt currently have oximeter at home.  He will order one and check O2 at home  Art is doing well with  oxygen at night, but he does still snore.  He is using PLB at home.  He is keeping a close eye on his saturations and they are good for most part.     Goals/Expected Outcomes  Short: Become more profiecient at using PLB.   Long: Become independent at using PLB.  Short :  continue to practice PLB Long ; become independent using PLB  Short: Continue to montior saturations and use PLB.  Long: Continue to manage pulmonary disease.        Oxygen Discharge (Final Oxygen Re-Evaluation): Oxygen Re-Evaluation - 12/02/19 0842      Program Oxygen Prescription   Program Oxygen Prescription  None      Home Oxygen   Home Oxygen Device  Home Concentrator    Sleep Oxygen Prescription  Continuous    Liters per minute  2    Home Exercise Oxygen Prescription  None    Home at Rest Exercise Oxygen Prescription  None    Compliance with Home Oxygen Use  Yes      Goals/Expected Outcomes   Short Term Goals  To learn and exhibit compliance with exercise, home and travel O2 prescription;To learn and understand importance of monitoring SPO2 with pulse oximeter and demonstrate accurate use of the pulse oximeter.;To learn and understand  importance of maintaining oxygen saturations>88%;To learn and demonstrate proper pursed lip breathing techniques or other breathing techniques.;To learn and demonstrate proper use of respiratory medications    Long  Term Goals  Exhibits compliance with exercise, home and travel O2 prescription;Verbalizes importance of monitoring SPO2 with pulse oximeter and return demonstration;Maintenance of O2 saturations>88%;Exhibits proper breathing techniques, such as pursed lip breathing or other method taught during program session;Compliance with respiratory medication;Demonstrates proper use of MDI's    Comments  Art is doing well with oxygen at night, but he does still snore.  He is using PLB at home.  He is keeping a close eye on his saturations and they are good for most part.    Goals/Expected Outcomes  Short: Continue to montior saturations and use PLB.  Long: Continue to manage pulmonary disease.       Initial Exercise Prescription: Initial Exercise Prescription - 10/05/19 1400      Date of Initial Exercise RX and Referring Provider   Date  10/05/19    Referring Provider  [name] MD      Treadmill   MPH  1.6    Grade  0.5    Minutes  15    METs  2.34      NuStep   Level  2    SPM  80    Minutes  15    METs  2      REL-XR   Level  1    Speed  50    Minutes  15    METs  2      T5 Nustep   Level  2    SPM  80    Minutes  15    METs  2      Prescription Details   Frequency (times per week)  3    Duration  Progress to 30 minutes of continuous aerobic without signs/symptoms of physical distress      Intensity   THRR 40-80% of Max Heartrate  117-144    Ratings of Perceived Exertion  11-13    Perceived Dyspnea  0-4      Progression   Progression  Continue to progress workloads  to maintain intensity without signs/symptoms of physical distress.      Resistance Training   Training Prescription  Yes    Weight  3 lb    Reps  10-15       Perform Capillary Blood Glucose  checks as needed.  Exercise Prescription Changes: Exercise Prescription Changes    Row Name 10/05/19 1400 10/21/19 1300 11/05/19 1000 11/15/19 1700 12/01/19 1000     Response to Exercise   Blood Pressure (Admit)  112/64  136/70  142/80  138/78  150/80   Blood Pressure (Exercise)  146/64  164/84  140/66  148/70  180/80   Blood Pressure (Exit)  142/70  --  128/80  130/80  156/76   Heart Rate (Admit)  90 bpm  97 bpm  98 bpm  95 bpm  90 bpm   Heart Rate (Exercise)  125 bpm  106 bpm  103 bpm  113 bpm  122 bpm   Heart Rate (Exit)  95 bpm  101 bpm  98 bpm  90 bpm  104 bpm   Oxygen Saturation (Admit)  97 %  97 %  96 %  95 %  97 %   Oxygen Saturation (Exercise)  95 %  96 %  96 %  94 %  94 %   Oxygen Saturation (Exit)  98 %  96 %  96 %  96 %  97 %   Rating of Perceived Exertion (Exercise)  15  14  15  13  13    Perceived Dyspnea (Exercise)  3  3  3   --  3   Symptoms  hip pain 3/10, knee pain 3/10, SOB  --  SOB  --  fatigue   Comments  walk test results  --  --  --  --   Duration  --  Continue with 30 min of aerobic exercise without signs/symptoms of physical distress.  Continue with 30 min of aerobic exercise without signs/symptoms of physical distress.  Continue with 30 min of aerobic exercise without signs/symptoms of physical distress.  Continue with 30 min of aerobic exercise without signs/symptoms of physical distress.   Intensity  --  THRR unchanged  THRR unchanged  THRR unchanged  THRR unchanged     Progression   Progression  --  Continue to progress workloads to maintain intensity without signs/symptoms of physical distress.  Continue to progress workloads to maintain intensity without signs/symptoms of physical distress.  Continue to progress workloads to maintain intensity without signs/symptoms of physical distress.  Continue to progress workloads to maintain intensity without signs/symptoms of physical distress.   Average METs  --  2.2  2.46  2.2  2.2     Resistance Training   Training  Prescription  --  Yes  Yes  Yes  Yes   Weight  --  3 lb  6 lb  6 lb  6 lb   Reps  --  10-15  10-15  10-15  10-15     Interval Training   Interval Training  --  --  No  No  No     Treadmill   MPH  --  1.6  1.8  --  --   Grade  --  0.5  0.5  --  --   Minutes  --  15  15  --  --   METs  --  2.34  2.5  --  --     Recumbant Bike   Level  --  --  --  8  5   RPM  --  --  --  50  --   Minutes  --  --  --  15  15   METs  --  --  --  2.2  --     NuStep   Level  --  2  --  2  4   SPM  --  80  --  80  --   Minutes  --  15  --  15  15   METs  --  2  --  2  2.2     REL-XR   Level  --  --  1  --  --   Minutes  --  --  15  --  --   METs  --  --  2.8  --  --     T5 Nustep   Level  --  --  2  --  --   Minutes  --  --  15  --  --   METs  --  --  2.1  --  --   Row Name 12/14/19 1400 01/13/20 1300 01/28/20 1300         Response to Exercise   Blood Pressure (Admit)  138/76  124/82  140/74     Blood Pressure (Exercise)  184/88  164/74  172/78     Blood Pressure (Exit)  138/80  132/86  126/66     Heart Rate (Admit)  90 bpm  87 bpm  85 bpm     Heart Rate (Exercise)  113 bpm  111 bpm  108 bpm     Heart Rate (Exit)  105 bpm  98 bpm  90 bpm     Oxygen Saturation (Admit)  96 %  97 %  96 %     Oxygen Saturation (Exercise)  97 %  96 %  96 %     Oxygen Saturation (Exit)  97 %  97 %  97 %     Rating of Perceived Exertion (Exercise)  13  14  14      Perceived Dyspnea (Exercise)  3  3  2      Duration  Continue with 30 min of aerobic exercise without signs/symptoms of physical distress.  Continue with 30 min of aerobic exercise without signs/symptoms of physical distress.  Continue with 30 min of aerobic exercise without signs/symptoms of physical distress.     Intensity  THRR unchanged  THRR unchanged  THRR unchanged       Progression   Progression  Continue to progress workloads to maintain intensity without signs/symptoms of physical distress.  Continue to progress workloads to maintain intensity  without signs/symptoms of physical distress.  Continue to progress workloads to maintain intensity without signs/symptoms of physical distress.     Average METs  3  3  2.8       Resistance Training   Training Prescription  Yes  Yes  Yes     Weight  6 lb  6 lb  6 lb     Reps  10-15  10-15  10-15       Interval Training   Interval Training  --  No  No       Treadmill   MPH  --  2.1  --     Grade  --  0  --     Minutes  --  15  --     METs  --  2.61  --  Recumbant Bike   Level  8  --  8     RPM  50  --  50     Watts  48  --  34     Minutes  15  --  15     METs  3.3  --  2.9       NuStep   Level  5  --  --     SPM  80  --  --     Minutes  15  --  --     METs  2.69  --  --       REL-XR   Level  --  5  --     Speed  --  50  --     Minutes  --  15  --        Exercise Comments: Exercise Comments    Row Name 10/06/19 0856 10/08/19 0920 11/04/19 0900       Exercise Comments  First full day of exercise!  Patient was oriented to gym and equipment including functions, settings, policies, and procedures.  Patient's individual exercise prescription and treatment plan were reviewed.  All starting workloads were established based on the results of the 6 minute walk test done at initial orientation visit.  The plan for exercise progression was also introduced and progression will be customized based on patient's performance and goals.  First full day of exercise!  Patient was oriented to gym and equipment including functions, settings, policies, and procedures.  Patient's individual exercise prescription and treatment plan were reviewed.  All starting workloads were established based on the results of the 6 minute walk test done at initial orientation visit.  The plan for exercise progression was also introduced and progression will be customized based on patient's performance and goals.  Virtual call completed today. Calls are done every week that patient is not attending an onsite  exercise session during the COVID 19 PHE Crisis.   Today's call included review of :their home exercise  Art had ED visit yesterday for raw,swollen throat. Has been prescribed a Z Pack. He is aware to finish med and be symptom free before returning to program. He is taking it easy this week as he heals.        Exercise Goals and Review: Exercise Goals    Row Name 10/05/19 1455             Exercise Goals   Increase Physical Activity  Yes       Intervention  Provide advice, education, support and counseling about physical activity/exercise needs.;Develop an individualized exercise prescription for aerobic and resistive training based on initial evaluation findings, risk stratification, comorbidities and participant's personal goals.       Expected Outcomes  Short Term: Attend rehab on a regular basis to increase amount of physical activity.;Long Term: Add in home exercise to make exercise part of routine and to increase amount of physical activity.;Long Term: Exercising regularly at least 3-5 days a week.       Increase Strength and Stamina  Yes       Intervention  Provide advice, education, support and counseling about physical activity/exercise needs.;Develop an individualized exercise prescription for aerobic and resistive training based on initial evaluation findings, risk stratification, comorbidities and participant's personal goals.       Expected Outcomes  Short Term: Increase workloads from initial exercise prescription for resistance, speed, and METs.;Short Term: Perform resistance training exercises routinely during rehab  and add in resistance training at home;Long Term: Improve cardiorespiratory fitness, muscular endurance and strength as measured by increased METs and functional capacity (6MWT)       Able to understand and use rate of perceived exertion (RPE) scale  Yes       Intervention  Provide education and explanation on how to use RPE scale       Expected Outcomes  Long Term:   Able to use RPE to guide intensity level when exercising independently;Short Term: Able to use RPE daily in rehab to express subjective intensity level       Able to understand and use Dyspnea scale  Yes       Intervention  Provide education and explanation on how to use Dyspnea scale       Expected Outcomes  Short Term: Able to use Dyspnea scale daily in rehab to express subjective sense of shortness of breath during exertion;Long Term: Able to use Dyspnea scale to guide intensity level when exercising independently       Knowledge and understanding of Target Heart Rate Range (THRR)  Yes       Intervention  Provide education and explanation of THRR including how the numbers were predicted and where they are located for reference       Expected Outcomes  Short Term: Able to state/look up THRR;Short Term: Able to use daily as guideline for intensity in rehab;Long Term: Able to use THRR to govern intensity when exercising independently       Able to check pulse independently  Yes       Intervention  Provide education and demonstration on how to check pulse in carotid and radial arteries.;Review the importance of being able to check your own pulse for safety during independent exercise       Expected Outcomes  Short Term: Able to explain why pulse checking is important during independent exercise;Long Term: Able to check pulse independently and accurately       Understanding of Exercise Prescription  Yes       Intervention  Provide education, explanation, and written materials on patient's individual exercise prescription       Expected Outcomes  Short Term: Able to explain program exercise prescription;Long Term: Able to explain home exercise prescription to exercise independently          Exercise Goals Re-Evaluation : Exercise Goals Re-Evaluation    Row Name 10/06/19 0857 10/08/19 0920 10/21/19 1344 11/05/19 1006 12/01/19 1049     Exercise Goal Re-Evaluation   Exercise Goals Review  Able to  understand and use rate of perceived exertion (RPE) scale;Able to understand and use Dyspnea scale;Knowledge and understanding of Target Heart Rate Range (THRR);Understanding of Exercise Prescription  Able to understand and use rate of perceived exertion (RPE) scale;Knowledge and understanding of Target Heart Rate Range (THRR);Understanding of Exercise Prescription  Able to understand and use rate of perceived exertion (RPE) scale;Able to understand and use Dyspnea scale;Knowledge and understanding of Target Heart Rate Range (THRR);Able to check pulse independently;Understanding of Exercise Prescription  Increase Physical Activity;Increase Strength and Stamina;Understanding of Exercise Prescription  Increase Physical Activity;Increase Strength and Stamina;Understanding of Exercise Prescription   Comments  Reviewed RPE and dyspnea scale, THR and program prescription with pt today.  Pt voiced understanding and was given a copy of goals to take home.  Reviewed RPE scale, THR and program prescription with pt today.  Pt voiced understanding and was given a copy of goals to take home.  Art is  tolerating exercise well and will work on Catering manager on TM.  Staff will monitor progress.  Art has been doing well with rehab.  He has been holding steady at 2.1 METS on T5 NuStep and 2.8 METs on XR.  We will start to move up his workloads and continue to montior his progress.  Art has been doing well in rehab.  He reduced his workloads some yesterday as he had been out sick the previous week.  He is glad to be back to class.  We will continue to monitor his progress.   Expected Outcomes  Short: Use RPE and dyspnea scales daily to regulate intensity. Long: Follow program prescription in THR.  Short: Use RPE daily to regulate intensity. Long: Follow program prescription in THR.  Short - exercise consistently Long - increase overall stamina  Short: Increase workloads. Long: Continue to improve stamina.  Short: Return to regular  attendance.  Long: Continue to improve strength and stamina.   [place] Name 12/02/19 6063 12/14/19 1456 12/29/19 1325 01/13/20 1320 01/28/20 1335     Exercise Goal Re-Evaluation   Exercise Goals Review  Increase Physical Activity;Increase Strength and Stamina;Understanding of Exercise Prescription  Increase Physical Activity;Increase Strength and Stamina;Able to understand and use rate of perceived exertion (RPE) scale;Able to understand and use Dyspnea scale;Knowledge and understanding of Target Heart Rate Range (THRR);Able to check pulse independently;Understanding of Exercise Prescription  --  Increase Physical Activity;Increase Strength and Stamina;Able to understand and use rate of perceived exertion (RPE) scale;Able to understand and use Dyspnea scale;Knowledge and understanding of Target Heart Rate Range (THRR);Able to check pulse independently;Understanding of Exercise Prescription  Increase Physical Activity;Increase Strength and Stamina;Able to understand and use rate of perceived exertion (RPE) scale;Able to understand and use Dyspnea scale;Knowledge and understanding of Target Heart Rate Range (THRR);Able to check pulse independently;Understanding of Exercise Prescription   Comments  Art is doing well in rehab.  His exercise at home has been chasing his 4 and 38 yo grandkids.  Reviewed home exercise with pt today.  Pt plans to walking at track at school for exercise.  Reviewed THR, pulse, RPE, sign and symptoms, and when to call 911 or MD.  Also discussed weather considerations and indoor options.  Pt voiced understanding.  Art is back up to level 8 on RB.   He works at level 5 on [place].  He will progress more now that we are back to 2-3 days per week.  Out since last review with COVID-19  This is Arts first week back after being out.  He tolerated exercise well.  Art was at RPE 9 on bike - we discussed increasing resistance or RPM to be more challenging.   Expected Outcomes  Short: Walk consistently one  extra day a week.  Long: Continue to improve stamina.  Short : exercsie consistently 3 days per week Long : increase MET level  --  Short:  attend consistently Long: improve overall stamina  Short : continue to work in challenging RPE 11-15 Long: increase overall MET level      Discharge Exercise Prescription (Final Exercise Prescription Changes): Exercise Prescription Changes - 01/28/20 1300      Response to Exercise   Blood Pressure (Admit)  140/74    Blood Pressure (Exercise)  172/78    Blood Pressure (Exit)  126/66    Heart Rate (Admit)  85 bpm    Heart Rate (Exercise)  108 bpm    Heart Rate (Exit)  90 bpm  Oxygen Saturation (Admit)  96 %    Oxygen Saturation (Exercise)  96 %    Oxygen Saturation (Exit)  97 %    Rating of Perceived Exertion (Exercise)  14    Perceived Dyspnea (Exercise)  2    Duration  Continue with 30 min of aerobic exercise without signs/symptoms of physical distress.    Intensity  THRR unchanged      Progression   Progression  Continue to progress workloads to maintain intensity without signs/symptoms of physical distress.    Average METs  2.8      Resistance Training   Training Prescription  Yes    Weight  6 lb    Reps  10-15      Interval Training   Interval Training  No      Recumbant Bike   Level  8    RPM  50    Watts  34    Minutes  15    METs  2.9       Nutrition:  Target Goals: Understanding of nutrition guidelines, daily intake of sodium <1527m, cholesterol <2042m calories 30% from fat and 7% or less from saturated fats, daily to have 5 or more servings of fruits and vegetables.  Education: Controlling Sodium/Reading Food Labels -Group verbal and written material supporting the discussion of sodium use in heart healthy nutrition. Review and explanation with models, verbal and written materials for utilization of the food label.   Education: General Nutrition Guidelines/Fats and Fiber: -Group instruction provided by verbal,  written material, models and posters to present the general guidelines for heart healthy nutrition. Gives an explanation and review of dietary fats and fiber.   Biometrics: Pre Biometrics - 10/05/19 1455      Pre Biometrics   Height  5' 11.1" (1.806 m)    Weight  244 lb 11.2 oz (111 kg)    BMI (Calculated)  34.03    Single Leg Stand  3.12 seconds        Nutrition Therapy Plan and Nutrition Goals: Nutrition Therapy & Goals - 11/18/19 1401      Nutrition Therapy   Diet  Low Na, HH, DM    Protein (specify units)  90g    Fiber  30 grams    Whole Grain Foods  3 servings    Saturated Fats  12 max. grams    Fruits and Vegetables  5 servings/day    Sodium  1.5 grams      Personal Nutrition Goals   Nutrition Goal  ST: Add vegetables into burrito LT: like to get back to shape he was in 5 years ago, improve breathing and SOB    Comments  A1c. Pt using stevia for sweet tea instead of sugar. ~36 eggs/week. 2 scrambled egg burrito daily. May have peanut butter sandwich or some cookies. Sometimes have pasta salad with olive apple. Pt reports not salting anything. 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 Eats roasted unsalted nuts. Discussed HH eating. Suggested adding vegetables to burritos and making burritos whole wheat. Pt chose adding vegetables.      Intervention Plan   Intervention  Prescribe, educate and counsel regarding individualized specific dietary modifications aiming towards targeted core components such as weight, hypertension, lipid management, diabetes, heart failure and other comorbidities.;Nutrition handout(s) given to patient.    Expected Outcomes  Short Term Goal: Understand basic principles of dietary content, such as calories, fat, sodium, cholesterol and nutrients.;Short Term  Goal: A plan has been developed with personal nutrition goals set during dietitian appointment.;Long Term Goal: Adherence to prescribed  nutrition plan.       Nutrition Assessments: Nutrition Assessments - 10/05/19 1456      MEDFICTS Scores   Pre Score  77       MEDIFICTS Score Key:          ?70 Need to make dietary changes          40-70 Heart Healthy Diet         ? 40 Therapeutic Level Cholesterol Diet  Nutrition Goals Re-Evaluation: Nutrition Goals Re-Evaluation    [place] Name 01/03/20 [phone] 01/24/20 9528           Goals   Nutrition Goal  ST: Add vegetables into burrito LT: like to get back to shape he was in 5 years ago, improve breathing and SOB  ST: continue with current changes LT: like to get back to shape he was in 5 years ago, improve breathing and SOB      Comment  RD unable to complete re-eval this evaluation cycle. Pt currently out due to COVID19 +ve diagnosis. Will f/u with goals after pt return to rehab.  Pt reports that post covid his appetite is ok, SOB more and tired more. Pt reports not eating as many burritos anymore (1 instead of 3). Honoring hunger. Now pt eating more oranges and drinking apple juice.      Expected Outcome  Pt return to rehab  ST: continue with current changes  LT: like to get back to shape he was in 5 years ago, improve breathing and SOB         Nutrition Goals Discharge (Final Nutrition Goals Re-Evaluation): Nutrition Goals Re-Evaluation - 01/24/20 4132      Goals   Nutrition Goal  ST: continue with current changes LT: like to get back to shape he was in 5 years ago, improve breathing and SOB    Comment  Pt reports that post covid his appetite is ok, SOB more and tired more. Pt reports not eating as many burritos anymore (1 instead of 3). Honoring hunger. Now pt eating more oranges and drinking apple juice.    Expected Outcome  ST: continue with current changes  LT: like to get back to shape he was in 5 years ago, improve breathing and SOB       Psychosocial: Target Goals: Acknowledge presence or absence of significant depression and/or stress, maximize coping skills,  provide positive support system. Participant is able to verbalize types and ability to use techniques and skills needed for reducing stress and depression.   Education: Depression - Provides group verbal and written instruction on the correlation between heart/lung disease and depressed mood, treatment options, and the stigmas associated with seeking treatment.   Education: Sleep Hygiene -Provides group verbal and written instruction about how sleep can affect your health.  Define sleep hygiene, discuss sleep cycles and impact of sleep habits. Review good sleep hygiene tips.    Education: Stress and Anxiety: - Provides group verbal and written instruction about the health risks of elevated stress and causes of high stress.  Discuss the correlation between heart/lung disease and anxiety and treatment options. Review healthy ways to manage with stress and anxiety.   Initial Review & Psychosocial Screening: Initial Psych Review & Screening - 10/04/19 1037      Initial Review   Current issues with  Current Anxiety/Panic;Current Depression      Family  Dynamics   Good Support System?  Yes    Comments  He can look to his wife, daughter, son and his church for support.      Barriers   Psychosocial barriers to participate in program  The patient should benefit from training in stress management and relaxation.      Screening Interventions   Interventions  Provide feedback about the scores to participant;Encouraged to exercise;Program counselor consult;To provide support and resources with identified psychosocial needs    Expected Outcomes  Long Term Goal: Stressors or current issues are controlled or eliminated.;Short Term goal: Utilizing psychosocial counselor, staff and physician to assist with identification of specific Stressors or current issues interfering with healing process. Setting desired goal for each stressor or current issue identified.;Short Term goal: Identification and review  with participant of any Quality of Life or Depression concerns found by scoring the questionnaire.;Long Term goal: The participant improves quality of Life and PHQ9 Scores as seen by post scores and/or verbalization of changes       Quality of Life Scores:  Scores of 19 and below usually indicate a poorer quality of life in these areas.  A difference of  2-3 points is a clinically meaningful difference.  A difference of 2-3 points in the total score of the Quality of Life Index has been associated with significant improvement in overall quality of life, self-image, physical symptoms, and general health in studies assessing change in quality of life.  PHQ-9: Recent Review Flowsheet Data    Depression screen [facility] 2/9 02/03/2020 01/17/2020 10/05/2019 02/18/2019 08/18/2018   Decreased Interest 0 1 1 0 1   Down, Depressed, Hopeless 0 1 3 0 0   PHQ - 2 Score 0 2 4 0 1   Altered sleeping 0 3 0 - 3   Tired, decreased energy 0 3 3 - 3   Change in appetite 0 2 0 - 0   Feeling bad or failure about yourself  0 0 0 - 0   Trouble concentrating 0 0 1 - 0   Moving slowly or fidgety/restless 0 1 3 - 0   Suicidal thoughts 0 0 0 - 0   PHQ-9 Score 0 11 11 - 7   Difficult doing work/chores Not difficult at all Not difficult at all Somewhat difficult - Not difficult at all     Interpretation of Total Score  Total Score Depression Severity:  1-4 = Minimal depression, 5-9 = Mild depression, 10-14 = Moderate depression, 15-19 = Moderately severe depression, 20-27 = Severe depression   Psychosocial Evaluation and Intervention: Psychosocial Evaluation - 10/04/19 1043      Psychosocial Evaluation & Interventions   Interventions  Encouraged to exercise with the program and follow exercise prescription    Comments  He can look to his wife, daughter, son and his church for support. He states he has some depression and gets short of breath easily. He gets short of breath easily and wants to be able to do more without  getting so short of breath.    Expected Outcomes  Short: attend [facility] to decrease stress and improve SOB. Long: Exercise independently to keep stress at a minimum.    Continue Psychosocial Services   Follow up required by staff       Psychosocial Re-Evaluation: Psychosocial Re-Evaluation    [place] Name 11/11/19 [phone] 12/02/19 0838 01/17/20 0846         Psychosocial Re-Evaluation   Current issues with  Current Stress Concerns;Current Sleep Concerns  Current Stress Concerns;Current Sleep Concerns;Current Psychotropic Meds;Current Anxiety/Panic  History of Depression;Current Psychotropic Meds     Comments  Art states his meds take care of any stress/anxiety.  He sleeps well - has a Purple mattress and pillow.  Art is doing well overall.  His meds keep him well mangaed and denies current anxiety symptoms.  He gets tired after meals and naps.  He has been helping to care for his grandkids during the week which keeps him busy.  Reviewed patient health questionnaire (PHQ-9) with patient for follow up. Previously, patients score indicated signs/symptoms of depression.  Reviewed to see if patient is improving symptom wise while in program.  Score stayed the same patient states that it is because he has had these problems and wants to see his grandkids grow up.     Expected Outcomes  Short - continue to take meds as directed Long : maintain good sleep patterns  Short - continue to take meds as directed Long : maintain good sleep patterns  Short: Continue to work toward an improvement in [place] scores by attending [facility] regularly. Long: Continue to improve stress and depression coping skills by talking with staff and attending [facility] regularly and work toward a positive mental state.     Interventions  --  Encouraged to attend Pulmonary Rehabilitation for the exercise  Encouraged to attend Pulmonary Rehabilitation for the exercise     Continue Psychosocial Services   --  Follow up required by staff   Follow up required by staff        Psychosocial Discharge (Final Psychosocial Re-Evaluation): Psychosocial Re-Evaluation - 01/17/20 0846      Psychosocial Re-Evaluation   Current issues with  History of Depression;Current Psychotropic Meds    Comments  Reviewed patient health questionnaire (PHQ-9) with patient for follow up. Previously, patients score indicated signs/symptoms of depression.  Reviewed to see if patient is improving symptom wise while in program.  Score stayed the same patient states that it is because he has had these problems and wants to see his grandkids grow up.    Expected Outcomes  Short: Continue to work toward an improvement in [place] scores by attending [facility] regularly. Long: Continue to improve stress and depression coping skills by talking with staff and attending [facility] regularly and work toward a positive mental state.    Interventions  Encouraged to attend Pulmonary Rehabilitation for the exercise    Continue Psychosocial Services   Follow up required by staff       Education: Education Goals: Education classes will be provided on a weekly basis, covering required topics. Participant will state understanding/return demonstration of topics presented.  Learning Barriers/Preferences: Learning Barriers/Preferences - 10/04/19 1039      Learning Barriers/Preferences   Learning Barriers  None    Learning Preferences  None       General Pulmonary Education Topics:  Infection Prevention: - Provides verbal and written material to individual with discussion of infection control including proper hand washing and proper equipment cleaning during exercise session.   Pulmonary Rehab from 10/05/2019 in [facility]  Date  10/05/19  Educator  [facility]  Instruction Review Code  1- Verbalizes Understanding      Falls Prevention: - Provides verbal and written material to individual with discussion of falls prevention and safety.   Pulmonary Rehab  from 10/05/2019 in [facility]  Date  10/04/19  Educator  [facility]  Instruction Review Code  1- Verbalizes Understanding  Chronic Lung Diseases: - Group verbal and written instruction to review updates, respiratory medications, advancements in procedures and treatments. Discuss use of supplemental oxygen including available portable oxygen systems, continuous and intermittent flow rates, concentrators, personal use and safety guidelines. Review proper use of inhaler and spacers. Provide informative websites for self-education.    Energy Conservation: - Provide group verbal and written instruction for methods to conserve energy, plan and organize activities. Instruct on pacing techniques, use of adaptive equipment and posture/positioning to relieve shortness of breath.   Triggers and Exacerbations: - Group verbal and written instruction to review types of environmental triggers and ways to prevent exacerbations. Discuss weather changes, air quality and the benefits of nasal washing. Review warning signs and symptoms to help prevent infections. Discuss techniques for effective airway clearance, coughing, and vibrations.   AED/CPR: - Group verbal and written instruction with the use of models to demonstrate the basic use of the AED with the basic ABC's of resuscitation.   Anatomy and Physiology of the Lungs: - Group verbal and written instruction with the use of models to provide basic lung anatomy and physiology related to function, structure and complications of lung disease.   Anatomy & Physiology of the Heart: - Group verbal and written instruction and models provide basic cardiac anatomy and physiology, with the coronary electrical and arterial systems. Review of Valvular disease and Heart Failure   Cardiac Medications: - Group verbal and written instruction to review commonly prescribed medications for heart disease. Reviews the medication, class of the drug, and  side effects.   Other: -Provides group and verbal instruction on various topics (see comments)   Knowledge Questionnaire Score: Knowledge Questionnaire Score - 10/05/19 1458      Knowledge Questionnaire Score   Pre Score  16/18 education focus: O2 safety, ADLs        Core Components/Risk Factors/Patient Goals at Admission: Personal Goals and Risk Factors at Admission - 10/05/19 1459      Core Components/Risk Factors/Patient Goals on Admission    Weight Management  Yes;Weight Loss    Intervention  Weight Management: Develop a combined nutrition and exercise program designed to reach desired caloric intake, while maintaining appropriate intake of nutrient and fiber, sodium and fats, and appropriate energy expenditure required for the weight goal.;Weight Management: Provide education and appropriate resources to help participant work on and attain dietary goals.;Weight Management/Obesity: Establish reasonable short term and long term weight goals.;Obesity: Provide education and appropriate resources to help participant work on and attain dietary goals.    Admit Weight  244 lb 11.2 oz (111 kg)    Goal Weight: Short Term  235 lb (106.6 kg)    Goal Weight: Long Term  230 lb (104.3 kg)    Expected Outcomes  Short Term: Continue to assess and modify interventions until short term weight is achieved;Weight Maintenance: Understanding of the daily nutrition guidelines, which includes 25-35% calories from fat, 7% or less cal from saturated fats, less than 250m cholesterol, less than 1.5gm of sodium, & 5 or more servings of fruits and vegetables daily;Weight Loss: Understanding of general recommendations for a balanced deficit meal plan, which promotes 1-2 lb weight loss per week and includes a negative energy balance of [phone] kcal/d;Understanding recommendations for meals to include 15-35% energy as protein, 25-35% energy from fat, 35-60% energy from carbohydrates, less than 2023mof dietary  cholesterol, 20-35 gm of total fiber daily;Understanding of distribution of calorie intake throughout the day with the consumption of 4-5 meals/snacks;Long Term: Adherence  to nutrition and physical activity/exercise program aimed toward attainment of established weight goal    Tobacco Cessation  Yes    Intervention  Assist the participant in steps to quit. Provide individualized education and counseling about committing to Tobacco Cessation, relapse prevention, and pharmacological support that can be provided by physician.;Advice worker, assist with locating and accessing local/national Quit Smoking programs, and support quit date choice.    Expected Outcomes  Short Term: Will demonstrate readiness to quit, by selecting a quit date.;Long Term: Complete abstinence from all tobacco products for at least 12 months from quit date.;Short Term: Will quit all tobacco product use, adhering to prevention of relapse plan.    Improve shortness of breath with ADL's  Yes    Intervention  Provide education, individualized exercise plan and daily activity instruction to help decrease symptoms of SOB with activities of daily living.    Expected Outcomes  Short Term: Improve cardiorespiratory fitness to achieve a reduction of symptoms when performing ADLs;Long Term: Be able to perform more ADLs without symptoms or delay the onset of symptoms    Hypertension  Yes    Intervention  Provide education on lifestyle modifcations including regular physical activity/exercise, weight management, moderate sodium restriction and increased consumption of fresh fruit, vegetables, and low fat dairy, alcohol moderation, and smoking cessation.;Monitor prescription use compliance.    Expected Outcomes  Short Term: Continued assessment and intervention until BP is < 140/19m HG in hypertensive participants. < 130/842mHG in hypertensive participants with diabetes, heart failure or chronic kidney disease.;Long Term: Maintenance  of blood pressure at goal levels.    Lipids  Yes    Intervention  Provide education and support for participant on nutrition & aerobic/resistive exercise along with prescribed medications to achieve LDL <7064mHDL >38m75m  Expected Outcomes  Short Term: Participant states understanding of desired cholesterol values and is compliant with medications prescribed. Participant is following exercise prescription and nutrition guidelines.;Long Term: Cholesterol controlled with medications as prescribed, with individualized exercise RX and with personalized nutrition plan. Value goals: LDL < 70mg96mL > 40 mg.       Education:Diabetes - Individual verbal and written instruction to review signs/symptoms of diabetes, desired ranges of glucose level fasting, after meals and with exercise. Acknowledge that pre and post exercise glucose checks will be done for 3 sessions at entry of program.   Education: Know Your Numbers and Risk Factors: -Group verbal and written instruction about important numbers in your health.  Discussion of what are risk factors and how they play a role in the disease process.  Review of Cholesterol, Blood Pressure, Diabetes, and BMI and the role they play in your overall health.   Core Components/Risk Factors/Patient Goals Review:  Goals and Risk Factor Review    Row Name 11/11/19 0817 12/02/19 0840 01/17/20 0838 [id number]    Core Components/Risk Factors/Patient Goals Review   Personal Goals Review  Weight Management/Obesity;Improve shortness of breath with ADL's;Hypertension;Lipids  Weight Management/Obesity;Improve shortness of breath with ADL's;Hypertension;Lipids  Weight Management/Obesity;Improve shortness of breath with ADL's     Review  Art hasnt been checking BP at home.  [facility]  He does have a monitor at home.  He will start checking at home at least once a day.  His weight is holding at 247.  He does stretch when he gets up.  He can play with  his grandkids for short periods.  He would like to be  able to play more with them.  Art has an appointment with Dr. Fletcher Anon next week for a follow up.  He should get a good report.  His weight is creeping up and he hopes its muscle as he is feeling stronger overall.  He only eats twice a day as he doesn't move as much.  His pressures have been good and breathing is improving.  He has found that he can now go up the stairs!  Patients weight is up since the start of the program. He wants to work on weight loss. His shortness of breath has been a little worse when showering and getting ready for the day. He is feeling of in general but gets short of breath when doing certain activities.     Expected Outcomes  Short :  walk track at [employer] or the mall 2 days per week Long :  walk up and down stairs without getting short of breath  Short: Continue to work on weight loss.  Long: Continue to exericse for weight loss and SOB.  Short: Attend [facility] regularly to improve shortness of breath with ADL's. Long: maintain independence with ADL's        Core Components/Risk Factors/Patient Goals at Discharge (Final Review):  Goals and Risk Factor Review - 01/17/20 0838      Core Components/Risk Factors/Patient Goals Review   Personal Goals Review  Weight Management/Obesity;Improve shortness of breath with ADL's    Review  Patients weight is up since the start of the program. He wants to work on weight loss. His shortness of breath has been a little worse when showering and getting ready for the day. He is feeling of in general but gets short of breath when doing certain activities.    Expected Outcomes  Short: Attend [facility] regularly to improve shortness of breath with ADL's. Long: maintain independence with ADL's       ITP Comments: ITP Comments    Row Name 10/05/19 1232 10/06/19 0856 10/08/19 0919 10/20/19 0912 11/04/19 0900   ITP Comments  Completed 6MWT and gym orientation.  Initial ITP created and sent  for review to Dr. Emily Filbert, Medical Director.  First full day of exercise!  Patient was oriented to gym and equipment including functions, settings, policies, and procedures.  Patient's individual exercise prescription and treatment plan were reviewed.  All starting workloads were established based on the results of the 6 minute walk test done at initial orientation visit.  The plan for exercise progression was also introduced and progression will be customized based on patient's performance and goals.  First full day of exercise!  Patient was oriented to gym and equipment including functions, settings, policies, and procedures.  Patient's individual exercise prescription and treatment plan were reviewed.  All starting workloads were established based on the results of the 6 minute walk test done at initial orientation visit.  The plan for exercise progression was also introduced and progression will be customized based on patient's performance and goals.  30 day review competed . ITP sent to Dr Emily Filbert for review, changes as needed and ITP approval signature  New tp program  Virtual call completed today. Calls are done every week that patient is not attending an onsite exercise session during the COVID 19 PHE Crisis.   Today's call included review of :their home exercise  Art had ED visit yesterday for raw,swollen throat. Has been prescribed a Z Pack. He is aware to finish med and be symptom free before  returning to program. He is taking it easy this week as he heals.   [place] Name 11/04/19 8177 11/17/19 1453 11/18/19 1425 12/15/19 0707 12/20/19 1408   ITP Comments  Pt reports not being able to complete RD appointment due to recent ER visit and visiting grandchildren. Rescheduled RD evaluation with pt.  30 day review completed. ITP sent to Dr. Emily Filbert, Medical Director of Cardiac and Pulmonary Rehab. Continue with ITP unless changes are made by physician.  Department operating under reduced schedule until  further notice by request from hospital leadership.  Completed Initial RD Eval  30 day chart review completed. ITP sent to Dr Zachery Dakins Medical Director, for review,changes as needed and signature.  Art's wife called to let us know that he tested positive for COVID-19 today.  They were instructed to stay out for at least 20 days.   Row Name 01/03/20 0842 01/12/20 1013 02/09/20 1041       ITP Comments  RD unable to complete re-eval this evaluation cycle. Pt currently out due to COVID19 +ve diagnosis. Will f/u with goals after pt return to rehab.  30 day chart review completed. ITP sent to Dr Zachery Dakins Medical Director, for review,changes as needed and signature.  Has returned after hving COVID 19 virus  30 day chart review completed. ITP sent to Dr Zachery Dakins Medical Director, for review,changes as needed and signature. Continue with ITP if no changes requested        Comments:

## 2020-02-11 ENCOUNTER — Other Ambulatory Visit: Payer: Self-pay

## 2020-02-11 ENCOUNTER — Encounter: Payer: Medicare Other | Admitting: *Deleted

## 2020-02-11 DIAGNOSIS — J4550 Severe persistent asthma, uncomplicated: Secondary | ICD-10-CM

## 2020-02-11 DIAGNOSIS — G4730 Sleep apnea, unspecified: Secondary | ICD-10-CM | POA: Diagnosis not present

## 2020-02-11 DIAGNOSIS — I10 Essential (primary) hypertension: Secondary | ICD-10-CM | POA: Diagnosis not present

## 2020-02-11 DIAGNOSIS — J449 Chronic obstructive pulmonary disease, unspecified: Secondary | ICD-10-CM | POA: Diagnosis not present

## 2020-02-11 DIAGNOSIS — Z79899 Other long term (current) drug therapy: Secondary | ICD-10-CM | POA: Diagnosis not present

## 2020-02-11 DIAGNOSIS — E7800 Pure hypercholesterolemia, unspecified: Secondary | ICD-10-CM | POA: Diagnosis not present

## 2020-02-11 NOTE — Progress Notes (Signed)
Daily Session Note  Patient Details  Name: Cody Day MRN: [id number] Date of Birth: 02-Feb-1956 Referring Provider:     Pulmonary Rehab from 10/05/2019 in [facility] Cardiac and Pulmonary Rehab  Referring Provider  [name] MD      Encounter Date: 02/11/2020  Check In: Session Check In - 02/11/20 0819      Check-In   Supervising physician immediately available to respond to emergencies  See telemetry face sheet for immediately available ER MD    Location  [facility]-Cardiac & Pulmonary Rehab    Staff Present  Justin Mend RCP,RRT,BSRT;Jessica Luan Pulling, MA, RCEP, CCRP, CCET    Virtual Visit  No    Medication changes reported      No    Fall or balance concerns reported     No    Warm-up and Cool-down  Performed on first and last piece of equipment    Resistance Training Performed  Yes    VAD Patient?  No    PAD/SET Patient?  No      Pain Assessment   Currently in Pain?  No/denies          Social History   Tobacco Use  Smoking Status Former Smoker  . Packs/day: 0.25  . Years: 35.00  . Pack years: 8.75  . Types: Cigarettes  . Quit date: 10/22/1995  . Years since quitting: 24.3  Smokeless Tobacco Current User  . Types: Chew  Tobacco Comment   rarely smoked cigarettes and cigars.  states he smoked drugs mostly    Goals Met:  Independence with exercise equipment Exercise tolerated well No report of cardiac concerns or symptoms  Goals Unmet:  Not Applicable  Comments: Pt able to follow exercise prescription today without complaint.  Will continue to monitor for progression.   Dr. Emily Filbert is Medical Director for [place] and [facility].

## 2020-02-14 ENCOUNTER — Encounter: Payer: Medicare Other | Admitting: *Deleted

## 2020-02-14 ENCOUNTER — Other Ambulatory Visit: Payer: Self-pay

## 2020-02-14 DIAGNOSIS — G4730 Sleep apnea, unspecified: Secondary | ICD-10-CM | POA: Diagnosis not present

## 2020-02-14 DIAGNOSIS — J449 Chronic obstructive pulmonary disease, unspecified: Secondary | ICD-10-CM | POA: Diagnosis not present

## 2020-02-14 DIAGNOSIS — I10 Essential (primary) hypertension: Secondary | ICD-10-CM | POA: Diagnosis not present

## 2020-02-14 DIAGNOSIS — E7800 Pure hypercholesterolemia, unspecified: Secondary | ICD-10-CM | POA: Diagnosis not present

## 2020-02-14 DIAGNOSIS — Z79899 Other long term (current) drug therapy: Secondary | ICD-10-CM | POA: Diagnosis not present

## 2020-02-14 DIAGNOSIS — J4550 Severe persistent asthma, uncomplicated: Secondary | ICD-10-CM

## 2020-02-14 NOTE — Progress Notes (Signed)
Daily Session Note  Patient Details  Name: Cody Day MRN: [id number] Date of Birth: 07/06/1956 Referring Provider:     Pulmonary Rehab from 10/05/2019 in [facility] Cardiac and Pulmonary Rehab  Referring Provider  Kasa, Kurain MD      Encounter Date: 02/14/2020  Check In: Session Check In - 02/14/20 0947      Check-In   Supervising physician immediately available to respond to emergencies  See telemetry face sheet for immediately available ER MD    Location  [facility]-Cardiac & Pulmonary Rehab    Staff Present  Susanne Bice, RN, BSN, CCRP;Kelly Hayes, BS, ACSM CEP, Exercise Physiologist;Joseph Hood RCP,RRT,BSRT    Virtual Visit  No    Medication changes reported      No    Fall or balance concerns reported     No    Warm-up and Cool-down  Performed on first and last piece of equipment    Resistance Training Performed  Yes    VAD Patient?  No    PAD/SET Patient?  No      Pain Assessment   Currently in Pain?  No/denies          Social History   Tobacco Use  Smoking Status Former Smoker  . Packs/day: 0.25  . Years: 35.00  . Pack years: 8.75  . Types: Cigarettes  . Quit date: 10/22/1995  . Years since quitting: 24.3  Smokeless Tobacco Current User  . Types: Chew  Tobacco Comment   rarely smoked cigarettes and cigars.  states he smoked drugs mostly    Goals Met:  Proper associated with RPD/PD & O2 Sat Independence with exercise equipment Exercise tolerated well No report of cardiac concerns or symptoms  Goals Unmet:  Not Applicable  Comments: Pt able to follow exercise prescription today without complaint.  Will continue to monitor for progression.    Dr. Mark Miller is Medical Director for [facility] and [facility]. 

## 2020-02-16 ENCOUNTER — Other Ambulatory Visit: Payer: Self-pay

## 2020-02-16 ENCOUNTER — Encounter: Payer: Medicare Other | Admitting: *Deleted

## 2020-02-16 DIAGNOSIS — G4730 Sleep apnea, unspecified: Secondary | ICD-10-CM | POA: Diagnosis not present

## 2020-02-16 DIAGNOSIS — Z79899 Other long term (current) drug therapy: Secondary | ICD-10-CM | POA: Diagnosis not present

## 2020-02-16 DIAGNOSIS — J4550 Severe persistent asthma, uncomplicated: Secondary | ICD-10-CM

## 2020-02-16 DIAGNOSIS — I10 Essential (primary) hypertension: Secondary | ICD-10-CM | POA: Diagnosis not present

## 2020-02-16 DIAGNOSIS — E7800 Pure hypercholesterolemia, unspecified: Secondary | ICD-10-CM | POA: Diagnosis not present

## 2020-02-16 DIAGNOSIS — J449 Chronic obstructive pulmonary disease, unspecified: Secondary | ICD-10-CM | POA: Diagnosis not present

## 2020-02-16 NOTE — Progress Notes (Signed)
Daily Session Note  Patient Details  Name: Cody Day MRN: [id number] Date of Birth: 1956/10/07 Referring Provider:     Pulmonary Rehab from 10/05/2019 in [facility] Cardiac and Pulmonary Rehab  Referring Provider  [name] MD      Encounter Date: 02/16/2020  Check In: Session Check In - 02/16/20 0747      Check-In   Supervising physician immediately available to respond to emergencies  See telemetry face sheet for immediately available ER MD    Location  [facility]-Cardiac & Pulmonary Rehab    Staff Present  Nada Maclachlan, BA, ACSM CEP, Exercise Physiologist;Joseph Tessie Fass RCP,RRT,BSRT    Virtual Visit  No    Medication changes reported      No    Fall or balance concerns reported     No    Warm-up and Cool-down  Performed on first and last piece of equipment    Resistance Training Performed  Yes    VAD Patient?  No    PAD/SET Patient?  No      Pain Assessment   Currently in Pain?  No/denies          Social History   Tobacco Use  Smoking Status Former Smoker  . Packs/day: 0.25  . Years: 35.00  . Pack years: 8.75  . Types: Cigarettes  . Quit date: 10/22/1995  . Years since quitting: 24.3  Smokeless Tobacco Current User  . Types: Chew  Tobacco Comment   rarely smoked cigarettes and cigars.  states he smoked drugs mostly    Goals Met:  Proper associated with RPD/PD & O2 Sat Independence with exercise equipment Exercise tolerated well No report of cardiac concerns or symptoms  Goals Unmet:  Not Applicable  Comments: Pt able to follow exercise prescription today without complaint.  Will continue to monitor for progression.    Dr. Emily Filbert is Medical Director for [place] and [facility].

## 2020-02-17 ENCOUNTER — Other Ambulatory Visit: Payer: Self-pay

## 2020-02-17 DIAGNOSIS — R972 Elevated prostate specific antigen [PSA]: Secondary | ICD-10-CM

## 2020-02-17 DIAGNOSIS — E291 Testicular hypofunction: Secondary | ICD-10-CM

## 2020-02-18 ENCOUNTER — Other Ambulatory Visit: Payer: Medicare Other

## 2020-02-18 ENCOUNTER — Other Ambulatory Visit: Payer: Self-pay

## 2020-02-18 DIAGNOSIS — I10 Essential (primary) hypertension: Secondary | ICD-10-CM | POA: Diagnosis not present

## 2020-02-18 DIAGNOSIS — E291 Testicular hypofunction: Secondary | ICD-10-CM | POA: Diagnosis not present

## 2020-02-18 DIAGNOSIS — J4550 Severe persistent asthma, uncomplicated: Secondary | ICD-10-CM | POA: Diagnosis not present

## 2020-02-18 DIAGNOSIS — G4730 Sleep apnea, unspecified: Secondary | ICD-10-CM | POA: Diagnosis not present

## 2020-02-18 DIAGNOSIS — E7800 Pure hypercholesterolemia, unspecified: Secondary | ICD-10-CM | POA: Diagnosis not present

## 2020-02-18 DIAGNOSIS — J449 Chronic obstructive pulmonary disease, unspecified: Secondary | ICD-10-CM | POA: Diagnosis not present

## 2020-02-18 DIAGNOSIS — R972 Elevated prostate specific antigen [PSA]: Secondary | ICD-10-CM | POA: Diagnosis not present

## 2020-02-18 DIAGNOSIS — Z79899 Other long term (current) drug therapy: Secondary | ICD-10-CM | POA: Diagnosis not present

## 2020-02-18 NOTE — Progress Notes (Signed)
Daily Session Note  Patient Details  Name: Cody Day MRN: [id number] Date of Birth: 03/14/1956 Referring Provider:     Pulmonary Rehab from 10/05/2019 in [facility] Cardiac and Pulmonary Rehab  Referring Provider  Kasa, Kurain MD      Encounter Date: 02/18/2020  Check In: Session Check In - 02/18/20 0748      Check-In   Supervising physician immediately available to respond to emergencies  See telemetry face sheet for immediately available ER MD    Location  [facility]-Cardiac & Pulmonary Rehab    Staff Present  Leslie Castrejon RN, BSN;Jessica Hawkins, MA, RCEP, CCRP, CCET;Joseph Hood RCP,RRT,BSRT    Virtual Visit  No    Medication changes reported      No    Fall or balance concerns reported     No    Warm-up and Cool-down  Performed on first and last piece of equipment    Resistance Training Performed  Yes    VAD Patient?  No    PAD/SET Patient?  No      Pain Assessment   Currently in Pain?  No/denies          Social History   Tobacco Use  Smoking Status Former Smoker  . Packs/day: 0.25  . Years: 35.00  . Pack years: 8.75  . Types: Cigarettes  . Quit date: 10/22/1995  . Years since quitting: 24.3  Smokeless Tobacco Current User  . Types: Chew  Tobacco Comment   rarely smoked cigarettes and cigars.  states he smoked drugs mostly    Goals Met:  Proper associated with RPD/PD & O2 Sat Independence with exercise equipment Using PLB without cueing & demonstrates good technique Exercise tolerated well No report of cardiac concerns or symptoms Strength training completed today  Goals Unmet:  Not Applicable  Comments: Pt able to follow exercise prescription today without complaint.  Will continue to monitor for progression.   Dr. Mark Miller is Medical Director for [facility] and [facility]. 

## 2020-02-19 LAB — PSA: Prostate Specific Ag, Serum: 0.3 ng/mL (ref 0.0–4.0)

## 2020-02-19 LAB — TESTOSTERONE: Testosterone: 198 ng/dL — ABNORMAL LOW (ref 264–916)

## 2020-02-19 LAB — HEMATOCRIT: Hematocrit: 43.5 % (ref 37.5–51.0)

## 2020-02-21 ENCOUNTER — Encounter: Payer: Medicare Other | Attending: Internal Medicine | Admitting: *Deleted

## 2020-02-21 ENCOUNTER — Other Ambulatory Visit: Payer: Self-pay

## 2020-02-21 DIAGNOSIS — I10 Essential (primary) hypertension: Secondary | ICD-10-CM | POA: Diagnosis not present

## 2020-02-21 DIAGNOSIS — J4550 Severe persistent asthma, uncomplicated: Secondary | ICD-10-CM | POA: Diagnosis not present

## 2020-02-21 DIAGNOSIS — Z87891 Personal history of nicotine dependence: Secondary | ICD-10-CM | POA: Insufficient documentation

## 2020-02-21 DIAGNOSIS — E7800 Pure hypercholesterolemia, unspecified: Secondary | ICD-10-CM | POA: Insufficient documentation

## 2020-02-21 DIAGNOSIS — Z79899 Other long term (current) drug therapy: Secondary | ICD-10-CM | POA: Insufficient documentation

## 2020-02-21 DIAGNOSIS — J449 Chronic obstructive pulmonary disease, unspecified: Secondary | ICD-10-CM | POA: Insufficient documentation

## 2020-02-21 DIAGNOSIS — G4730 Sleep apnea, unspecified: Secondary | ICD-10-CM | POA: Diagnosis not present

## 2020-02-21 NOTE — Progress Notes (Signed)
Daily Session Note  Patient Details  Name: Cody Day MRN: [id number] Date of Birth: 20-Aug-1956 Referring Provider:     Pulmonary Rehab from 10/05/2019 in [facility] Cardiac and Pulmonary Rehab  Referring Provider  [name] MD      Encounter Date: 02/21/2020  Check In: Session Check In - 02/21/20 0834      Check-In   Supervising physician immediately available to respond to emergencies  See telemetry face sheet for immediately available ER MD    Location  [facility]-Cardiac & Pulmonary Rehab    Staff Present  Heath Lark, RN, BSN, Laveda Norman, BS, ACSM CEP, Exercise Physiologist;Joseph Tessie Fass RCP,RRT,BSRT    Virtual Visit  No    Medication changes reported      No    Fall or balance concerns reported     No    Warm-up and Cool-down  Performed on first and last piece of equipment    Resistance Training Performed  Yes    VAD Patient?  No    PAD/SET Patient?  No      Pain Assessment   Currently in Pain?  No/denies          Social History   Tobacco Use  Smoking Status Former Smoker  . Packs/day: 0.25  . Years: 35.00  . Pack years: 8.75  . Types: Cigarettes  . Quit date: 10/22/1995  . Years since quitting: 24.3  Smokeless Tobacco Current User  . Types: Chew  Tobacco Comment   rarely smoked cigarettes and cigars.  states he smoked drugs mostly    Goals Met:  Proper associated with RPD/PD & O2 Sat Independence with exercise equipment Exercise tolerated well No report of cardiac concerns or symptoms  Goals Unmet:  Not Applicable  Comments: Pt able to follow exercise prescription today without complaint.  Will continue to monitor for progression.    Dr. Emily Filbert is Medical Director for [place] and [facility].

## 2020-02-22 ENCOUNTER — Telehealth: Payer: Self-pay | Admitting: Family Medicine

## 2020-02-22 ENCOUNTER — Other Ambulatory Visit: Payer: Self-pay

## 2020-02-22 ENCOUNTER — Ambulatory Visit: Payer: Medicare Other

## 2020-02-22 ENCOUNTER — Other Ambulatory Visit (INDEPENDENT_AMBULATORY_CARE_PROVIDER_SITE_OTHER): Payer: Medicare Other

## 2020-02-22 DIAGNOSIS — R7303 Prediabetes: Secondary | ICD-10-CM

## 2020-02-22 DIAGNOSIS — E785 Hyperlipidemia, unspecified: Secondary | ICD-10-CM | POA: Diagnosis not present

## 2020-02-22 DIAGNOSIS — N411 Chronic prostatitis: Secondary | ICD-10-CM

## 2020-02-22 DIAGNOSIS — R7989 Other specified abnormal findings of blood chemistry: Secondary | ICD-10-CM

## 2020-02-22 LAB — COMPREHENSIVE METABOLIC PANEL
ALT: 17 U/L (ref 0–53)
AST: 18 U/L (ref 0–37)
Albumin: 4.4 g/dL (ref 3.5–5.2)
Alkaline Phosphatase: 66 U/L (ref 39–117)
BUN: 8 mg/dL (ref 6–23)
CO2: 30 mEq/L (ref 19–32)
Calcium: 9.4 mg/dL (ref 8.4–10.5)
Chloride: 98 mEq/L (ref 96–112)
Creatinine, Ser: 0.85 mg/dL (ref 0.40–1.50)
GFR: 90.96 mL/min (ref >60.00)
Glucose, Bld: 106 mg/dL — ABNORMAL HIGH (ref 70–99)
Potassium: 4.3 mEq/L (ref 3.5–5.1)
Sodium: 134 mEq/L — ABNORMAL LOW (ref 135–145)
Total Bilirubin: 1.3 mg/dL — ABNORMAL HIGH (ref 0.2–1.2)
Total Protein: 6.8 g/dL (ref 6.0–8.3)

## 2020-02-22 LAB — LIPID PANEL
Cholesterol: 141 mg/dL (ref 0–200)
HDL: 49.3 mg/dL (ref >39.00)
LDL Cholesterol: 73 mg/dL (ref 0–99)
NonHDL: 91.6
Total CHOL/HDL Ratio: 3
Triglycerides: 92 mg/dL (ref 0.0–149.0)
VLDL: 18.4 mg/dL (ref 0.0–40.0)

## 2020-02-22 LAB — HEMOGLOBIN A1C: Hgb A1c MFr Bld: 6.5 % (ref 4.6–6.5)

## 2020-02-22 NOTE — Progress Notes (Signed)
No critical labs need to be addressed urgently. We will discuss labs in detail at upcoming office visit.   

## 2020-02-22 NOTE — Telephone Encounter (Signed)
-----   Message from Cloyd Stagers, RT sent at 02/09/2020  1:37 PM EDT ----- Regarding: Lab Orders for Tuesday 5.4.2021 Please place lab orders for Tuesday 5.4.2021, office visit for physical on Tuesday 5.11.2021 Thank you, Dyke Maes RT(R)

## 2020-02-23 ENCOUNTER — Encounter: Payer: Medicare Other | Admitting: *Deleted

## 2020-02-23 ENCOUNTER — Encounter: Payer: Self-pay | Admitting: Urology

## 2020-02-23 ENCOUNTER — Ambulatory Visit (INDEPENDENT_AMBULATORY_CARE_PROVIDER_SITE_OTHER): Payer: Medicare Other | Admitting: Urology

## 2020-02-23 VITALS — BP 131/77 | HR 90 | Ht 71.0 in | Wt 250.0 lb

## 2020-02-23 VITALS — Ht 71.1 in | Wt 250.7 lb

## 2020-02-23 DIAGNOSIS — E7800 Pure hypercholesterolemia, unspecified: Secondary | ICD-10-CM | POA: Diagnosis not present

## 2020-02-23 DIAGNOSIS — E291 Testicular hypofunction: Secondary | ICD-10-CM

## 2020-02-23 DIAGNOSIS — J449 Chronic obstructive pulmonary disease, unspecified: Secondary | ICD-10-CM | POA: Diagnosis not present

## 2020-02-23 DIAGNOSIS — Z79899 Other long term (current) drug therapy: Secondary | ICD-10-CM | POA: Diagnosis not present

## 2020-02-23 DIAGNOSIS — I10 Essential (primary) hypertension: Secondary | ICD-10-CM | POA: Diagnosis not present

## 2020-02-23 DIAGNOSIS — J4550 Severe persistent asthma, uncomplicated: Secondary | ICD-10-CM

## 2020-02-23 DIAGNOSIS — G4730 Sleep apnea, unspecified: Secondary | ICD-10-CM | POA: Diagnosis not present

## 2020-02-23 MED ORDER — TESTOSTERONE CYPIONATE 200 MG/ML IM SOLN: INTRAMUSCULAR | 0 refills | Status: DC

## 2020-02-23 NOTE — Progress Notes (Signed)
Daily Session Note  Patient Details  Name: Cody Day MRN: [id number] Date of Birth: Aug 17, 1956 Referring Provider:     Pulmonary Rehab from 10/05/2019 in [facility] Cardiac and Pulmonary Rehab  Referring Provider  [name] MD      Encounter Date: 02/23/2020  Check In: Session Check In - 02/23/20 0751      Check-In   Supervising physician immediately available to respond to emergencies  See telemetry face sheet for immediately available ER MD    Location  [facility]-Cardiac & Pulmonary Rehab    Staff Present  Alberteen Sam, MA, RCEP, CCRP, CCET;Joseph Hood RCP,RRT,BSRT;Melissa [place] RDN, Rowe Pavy, [place], ACSM CEP, Exercise Physiologist;Other   Basilia Jumbo RN, BSN   Virtual Visit  No    Medication changes reported      No    Warm-up and Cool-down  Performed on first and last piece of equipment    Resistance Training Performed  Yes    VAD Patient?  No    PAD/SET Patient?  No      Pain Assessment   Currently in Pain?  No/denies          Social History   Tobacco Use  Smoking Status Former Smoker  . Packs/day: 0.25  . Years: 35.00  . Pack years: 8.75  . Types: Cigarettes  . Quit date: 10/22/1995  . Years since quitting: 24.3  Smokeless Tobacco Current User  . Types: Chew  Tobacco Comment   rarely smoked cigarettes and cigars.  states he smoked drugs mostly    Goals Met:  Proper associated with RPD/PD & O2 Sat Independence with exercise equipment Using PLB without cueing & demonstrates good technique Exercise tolerated well Personal goals reviewed No report of cardiac concerns or symptoms Strength training completed today  Goals Unmet:  Not Applicable  Comments: Pt able to follow exercise prescription today without complaint.  Will continue to monitor for progression.  [name] Name 10/05/19 1441 02/23/20 0941       6 Minute Walk   Phase  Initial  Discharge    Distance  994 feet  1190 feet    Distance % Change  --  19.7 %    Distance  Feet Change  --  196 ft    Walk Time  6 minutes  6 minutes    # of Rest Breaks  0  0    MPH  1.88  2.25    METS  2.82  2.82    RPE  15  13    Perceived Dyspnea   3  3    VO2 Peak  9.88  9.89    Symptoms  Yes (comment)  Yes (comment)    Comments  hip pain 3/10, SOB, knee pain 3/10, SOB  SOB    Resting HR  90 bpm  87 bpm    Resting BP  112/64  130/80    Resting Oxygen Saturation   97 %  98 %    Exercise Oxygen Saturation  during 6 min walk  95 %  96 %    Max Ex. HR  125 bpm  111 bpm    Max Ex. BP  146/64  136/64    2 Minute Post BP  134/70  134/70      Interval HR   1 Minute HR  99  99    2 Minute HR  119  96    3 Minute HR  114  100  4 Minute HR  102  103    5 Minute HR  125  111    6 Minute HR  98  110    2 Minute Post HR  92  93    Interval Heart Rate?  Yes  Yes      Interval Oxygen   Interval Oxygen?  Yes  Yes    Baseline Oxygen Saturation %  97 %  98 %    1 Minute Oxygen Saturation %  96 %  98 %    1 Minute Liters of Oxygen  0 L Room Air  0 L    2 Minute Oxygen Saturation %  95 %  98 %    2 Minute Liters of Oxygen  0 L  0 L    3 Minute Oxygen Saturation %  96 %  97 %    3 Minute Liters of Oxygen  0 L  0 L    4 Minute Oxygen Saturation %  95 %  96 %    4 Minute Liters of Oxygen  0 L  0 L    5 Minute Oxygen Saturation %  95 %  98 %    5 Minute Liters of Oxygen  0 L  0 L    6 Minute Oxygen Saturation %  96 %  97 %    6 Minute Liters of Oxygen  0 L  0 L    2 Minute Post Oxygen Saturation %  97 %  100 %    2 Minute Post Liters of Oxygen  0 L  0 L         Dr. Emily Filbert is Medical Director for [place] and [facility].

## 2020-02-23 NOTE — Progress Notes (Signed)
02/23/2020 9:58 AM   Cody Day 14-Dec-1955 [id number]  Referring provider: [name], MD [address] [place],  [place] [address]  Chief Complaint  Patient presents with  . Hypogonadism    Follow up    Urologic history: 1.  Hypogonadism -Testosterone cypionate 0.3 cc weekly -MR fusion biopsy 07/2019 for abnormal PSA velocity (PI-RADS 3 lesion), benign pathology   HPI: 64 y.o. male presents for semiannual follow-up.  Labs drawn 02/18/2020 with PSA back to baseline at 0.3, normal hematocrit and testosterone low at 198.  He is on weekly injections and states this blood was drawn 1.5 weeks after his last injection as he ran out of medication.  PMH: Past Medical History:  Diagnosis Date  . Achilles tendon rupture 10/11   and repair  . Adenomatous polyp of colon 04/06/2010   Adenomatous polyps 3 in the descending colon, times one in the ascending colon [facility])  . Asthma   . COPD (chronic obstructive pulmonary disease) ([place])   . Gout   . Hemorrhoids   . Hypercholesteremia   . Hypertension   . Sleep apnea    doesn't wear CPAP  . Syncope 10/11   in settin gof asthma exacerbation w coughing. Ech (10/11): EF > 55%, mild LVH, grade I diastolic dysfunction, nomral RV size and systolic function,. normal valves. Carotid US (10/11): minimal disease    Surgical History: Past Surgical History:  Procedure Laterality Date  . ABDOMINAL HERNIA REPAIR  1998,2000  . calcium kidney stones    . [facility], [name], [place]; Dr. [name]. Multiple adenomatous polyps (total 4).   . COLONOSCOPY WITH PROPOFOL N/A 08/30/2015   Procedure: COLONOSCOPY WITH PROPOFOL;  Surgeon: [name], MD;  Location: [facility] ENDOSCOPY;  Service: Endoscopy;  Laterality: N/A;  . FEMORAL HERNIA REPAIR  2007  . HEMORRHOID SURGERY    . HERNIA REPAIR  09/10/2010   Repair of recurrent ventral hernia, resection  of previously placed mesh x2, implantation 7.5 x 10 cm Gore-Tex dual mesh in an underlay position, repair of epigastric hernia with a 4.2 cm Proceed ventral patch.  Marland Kitchen HERNIA REPAIR   02//28/2007   Laparoscopic right inguinal hernia repair with Surgipro mesh, Ventrilex patch at umbilicus  . HERNIA REPAIR  11/01/1990  .   Small oval Kugel patch placed in preperitoneal space, [name], MD  . HERNIA REPAIR  01/17/1997   Recurrent ventral hernia 15 x 19 Gore-Tex dual mesh placed laparoscopically with multiple lefft--sided ports, [name], MD  . HERNIA REPAIR  01/07/1995    Primary repair of ventral hernia. [name], MD  . intestinal blockage     as a child  . KIDNEY STONE extraction     unic acid stones  . left heart cath  2008   minimal luminal irregularities EF 55%  . RIGHT/LEFT HEART CATH AND CORONARY ANGIOGRAPHY N/A 01/13/2017   Procedure: Right/Left Heart Cath and Coronary Angiography;  Surgeon: [name], MD;  Location: [place] [facility] LAB;  Service: Cardiovascular;  Laterality: N/A;  . treadmill stress test  2003    Home Medications:  Allergies as of 02/23/2020      Reactions   Levaquin [levofloxacin In D5w]    Severe headaches, skin tingling/redness   Penicillins Hives, Rash   Has patient had a PCN reaction causing immediate rash, facial/tongue/throat swelling, SOB or lightheadedness with hypotension: Yes Has  patient had a PCN reaction causing severe rash involving mucus membranes or skin necrosis: Unknown Has patient had a PCN reaction that required hospitalization: No Has patient had a PCN reaction occurring within the last 10 years: No - Childhood reaction If all of the above answers are "NO", then may proceed with Cephalosporin use.      Medication List       Accurate as of Feb 23, 2020  9:58 AM. If you have any questions, ask your nurse or doctor.        albuterol 108 (90 Base) MCG/ACT inhaler Commonly known as: Ventolin HFA Inhale 2 puffs into the lungs every  4 (four) hours as needed for wheezing or shortness of breath.   allopurinol 300 MG tablet Commonly known as: ZYLOPRIM TAKE 1 TABLET BY MOUTH DAILY   citalopram 40 MG tablet Commonly known as: CELEXA Take 1 tablet (40 mg total) by mouth daily.   fluticasone 220 MCG/ACT inhaler Commonly known as: FLOVENT HFA Inhale 2 puffs into the lungs 2 (two) times daily.   lisinopril 20 MG tablet Commonly known as: ZESTRIL TAKE 1 TABLET BY MOUTH DAILY   montelukast 10 MG tablet Commonly known as: SINGULAIR TAKE 1 TABLET BY MOUTH DAILY   multivitamin with minerals Tabs tablet Take 1 tablet by mouth daily.   simvastatin 20 MG tablet Commonly known as: ZOCOR TAKE 1 TABLET BY MOUTH DAILY   Stiolto Respimat 2.5-2.5 MCG/ACT Aers Generic drug: Tiotropium Bromide-Olodaterol USE 2 INHALATIONS EVERY DAY   testosterone cypionate 200 MG/ML injection Commonly known as: DEPOTESTOSTERONE CYPIONATE INJECT 0.3 ML (60 MG TOTAL) INTO THE MUSCLE ONCE A WEEK       Allergies:  Allergies  Allergen Reactions  . Levaquin [Levofloxacin In D5w]     Severe headaches, skin tingling/redness  . Penicillins Hives and Rash    Has patient had a PCN reaction causing immediate rash, facial/tongue/throat swelling, SOB or lightheadedness with hypotension: Yes Has patient had a PCN reaction causing severe rash involving mucus membranes or skin necrosis: Unknown Has patient had a PCN reaction that required hospitalization: No Has patient had a PCN reaction occurring within the last 10 years: No - Childhood reaction If all of the above answers are "NO", then may proceed with Cephalosporin use.     Family History: Family History  Problem Relation Age of Onset  . Hypertension Father   . Cataracts Father   . Diabetes Father   . Heart attack Mother 59  . Coronary artery disease Mother   . Dementia Mother   . Hypertension Sister   . Depression Sister   . Hypertension Brother   . Colon cancer Brother   .  Diabetes Other        1st degree relative  . Colon cancer Maternal Aunt     Social History:  reports that he quit smoking about 24 years ago. His smoking use included cigarettes. He has a 8.75 pack-year smoking history. His smokeless tobacco use includes chew. He reports previous alcohol use. He reports that he does not use drugs.   Physical Exam: BP 131/77   Pulse 90   Ht 5\' 11"  (1.803 m)   Wt 250 lb (113.4 kg)   BMI 34.87 kg/m   Constitutional:  Alert and oriented, No acute distress. HEENT: [place] AT, moist mucus membranes.  Trachea midline, no masses. Cardiovascular: No clubbing, cyanosis, or edema. Respiratory: Normal respiratory effort, no increased work of breathing. GI: Abdomen is soft, nontender, nondistended, no abdominal masses GU:  Prostate 40 g, smooth without nodules Skin: No rashes, bruises or suspicious lesions. Neurologic: Grossly intact, no focal deficits, moving all 4 extremities. Psychiatric: Normal mood and affect.   Assessment & Plan:    -Hypogonadism Stable symptoms on TRT.  Testosterone refilled.  46-month lab visit for testosterone, hematocrit and 1 year follow-up for PSA, DRE, testosterone and hematocrit.   Abbie Sons, [place] [address], [place], [place] [address] [phone]

## 2020-02-25 ENCOUNTER — Other Ambulatory Visit: Payer: Self-pay

## 2020-02-25 ENCOUNTER — Encounter: Payer: Medicare Other | Admitting: *Deleted

## 2020-02-25 DIAGNOSIS — I10 Essential (primary) hypertension: Secondary | ICD-10-CM | POA: Diagnosis not present

## 2020-02-25 DIAGNOSIS — E7800 Pure hypercholesterolemia, unspecified: Secondary | ICD-10-CM | POA: Diagnosis not present

## 2020-02-25 DIAGNOSIS — J4550 Severe persistent asthma, uncomplicated: Secondary | ICD-10-CM | POA: Diagnosis not present

## 2020-02-25 DIAGNOSIS — G4730 Sleep apnea, unspecified: Secondary | ICD-10-CM | POA: Diagnosis not present

## 2020-02-25 DIAGNOSIS — Z79899 Other long term (current) drug therapy: Secondary | ICD-10-CM | POA: Diagnosis not present

## 2020-02-25 DIAGNOSIS — J449 Chronic obstructive pulmonary disease, unspecified: Secondary | ICD-10-CM | POA: Diagnosis not present

## 2020-02-25 NOTE — Progress Notes (Signed)
Daily Session Note  Patient Details  Name: DANZIG MACGREGOR MRN: [id number] Date of Birth: 08-Feb-1956 Referring Provider:     Pulmonary Rehab from 10/05/2019 in [facility] Cardiac and Pulmonary Rehab  Referring Provider  [name] MD      Encounter Date: 02/25/2020  Check In: Session Check In - 02/25/20 0826      Check-In   Supervising physician immediately available to respond to emergencies  See telemetry face sheet for immediately available ER MD    Location  [facility]-Cardiac & Pulmonary Rehab    Staff Present  Heath Lark, RN, BSN, CCRP;Joseph Hood RCP,RRT,BSRT;Jessica [place], [place], [place], [place], CCET    Virtual Visit  No    Medication changes reported      No    Fall or balance concerns reported     No    Warm-up and Cool-down  Performed on first and last piece of equipment    Resistance Training Performed  Yes    VAD Patient?  No    PAD/SET Patient?  No      Pain Assessment   Currently in Pain?  No/denies          Social History   Tobacco Use  Smoking Status Former Smoker  . Packs/day: 0.25  . Years: 35.00  . Pack years: 8.75  . Types: Cigarettes  . Quit date: 10/22/1995  . Years since quitting: 24.3  Smokeless Tobacco Current User  . Types: Chew  Tobacco Comment   rarely smoked cigarettes and cigars.  states he smoked drugs mostly    Goals Met:  Proper associated with RPD/PD & O2 Sat Independence with exercise equipment Exercise tolerated well No report of cardiac concerns or symptoms  Goals Unmet:  Not Applicable  Comments: Pt able to follow exercise prescription today without complaint.  Will continue to monitor for progression.    Dr. Emily Filbert is Medical Director for [place] and [facility].

## 2020-02-28 ENCOUNTER — Encounter: Payer: Medicare Other | Admitting: *Deleted

## 2020-02-28 ENCOUNTER — Other Ambulatory Visit: Payer: Self-pay

## 2020-02-28 DIAGNOSIS — J4550 Severe persistent asthma, uncomplicated: Secondary | ICD-10-CM

## 2020-02-28 DIAGNOSIS — E7800 Pure hypercholesterolemia, unspecified: Secondary | ICD-10-CM | POA: Diagnosis not present

## 2020-02-28 DIAGNOSIS — Z79899 Other long term (current) drug therapy: Secondary | ICD-10-CM | POA: Diagnosis not present

## 2020-02-28 DIAGNOSIS — I10 Essential (primary) hypertension: Secondary | ICD-10-CM | POA: Diagnosis not present

## 2020-02-28 DIAGNOSIS — J449 Chronic obstructive pulmonary disease, unspecified: Secondary | ICD-10-CM | POA: Diagnosis not present

## 2020-02-28 DIAGNOSIS — G4730 Sleep apnea, unspecified: Secondary | ICD-10-CM | POA: Diagnosis not present

## 2020-02-28 NOTE — Progress Notes (Signed)
Daily Session Note  Patient Details  Name: Cody Day MRN: [id number] Date of Birth: 06/04/1956 Referring Provider:     Pulmonary Rehab from 10/05/2019 in [facility] Cardiac and Pulmonary Rehab  Referring Provider  [name] MD      Encounter Date: 02/28/2020  Check In: Session Check In - 02/28/20 0754      Check-In   Supervising physician immediately available to respond to emergencies  See telemetry face sheet for immediately available ER MD    Location  [facility]-Cardiac & Pulmonary Rehab    Staff Present  Heath Lark, RN, BSN, CCRP;Joseph Hood RCP,RRT,BSRT;Kelly Fries, [place], ACSM CEP, Exercise Physiologist    Virtual Visit  No    Medication changes reported      No    Fall or balance concerns reported     No    Warm-up and Cool-down  Performed on first and last piece of equipment    Resistance Training Performed  Yes    VAD Patient?  No    PAD/SET Patient?  No      Pain Assessment   Currently in Pain?  No/denies          Social History   Tobacco Use  Smoking Status Former Smoker  . Packs/day: 0.25  . Years: 35.00  . Pack years: 8.75  . Types: Cigarettes  . Quit date: 10/22/1995  . Years since quitting: 24.3  Smokeless Tobacco Current User  . Types: Chew  Tobacco Comment   rarely smoked cigarettes and cigars.  states he smoked drugs mostly    Goals Met:  Proper associated with RPD/PD & O2 Sat Independence with exercise equipment Exercise tolerated well No report of cardiac concerns or symptoms  Goals Unmet:  Not Applicable  Comments: Pt able to follow exercise prescription today without complaint.  Will continue to monitor for progression.    Dr. Emily Filbert is Medical Director for [place] [facility].

## 2020-02-29 ENCOUNTER — Ambulatory Visit (INDEPENDENT_AMBULATORY_CARE_PROVIDER_SITE_OTHER): Payer: Medicare Other | Admitting: Family Medicine

## 2020-02-29 ENCOUNTER — Encounter: Payer: Self-pay | Admitting: Family Medicine

## 2020-02-29 VITALS — BP 152/80 | HR 78 | Temp 98.8°F | Ht 71.0 in | Wt 252.5 lb

## 2020-02-29 DIAGNOSIS — E871 Hypo-osmolality and hyponatremia: Secondary | ICD-10-CM | POA: Diagnosis not present

## 2020-02-29 DIAGNOSIS — I2510 Atherosclerotic heart disease of native coronary artery without angina pectoris: Secondary | ICD-10-CM

## 2020-02-29 DIAGNOSIS — I420 Dilated cardiomyopathy: Secondary | ICD-10-CM | POA: Diagnosis not present

## 2020-02-29 DIAGNOSIS — J4551 Severe persistent asthma with (acute) exacerbation: Secondary | ICD-10-CM

## 2020-02-29 DIAGNOSIS — E785 Hyperlipidemia, unspecified: Secondary | ICD-10-CM

## 2020-02-29 DIAGNOSIS — R7989 Other specified abnormal findings of blood chemistry: Secondary | ICD-10-CM | POA: Diagnosis not present

## 2020-02-29 DIAGNOSIS — R7303 Prediabetes: Secondary | ICD-10-CM

## 2020-02-29 DIAGNOSIS — R5381 Other malaise: Secondary | ICD-10-CM | POA: Diagnosis not present

## 2020-02-29 DIAGNOSIS — F321 Major depressive disorder, single episode, moderate: Secondary | ICD-10-CM

## 2020-02-29 DIAGNOSIS — I10 Essential (primary) hypertension: Secondary | ICD-10-CM | POA: Diagnosis not present

## 2020-02-29 NOTE — Assessment & Plan Note (Signed)
Stable control. 

## 2020-02-29 NOTE — Assessment & Plan Note (Signed)
Followed by Pulm. In cardio pulm rehab.

## 2020-02-29 NOTE — Assessment & Plan Note (Signed)
Followed by Dr Stoioff.   

## 2020-02-29 NOTE — Assessment & Plan Note (Signed)
Plan re-eval with ECHO. Pr Dr.Arida likely false ECHOreading given COPD and OBesity.

## 2020-02-29 NOTE — Patient Instructions (Addendum)
Cut back on cookies and ice cream, carbs.  Keep up with exercise as you are.  Get COVID 19 vaccine when able.

## 2020-02-29 NOTE — Assessment & Plan Note (Signed)
Likely due to [facility] but mild.

## 2020-02-29 NOTE — Assessment & Plan Note (Signed)
Improving with cardiopulmonary rehab

## 2020-02-29 NOTE — Progress Notes (Signed)
Chief Complaint  Patient presents with  . Annual Exam    Part 2    History of Present Illness: HPI  The patient presents for review of chronic health problems. He/She also has the following acute concerns today:  The patient saw a LPN or RN for medicare wellness visit.  Prevention and wellness was reviewed in detail. Note reviewed and important notes copied below.  Health Maintenance: Tdap- insurance/financial Abnormal Screenings: none   02/29/20 He is currently in pulmonary rehab for severe  persitent asthma Followed by Dr. Mortimer Fries.  Hypogonadism followed by Dr. Bernardo Heater [facility]rology  Sleep apnea:  not on CPAP.Marland Kitchen did not tolerate.  SOB and LVH noted on ECHO: evaluated in 02/03/2020 by Dr. Fletcher Anon.   right and left cardiac catheterization in March 2018 which showed mild nonobstructive coronary artery disease, normal LV systolic function and normal right heart catheterization. He underwent cardiac catheterization in 2008 which showed minor luminal irregularities with ejection fraction of 55%.  echocardiogram done in December 2020 which showed an EF of 50 to 55% with moderate left ventricular hypertrophy no significant valvular abnormalities.  ? Cardiomyopathy Repeat ECHO pending to re-eval.   MDD: well controlled on celexa    Pulmonary Rehab from 02/25/2020 in [facility] Cardiac and Pulmonary Rehab  PHQ-2 Total Score  1     Hypertension:    Elevated in office today on lisinopril.. he feels it is up given rushing in.  BP Readings from Last 3 Encounters:  02/29/20 (!) 152/80  02/23/20 131/77  02/03/20 132/82  Using medication without problems or lightheadedness:  occ he feels  This is due to citalopram. Chest pain with exertion:none Edema:none Short of breath: still poor Average home BPs:130/70s Other issues: Wt Readings from Last 3 Encounters:  02/29/20 252 lb 8 oz (114.5 kg)  02/23/20 250 lb (113.4 kg)  02/23/20 250 lb 11.2 oz (113.7 kg)    prediabetes.. borderline  diabetes   Elevated Cholesterol:  At goal on statin Lab Results  Component Value Date   CHOL 141 02/22/2020   HDL 49.30 02/22/2020   LDLCALC 73 02/22/2020   LDLDIRECT 53.0 08/23/2016   TRIG 92.0 02/22/2020   CHOLHDL 3 02/22/2020  Using medications without problems: Muscle aches:  Diet compliance: eating more cookies Exercise: pulmonary rehab Other complaints:    This visit occurred during the SARS-CoV-2 public health emergency.  Safety protocols were in place, including screening questions prior to the visit, additional usage of staff PPE, and extensive cleaning of exam room while observing appropriate contact time as indicated for disinfecting solutions.   COVID 19 screen:  No recent travel or known exposure to COVID19 The patient denies respiratory symptoms of COVID 19 at this time. The importance of social distancing was discussed today.     Review of Systems  Constitutional: Negative for chills and fever.  HENT: Negative for congestion and ear pain.   Eyes: Negative for pain and redness.  Respiratory: Negative for cough and shortness of breath.   Cardiovascular: Negative for chest pain, palpitations and leg swelling.  Gastrointestinal: Negative for abdominal pain, blood in stool, constipation, diarrhea, nausea and vomiting.  Genitourinary: Negative for dysuria.  Musculoskeletal: Negative for falls and myalgias.  Skin: Negative for rash.  Neurological: Negative for dizziness.  Psychiatric/Behavioral: Negative for depression. The patient is not nervous/anxious.       Past Medical History:  Diagnosis Date  . Achilles tendon rupture 10/11   and repair  . Adenomatous polyp of colon 04/06/2010   Adenomatous polyps  3 in the descending colon, times one in the ascending colon [facility])  . Asthma   . COPD (chronic obstructive pulmonary disease) ([place])   . Gout   . Hemorrhoids   . Hypercholesteremia   . Hypertension   . Sleep apnea    doesn't wear CPAP  .  Syncope 10/11   in settin gof asthma exacerbation w coughing. Ech (10/11): EF > 55%, mild LVH, grade I diastolic dysfunction, nomral RV size and systolic function,. normal valves. Carotid US (10/11): minimal disease    reports that he quit smoking about 24 years ago. His smoking use included cigarettes. He has a 8.75 pack-year smoking history. His smokeless tobacco use includes chew. He reports previous alcohol use. He reports that he does not use drugs.   Current Outpatient Medications:  .  albuterol (VENTOLIN HFA) 108 (90 Base) MCG/ACT inhaler, Inhale 2 puffs into the lungs every 4 (four) hours as needed for wheezing or shortness of breath., Disp: 1 Inhaler, Rfl: 10 .  allopurinol (ZYLOPRIM) 300 MG tablet, TAKE 1 TABLET BY MOUTH DAILY, Disp: 90 tablet, Rfl: 0 .  Ascorbic Acid (VITAMIN C) 1000 MG tablet, Take 1,000 mg by mouth daily., Disp: , Rfl:  .  Cholecalciferol (VITAMIN D-3) 125 MCG (5000 UT) TABS, Take 1 tablet by mouth daily., Disp: , Rfl:  .  citalopram (CELEXA) 40 MG tablet, Take 1 tablet (40 mg total) by mouth daily., Disp: 90 tablet, Rfl: 1 .  fluticasone (FLOVENT HFA) 220 MCG/ACT inhaler, Inhale 2 puffs into the lungs 2 (two) times daily., Disp: 1 Inhaler, Rfl: 5 .  lisinopril (ZESTRIL) 20 MG tablet, TAKE 1 TABLET BY MOUTH DAILY, Disp: 90 tablet, Rfl: 0 .  montelukast (SINGULAIR) 10 MG tablet, TAKE 1 TABLET BY MOUTH DAILY, Disp: 90 tablet, Rfl: 0 .  Multiple Vitamin (MULTIVITAMIN WITH MINERALS) TABS tablet, Take 1 tablet by mouth daily., Disp: 30 tablet, Rfl: 2 .  Multiple Vitamins-Minerals (ZINC PO), Take 1 tablet by mouth daily., Disp: , Rfl:  .  simvastatin (ZOCOR) 20 MG tablet, TAKE 1 TABLET BY MOUTH DAILY, Disp: 90 tablet, Rfl: 3 .  STIOLTO RESPIMAT 2.5-2.5 MCG/ACT AERS, USE 2 INHALATIONS EVERY DAY, Disp: 4 g, Rfl: 5 .  testosterone cypionate (DEPOTESTOSTERONE CYPIONATE) 200 MG/ML injection, INJECT 0.3 ML (60 MG TOTAL) INTO THE MUSCLE ONCE A WEEK, Disp: 10 mL, Rfl: 0    Observations/Objective: Blood pressure (!) 152/80, pulse 78, temperature 98.8 F (37.1 C), temperature source Temporal, height 5\' 11"  (1.803 m), weight 252 lb 8 oz (114.5 kg), SpO2 98 %.  Physical Exam Constitutional:      General: He is not in acute distress.    Appearance: Normal appearance. He is well-developed. He is obese. He is not ill-appearing or toxic-appearing.  HENT:     Head: Normocephalic and atraumatic.     Right Ear: Hearing, tympanic membrane, ear canal and external ear normal.     Left Ear: Hearing, tympanic membrane, ear canal and external ear normal.     Nose: Nose normal.     Mouth/Throat:     Pharynx: Uvula midline.  Eyes:     General: Lids are normal. Lids are everted, no foreign bodies appreciated.     Conjunctiva/sclera: Conjunctivae normal.     Pupils: Pupils are equal, round, and reactive to light.  Neck:     Thyroid: No thyroid mass or thyromegaly.     Vascular: No carotid bruit.     Trachea: Trachea and phonation normal.  Cardiovascular:  Rate and Rhythm: Normal rate and regular rhythm.     Pulses: Normal pulses.     Heart sounds: S1 normal and S2 normal. No murmur. No gallop.      Comments: Bilateral varicose veins, 1 plus nonpitting edema bialterally Pulmonary:     Breath sounds: Normal breath sounds. No wheezing, rhonchi or rales.  Abdominal:     General: Bowel sounds are normal.     Palpations: Abdomen is soft.     Tenderness: There is no abdominal tenderness. There is no guarding or rebound.     Hernia: No hernia is present.  Musculoskeletal:     Cervical back: Normal range of motion and neck supple.  Lymphadenopathy:     Cervical: No cervical adenopathy.  Skin:    General: Skin is warm and dry.     Findings: No rash.  Neurological:     General: No focal deficit present.     Mental Status: He is alert.     Cranial Nerves: Cranial nerves are intact. No cranial nerve deficit.     Sensory: Sensation is intact. No sensory deficit.      Motor: Tremor present.     Gait: Gait normal.     Deep Tendon Reflexes: Reflexes are normal and symmetric.  Psychiatric:        Speech: Speech normal.        Behavior: Behavior normal.        Judgment: Judgment normal.      Assessment and Plan The patient's preventative maintenance and recommended screening tests for an annual wellness exam were reviewed in full today. Brought up to date unless services declined.  Counselled on the importance of diet, exercise, and its role in overall health and mortality. The patient's FH and SH was reviewed, including their home life, tobacco status, and drug and alcohol status.   Vaccines:uptodate  Discussed COVID19 vaccine side effects and benefits. Strongly encouraged the patient to get the vaccine. Questions answered. Prostate Cancer Screen:stable PSA  Lab Results  Component Value Date   PSA1 0.3 02/18/2020   PSA1 1.8 04/29/2019   PSA1 1.5 03/16/2019   PSA 0.49 07/14/2015   PSA 0.48 12/20/2013   PSA 0.34 11/24/2012  Colon Cancer Screen: 08/2015 colonoscopy.. repeat in 10 years      Smoking Status:nonsmoker ETOH/ drug [id number] Hep C: neg HIV screen: neg    Prediabetes Now at threshold of diabetes. Contiue rehab and lower carbs in diet.  Physical deconditioning Improving with cardiopulmonary rehab  MDD (major depressive disorder), single episode, moderate (HCC) Stable control.  Low testosterone Followed by Dr. Bernardo Heater  Hyperlipidemia LDL goal <70 Well controlled. Continue current medication.   Hyponatremia Likely due to [facility] but mild.  Chronic asthma Followed by Pulm. In cardio pulm rehab.  Cardiomyopathy Plan re-eval with ECHO. Pr Dr.Arida likely false ECHOreading given COPD and OBesity.     Eliezer Lofts, MD

## 2020-02-29 NOTE — Assessment & Plan Note (Signed)
Well controlled. Continue current medication.  

## 2020-02-29 NOTE — Assessment & Plan Note (Signed)
Now at threshold of diabetes. Contiue rehab and lower carbs in diet.

## 2020-03-01 ENCOUNTER — Other Ambulatory Visit: Payer: Self-pay

## 2020-03-01 ENCOUNTER — Encounter: Payer: Medicare Other | Admitting: *Deleted

## 2020-03-01 DIAGNOSIS — J4550 Severe persistent asthma, uncomplicated: Secondary | ICD-10-CM

## 2020-03-01 DIAGNOSIS — G4730 Sleep apnea, unspecified: Secondary | ICD-10-CM | POA: Diagnosis not present

## 2020-03-01 DIAGNOSIS — E7800 Pure hypercholesterolemia, unspecified: Secondary | ICD-10-CM | POA: Diagnosis not present

## 2020-03-01 DIAGNOSIS — I10 Essential (primary) hypertension: Secondary | ICD-10-CM | POA: Diagnosis not present

## 2020-03-01 DIAGNOSIS — Z79899 Other long term (current) drug therapy: Secondary | ICD-10-CM | POA: Diagnosis not present

## 2020-03-01 DIAGNOSIS — J449 Chronic obstructive pulmonary disease, unspecified: Secondary | ICD-10-CM | POA: Diagnosis not present

## 2020-03-01 NOTE — Patient Instructions (Signed)
Discharge Patient Instructions  Patient Details  Name: Cody Day MRN: [id number] Date of Birth: 12-Oct-1956 Referring Provider:  Flora Lipps, MD   Number of Visits: 78  Reason for Discharge:  Patient reached a stable level of exercise. Patient independent in their exercise. Patient has met program and personal goals.  Smoking History:  Social History   Tobacco Use  Smoking Status Former Smoker  . Packs/day: 0.25  . Years: 35.00  . Pack years: 8.75  . Types: Cigarettes  . Quit date: 10/22/1995  . Years since quitting: 24.3  Smokeless Tobacco Current User  . Types: Chew  Tobacco Comment   rarely smoked cigarettes and cigars.  states he smoked drugs mostly    Diagnosis:  Severe persistent chronic asthma without complication  Initial Exercise Prescription: Initial Exercise Prescription - 10/05/19 1400      Date of Initial Exercise RX and Referring Provider   Date  10/05/19    Referring Provider  [name] MD      Treadmill   MPH  1.6    Grade  0.5    Minutes  15    METs  2.34      NuStep   Level  2    SPM  80    Minutes  15    METs  2      REL-XR   Level  1    Speed  50    Minutes  15    METs  2      T5 Nustep   Level  2    SPM  80    Minutes  15    METs  2      Prescription Details   Frequency (times per week)  3    Duration  Progress to 30 minutes of continuous aerobic without signs/symptoms of physical distress      Intensity   THRR 40-80% of Max Heartrate  117-144    Ratings of Perceived Exertion  11-13    Perceived Dyspnea  0-4      Progression   Progression  Continue to progress workloads to maintain intensity without signs/symptoms of physical distress.      Resistance Training   Training Prescription  Yes    Weight  3 lb    Reps  10-15       Discharge Exercise Prescription (Final Exercise Prescription Changes): Exercise Prescription Changes - 02/22/20 1500      Response to Exercise   Blood Pressure (Admit)  136/70     Blood Pressure (Exercise)  134/74    Blood Pressure (Exit)  126/70    Heart Rate (Admit)  75 bpm    Heart Rate (Exercise)  101 bpm    Heart Rate (Exit)  83 bpm    Oxygen Saturation (Admit)  98 %    Oxygen Saturation (Exercise)  96 %    Oxygen Saturation (Exit)  96 %    Rating of Perceived Exertion (Exercise)  13    Perceived Dyspnea (Exercise)  3    Symptoms  SOB    Duration  Continue with 30 min of aerobic exercise without signs/symptoms of physical distress.    Intensity  THRR unchanged      Progression   Progression  Continue to progress workloads to maintain intensity without signs/symptoms of physical distress.    Average METs  3.65      Resistance Training   Training Prescription  Yes    Weight  6 lb  Reps  10-15      Interval Training   Interval Training  No      Treadmill   MPH  2.7    Grade  0    Minutes  15    METs  3.07      Recumbant Bike   Level  8    Watts  53    Minutes  15    METs  3.51      NuStep   Level  5    Minutes  15    METs  5      REL-XR   Level  5    Minutes  15      Home Exercise Plan   Plans to continue exercise at  Home (comment)   walking   Frequency  Add 2 additional days to program exercise sessions.    Initial Home Exercises Provided  12/02/19       Functional Capacity: 6 Minute Walk    Row Name 10/05/19 1441 02/23/20 0941       6 Minute Walk   Phase  Initial  Discharge    Distance  994 feet  1190 feet    Distance % Change  --  19.7 %    Distance Feet Change  --  196 ft    Walk Time  6 minutes  6 minutes    # of Rest Breaks  0  0    MPH  1.88  2.25    METS  2.82  2.82    RPE  15  13    Perceived Dyspnea   3  3    VO2 Peak  9.88  9.89    Symptoms  Yes (comment)  Yes (comment)    Comments  hip pain 3/10, SOB, knee pain 3/10, SOB  SOB    Resting HR  90 bpm  87 bpm    Resting BP  112/64  130/80    Resting Oxygen Saturation   97 %  98 %    Exercise Oxygen Saturation  during 6 min walk  95 %  96 %    Max Ex. HR   125 bpm  111 bpm    Max Ex. BP  146/64  136/64    2 Minute Post BP  134/70  134/70      Interval HR   1 Minute HR  99  99    2 Minute HR  119  96    3 Minute HR  114  100    4 Minute HR  102  103    5 Minute HR  125  111    6 Minute HR  98  110    2 Minute Post HR  92  93    Interval Heart Rate?  Yes  Yes      Interval Oxygen   Interval Oxygen?  Yes  Yes    Baseline Oxygen Saturation %  97 %  98 %    1 Minute Oxygen Saturation %  96 %  98 %    1 Minute Liters of Oxygen  0 L Room Air  0 L    2 Minute Oxygen Saturation %  95 %  98 %    2 Minute Liters of Oxygen  0 L  0 L    3 Minute Oxygen Saturation %  96 %  97 %    3 Minute Liters of Oxygen  0 L  0 L  4 Minute Oxygen Saturation %  95 %  96 %    4 Minute Liters of Oxygen  0 L  0 L    5 Minute Oxygen Saturation %  95 %  98 %    5 Minute Liters of Oxygen  0 L  0 L    6 Minute Oxygen Saturation %  96 %  97 %    6 Minute Liters of Oxygen  0 L  0 L    2 Minute Post Oxygen Saturation %  97 %  100 %    2 Minute Post Liters of Oxygen  0 L  0 L       Quality of Life:   Personal Goals: Goals established at orientation with interventions provided to work toward goal. Personal Goals and Risk Factors at Admission - 10/05/19 1459      Core Components/Risk Factors/Patient Goals on Admission    Weight Management  Yes;Weight Loss    Intervention  Weight Management: Develop a combined nutrition and exercise program designed to reach desired caloric intake, while maintaining appropriate intake of nutrient and fiber, sodium and fats, and appropriate energy expenditure required for the weight goal.;Weight Management: Provide education and appropriate resources to help participant work on and attain dietary goals.;Weight Management/Obesity: Establish reasonable short term and long term weight goals.;Obesity: Provide education and appropriate resources to help participant work on and attain dietary goals.    Admit Weight  244 lb 11.2 oz (111  kg)    Goal Weight: Short Term  235 lb (106.6 kg)    Goal Weight: Long Term  230 lb (104.3 kg)    Expected Outcomes  Short Term: Continue to assess and modify interventions until short term weight is achieved;Weight Maintenance: Understanding of the daily nutrition guidelines, which includes 25-35% calories from fat, 7% or less cal from saturated fats, less than 254m cholesterol, less than 1.5gm of sodium, & 5 or more servings of fruits and vegetables daily;Weight Loss: Understanding of general recommendations for a balanced deficit meal plan, which promotes 1-2 lb weight loss per week and includes a negative energy balance of [phone] kcal/d;Understanding recommendations for meals to include 15-35% energy as protein, 25-35% energy from fat, 35-60% energy from carbohydrates, less than 2073mof dietary cholesterol, 20-35 gm of total fiber daily;Understanding of distribution of calorie intake throughout the day with the consumption of 4-5 meals/snacks;Long Term: Adherence to nutrition and physical activity/exercise program aimed toward attainment of established weight goal    Tobacco Cessation  Yes    Intervention  Assist the participant in steps to quit. Provide individualized education and counseling about committing to Tobacco Cessation, relapse prevention, and pharmacological support that can be provided by physician.;OfAdvice workerassist with locating and accessing local/national Quit Smoking programs, and support quit date choice.    Expected Outcomes  Short Term: Will demonstrate readiness to quit, by selecting a quit date.;Long Term: Complete abstinence from all tobacco products for at least 12 months from quit date.;Short Term: Will quit all tobacco product use, adhering to prevention of relapse plan.    Improve shortness of breath with ADL's  Yes    Intervention  Provide education, individualized exercise plan and daily activity instruction to help decrease symptoms of SOB with  activities of daily living.    Expected Outcomes  Short Term: Improve cardiorespiratory fitness to achieve a reduction of symptoms when performing ADLs;Long Term: Be able to perform more ADLs without symptoms or delay the onset of symptoms  Hypertension  Yes    Intervention  Provide education on lifestyle modifcations including regular physical activity/exercise, weight management, moderate sodium restriction and increased consumption of fresh fruit, vegetables, and low fat dairy, alcohol moderation, and smoking cessation.;Monitor prescription use compliance.    Expected Outcomes  Short Term: Continued assessment and intervention until BP is < 140/65m HG in hypertensive participants. < 130/851mHG in hypertensive participants with diabetes, heart failure or chronic kidney disease.;Long Term: Maintenance of blood pressure at goal levels.    Lipids  Yes    Intervention  Provide education and support for participant on nutrition & aerobic/resistive exercise along with prescribed medications to achieve LDL <7011mHDL >59m39m  Expected Outcomes  Short Term: Participant states understanding of desired cholesterol values and is compliant with medications prescribed. Participant is following exercise prescription and nutrition guidelines.;Long Term: Cholesterol controlled with medications as prescribed, with individualized exercise RX and with personalized nutrition plan. Value goals: LDL < 70mg7mL > 40 mg.        Personal Goals Discharge: Goals and Risk Factor Review - 01/17/20 0838      Core Components/Risk Factors/Patient Goals Review   Personal Goals Review  Weight Management/Obesity;Improve shortness of breath with ADL's    Review  Patients weight is up since the start of the program. He wants to work on weight loss. His shortness of breath has been a little worse when showering and getting ready for the day. He is feeling of in general but gets short of breath when doing certain activities.     Expected Outcomes  Short: Attend [facility] regularly to improve shortness of breath with ADL's. Long: maintain independence with ADL's       Exercise Goals and Review: Exercise Goals    Row Name 10/05/19 1455             Exercise Goals   Increase Physical Activity  Yes       Intervention  Provide advice, education, support and counseling about physical activity/exercise needs.;Develop an individualized exercise prescription for aerobic and resistive training based on initial evaluation findings, risk stratification, comorbidities and participant's personal goals.       Expected Outcomes  Short Term: Attend rehab on a regular basis to increase amount of physical activity.;Long Term: Add in home exercise to make exercise part of routine and to increase amount of physical activity.;Long Term: Exercising regularly at least 3-5 days a week.       Increase Strength and Stamina  Yes       Intervention  Provide advice, education, support and counseling about physical activity/exercise needs.;Develop an individualized exercise prescription for aerobic and resistive training based on initial evaluation findings, risk stratification, comorbidities and participant's personal goals.       Expected Outcomes  Short Term: Increase workloads from initial exercise prescription for resistance, speed, and METs.;Short Term: Perform resistance training exercises routinely during rehab and add in resistance training at home;Long Term: Improve cardiorespiratory fitness, muscular endurance and strength as measured by increased METs and functional capacity (6MWT)       Able to understand and use rate of perceived exertion (RPE) scale  Yes       Intervention  Provide education and explanation on how to use RPE scale       Expected Outcomes  Long Term:  Able to use RPE to guide intensity level when exercising independently;Short Term: Able to use RPE daily in rehab to express subjective intensity level  Able to  understand and use Dyspnea scale  Yes       Intervention  Provide education and explanation on how to use Dyspnea scale       Expected Outcomes  Short Term: Able to use Dyspnea scale daily in rehab to express subjective sense of shortness of breath during exertion;Long Term: Able to use Dyspnea scale to guide intensity level when exercising independently       Knowledge and understanding of Target Heart Rate Range (THRR)  Yes       Intervention  Provide education and explanation of THRR including how the numbers were predicted and where they are located for reference       Expected Outcomes  Short Term: Able to state/look up THRR;Short Term: Able to use daily as guideline for intensity in rehab;Long Term: Able to use THRR to govern intensity when exercising independently       Able to check pulse independently  Yes       Intervention  Provide education and demonstration on how to check pulse in carotid and radial arteries.;Review the importance of being able to check your own pulse for safety during independent exercise       Expected Outcomes  Short Term: Able to explain why pulse checking is important during independent exercise;Long Term: Able to check pulse independently and accurately       Understanding of Exercise Prescription  Yes       Intervention  Provide education, explanation, and written materials on patient's individual exercise prescription       Expected Outcomes  Short Term: Able to explain program exercise prescription;Long Term: Able to explain home exercise prescription to exercise independently          Exercise Goals Re-Evaluation: Exercise Goals Re-Evaluation    Row Name 10/06/19 0857 10/08/19 0920 10/21/19 1344 11/05/19 1006 12/01/19 1049     Exercise Goal Re-Evaluation   Exercise Goals Review  Able to understand and use rate of perceived exertion (RPE) scale;Able to understand and use Dyspnea scale;Knowledge and understanding of Target Heart Rate Range  (THRR);Understanding of Exercise Prescription  Able to understand and use rate of perceived exertion (RPE) scale;Knowledge and understanding of Target Heart Rate Range (THRR);Understanding of Exercise Prescription  Able to understand and use rate of perceived exertion (RPE) scale;Able to understand and use Dyspnea scale;Knowledge and understanding of Target Heart Rate Range (THRR);Able to check pulse independently;Understanding of Exercise Prescription  Increase Physical Activity;Increase Strength and Stamina;Understanding of Exercise Prescription  Increase Physical Activity;Increase Strength and Stamina;Understanding of Exercise Prescription   Comments  Reviewed RPE and dyspnea scale, THR and program prescription with pt today.  Pt voiced understanding and was given a copy of goals to take home.  Reviewed RPE scale, THR and program prescription with pt today.  Pt voiced understanding and was given a copy of goals to take home.  Cody Day is tolerating exercise well and will work on Catering manager on TM.  Staff will monitor progress.  Cody Day has been doing well with rehab.  He has been holding steady at 2.1 METS on T5 NuStep and 2.8 METs on XR.  We will start to move up his workloads and continue to montior his progress.  Cody Day has been doing well in rehab.  He reduced his workloads some yesterday as he had been out sick the previous week.  He is glad to be back to class.  We will continue to monitor his progress.   Expected Outcomes  Short: Use  RPE and dyspnea scales daily to regulate intensity. Long: Follow program prescription in THR.  Short: Use RPE daily to regulate intensity. Long: Follow program prescription in THR.  Short - exercise consistently Long - increase overall stamina  Short: Increase workloads. Long: Continue to improve stamina.  Short: Return to regular attendance.  Long: Continue to improve strength and stamina.   [place] Name 12/02/19 9604 12/14/19 1456 12/29/19 1325 01/13/20 1320 01/28/20 1335      Exercise Goal Re-Evaluation   Exercise Goals Review  Increase Physical Activity;Increase Strength and Stamina;Understanding of Exercise Prescription  Increase Physical Activity;Increase Strength and Stamina;Able to understand and use rate of perceived exertion (RPE) scale;Able to understand and use Dyspnea scale;Knowledge and understanding of Target Heart Rate Range (THRR);Able to check pulse independently;Understanding of Exercise Prescription  --  Increase Physical Activity;Increase Strength and Stamina;Able to understand and use rate of perceived exertion (RPE) scale;Able to understand and use Dyspnea scale;Knowledge and understanding of Target Heart Rate Range (THRR);Able to check pulse independently;Understanding of Exercise Prescription  Increase Physical Activity;Increase Strength and Stamina;Able to understand and use rate of perceived exertion (RPE) scale;Able to understand and use Dyspnea scale;Knowledge and understanding of Target Heart Rate Range (THRR);Able to check pulse independently;Understanding of Exercise Prescription   Comments  Cody Day is doing well in rehab.  His exercise at home has been chasing his 4 and 55 yo grandkids.  Reviewed home exercise with pt today.  Pt plans to walking at track at school for exercise.  Reviewed THR, pulse, RPE, sign and symptoms, and when to call 911 or MD.  Also discussed weather considerations and indoor options.  Pt voiced understanding.  Cody Day is back up to level 8 on RB.   He works at level 5 on [place].  He will progress more now that we are back to 2-3 days per week.  Out since last review with COVID-19  This is Arts first week back after being out.  He tolerated exercise well.  Cody Day was at RPE 9 on bike - we discussed increasing resistance or RPM to be more challenging.   Expected Outcomes  Short: Walk consistently one extra day a week.  Long: Continue to improve stamina.  Short : exercsie consistently 3 days per week Long : increase MET level  --  Short:  attend  consistently Long: improve overall stamina  Short : continue to work in challenging RPE 11-15 Long: increase overall MET level   Row Name 02/09/20 1307 02/22/20 1500 02/23/20 0946         Exercise Goal Re-Evaluation   Exercise Goals Review  Increase Physical Activity;Increase Strength and Stamina;Able to understand and use rate of perceived exertion (RPE) scale;Able to understand and use Dyspnea scale;Knowledge and understanding of Target Heart Rate Range (THRR);Able to check pulse independently;Understanding of Exercise Prescription  Increase Physical Activity;Increase Strength and Stamina;Understanding of Exercise Prescription  Increase Physical Activity;Increase Strength and Stamina;Understanding of Exercise Prescription     Comments  Cody Day has been working at RPE 11-13 and uses 6 lb weights for strength work.  Cody Day has officailly switched classes to earlier morning as he finishes up.  We expect to see an improvement in his post 6MWT.  He has taken to the BioStep very well.  He is planning to continue to exercise by walking at home.  Cody Day improved post 6MWT by 19.7 %!!  He is planning to continue to walk for exericse at home.     Expected Outcomes  Short:  continue to attend  consistently Long:continue to build stamina  Short: Improve post 6MWT  Long: Continue to exercise independently  Short: Graduate!!  Long: Continue to exercise indenpendently.        Nutrition & Weight - Outcomes: Pre Biometrics - 10/05/19 1455      Pre Biometrics   Height  5' 11.1" (1.806 m)    Weight  244 lb 11.2 oz (111 kg)    BMI (Calculated)  34.03    Single Leg Stand  3.12 seconds      Post Biometrics - 02/23/20 0944       Post  Biometrics   Height  5' 11.1" (1.806 m)    Weight  250 lb 11.2 oz (113.7 kg)    BMI (Calculated)  34.87       Nutrition: Nutrition Therapy & Goals - 11/18/19 1401      Nutrition Therapy   Diet  Low Na, HH, DM    Protein (specify units)  90g    Fiber  30 grams    Whole Grain Foods   3 servings    Saturated Fats  12 max. grams    Fruits and Vegetables  5 servings/day    Sodium  1.5 grams      Personal Nutrition Goals   Nutrition Goal  ST: Add vegetables into burrito LT: like to get back to shape he was in 5 years ago, improve breathing and SOB    Comments  A1c. Pt using stevia for sweet tea instead of sugar. ~36 eggs/week. 2 scrambled egg burrito daily. May have peanut butter sandwich or some cookies. Sometimes have pasta salad with olive apple. Pt reports not salting anything. 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 Pt reports also includes avocado or berries and other fruit. Eats roasted unsalted nuts. Discussed HH eating. Suggested adding vegetables to burritos and making burritos whole wheat. Pt chose adding vegetables.      Intervention Plan   Intervention  Prescribe, educate and counsel regarding individualized specific dietary modifications aiming towards targeted core components such as weight, hypertension, lipid management, diabetes, heart failure and other comorbidities.;Nutrition handout(s) given to patient.    Expected Outcomes  Short Term Goal: Understand basic principles of dietary content, such as calories, fat, sodium, cholesterol and nutrients.;Short Term Goal: A plan has been developed with personal nutrition goals set during dietitian appointment.;Long Term Goal: Adherence to prescribed nutrition plan.       Nutrition Discharge: Nutrition Assessments - 02/25/20 0940      MEDFICTS Scores   Pre Score  77    Post Score  71    Score Difference  -6       Education Questionnaire Score: Knowledge Questionnaire Score - 02/25/20 0940      Knowledge Questionnaire Score   Pre Score  16/18 education focus: O2 safety, ADLs    Post Score  15/18       Goals reviewed with patient; copy given to patient.

## 2020-03-01 NOTE — Progress Notes (Signed)
Daily Session Note  Patient Details  Name: Cody Day MRN: [id number] Date of Birth: Jan 29, 1956 Referring Provider:     Pulmonary Rehab from 10/05/2019 in [facility]  Referring Provider  [name] MD      Encounter Date: 03/01/2020  Check In: Session Check In - 03/01/20 0809      Check-In   Supervising physician immediately available to respond to emergencies  See telemetry face sheet for immediately available ER MD    Location  [facility]-Cardiac & Pulmonary Rehab    Staff Present  Heath Lark, RN, BSN, CCRP;Amanda Sommer, BA, ACSM CEP, Exercise Physiologist;Joseph Hood RCP,RRT,BSRT;Jessica Corralitos, [place], RCEP, CCRP, CCET    Virtual Visit  No    Medication changes reported      No    Fall or balance concerns reported     No    Warm-up and Cool-down  Performed on first and last piece of equipment    Resistance Training Performed  Yes    VAD Patient?  No    PAD/SET Patient?  No      Pain Assessment   Currently in Pain?  No/denies          Social History   Tobacco Use  Smoking Status Former Smoker  . Packs/day: 0.25  . Years: 35.00  . Pack years: 8.75  . Types: Cigarettes  . Quit date: 10/22/1995  . Years since quitting: 24.3  Smokeless Tobacco Current User  . Types: Chew  Tobacco Comment   rarely smoked cigarettes and cigars.  states he smoked drugs mostly    Goals Met:  Proper associated with RPD/PD & O2 Sat Independence with exercise equipment Exercise tolerated well No report of cardiac concerns or symptoms  Goals Unmet:  Not Applicable  Comments: Pt able to follow exercise prescription today without complaint.  Will continue to monitor for progression.    Dr. Emily Filbert is Medical Director for [facility] and [facility].

## 2020-03-03 ENCOUNTER — Encounter: Payer: Medicare Other | Admitting: *Deleted

## 2020-03-03 ENCOUNTER — Other Ambulatory Visit: Payer: Self-pay

## 2020-03-03 DIAGNOSIS — G4730 Sleep apnea, unspecified: Secondary | ICD-10-CM | POA: Diagnosis not present

## 2020-03-03 DIAGNOSIS — J4550 Severe persistent asthma, uncomplicated: Secondary | ICD-10-CM | POA: Diagnosis not present

## 2020-03-03 DIAGNOSIS — E7800 Pure hypercholesterolemia, unspecified: Secondary | ICD-10-CM | POA: Diagnosis not present

## 2020-03-03 DIAGNOSIS — J449 Chronic obstructive pulmonary disease, unspecified: Secondary | ICD-10-CM | POA: Diagnosis not present

## 2020-03-03 DIAGNOSIS — Z79899 Other long term (current) drug therapy: Secondary | ICD-10-CM | POA: Diagnosis not present

## 2020-03-03 DIAGNOSIS — I10 Essential (primary) hypertension: Secondary | ICD-10-CM | POA: Diagnosis not present

## 2020-03-03 NOTE — Progress Notes (Signed)
Cardiac Individual Treatment Plan  Patient Details  Name: Cody Day MRN: [id number] Date of Birth: May 01, 1956 Referring Provider:     Pulmonary Rehab from 10/05/2019 in [facility] Cardiac and Pulmonary Rehab  Referring Provider  [name] MD      Initial Encounter Date:    Pulmonary Rehab from 10/05/2019 in [facility] Cardiac and Pulmonary Rehab  Date  10/05/19      Visit Diagnosis: Severe persistent chronic asthma without complication  Patient's Home Medications on Admission:  Current Outpatient Medications:  .  albuterol (VENTOLIN HFA) 108 (90 Base) MCG/ACT inhaler, Inhale 2 puffs into the lungs every 4 (four) hours as needed for wheezing or shortness of breath., Disp: 1 Inhaler, Rfl: 10 .  allopurinol (ZYLOPRIM) 300 MG tablet, TAKE 1 TABLET BY MOUTH DAILY, Disp: 90 tablet, Rfl: 0 .  Ascorbic Acid (VITAMIN C) 1000 MG tablet, Take 1,000 mg by mouth daily., Disp: , Rfl:  .  Cholecalciferol (VITAMIN D-3) 125 MCG (5000 UT) TABS, Take 1 tablet by mouth daily., Disp: , Rfl:  .  citalopram (CELEXA) 40 MG tablet, Take 1 tablet (40 mg total) by mouth daily., Disp: 90 tablet, Rfl: 1 .  fluticasone (FLOVENT HFA) 220 MCG/ACT inhaler, Inhale 2 puffs into the lungs 2 (two) times daily., Disp: 1 Inhaler, Rfl: 5 .  lisinopril (ZESTRIL) 20 MG tablet, TAKE 1 TABLET BY MOUTH DAILY, Disp: 90 tablet, Rfl: 0 .  montelukast (SINGULAIR) 10 MG tablet, TAKE 1 TABLET BY MOUTH DAILY, Disp: 90 tablet, Rfl: 0 .  Multiple Vitamin (MULTIVITAMIN WITH MINERALS) TABS tablet, Take 1 tablet by mouth daily., Disp: 30 tablet, Rfl: 2 .  Multiple Vitamins-Minerals (ZINC PO), Take 1 tablet by mouth daily., Disp: , Rfl:  .  simvastatin (ZOCOR) 20 MG tablet, TAKE 1 TABLET BY MOUTH DAILY, Disp: 90 tablet, Rfl: 3 .  STIOLTO RESPIMAT 2.5-2.5 MCG/ACT AERS, USE 2 INHALATIONS EVERY DAY, Disp: 4 g, Rfl: 5 .  testosterone cypionate (DEPOTESTOSTERONE CYPIONATE) 200 MG/ML injection, INJECT 0.3 ML (60 MG TOTAL) INTO THE MUSCLE ONCE A WEEK,  Disp: 10 mL, Rfl: 0  Past Medical History: Past Medical History:  Diagnosis Date  . Achilles tendon rupture 10/11   and repair  . Adenomatous polyp of colon 04/06/2010   Adenomatous polyps 3 in the descending colon, times one in the ascending colon [facility])  . Asthma   . COPD (chronic obstructive pulmonary disease) ([place])   . Gout   . Hemorrhoids   . Hypercholesteremia   . Hypertension   . Sleep apnea    doesn't wear CPAP  . Syncope 10/11   in settin gof asthma exacerbation w coughing. Ech (10/11): EF > 55%, mild LVH, grade I diastolic dysfunction, nomral RV size and systolic function,. normal valves. Carotid US (10/11): minimal disease    Tobacco Use: Social History   Tobacco Use  Smoking Status Former Smoker  . Packs/day: 0.25  . Years: 35.00  . Pack years: 8.75  . Types: Cigarettes  . Quit date: 10/22/1995  . Years since quitting: 24.3  Smokeless Tobacco Current User  . Types: Chew  Tobacco Comment   rarely smoked cigarettes and cigars.  states he smoked drugs mostly    Labs: Recent Review Flowsheet Data    Labs for ITP Cardiac and Pulmonary Rehab Latest Ref Rng & Units 04/01/2018 07/20/2018 02/15/2019 08/30/2019 02/22/2020   Cholestrol 0 - 200 mg/dL 168 - 136 123 141   LDLCALC 0 - 99 mg/dL 75 - 71 57 73  LDLDIRECT mg/dL - - - - -   HDL >39.00 mg/dL 81.60 - 44.50 51.00 49.30   Trlycerides 0.0 - 149.0 mg/dL 58.0 - 103.0 76.0 92.0   Hemoglobin A1c 4.6 - 6.5 % 6.1 5.7(H) 6.8(H) 6.3 6.5   TCO2 0 - 100 mmol/L - - - - -       Exercise Target Goals: Exercise Program Goal: Individual exercise prescription set using results from initial 6 min walk test and THRR while considering  patient's activity barriers and safety.   Exercise Prescription Goal: Initial exercise prescription builds to 30-45 minutes a day of aerobic activity, 2-3 days per week.  Home exercise guidelines will be given to patient during program as part of exercise prescription that the participant  will acknowledge.   Education: Aerobic Exercise & Resistance Training: - Gives group verbal and written instruction on the various components of exercise. Focuses on aerobic and resistive training programs and the benefits of this training and how to safely progress through these programs..   Education: Exercise & Equipment Safety: - Individual verbal instruction and demonstration of equipment use and safety with use of the equipment.   Pulmonary Rehab from 10/05/2019 in [facility] Cardiac and Pulmonary Rehab  Date  10/05/19  Educator  [facility]  Instruction Review Code  1- Verbalizes Understanding      Education: Exercise Physiology & General Exercise Guidelines: - Group verbal and written instruction with models to review the exercise physiology of the cardiovascular system and associated critical values. Provides general exercise guidelines with specific guidelines to those with heart or lung disease.    Education: Flexibility, Balance, Mind/Body Relaxation: Provides group verbal/written instruction on the benefits of flexibility and balance training, including mind/body exercise modes such as yoga, pilates and tai chi.  Demonstration and skill practice provided.   Activity Barriers & Risk Stratification: Activity Barriers & Cardiac Risk Stratification - 10/05/19 1451      Activity Barriers & Cardiac Risk Stratification   Activity Barriers  Deconditioning;Muscular Weakness;Other (comment);Shortness of Breath;Balance Concerns    Comments  shuffles feet, tremors       6 Minute Walk: 6 Minute Walk    Row Name 10/05/19 1441 02/23/20 0941       6 Minute Walk   Phase  Initial  Discharge    Distance  994 feet  1190 feet    Distance % Change  -  19.7 %    Distance Feet Change  -  196 ft    Walk Time  6 minutes  6 minutes    # of Rest Breaks  0  0    MPH  1.88  2.25    METS  2.82  2.82    RPE  15  13    Perceived Dyspnea   3  3    VO2 Peak  9.88  9.89    Symptoms  Yes (comment)  Yes  (comment)    Comments  hip pain 3/10, SOB, knee pain 3/10, SOB  SOB    Resting HR  90 bpm  87 bpm    Resting BP  112/64  130/80    Resting Oxygen Saturation   97 %  98 %    Exercise Oxygen Saturation  during 6 min walk  95 %  96 %    Max Ex. HR  125 bpm  111 bpm    Max Ex. BP  146/64  136/64    2 Minute Post BP  134/70  134/70  Interval HR   1 Minute HR  99  99    2 Minute HR  119  96    3 Minute HR  114  100    4 Minute HR  102  103    5 Minute HR  125  111    6 Minute HR  98  110    2 Minute Post HR  92  93    Interval Heart Rate?  Yes  Yes      Interval Oxygen   Interval Oxygen?  Yes  Yes    Baseline Oxygen Saturation %  97 %  98 %    1 Minute Oxygen Saturation %  96 %  98 %    1 Minute Liters of Oxygen  0 L Room Air  0 L    2 Minute Oxygen Saturation %  95 %  98 %    2 Minute Liters of Oxygen  0 L  0 L    3 Minute Oxygen Saturation %  96 %  97 %    3 Minute Liters of Oxygen  0 L  0 L    4 Minute Oxygen Saturation %  95 %  96 %    4 Minute Liters of Oxygen  0 L  0 L    5 Minute Oxygen Saturation %  95 %  98 %    5 Minute Liters of Oxygen  0 L  0 L    6 Minute Oxygen Saturation %  96 %  97 %    6 Minute Liters of Oxygen  0 L  0 L    2 Minute Post Oxygen Saturation %  97 %  100 %    2 Minute Post Liters of Oxygen  0 L  0 L       Oxygen Initial Assessment: Oxygen Initial Assessment - 10/04/19 1036      Home Oxygen   Home Oxygen Device  Home Concentrator    Sleep Oxygen Prescription  Continuous    Liters per minute  2    Home Exercise Oxygen Prescription  None    Home at Rest Exercise Oxygen Prescription  None    Compliance with Home Oxygen Use  Yes      Initial 6 min Walk   Oxygen Used  None      Program Oxygen Prescription   Program Oxygen Prescription  None      Intervention   Short Term Goals  To learn and exhibit compliance with exercise, home and travel O2 prescription;To learn and understand importance of maintaining oxygen saturations>88%;To learn  and demonstrate proper use of respiratory medications;To learn and demonstrate proper pursed lip breathing techniques or other breathing techniques.;To learn and understand importance of monitoring SPO2 with pulse oximeter and demonstrate accurate use of the pulse oximeter.    Long  Term Goals  Verbalizes importance of monitoring SPO2 with pulse oximeter and return demonstration;Exhibits proper breathing techniques, such as pursed lip breathing or other method taught during program session;Demonstrates proper use of MDI's;Exhibits compliance with exercise, home and travel O2 prescription;Maintenance of O2 saturations>88%;Compliance with respiratory medication       Oxygen Re-Evaluation: Oxygen Re-Evaluation    Row Name 10/06/19 0858 11/11/19 0844 12/02/19 0842         Program Oxygen Prescription   Program Oxygen Prescription  None  None  None       Home Oxygen   Home Oxygen Device  Home Concentrator  Home  Concentrator  Home Concentrator     Sleep Oxygen Prescription  Continuous  Continuous  Continuous     Liters per minute  2  2  2      Home Exercise Oxygen Prescription  None  None  None     Home at Rest Exercise Oxygen Prescription  None  None  None     Compliance with Home Oxygen Use  Yes  Yes  Yes       Goals/Expected Outcomes   Short Term Goals  To learn and understand importance of monitoring SPO2 with pulse oximeter and demonstrate accurate use of the pulse oximeter.;To learn and understand importance of maintaining oxygen saturations>88%;To learn and demonstrate proper pursed lip breathing techniques or other breathing techniques.  To learn and exhibit compliance with exercise, home and travel O2 prescription;To learn and understand importance of monitoring SPO2 with pulse oximeter and demonstrate accurate use of the pulse oximeter.;To learn and understand importance of maintaining oxygen saturations>88%;To learn and demonstrate proper pursed lip breathing techniques or other  breathing techniques.;To learn and demonstrate proper use of respiratory medications  To learn and exhibit compliance with exercise, home and travel O2 prescription;To learn and understand importance of monitoring SPO2 with pulse oximeter and demonstrate accurate use of the pulse oximeter.;To learn and understand importance of maintaining oxygen saturations>88%;To learn and demonstrate proper pursed lip breathing techniques or other breathing techniques.;To learn and demonstrate proper use of respiratory medications     Long  Term Goals  Verbalizes importance of monitoring SPO2 with pulse oximeter and return demonstration;Maintenance of O2 saturations>88%;Exhibits proper breathing techniques, such as pursed lip breathing or other method taught during program session  Exhibits compliance with exercise, home and travel O2 prescription;Verbalizes importance of monitoring SPO2 with pulse oximeter and return demonstration;Maintenance of O2 saturations>88%;Exhibits proper breathing techniques, such as pursed lip breathing or other method taught during program session;Compliance with respiratory medication;Demonstrates proper use of MDI's  Exhibits compliance with exercise, home and travel O2 prescription;Verbalizes importance of monitoring SPO2 with pulse oximeter and return demonstration;Maintenance of O2 saturations>88%;Exhibits proper breathing techniques, such as pursed lip breathing or other method taught during program session;Compliance with respiratory medication;Demonstrates proper use of MDI's     Comments  Reviewed PLB technique with pt.  Talked about how it works and it's importance in maintaining their exercise saturations.  Reviewed PLB - pt is using inhalers as directed.  Pateient doesnt currently have oximeter at home.  He will order one and check O2 at home  Cody Day is doing well with oxygen at night, but he does still snore.  He is using PLB at home.  He is keeping a close eye on his saturations and  they are good for most part.     Goals/Expected Outcomes  Short: Become more profiecient at using PLB.   Long: Become independent at using PLB.  Short :  continue to practice PLB Long ; become independent using PLB  Short: Continue to montior saturations and use PLB.  Long: Continue to manage pulmonary disease.        Oxygen Discharge (Final Oxygen Re-Evaluation): Oxygen Re-Evaluation - 12/02/19 0842      Program Oxygen Prescription   Program Oxygen Prescription  None      Home Oxygen   Home Oxygen Device  Home Concentrator    Sleep Oxygen Prescription  Continuous    Liters per minute  2    Home Exercise Oxygen Prescription  None    Home at Rest Exercise Oxygen Prescription  None    Compliance with Home Oxygen Use  Yes      Goals/Expected Outcomes   Short Term Goals  To learn and exhibit compliance with exercise, home and travel O2 prescription;To learn and understand importance of monitoring SPO2 with pulse oximeter and demonstrate accurate use of the pulse oximeter.;To learn and understand importance of maintaining oxygen saturations>88%;To learn and demonstrate proper pursed lip breathing techniques or other breathing techniques.;To learn and demonstrate proper use of respiratory medications    Long  Term Goals  Exhibits compliance with exercise, home and travel O2 prescription;Verbalizes importance of monitoring SPO2 with pulse oximeter and return demonstration;Maintenance of O2 saturations>88%;Exhibits proper breathing techniques, such as pursed lip breathing or other method taught during program session;Compliance with respiratory medication;Demonstrates proper use of MDI's    Comments  Cody Day is doing well with oxygen at night, but he does still snore.  He is using PLB at home.  He is keeping a close eye on his saturations and they are good for most part.    Goals/Expected Outcomes  Short: Continue to montior saturations and use PLB.  Long: Continue to manage pulmonary disease.        Initial Exercise Prescription: Initial Exercise Prescription - 10/05/19 1400      Date of Initial Exercise RX and Referring Provider   Date  10/05/19    Referring Provider  [name] MD      Treadmill   MPH  1.6    Grade  0.5    Minutes  15    METs  2.34      NuStep   Level  2    SPM  80    Minutes  15    METs  2      REL-XR   Level  1    Speed  50    Minutes  15    METs  2      T5 Nustep   Level  2    SPM  80    Minutes  15    METs  2      Prescription Details   Frequency (times per week)  3    Duration  Progress to 30 minutes of continuous aerobic without signs/symptoms of physical distress      Intensity   THRR 40-80% of Max Heartrate  117-144    Ratings of Perceived Exertion  11-13    Perceived Dyspnea  0-4      Progression   Progression  Continue to progress workloads to maintain intensity without signs/symptoms of physical distress.      Resistance Training   Training Prescription  Yes    Weight  3 lb    Reps  10-15       Perform Capillary Blood Glucose checks as needed.  Exercise Prescription Changes: Exercise Prescription Changes    Row Name 10/05/19 1400 10/21/19 1300 11/05/19 1000 11/15/19 1700 12/01/19 1000     Response to Exercise   Blood Pressure (Admit)  112/64  136/70  142/80  138/78  150/80   Blood Pressure (Exercise)  146/64  164/84  140/66  148/70  180/80   Blood Pressure (Exit)  142/70  -  128/80  130/80  156/76   Heart Rate (Admit)  90 bpm  97 bpm  98 bpm  95 bpm  90 bpm   Heart Rate (Exercise)  125 bpm  106 bpm  103 bpm  113 bpm  122 bpm   Heart Rate (Exit)  95 bpm  101 bpm  98 bpm  90 bpm  104 bpm   Oxygen Saturation (Admit)  97 %  97 %  96 %  95 %  97 %   Oxygen Saturation (Exercise)  95 %  96 %  96 %  94 %  94 %   Oxygen Saturation (Exit)  98 %  96 %  96 %  96 %  97 %   Rating of Perceived Exertion (Exercise)  15  14  15  13  13    Perceived Dyspnea (Exercise)  3  3  3   -  3   Symptoms  hip pain 3/10, knee pain 3/10,  SOB  -  SOB  -  fatigue   Comments  walk test results  -  -  -  -   Duration  -  Continue with 30 min of aerobic exercise without signs/symptoms of physical distress.  Continue with 30 min of aerobic exercise without signs/symptoms of physical distress.  Continue with 30 min of aerobic exercise without signs/symptoms of physical distress.  Continue with 30 min of aerobic exercise without signs/symptoms of physical distress.   Intensity  -  THRR unchanged  THRR unchanged  THRR unchanged  THRR unchanged     Progression   Progression  -  Continue to progress workloads to maintain intensity without signs/symptoms of physical distress.  Continue to progress workloads to maintain intensity without signs/symptoms of physical distress.  Continue to progress workloads to maintain intensity without signs/symptoms of physical distress.  Continue to progress workloads to maintain intensity without signs/symptoms of physical distress.   Average METs  -  2.2  2.46  2.2  2.2     Resistance Training   Training Prescription  -  Yes  Yes  Yes  Yes   Weight  -  3 lb  6 lb  6 lb  6 lb   Reps  -  10-15  10-15  10-15  10-15     Interval Training   Interval Training  -  -  No  No  No     Treadmill   MPH  -  1.6  1.8  -  -   Grade  -  0.5  0.5  -  -   Minutes  -  15  15  -  -   METs  -  2.34  2.5  -  -     Recumbant Bike   Level  -  -  -  8  5   RPM  -  -  -  50  -   Minutes  -  -  -  15  15   METs  -  -  -  2.2  -     NuStep   Level  -  2  -  2  4   SPM  -  80  -  80  -   Minutes  -  15  -  15  15   METs  -  2  -  2  2.2     REL-XR   Level  -  -  1  -  -   Minutes  -  -  15  -  -   METs  -  -  2.8  -  -     T5 Nustep   Level  -  -  2  -  -   Minutes  -  -  15  -  -   METs  -  -  2.1  -  -   Row Name 12/14/19 1400 01/13/20 1300 01/28/20 1300 02/09/20 1300 02/22/20 1500     Response to Exercise   Blood Pressure (Admit)  138/76  124/82  140/74  142/82  136/70   Blood Pressure (Exercise)  184/88   164/74  172/78  162/80  134/74   Blood Pressure (Exit)  138/80  132/86  126/66  142/80  126/70   Heart Rate (Admit)  90 bpm  87 bpm  85 bpm  88 bpm  75 bpm   Heart Rate (Exercise)  113 bpm  111 bpm  108 bpm  104 bpm  101 bpm   Heart Rate (Exit)  105 bpm  98 bpm  90 bpm  93 bpm  83 bpm   Oxygen Saturation (Admit)  96 %  97 %  96 %  97 %  98 %   Oxygen Saturation (Exercise)  97 %  96 %  96 %  97 %  96 %   Oxygen Saturation (Exit)  97 %  97 %  97 %  96 %  96 %   Rating of Perceived Exertion (Exercise)  13  14  14  12  13    Perceived Dyspnea (Exercise)  3  3  2  2  3    Symptoms  -  -  -  -  SOB   Duration  Continue with 30 min of aerobic exercise without signs/symptoms of physical distress.  Continue with 30 min of aerobic exercise without signs/symptoms of physical distress.  Continue with 30 min of aerobic exercise without signs/symptoms of physical distress.  Continue with 30 min of aerobic exercise without signs/symptoms of physical distress.  Continue with 30 min of aerobic exercise without signs/symptoms of physical distress.   Intensity  THRR unchanged  THRR unchanged  THRR unchanged  THRR unchanged  THRR unchanged     Progression   Progression  Continue to progress workloads to maintain intensity without signs/symptoms of physical distress.  Continue to progress workloads to maintain intensity without signs/symptoms of physical distress.  Continue to progress workloads to maintain intensity without signs/symptoms of physical distress.  Continue to progress workloads to maintain intensity without signs/symptoms of physical distress.  Continue to progress workloads to maintain intensity without signs/symptoms of physical distress.   Average METs  3  3  2.8  2.9  3.65     Resistance Training   Training Prescription  Yes  Yes  Yes  Yes  Yes   Weight  6 lb  6 lb  6 lb  6 lb  6 lb   Reps  10-15  10-15  10-15  10-15  10-15     Interval Training   Interval Training  -  No  No  No  No      Treadmill   MPH  -  2.1  -  -  2.7   Grade  -  0  -  -  0   Minutes  -  15  -  -  15   METs  -  2.61  -  -  3.07     Recumbant Bike   Level  8  -  8  -  8   RPM  50  -  50  -  -   Watts  48  -  34  -  53   Minutes  15  -  15  -  15   METs  3.3  -  2.9  -  3.51     NuStep   Level  5  -  -  5  5   SPM  80  -  -  80  -   Minutes  15  -  -  15  15   METs  2.69  -  -  2.9  5     REL-XR   Level  -  5  -  -  5   Speed  -  50  -  -  -   Minutes  -  15  -  -  15     Home Exercise Plan   Plans to continue exercise at  -  -  -  -  Home (comment) walking   Frequency  -  -  -  -  Add 2 additional days to program exercise sessions.   Initial Home Exercises Provided  -  -  -  -  12/02/19      Exercise Comments: Exercise Comments    Row Name 10/06/19 0856 10/08/19 0920 11/04/19 0900 03/03/20 0745     Exercise Comments  First full day of exercise!  Patient was oriented to gym and equipment including functions, settings, policies, and procedures.  Patient's individual exercise prescription and treatment plan were reviewed.  All starting workloads were established based on the results of the 6 minute walk test done at initial orientation visit.  The plan for exercise progression was also introduced and progression will be customized based on patient's performance and goals.  First full day of exercise!  Patient was oriented to gym and equipment including functions, settings, policies, and procedures.  Patient's individual exercise prescription and treatment plan were reviewed.  All starting workloads were established based on the results of the 6 minute walk test done at initial orientation visit.  The plan for exercise progression was also introduced and progression will be customized based on patient's performance and goals.  Virtual call completed today. Calls are done every week that patient is not attending an onsite exercise session during the COVID 19 PHE Crisis.   Today's call included review  of :their home exercise  Cody Day had ED visit yesterday for raw,swollen throat. Has been prescribed a Z Pack. He is aware to finish med and be symptom free before returning to program. He is taking it easy this week as he heals.  Alondra graduated today from  rehab with 36 sessions completed.  Details of the patient's exercise prescription and what He needs to do in order to continue the prescription and progress were discussed with patient.  Patient was given a copy of prescription and goals.  Patient verbalized understanding.  Cody Day plans to continue to exercise by joining the [employer] or another gym.       Exercise Goals and Review: Exercise Goals    Row Name 10/05/19 1455             Exercise Goals   Increase Physical Activity  Yes       Intervention  Provide advice, education, support and counseling about physical activity/exercise needs.;Develop an individualized exercise prescription for aerobic and resistive training based on initial evaluation findings, risk stratification, comorbidities and participant's personal goals.       Expected Outcomes  Short Term: Attend rehab on a regular basis to increase amount of physical activity.;Long Term: Add in home  exercise to make exercise part of routine and to increase amount of physical activity.;Long Term: Exercising regularly at least 3-5 days a week.       Increase Strength and Stamina  Yes       Intervention  Provide advice, education, support and counseling about physical activity/exercise needs.;Develop an individualized exercise prescription for aerobic and resistive training based on initial evaluation findings, risk stratification, comorbidities and participant's personal goals.       Expected Outcomes  Short Term: Increase workloads from initial exercise prescription for resistance, speed, and METs.;Short Term: Perform resistance training exercises routinely during rehab and add in resistance training at home;Long Term: Improve  cardiorespiratory fitness, muscular endurance and strength as measured by increased METs and functional capacity (6MWT)       Able to understand and use rate of perceived exertion (RPE) scale  Yes       Intervention  Provide education and explanation on how to use RPE scale       Expected Outcomes  Long Term:  Able to use RPE to guide intensity level when exercising independently;Short Term: Able to use RPE daily in rehab to express subjective intensity level       Able to understand and use Dyspnea scale  Yes       Intervention  Provide education and explanation on how to use Dyspnea scale       Expected Outcomes  Short Term: Able to use Dyspnea scale daily in rehab to express subjective sense of shortness of breath during exertion;Long Term: Able to use Dyspnea scale to guide intensity level when exercising independently       Knowledge and understanding of Target Heart Rate Range (THRR)  Yes       Intervention  Provide education and explanation of THRR including how the numbers were predicted and where they are located for reference       Expected Outcomes  Short Term: Able to state/look up THRR;Short Term: Able to use daily as guideline for intensity in rehab;Long Term: Able to use THRR to govern intensity when exercising independently       Able to check pulse independently  Yes       Intervention  Provide education and demonstration on how to check pulse in carotid and radial arteries.;Review the importance of being able to check your own pulse for safety during independent exercise       Expected Outcomes  Short Term: Able to explain why pulse checking is important during independent exercise;Long Term: Able to check pulse independently and accurately       Understanding of Exercise Prescription  Yes       Intervention  Provide education, explanation, and written materials on patient's individual exercise prescription       Expected Outcomes  Short Term: Able to explain program exercise  prescription;Long Term: Able to explain home exercise prescription to exercise independently          Exercise Goals Re-Evaluation : Exercise Goals Re-Evaluation    Row Name 10/06/19 0857 10/08/19 0920 10/21/19 1344 11/05/19 1006 12/01/19 1049     Exercise Goal Re-Evaluation   Exercise Goals Review  Able to understand and use rate of perceived exertion (RPE) scale;Able to understand and use Dyspnea scale;Knowledge and understanding of Target Heart Rate Range (THRR);Understanding of Exercise Prescription  Able to understand and use rate of perceived exertion (RPE) scale;Knowledge and understanding of Target Heart Rate Range (THRR);Understanding of Exercise Prescription  Able to understand and use rate  of perceived exertion (RPE) scale;Able to understand and use Dyspnea scale;Knowledge and understanding of Target Heart Rate Range (THRR);Able to check pulse independently;Understanding of Exercise Prescription  Increase Physical Activity;Increase Strength and Stamina;Understanding of Exercise Prescription  Increase Physical Activity;Increase Strength and Stamina;Understanding of Exercise Prescription   Comments  Reviewed RPE and dyspnea scale, THR and program prescription with pt today.  Pt voiced understanding and was given a copy of goals to take home.  Reviewed RPE scale, THR and program prescription with pt today.  Pt voiced understanding and was given a copy of goals to take home.  Cody Day is tolerating exercise well and will work on Catering manager on TM.  Staff will monitor progress.  Cody Day has been doing well with rehab.  He has been holding steady at 2.1 METS on T5 NuStep and 2.8 METs on XR.  We will start to move up his workloads and continue to montior his progress.  Cody Day has been doing well in rehab.  He reduced his workloads some yesterday as he had been out sick the previous week.  He is glad to be back to class.  We will continue to monitor his progress.   Expected Outcomes  Short: Use RPE and  dyspnea scales daily to regulate intensity. Long: Follow program prescription in THR.  Short: Use RPE daily to regulate intensity. Long: Follow program prescription in THR.  Short - exercise consistently Long - increase overall stamina  Short: Increase workloads. Long: Continue to improve stamina.  Short: Return to regular attendance.  Long: Continue to improve strength and stamina.   [place] Name 12/02/19 8786 12/14/19 1456 12/29/19 1325 01/13/20 1320 01/28/20 1335     Exercise Goal Re-Evaluation   Exercise Goals Review  Increase Physical Activity;Increase Strength and Stamina;Understanding of Exercise Prescription  Increase Physical Activity;Increase Strength and Stamina;Able to understand and use rate of perceived exertion (RPE) scale;Able to understand and use Dyspnea scale;Knowledge and understanding of Target Heart Rate Range (THRR);Able to check pulse independently;Understanding of Exercise Prescription  -  Increase Physical Activity;Increase Strength and Stamina;Able to understand and use rate of perceived exertion (RPE) scale;Able to understand and use Dyspnea scale;Knowledge and understanding of Target Heart Rate Range (THRR);Able to check pulse independently;Understanding of Exercise Prescription  Increase Physical Activity;Increase Strength and Stamina;Able to understand and use rate of perceived exertion (RPE) scale;Able to understand and use Dyspnea scale;Knowledge and understanding of Target Heart Rate Range (THRR);Able to check pulse independently;Understanding of Exercise Prescription   Comments  Cody Day is doing well in rehab.  His exercise at home has been chasing his 4 and 54 yo grandkids.  Reviewed home exercise with pt today.  Pt plans to walking at track at school for exercise.  Reviewed THR, pulse, RPE, sign and symptoms, and when to call 911 or MD.  Also discussed weather considerations and indoor options.  Pt voiced understanding.  Cody Day is back up to level 8 on RB.   He works at level 5 on [place].   He will progress more now that we are back to 2-3 days per week.  Out since last review with COVID-19  This is Arts first week back after being out.  He tolerated exercise well.  Cody Day was at RPE 9 on bike - we discussed increasing resistance or RPM to be more challenging.   Expected Outcomes  Short: Walk consistently one extra day a week.  Long: Continue to improve stamina.  Short : exercsie consistently 3 days per week Long : increase MET level  -  Short:  attend consistently Long: improve overall stamina  Short : continue to work in challenging RPE 11-15 Long: increase overall MET level   [place] Name 02/09/20 1307 02/22/20 1500 02/23/20 0946         Exercise Goal Re-Evaluation   Exercise Goals Review  Increase Physical Activity;Increase Strength and Stamina;Able to understand and use rate of perceived exertion (RPE) scale;Able to understand and use Dyspnea scale;Knowledge and understanding of Target Heart Rate Range (THRR);Able to check pulse independently;Understanding of Exercise Prescription  Increase Physical Activity;Increase Strength and Stamina;Understanding of Exercise Prescription  Increase Physical Activity;Increase Strength and Stamina;Understanding of Exercise Prescription     Comments  Cody Day has been working at RPE 11-13 and uses 6 lb weights for strength work.  Cody Day has officailly switched classes to earlier morning as he finishes up.  We expect to see an improvement in his post 6MWT.  He has taken to the BioStep very well.  He is planning to continue to exercise by walking at home.  Cody Day improved post 6MWT by 19.7 %!!  He is planning to continue to walk for exericse at home.     Expected Outcomes  Short:  continue to attend consistently Long:continue to build stamina  Short: Improve post 6MWT  Long: Continue to exercise independently  Short: Graduate!!  Long: Continue to exercise indenpendently.        Discharge Exercise Prescription (Final Exercise Prescription Changes): Exercise Prescription  Changes - 02/22/20 1500      Response to Exercise   Blood Pressure (Admit)  136/70    Blood Pressure (Exercise)  134/74    Blood Pressure (Exit)  126/70    Heart Rate (Admit)  75 bpm    Heart Rate (Exercise)  101 bpm    Heart Rate (Exit)  83 bpm    Oxygen Saturation (Admit)  98 %    Oxygen Saturation (Exercise)  96 %    Oxygen Saturation (Exit)  96 %    Rating of Perceived Exertion (Exercise)  13    Perceived Dyspnea (Exercise)  3    Symptoms  SOB    Duration  Continue with 30 min of aerobic exercise without signs/symptoms of physical distress.    Intensity  THRR unchanged      Progression   Progression  Continue to progress workloads to maintain intensity without signs/symptoms of physical distress.    Average METs  3.65      Resistance Training   Training Prescription  Yes    Weight  6 lb    Reps  10-15      Interval Training   Interval Training  No      Treadmill   MPH  2.7    Grade  0    Minutes  15    METs  3.07      Recumbant Bike   Level  8    Watts  53    Minutes  15    METs  3.51      NuStep   Level  5    Minutes  15    METs  5      REL-XR   Level  5    Minutes  15      Home Exercise Plan   Plans to continue exercise at  Home (comment)   walking   Frequency  Add 2 additional days to program exercise sessions.    Initial Home Exercises Provided  12/02/19       Nutrition:  Target Goals: Understanding of nutrition guidelines, daily intake of sodium <1590m, cholesterol <2090m calories 30% from fat and 7% or less from saturated fats, daily to have 5 or more servings of fruits and vegetables.  Education: Controlling Sodium/Reading Food Labels -Group verbal and written material supporting the discussion of sodium use in heart healthy nutrition. Review and explanation with models, verbal and written materials for utilization of the food label.   Education: General Nutrition Guidelines/Fats and Fiber: -Group instruction provided by verbal, written  material, models and posters to present the general guidelines for heart healthy nutrition. Gives an explanation and review of dietary fats and fiber.   Biometrics: Pre Biometrics - 10/05/19 1455      Pre Biometrics   Height  5' 11.1" (1.806 m)    Weight  244 lb 11.2 oz (111 kg)    BMI (Calculated)  34.03    Single Leg Stand  3.12 seconds      Post Biometrics - 02/23/20 0944       Post  Biometrics   Height  5' 11.1" (1.806 m)    Weight  250 lb 11.2 oz (113.7 kg)    BMI (Calculated)  34.87       Nutrition Therapy Plan and Nutrition Goals: Nutrition Therapy & Goals - 11/18/19 1401      Nutrition Therapy   Diet  Low Na, HH, DM    Protein (specify units)  90g    Fiber  30 grams    Whole Grain Foods  3 servings    Saturated Fats  12 max. grams    Fruits and Vegetables  5 servings/day    Sodium  1.5 grams      Personal Nutrition Goals   Nutrition Goal  ST: Add vegetables into burrito LT: like to get back to shape he was in 5 years ago, improve breathing and SOB    Comments  A1c. Pt using stevia for sweet tea instead of sugar. ~36 eggs/week. 2 scrambled egg burrito daily. May have peanut butter sandwich or some cookies. Sometimes have pasta salad with olive apple. Pt reports not salting anything. 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 Eats roasted unsalted nuts. Discussed HH eating. Suggested adding vegetables to burritos and making burritos whole wheat. Pt chose adding vegetables.      Intervention Plan   Intervention  Prescribe, educate and counsel regarding individualized specific dietary modifications aiming towards targeted core components such as weight, hypertension, lipid management, diabetes, heart failure and other comorbidities.;Nutrition handout(s) given to patient.    Expected Outcomes  Short Term Goal: Understand basic principles of dietary content, such as calories, fat, sodium, cholesterol and  nutrients.;Short Term Goal: A plan has been developed with personal nutrition goals set during dietitian appointment.;Long Term Goal: Adherence to prescribed nutrition plan.       Nutrition Assessments: Nutrition Assessments - 02/25/20 0940      MEDFICTS Scores   Pre Score  77    Post Score  71    Score Difference  -6       MEDIFICTS Score Key:          ?70 Need to make dietary changes          40-70 Heart Healthy Diet         ? 40 Therapeutic Level Cholesterol Diet  Nutrition Goals Re-Evaluation: Nutrition Goals Re-Evaluation    [place] Name 01/03/20 [id number]/05/21 [id number]/12/21 [id number]  Goals   Nutrition Goal  ST: Add vegetables into burrito LT: like to get back to shape he was in 5 years ago, improve breathing and SOB  ST: continue with current changes LT: like to get back to shape he was in 5 years ago, improve breathing and SOB  ST: continue to make changes LT: like to get back to shape he was in 5 years ago, improve breathing and SOB     Comment  RD unable to complete re-eval this evaluation cycle. Pt currently out due to COVID19 +ve diagnosis. Will f/u with goals after pt return to rehab.  Pt reports that post covid his appetite is ok, SOB more and tired more. Pt reports not eating as many burritos anymore (1 instead of 3). Honoring hunger. Now pt eating more oranges and drinking apple juice.  Continue to make small changes every 2 weeks to 30 days focusing on MyPlate and heart healthy eating as a guide. Pt has reduced burrito consumption and added more fruit to his diet. Prior to rehab pt reports eating about 36 eggs/week, with reduction in burritos, his egg consumption has also been reduced.     Expected Outcome  Pt return to rehab  ST: continue with current changes  LT: like to get back to shape he was in 5 years ago, improve breathing and SOB  ST: continue to make changes LT: like to get back to shape he was in 5 years ago, improve breathing and SOB        Nutrition Goals  Discharge (Final Nutrition Goals Re-Evaluation): Nutrition Goals Re-Evaluation - 03/01/20 0904      Goals   Nutrition Goal  ST: continue to make changes LT: like to get back to shape he was in 5 years ago, improve breathing and SOB    Comment  Continue to make small changes every 2 weeks to 30 days focusing on MyPlate and heart healthy eating as a guide. Pt has reduced burrito consumption and added more fruit to his diet. Prior to rehab pt reports eating about 36 eggs/week, with reduction in burritos, his egg consumption has also been reduced.    Expected Outcome  ST: continue to make changes LT: like to get back to shape he was in 5 years ago, improve breathing and SOB       Psychosocial: Target Goals: Acknowledge presence or absence of significant depression and/or stress, maximize coping skills, provide positive support system. Participant is able to verbalize types and ability to use techniques and skills needed for reducing stress and depression.   Education: Depression - Provides group verbal and written instruction on the correlation between heart/lung disease and depressed mood, treatment options, and the stigmas associated with seeking treatment.   Education: Sleep Hygiene -Provides group verbal and written instruction about how sleep can affect your health.  Define sleep hygiene, discuss sleep cycles and impact of sleep habits. Review good sleep hygiene tips.     Education: Stress and Anxiety: - Provides group verbal and written instruction about the health risks of elevated stress and causes of high stress.  Discuss the correlation between heart/lung disease and anxiety and treatment options. Review healthy ways to manage with stress and anxiety.    Initial Review & Psychosocial Screening: Initial Psych Review & Screening - 10/04/19 1037      Initial Review   Current issues with  Current Anxiety/Panic;Current Depression      Family Dynamics   Good Support System?  Yes  Comments  He can look to his wife, daughter, son and his church for support.      Barriers   Psychosocial barriers to participate in program  The patient should benefit from training in stress management and relaxation.      Screening Interventions   Interventions  Provide feedback about the scores to participant;Encouraged to exercise;Program counselor consult;To provide support and resources with identified psychosocial needs    Expected Outcomes  Long Term Goal: Stressors or current issues are controlled or eliminated.;Short Term goal: Utilizing psychosocial counselor, staff and physician to assist with identification of specific Stressors or current issues interfering with healing process. Setting desired goal for each stressor or current issue identified.;Short Term goal: Identification and review with participant of any Quality of Life or Depression concerns found by scoring the questionnaire.;Long Term goal: The participant improves quality of Life and PHQ9 Scores as seen by post scores and/or verbalization of changes       Quality of Life Scores:   Scores of 19 and below usually indicate a poorer quality of life in these areas.  A difference of  2-3 points is a clinically meaningful difference.  A difference of 2-3 points in the total score of the Quality of Life Index has been associated with significant improvement in overall quality of life, self-image, physical symptoms, and general health in studies assessing change in quality of life.  PHQ-9: Recent Review Flowsheet Data    Depression screen [facility] 2/9 02/25/2020 02/03/2020 01/17/2020 10/05/2019 02/18/2019   Decreased Interest 1 0 1 1 0   Down, Depressed, Hopeless 0 0 1 3 0   PHQ - 2 Score 1 0 2 4 0   Altered sleeping 1 0 3 0 -   Tired, decreased energy 3 0 3 3 -   Change in appetite 1 0 2 0 -   Feeling bad or failure about yourself  3 0 0 0 -   Trouble concentrating 0 0 0 1 -   Moving slowly or fidgety/restless 3 0 1 3 -   Suicidal  thoughts 0 0 0 0 -   PHQ-9 Score 12 0 11 11 -   Difficult doing work/chores Very difficult Not difficult at all Not difficult at all Somewhat difficult -     Interpretation of Total Score  Total Score Depression Severity:  1-4 = Minimal depression, 5-9 = Mild depression, 10-14 = Moderate depression, 15-19 = Moderately severe depression, 20-27 = Severe depression   Psychosocial Evaluation and Intervention: Psychosocial Evaluation - 02/23/20 0947      Discharge Psychosocial Assessment & Intervention   Comments  Cody Day has done well in rehab and is pleased with his progress.  He notes an improvement in his overall health.  He is walking better now and his weight is also getting back to where it was before all of this happened.  He feels stronger and has more stamina.  He plans to keep it going by going the mall to walk with his wife.       Psychosocial Re-Evaluation: Psychosocial Re-Evaluation    [place] Name 11/11/19 [phone] 12/02/19 0838 01/17/20 0846         Psychosocial Re-Evaluation   Current issues with  Current Stress Concerns;Current Sleep Concerns  Current Stress Concerns;Current Sleep Concerns;Current Psychotropic Meds;Current Anxiety/Panic  History of Depression;Current Psychotropic Meds     Comments  Cody Day states his meds take care of any stress/anxiety.  He sleeps well - has a Purple mattress and pillow.  Cody Day  is doing well overall.  His meds keep him well mangaed and denies current anxiety symptoms.  He gets tired after meals and naps.  He has been helping to care for his grandkids during the week which keeps him busy.  Reviewed patient health questionnaire (PHQ-9) with patient for follow up. Previously, patients score indicated signs/symptoms of depression.  Reviewed to see if patient is improving symptom wise while in program.  Score stayed the same patient states that it is because he has had these problems and wants to see his grandkids grow up.     Expected Outcomes  Short - continue to  take meds as directed Long : maintain good sleep patterns  Short - continue to take meds as directed Long : maintain good sleep patterns  Short: Continue to work toward an improvement in [place] scores by attending [facility] regularly. Long: Continue to improve stress and depression coping skills by talking with staff and attending [facility] regularly and work toward a positive mental state.     Interventions  -  Encouraged to attend Pulmonary Rehabilitation for the exercise  Encouraged to attend Pulmonary Rehabilitation for the exercise     Continue Psychosocial Services   -  Follow up required by staff  Follow up required by staff        Psychosocial Discharge (Final Psychosocial Re-Evaluation): Psychosocial Re-Evaluation - 01/17/20 0846      Psychosocial Re-Evaluation   Current issues with  History of Depression;Current Psychotropic Meds    Comments  Reviewed patient health questionnaire (PHQ-9) with patient for follow up. Previously, patients score indicated signs/symptoms of depression.  Reviewed to see if patient is improving symptom wise while in program.  Score stayed the same patient states that it is because he has had these problems and wants to see his grandkids grow up.    Expected Outcomes  Short: Continue to work toward an improvement in [place] scores by attending [facility] regularly. Long: Continue to improve stress and depression coping skills by talking with staff and attending [facility] regularly and work toward a positive mental state.    Interventions  Encouraged to attend Pulmonary Rehabilitation for the exercise    Continue Psychosocial Services   Follow up required by staff       Vocational Rehabilitation: Provide vocational rehab assistance to qualifying candidates.   Vocational Rehab Evaluation & Intervention:   Education: Education Goals: Education classes will be provided on a variety of topics geared toward better understanding of heart health and risk factor  modification. Participant will state understanding/return demonstration of topics presented as noted by education test scores.  Learning Barriers/Preferences: Learning Barriers/Preferences - 10/04/19 1039      Learning Barriers/Preferences   Learning Barriers  None    Learning Preferences  None       General Cardiac Education Topics:  AED/CPR: - Group verbal and written instruction with the use of models to demonstrate the basic use of the AED with the basic ABC's of resuscitation.   Anatomy & Physiology of the Heart: - Group verbal and written instruction and models provide basic cardiac anatomy and physiology, with the coronary electrical and arterial systems. Review of Valvular disease and Heart Failure   Cardiac Procedures: - Group verbal and written instruction to review commonly prescribed medications for heart disease. Reviews the medication, class of the drug, and side effects. Includes the steps to properly store meds and maintain the prescription regimen. (beta blockers and nitrates)   Cardiac Medications I: - Group verbal and  written instruction to review commonly prescribed medications for heart disease. Reviews the medication, class of the drug, and side effects. Includes the steps to properly store meds and maintain the prescription regimen.   Cardiac Medications II: -Group verbal and written instruction to review commonly prescribed medications for heart disease. Reviews the medication, class of the drug, and side effects. (all other drug classes)    Go Sex-Intimacy & Heart Disease, Get SMART - Goal Setting: - Group verbal and written instruction through game format to discuss heart disease and the return to sexual intimacy. Provides group verbal and written material to discuss and apply goal setting through the application of the S.M.A.R.T. Method.   Other Matters of the Heart: - Provides group verbal, written materials and models to describe Stable Angina and  Peripheral Artery. Includes description of the disease process and treatment options available to the cardiac patient.   Infection Prevention: - Provides verbal and written material to individual with discussion of infection control including proper hand washing and proper equipment cleaning during exercise session.   Pulmonary Rehab from 10/05/2019 in [facility] Cardiac and Pulmonary Rehab  Date  10/05/19  Educator  [facility]  Instruction Review Code  1- Verbalizes Understanding      Falls Prevention: - Provides verbal and written material to individual with discussion of falls prevention and safety.   Pulmonary Rehab from 10/05/2019 in [facility] Cardiac and Pulmonary Rehab  Date  10/04/19  Educator  [facility]  Instruction Review Code  1- Verbalizes Understanding      Other: -Provides group and verbal instruction on various topics (see comments)   Knowledge Questionnaire Score: Knowledge Questionnaire Score - 02/25/20 0940      Knowledge Questionnaire Score   Pre Score  16/18 education focus: O2 safety, ADLs    Post Score  15/18       Core Components/Risk Factors/Patient Goals at Admission: Personal Goals and Risk Factors at Admission - 10/05/19 1459      Core Components/Risk Factors/Patient Goals on Admission    Weight Management  Yes;Weight Loss    Intervention  Weight Management: Develop a combined nutrition and exercise program designed to reach desired caloric intake, while maintaining appropriate intake of nutrient and fiber, sodium and fats, and appropriate energy expenditure required for the weight goal.;Weight Management: Provide education and appropriate resources to help participant work on and attain dietary goals.;Weight Management/Obesity: Establish reasonable short term and long term weight goals.;Obesity: Provide education and appropriate resources to help participant work on and attain dietary goals.    Admit Weight  244 lb 11.2 oz (111 kg)    Goal Weight: Short Term  235 lb (106.6  kg)    Goal Weight: Long Term  230 lb (104.3 kg)    Expected Outcomes  Short Term: Continue to assess and modify interventions until short term weight is achieved;Weight Maintenance: Understanding of the daily nutrition guidelines, which includes 25-35% calories from fat, 7% or less cal from saturated fats, less than 264m cholesterol, less than 1.5gm of sodium, & 5 or more servings of fruits and vegetables daily;Weight Loss: Understanding of general recommendations for a balanced deficit meal plan, which promotes 1-2 lb weight loss per week and includes a negative energy balance of [phone] kcal/d;Understanding recommendations for meals to include 15-35% energy as protein, 25-35% energy from fat, 35-60% energy from carbohydrates, less than 2084mof dietary cholesterol, 20-35 gm of total fiber daily;Understanding of distribution of calorie intake throughout the day with the consumption of 4-5 meals/snacks;Long Term: Adherence  to nutrition and physical activity/exercise program aimed toward attainment of established weight goal    Tobacco Cessation  Yes    Intervention  Assist the participant in steps to quit. Provide individualized education and counseling about committing to Tobacco Cessation, relapse prevention, and pharmacological support that can be provided by physician.;Advice worker, assist with locating and accessing local/national Quit Smoking programs, and support quit date choice.    Expected Outcomes  Short Term: Will demonstrate readiness to quit, by selecting a quit date.;Long Term: Complete abstinence from all tobacco products for at least 12 months from quit date.;Short Term: Will quit all tobacco product use, adhering to prevention of relapse plan.    Improve shortness of breath with ADL's  Yes    Intervention  Provide education, individualized exercise plan and daily activity instruction to help decrease symptoms of SOB with activities of daily living.    Expected Outcomes   Short Term: Improve cardiorespiratory fitness to achieve a reduction of symptoms when performing ADLs;Long Term: Be able to perform more ADLs without symptoms or delay the onset of symptoms    Hypertension  Yes    Intervention  Provide education on lifestyle modifcations including regular physical activity/exercise, weight management, moderate sodium restriction and increased consumption of fresh fruit, vegetables, and low fat dairy, alcohol moderation, and smoking cessation.;Monitor prescription use compliance.    Expected Outcomes  Short Term: Continued assessment and intervention until BP is < 140/35m HG in hypertensive participants. < 130/843mHG in hypertensive participants with diabetes, heart failure or chronic kidney disease.;Long Term: Maintenance of blood pressure at goal levels.    Lipids  Yes    Intervention  Provide education and support for participant on nutrition & aerobic/resistive exercise along with prescribed medications to achieve LDL <7050mHDL >110m63m  Expected Outcomes  Short Term: Participant states understanding of desired cholesterol values and is compliant with medications prescribed. Participant is following exercise prescription and nutrition guidelines.;Long Term: Cholesterol controlled with medications as prescribed, with individualized exercise RX and with personalized nutrition plan. Value goals: LDL < 70mg93mL > 40 mg.       Education:Diabetes - Individual verbal and written instruction to review signs/symptoms of diabetes, desired ranges of glucose level fasting, after meals and with exercise. Acknowledge that pre and post exercise glucose checks will be done for 3 sessions at entry of program.   Education: Know Your Numbers and Risk Factors: -Group verbal and written instruction about important numbers in your health.  Discussion of what are risk factors and how they play a role in the disease process.  Review of Cholesterol, Blood Pressure, Diabetes, and BMI  and the role they play in your overall health.   Core Components/Risk Factors/Patient Goals Review:  Goals and Risk Factor Review    Row Name 11/11/19 0817 12/02/19 0840 01/17/20 0838 [id number]    Core Components/Risk Factors/Patient Goals Review   Personal Goals Review  Weight Management/Obesity;Improve shortness of breath with ADL's;Hypertension;Lipids  Weight Management/Obesity;Improve shortness of breath with ADL's;Hypertension;Lipids  Weight Management/Obesity;Improve shortness of breath with ADL's     Review  Cody Day hasnt been checking BP at home.  [facility]  He does have a monitor at home.  He will start checking at home at least once a day.  His weight is holding at 247.  He does stretch when he gets up.  He can play with his grandkids for short periods.  He would like to be  able to play more with them.  Cody Day has an appointment with Dr. Fletcher Anon next week for a follow up.  He should get a good report.  His weight is creeping up and he hopes its muscle as he is feeling stronger overall.  He only eats twice a day as he doesn't move as much.  His pressures have been good and breathing is improving.  He has found that he can now go up the stairs!  Patients weight is up since the start of the program. He wants to work on weight loss. His shortness of breath has been a little worse when showering and getting ready for the day. He is feeling of in general but gets short of breath when doing certain activities.     Expected Outcomes  Short :  walk track at [employer] or the mall 2 days per week Long :  walk up and down stairs without getting short of breath  Short: Continue to work on weight loss.  Long: Continue to exericse for weight loss and SOB.  Short: Attend [facility] regularly to improve shortness of breath with ADL's. Long: maintain independence with ADL's        Core Components/Risk Factors/Patient Goals at Discharge (Final Review):  Goals and Risk Factor Review - 01/17/20  0838      Core Components/Risk Factors/Patient Goals Review   Personal Goals Review  Weight Management/Obesity;Improve shortness of breath with ADL's    Review  Patients weight is up since the start of the program. He wants to work on weight loss. His shortness of breath has been a little worse when showering and getting ready for the day. He is feeling of in general but gets short of breath when doing certain activities.    Expected Outcomes  Short: Attend [facility] regularly to improve shortness of breath with ADL's. Long: maintain independence with ADL's       ITP Comments: ITP Comments    Row Name 10/05/19 1232 10/06/19 0856 10/08/19 0919 10/20/19 0912 11/04/19 0900   ITP Comments  Completed 6MWT and gym orientation.  Initial ITP created and sent for review to Dr. Emily Filbert, Medical Director.  First full day of exercise!  Patient was oriented to gym and equipment including functions, settings, policies, and procedures.  Patient's individual exercise prescription and treatment plan were reviewed.  All starting workloads were established based on the results of the 6 minute walk test done at initial orientation visit.  The plan for exercise progression was also introduced and progression will be customized based on patient's performance and goals.  First full day of exercise!  Patient was oriented to gym and equipment including functions, settings, policies, and procedures.  Patient's individual exercise prescription and treatment plan were reviewed.  All starting workloads were established based on the results of the 6 minute walk test done at initial orientation visit.  The plan for exercise progression was also introduced and progression will be customized based on patient's performance and goals.  30 day review competed . ITP sent to Dr Emily Filbert for review, changes as needed and ITP approval signature  New tp program  Virtual call completed today. Calls are done every week that patient is not  attending an onsite exercise session during the COVID 19 PHE Crisis.   Today's call included review of :their home exercise  Cody Day had ED visit yesterday for raw,swollen throat. Has been prescribed a Z Pack. He is aware to finish med and be symptom free  before returning to program. He is taking it easy this week as he heals.   [place] Name 11/04/19 2561 11/17/19 1453 11/18/19 1425 12/15/19 0707 12/20/19 1408   ITP Comments  Pt reports not being able to complete RD appointment due to recent ER visit and visiting grandchildren. Rescheduled RD evaluation with pt.  30 day review completed. ITP sent to Dr. Emily Filbert, Medical Director of Cardiac and Pulmonary Rehab. Continue with ITP unless changes are made by physician.  Department operating under reduced schedule until further notice by request from hospital leadership.  Completed Initial RD Eval  30 day chart review completed. ITP sent to Dr Zachery Dakins Medical Director, for review,changes as needed and signature.  Cody Day's wife called to let us know that he tested positive for COVID-19 today.  They were instructed to stay out for at least 20 days.   [place] Name 01/03/20 0842 01/12/20 1013 02/09/20 1041 03/03/20 0746     ITP Comments  RD unable to complete re-eval this evaluation cycle. Pt currently out due to COVID19 +ve diagnosis. Will f/u with goals after pt return to rehab.  30 day chart review completed. ITP sent to Dr Zachery Dakins Medical Director, for review,changes as needed and signature.  Has returned after hving COVID 19 virus  30 day chart review completed. ITP sent to Dr Zachery Dakins Medical Director, for review,changes as needed and signature. Continue with ITP if no changes requested  Discharge ITP sent and signed by Dr. Sabra Heck.  Discharge Summary routed to PCP and cardiologist.      Comments: Initial ITP

## 2020-03-03 NOTE — Progress Notes (Signed)
Daily Session Note  Patient Details  Name: Cody Day MRN: [id number] Date of Birth: 1956/06/21 Referring Provider:     Pulmonary Rehab from 10/05/2019 in [facility] Card[facility]  Referring Provider  [name] MD      Encounter Date: 03/03/2020  Check In: Session Check In - 03/03/20 0744      Check-In   Supervising physician immediately available to respond to emergencies  See telemetry face sheet for immediately available ER MD    Location  [facility]-Cardiac & Pulmonary Rehab    Staff Present  Basilia Jumbo, RN, BSN;Bellatrix Devonshire Allen, MA, RCEP, CCRP, CCET;Joseph Oak RCP,RRT,BSRT    Virtual Visit  No    Medication changes reported      No    Fall or balance concerns reported     No    Warm-up and Cool-down  Performed on first and last piece of equipment    Resistance Training Performed  Yes    VAD Patient?  No    PAD/SET Patient?  No      Pain Assessment   Currently in Pain?  No/denies          Social History   Tobacco Use  Smoking Status Former Smoker  . Packs/day: 0.25  . Years: 35.00  . Pack years: 8.75  . Types: Cigarettes  . Quit date: 10/22/1995  . Years since quitting: 24.3  Smokeless Tobacco Current User  . Types: Chew  Tobacco Comment   rarely smoked cigarettes and cigars.  states he smoked drugs mostly    Goals Met:  Proper associated with RPD/PD & O2 Sat Independence with exercise equipment Using PLB without cueing & demonstrates good technique Exercise tolerated well Personal goals reviewed No report of cardiac concerns or symptoms Strength training completed today  Goals Unmet:  Not Applicable  Comments:  Cody Day graduated today from  rehab with 36 sessions completed.  Details of the patient's exercise prescription and what He needs to do in order to continue the prescription and progress were discussed with patient.  Patient was given a copy of prescription and goals.  Patient verbalized understanding.  Cody Day plans to continue to  exercise by joining the [employer] or another gym.    Dr. Emily Filbert is Medical Director for [place] and [facility].

## 2020-03-03 NOTE — Progress Notes (Signed)
Discharge Progress Report  Patient Details  Name: Cody Day MRN: [id number] Date of Birth: November 06, 1955 Referring Provider:     Pulmonary Rehab from 10/05/2019 in [facility]  Referring Provider  [name] MD       Number of Visits: 36  Reason for Discharge:  Patient reached a stable level of exercise. Patient independent in their exercise. Patient has met program and personal goals.  Smoking History:  Social History   Tobacco Use  Smoking Status Former Smoker  . Packs/day: 0.25  . Years: 35.00  . Pack years: 8.75  . Types: Cigarettes  . Quit date: 10/22/1995  . Years since quitting: 24.3  Smokeless Tobacco Current User  . Types: Chew  Tobacco Comment   rarely smoked cigarettes and cigars.  states he smoked drugs mostly    Diagnosis:  Severe persistent chronic asthma without complication  ADL UCSD: Pulmonary Assessment Scores    Row Name 10/05/19 1501 02/25/20 0941       ADL UCSD   ADL Phase  Entry  Exit    SOB Score total  79  66    Rest  1  1    Walk  3  3    Stairs  4  3    Bath  5  3    Dress  4  3    Shop  4  2      CAT Score   CAT Score  25  27      mMRC Score   mMRC Score  4  2       Initial Exercise Prescription: Initial Exercise Prescription - 10/05/19 1400      Date of Initial Exercise RX and Referring Provider   Date  10/05/19    Referring Provider  [name] MD      Treadmill   MPH  1.6    Grade  0.5    Minutes  15    METs  2.34      NuStep   Level  2    SPM  80    Minutes  15    METs  2      REL-XR   Level  1    Speed  50    Minutes  15    METs  2      T5 Nustep   Level  2    SPM  80    Minutes  15    METs  2      Prescription Details   Frequency (times per week)  3    Duration  Progress to 30 minutes of continuous aerobic without signs/symptoms of physical distress      Intensity   THRR 40-80% of Max Heartrate  117-144    Ratings of Perceived Exertion  11-13    Perceived Dyspnea   0-4      Progression   Progression  Continue to progress workloads to maintain intensity without signs/symptoms of physical distress.      Resistance Training   Training Prescription  Yes    Weight  3 lb    Reps  10-15       Discharge Exercise Prescription (Final Exercise Prescription Changes): Exercise Prescription Changes - 02/22/20 1500      Response to Exercise   Blood Pressure (Admit)  136/70    Blood Pressure (Exercise)  134/74    Blood Pressure (Exit)  126/70    Heart Rate (Admit)  75 bpm    Heart Rate (Exercise)  101 bpm    Heart Rate (Exit)  83 bpm    Oxygen Saturation (Admit)  98 %    Oxygen Saturation (Exercise)  96 %    Oxygen Saturation (Exit)  96 %    Rating of Perceived Exertion (Exercise)  13    Perceived Dyspnea (Exercise)  3    Symptoms  SOB    Duration  Continue with 30 min of aerobic exercise without signs/symptoms of physical distress.    Intensity  THRR unchanged      Progression   Progression  Continue to progress workloads to maintain intensity without signs/symptoms of physical distress.    Average METs  3.65      Resistance Training   Training Prescription  Yes    Weight  6 lb    Reps  10-15      Interval Training   Interval Training  No      Treadmill   MPH  2.7    Grade  0    Minutes  15    METs  3.07      Recumbant Bike   Level  8    Watts  53    Minutes  15    METs  3.51      NuStep   Level  5    Minutes  15    METs  5      REL-XR   Level  5    Minutes  15      Home Exercise Plan   Plans to continue exercise at  Home (comment)   walking   Frequency  Add 2 additional days to program exercise sessions.    Initial Home Exercises Provided  12/02/19       Functional Capacity: 6 Minute Walk    Row Name 10/05/19 1441 02/23/20 0941       6 Minute Walk   Phase  Initial  Discharge    Distance  994 feet  1190 feet    Distance % Change  --  19.7 %    Distance Feet Change  --  196 ft    Walk Time  6 minutes  6 minutes     # of Rest Breaks  0  0    MPH  1.88  2.25    METS  2.82  2.82    RPE  15  13    Perceived Dyspnea   3  3    VO2 Peak  9.88  9.89    Symptoms  Yes (comment)  Yes (comment)    Comments  hip pain 3/10, SOB, knee pain 3/10, SOB  SOB    Resting HR  90 bpm  87 bpm    Resting BP  112/64  130/80    Resting Oxygen Saturation   97 %  98 %    Exercise Oxygen Saturation  during 6 min walk  95 %  96 %    Max Ex. HR  125 bpm  111 bpm    Max Ex. BP  146/64  136/64    2 Minute Post BP  134/70  134/70      Interval HR   1 Minute HR  99  99    2 Minute HR  119  96    3 Minute HR  114  100    4 Minute HR  102  103    5 Minute HR  125  111    6 Minute HR  98  110    2 Minute Post HR  92  93    Interval Heart Rate?  Yes  Yes      Interval Oxygen   Interval Oxygen?  Yes  Yes    Baseline Oxygen Saturation %  97 %  98 %    1 Minute Oxygen Saturation %  96 %  98 %    1 Minute Liters of Oxygen  0 L Room Air  0 L    2 Minute Oxygen Saturation %  95 %  98 %    2 Minute Liters of Oxygen  0 L  0 L    3 Minute Oxygen Saturation %  96 %  97 %    3 Minute Liters of Oxygen  0 L  0 L    4 Minute Oxygen Saturation %  95 %  96 %    4 Minute Liters of Oxygen  0 L  0 L    5 Minute Oxygen Saturation %  95 %  98 %    5 Minute Liters of Oxygen  0 L  0 L    6 Minute Oxygen Saturation %  96 %  97 %    6 Minute Liters of Oxygen  0 L  0 L    2 Minute Post Oxygen Saturation %  97 %  100 %    2 Minute Post Liters of Oxygen  0 L  0 L       Psychological, QOL, Others - Outcomes: PHQ 2/9: Depression screen [facility] 2/9 02/25/2020 02/03/2020 01/17/2020 10/05/2019 02/18/2019  Decreased Interest 1 0 1 1 0  Down, Depressed, Hopeless 0 0 1 3 0  PHQ - 2 Score 1 0 2 4 0  Altered sleeping 1 0 3 0 -  Tired, decreased energy 3 0 3 3 -  Change in appetite 1 0 2 0 -  Feeling bad or failure about yourself  3 0 0 0 -  Trouble concentrating 0 0 0 1 -  Moving slowly or fidgety/restless 3 0 1 3 -  Suicidal thoughts 0 0 0 0 -   PHQ-9 Score 12 0 11 11 -  Difficult doing work/chores Very difficult Not difficult at all Not difficult at all Somewhat difficult -  Some recent data might be hidden    Quality of Life:   Personal Goals: Goals established at orientation with interventions provided to work toward goal. Personal Goals and Risk Factors at Admission - 10/05/19 1459      Core Components/Risk Factors/Patient Goals on Admission    Weight Management  Yes;Weight Loss    Intervention  Weight Management: Develop a combined nutrition and exercise program designed to reach desired caloric intake, while maintaining appropriate intake of nutrient and fiber, sodium and fats, and appropriate energy expenditure required for the weight goal.;Weight Management: Provide education and appropriate resources to help participant work on and attain dietary goals.;Weight Management/Obesity: Establish reasonable short term and long term weight goals.;Obesity: Provide education and appropriate resources to help participant work on and attain dietary goals.    Admit Weight  244 lb 11.2 oz (111 kg)    Goal Weight: Short Term  235 lb (106.6 kg)    Goal Weight: Long Term  230 lb (104.3 kg)    Expected Outcomes  Short Term: Continue to assess and modify interventions until short term weight is achieved;Weight Maintenance: Understanding of the daily nutrition guidelines, which  includes 25-35% calories from fat, 7% or less cal from saturated fats, less than 266m cholesterol, less than 1.5gm of sodium, & 5 or more servings of fruits and vegetables daily;Weight Loss: Understanding of general recommendations for a balanced deficit meal plan, which promotes 1-2 lb weight loss per week and includes a negative energy balance of [phone] kcal/d;Understanding recommendations for meals to include 15-35% energy as protein, 25-35% energy from fat, 35-60% energy from carbohydrates, less than 2068mof dietary cholesterol, 20-35 gm of total fiber  daily;Understanding of distribution of calorie intake throughout the day with the consumption of 4-5 meals/snacks;Long Term: Adherence to nutrition and physical activity/exercise program aimed toward attainment of established weight goal    Tobacco Cessation  Yes    Intervention  Assist the participant in steps to quit. Provide individualized education and counseling about committing to Tobacco Cessation, relapse prevention, and pharmacological support that can be provided by physician.;OfAdvice workerassist with locating and accessing local/national Quit Smoking programs, and support quit date choice.    Expected Outcomes  Short Term: Will demonstrate readiness to quit, by selecting a quit date.;Long Term: Complete abstinence from all tobacco products for at least 12 months from quit date.;Short Term: Will quit all tobacco product use, adhering to prevention of relapse plan.    Improve shortness of breath with ADL's  Yes    Intervention  Provide education, individualized exercise plan and daily activity instruction to help decrease symptoms of SOB with activities of daily living.    Expected Outcomes  Short Term: Improve cardiorespiratory fitness to achieve a reduction of symptoms when performing ADLs;Long Term: Be able to perform more ADLs without symptoms or delay the onset of symptoms    Hypertension  Yes    Intervention  Provide education on lifestyle modifcations including regular physical activity/exercise, weight management, moderate sodium restriction and increased consumption of fresh fruit, vegetables, and low fat dairy, alcohol moderation, and smoking cessation.;Monitor prescription use compliance.    Expected Outcomes  Short Term: Continued assessment and intervention until BP is < 140/9020mG in hypertensive participants. < 130/66m42m in hypertensive participants with diabetes, heart failure or chronic kidney disease.;Long Term: Maintenance of blood pressure at goal levels.     Lipids  Yes    Intervention  Provide education and support for participant on nutrition & aerobic/resistive exercise along with prescribed medications to achieve LDL <70mg15mL >40mg.60mExpected Outcomes  Short Term: Participant states understanding of desired cholesterol values and is compliant with medications prescribed. Participant is following exercise prescription and nutrition guidelines.;Long Term: Cholesterol controlled with medications as prescribed, with individualized exercise RX and with personalized nutrition plan. Value goals: LDL < 70mg, 7m> 40 mg.        Personal Goals Discharge: Goals and Risk Factor Review    Row Name 11/11/19 0817 12/02/19 0840 01/17/20 0838   [id number]  Core Components/Risk Factors/Patient Goals Review   Personal Goals Review  Weight Management/Obesity;Improve shortness of breath with ADL's;Hypertension;Lipids  Weight Management/Obesity;Improve shortness of breath with ADL's;Hypertension;Lipids  Weight Management/Obesity;Improve shortness of breath with ADL's     Review  Art hasnt been checking BP at home.  [facility]  He does have a monitor at home.  He will start checking at home at least once a day.  His weight is holding at 247.  He does stretch when he gets up.  He can play with his grandkids for short periods.  He  would like to be able to play more with them.  Art has an appointment with Dr. Fletcher Anon next week for a follow up.  He should get a good report.  His weight is creeping up and he hopes its muscle as he is feeling stronger overall.  He only eats twice a day as he doesn't move as much.  His pressures have been good and breathing is improving.  He has found that he can now go up the stairs!  Patients weight is up since the start of the program. He wants to work on weight loss. His shortness of breath has been a little worse when showering and getting ready for the day. He is feeling of in general but gets short of breath when  doing certain activities.     Expected Outcomes  Short :  walk track at [employer] or the mall 2 days per week Long :  walk up and down stairs without getting short of breath  Short: Continue to work on weight loss.  Long: Continue to exericse for weight loss and SOB.  Short: Attend [facility] regularly to improve shortness of breath with ADL's. Long: maintain independence with ADL's        Exercise Goals and Review: Exercise Goals    Row Name 10/05/19 1455             Exercise Goals   Increase Physical Activity  Yes       Intervention  Provide advice, education, support and counseling about physical activity/exercise needs.;Develop an individualized exercise prescription for aerobic and resistive training based on initial evaluation findings, risk stratification, comorbidities and participant's personal goals.       Expected Outcomes  Short Term: Attend rehab on a regular basis to increase amount of physical activity.;Long Term: Add in home exercise to make exercise part of routine and to increase amount of physical activity.;Long Term: Exercising regularly at least 3-5 days a week.       Increase Strength and Stamina  Yes       Intervention  Provide advice, education, support and counseling about physical activity/exercise needs.;Develop an individualized exercise prescription for aerobic and resistive training based on initial evaluation findings, risk stratification, comorbidities and participant's personal goals.       Expected Outcomes  Short Term: Increase workloads from initial exercise prescription for resistance, speed, and METs.;Short Term: Perform resistance training exercises routinely during rehab and add in resistance training at home;Long Term: Improve cardiorespiratory fitness, muscular endurance and strength as measured by increased METs and functional capacity (6MWT)       Able to understand and use rate of perceived exertion (RPE) scale  Yes       Intervention  Provide education  and explanation on how to use RPE scale       Expected Outcomes  Long Term:  Able to use RPE to guide intensity level when exercising independently;Short Term: Able to use RPE daily in rehab to express subjective intensity level       Able to understand and use Dyspnea scale  Yes       Intervention  Provide education and explanation on how to use Dyspnea scale       Expected Outcomes  Short Term: Able to use Dyspnea scale daily in rehab to express subjective sense of shortness of breath during exertion;Long Term: Able to use Dyspnea scale to guide intensity level when exercising independently       Knowledge and understanding of Target Heart  Rate Range (THRR)  Yes       Intervention  Provide education and explanation of THRR including how the numbers were predicted and where they are located for reference       Expected Outcomes  Short Term: Able to state/look up THRR;Short Term: Able to use daily as guideline for intensity in rehab;Long Term: Able to use THRR to govern intensity when exercising independently       Able to check pulse independently  Yes       Intervention  Provide education and demonstration on how to check pulse in carotid and radial arteries.;Review the importance of being able to check your own pulse for safety during independent exercise       Expected Outcomes  Short Term: Able to explain why pulse checking is important during independent exercise;Long Term: Able to check pulse independently and accurately       Understanding of Exercise Prescription  Yes       Intervention  Provide education, explanation, and written materials on patient's individual exercise prescription       Expected Outcomes  Short Term: Able to explain program exercise prescription;Long Term: Able to explain home exercise prescription to exercise independently          Exercise Goals Re-Evaluation: Exercise Goals Re-Evaluation    Row Name 10/06/19 0857 10/08/19 0920 10/21/19 1344 11/05/19 1006 12/01/19  1049     Exercise Goal Re-Evaluation   Exercise Goals Review  Able to understand and use rate of perceived exertion (RPE) scale;Able to understand and use Dyspnea scale;Knowledge and understanding of Target Heart Rate Range (THRR);Understanding of Exercise Prescription  Able to understand and use rate of perceived exertion (RPE) scale;Knowledge and understanding of Target Heart Rate Range (THRR);Understanding of Exercise Prescription  Able to understand and use rate of perceived exertion (RPE) scale;Able to understand and use Dyspnea scale;Knowledge and understanding of Target Heart Rate Range (THRR);Able to check pulse independently;Understanding of Exercise Prescription  Increase Physical Activity;Increase Strength and Stamina;Understanding of Exercise Prescription  Increase Physical Activity;Increase Strength and Stamina;Understanding of Exercise Prescription   Comments  Reviewed RPE and dyspnea scale, THR and program prescription with pt today.  Pt voiced understanding and was given a copy of goals to take home.  Reviewed RPE scale, THR and program prescription with pt today.  Pt voiced understanding and was given a copy of goals to take home.  Art is tolerating exercise well and will work on Catering manager on TM.  Staff will monitor progress.  Art has been doing well with rehab.  He has been holding steady at 2.1 METS on T5 NuStep and 2.8 METs on XR.  We will start to move up his workloads and continue to montior his progress.  Art has been doing well in rehab.  He reduced his workloads some yesterday as he had been out sick the previous week.  He is glad to be back to class.  We will continue to monitor his progress.   Expected Outcomes  Short: Use RPE and dyspnea scales daily to regulate intensity. Long: Follow program prescription in THR.  Short: Use RPE daily to regulate intensity. Long: Follow program prescription in THR.  Short - exercise consistently Long - increase overall stamina  Short:  Increase workloads. Long: Continue to improve stamina.  Short: Return to regular attendance.  Long: Continue to improve strength and stamina.   [place] Name 12/02/19 9458 12/14/19 1456 12/29/19 1325 01/13/20 1320 01/28/20 1335     Exercise Goal  Re-Evaluation   Exercise Goals Review  Increase Physical Activity;Increase Strength and Stamina;Understanding of Exercise Prescription  Increase Physical Activity;Increase Strength and Stamina;Able to understand and use rate of perceived exertion (RPE) scale;Able to understand and use Dyspnea scale;Knowledge and understanding of Target Heart Rate Range (THRR);Able to check pulse independently;Understanding of Exercise Prescription  --  Increase Physical Activity;Increase Strength and Stamina;Able to understand and use rate of perceived exertion (RPE) scale;Able to understand and use Dyspnea scale;Knowledge and understanding of Target Heart Rate Range (THRR);Able to check pulse independently;Understanding of Exercise Prescription  Increase Physical Activity;Increase Strength and Stamina;Able to understand and use rate of perceived exertion (RPE) scale;Able to understand and use Dyspnea scale;Knowledge and understanding of Target Heart Rate Range (THRR);Able to check pulse independently;Understanding of Exercise Prescription   Comments  Art is doing well in rehab.  His exercise at home has been chasing his 4 and 57 yo grandkids.  Reviewed home exercise with pt today.  Pt plans to walking at track at school for exercise.  Reviewed THR, pulse, RPE, sign and symptoms, and when to call 911 or MD.  Also discussed weather considerations and indoor options.  Pt voiced understanding.  Art is back up to level 8 on RB.   He works at level 5 on [place].  He will progress more now that we are back to 2-3 days per week.  Out since last review with COVID-19  This is Arts first week back after being out.  He tolerated exercise well.  Art was at RPE 9 on bike - we discussed increasing resistance or  RPM to be more challenging.   Expected Outcomes  Short: Walk consistently one extra day a week.  Long: Continue to improve stamina.  Short : exercsie consistently 3 days per week Long : increase MET level  --  Short:  attend consistently Long: improve overall stamina  Short : continue to work in challenging RPE 11-15 Long: increase overall MET level   Row Name 02/09/20 1307 02/22/20 1500 02/23/20 0946         Exercise Goal Re-Evaluation   Exercise Goals Review  Increase Physical Activity;Increase Strength and Stamina;Able to understand and use rate of perceived exertion (RPE) scale;Able to understand and use Dyspnea scale;Knowledge and understanding of Target Heart Rate Range (THRR);Able to check pulse independently;Understanding of Exercise Prescription  Increase Physical Activity;Increase Strength and Stamina;Understanding of Exercise Prescription  Increase Physical Activity;Increase Strength and Stamina;Understanding of Exercise Prescription     Comments  Art has been working at RPE 11-13 and uses 6 lb weights for strength work.  Art has officailly switched classes to earlier morning as he finishes up.  We expect to see an improvement in his post 6MWT.  He has taken to the BioStep very well.  He is planning to continue to exercise by walking at home.  Art improved post 6MWT by 19.7 %!!  He is planning to continue to walk for exericse at home.     Expected Outcomes  Short:  continue to attend consistently Long:continue to build stamina  Short: Improve post 6MWT  Long: Continue to exercise independently  Short: Graduate!!  Long: Continue to exercise indenpendently.        Nutrition & Weight - Outcomes: Pre Biometrics - 10/05/19 1455      Pre Biometrics   Height  5' 11.1" (1.806 m)    Weight  244 lb 11.2 oz (111 kg)    BMI (Calculated)  34.03    Single Leg Stand  3.12 seconds      Post Biometrics - 02/23/20 0944       Post  Biometrics   Height  5' 11.1" (1.806 m)    Weight  250 lb 11.2 oz  (113.7 kg)    BMI (Calculated)  34.87       Nutrition: Nutrition Therapy & Goals - 11/18/19 1401      Nutrition Therapy   Diet  Low Na, HH, DM    Protein (specify units)  90g    Fiber  30 grams    Whole Grain Foods  3 servings    Saturated Fats  12 max. grams    Fruits and Vegetables  5 servings/day    Sodium  1.5 grams      Personal Nutrition Goals   Nutrition Goal  ST: Add vegetables into burrito LT: like to get back to shape he was in 5 years ago, improve breathing and SOB    Comments  A1c. Pt using stevia for sweet tea instead of sugar. ~36 eggs/week. 2 scrambled egg burrito daily. May have peanut butter sandwich or some cookies. Sometimes have pasta salad with olive apple. Pt reports not salting anything. 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 Pt reports also includes avocado or berries and other fruit. Eats roasted unsalted nuts. Discussed HH eating. Suggested adding vegetables to burritos and making burritos whole wheat. Pt chose adding vegetables.      Intervention Plan   Intervention  Prescribe, educate and counsel regarding individualized specific dietary modifications aiming towards targeted core components such as weight, hypertension, lipid management, diabetes, heart failure and other comorbidities.;Nutrition handout(s) given to patient.    Expected Outcomes  Short Term Goal: Understand basic principles of dietary content, such as calories, fat, sodium, cholesterol and nutrients.;Short Term Goal: A plan has been developed with personal nutrition goals set during dietitian appointment.;Long Term Goal: Adherence to prescribed nutrition plan.       Nutrition Discharge: Nutrition Assessments - 02/25/20 0940      MEDFICTS Scores   Pre Score  77    Post Score  71    Score Difference  -6       Education Questionnaire Score: Knowledge Questionnaire Score - 02/25/20 0940      Knowledge Questionnaire Score   Pre Score  16/18 education focus: O2 safety, ADLs    Post  Score  15/18       Goals reviewed with patient; copy given to patient.

## 2020-03-07 ENCOUNTER — Encounter: Payer: Self-pay | Admitting: *Deleted

## 2020-03-07 DIAGNOSIS — J4550 Severe persistent asthma, uncomplicated: Secondary | ICD-10-CM

## 2020-03-07 NOTE — Progress Notes (Signed)
Pulmonary Individual Treatment Plan  Patient Details  Name: Cody Day MRN: [id number] Date of Birth: [birth year] Referring Provider:     Pulmonary Rehab from 10/05/2019 in [facility] Cardiac and Pulmonary Rehab  Referring Provider  [name] MD      Initial Encounter Date:    Pulmonary Rehab from 10/05/2019 in [facility] Cardiac and Pulmonary Rehab  Date  10/05/19      Visit Diagnosis: Severe persistent chronic asthma without complication  Patient's Home Medications on Admission:  Current Outpatient Medications:  .  albuterol (VENTOLIN HFA) 108 (90 Base) MCG/ACT inhaler, Inhale 2 puffs into the lungs every 4 (four) hours as needed for wheezing or shortness of breath., Disp: 1 Inhaler, Rfl: 10 .  allopurinol (ZYLOPRIM) 300 MG tablet, TAKE 1 TABLET BY MOUTH DAILY, Disp: 90 tablet, Rfl: 0 .  Ascorbic Acid (VITAMIN C) 1000 MG tablet, Take 1,000 mg by mouth daily., Disp: , Rfl:  .  Cholecalciferol (VITAMIN D-3) 125 MCG (5000 UT) TABS, Take 1 tablet by mouth daily., Disp: , Rfl:  .  citalopram (CELEXA) 40 MG tablet, Take 1 tablet (40 mg total) by mouth daily., Disp: 90 tablet, Rfl: 1 .  fluticasone (FLOVENT HFA) 220 MCG/ACT inhaler, Inhale 2 puffs into the lungs 2 (two) times daily., Disp: 1 Inhaler, Rfl: 5 .  lisinopril (ZESTRIL) 20 MG tablet, TAKE 1 TABLET BY MOUTH DAILY, Disp: 90 tablet, Rfl: 0 .  montelukast (SINGULAIR) 10 MG tablet, TAKE 1 TABLET BY MOUTH DAILY, Disp: 90 tablet, Rfl: 0 .  Multiple Vitamin (MULTIVITAMIN WITH MINERALS) TABS tablet, Take 1 tablet by mouth daily., Disp: 30 tablet, Rfl: 2 .  Multiple Vitamins-Minerals (ZINC PO), Take 1 tablet by mouth daily., Disp: , Rfl:  .  simvastatin (ZOCOR) 20 MG tablet, TAKE 1 TABLET BY MOUTH DAILY, Disp: 90 tablet, Rfl: 3 .  STIOLTO RESPIMAT 2.5-2.5 MCG/ACT AERS, USE 2 INHALATIONS EVERY DAY, Disp: 4 g, Rfl: 5 .  testosterone cypionate (DEPOTESTOSTERONE CYPIONATE) 200 MG/ML injection, INJECT 0.3 ML (60 MG TOTAL) INTO THE MUSCLE ONCE A  WEEK, Disp: 10 mL, Rfl: 0  Past Medical History: Past Medical History:  Diagnosis Date  . Achilles tendon rupture 10/11   and repair  . Adenomatous polyp of colon 04/06/2010   Adenomatous polyps 3 in the descending colon, times one in the ascending colon [facility])  . Asthma   . COPD (chronic obstructive pulmonary disease) ([place])   . Gout   . Hemorrhoids   . Hypercholesteremia   . Hypertension   . Sleep apnea    doesn't wear CPAP  . Syncope 10/11   in settin gof asthma exacerbation w coughing. Ech (10/11): EF > 55%, mild LVH, grade I diastolic dysfunction, nomral RV size and systolic function,. normal valves. Carotid US (10/11): minimal disease    Tobacco Use: Social History   Tobacco Use  Smoking Status Former Smoker  . Packs/day: 0.25  . Years: 35.00  . Pack years: 8.75  . Types: Cigarettes  . Quit date: 10/22/1995  . Years since quitting: 24.3  Smokeless Tobacco Current User  . Types: Chew  Tobacco Comment   rarely smoked cigarettes and cigars.  states Cody Day smoked drugs mostly    Labs: Recent Review Flowsheet Data    Labs for ITP Cardiac and Pulmonary Rehab Latest Ref Rng & Units 04/01/2018 07/20/2018 02/15/2019 08/30/2019 02/22/2020   Cholestrol 0 - 200 mg/dL 168 - 136 123 141   LDLCALC 0 - 99 mg/dL 75 - 71 57 73  LDLDIRECT mg/dL - - - - -   HDL >39.00 mg/dL 81.60 - 44.50 51.00 49.30   Trlycerides 0.0 - 149.0 mg/dL 58.0 - 103.0 76.0 92.0   Hemoglobin A1c 4.6 - 6.5 % 6.1 5.7(H) 6.8(H) 6.3 6.5   TCO2 0 - 100 mmol/L - - - - -       Pulmonary Assessment Scores: Pulmonary Assessment Scores    Row Name 02/25/20 0941         ADL UCSD   ADL Phase  Exit     SOB Score total  66     Rest  1     Walk  3     Stairs  3     Bath  3     Dress  3     Shop  2       CAT Score   CAT Score  27       mMRC Score   mMRC Score  2        UCSD: Self-administered rating of dyspnea associated with activities of daily living (ADLs) 6-point scale (0 = "not at all" to 5 =  "maximal or unable to do because of breathlessness")  Scoring Scores range from 0 to 120.  Minimally important difference is 5 units  CAT: CAT can identify the health impairment of COPD patients and is better correlated with disease progression.  CAT Day a scoring range of zero to 40. The CAT score is classified into four groups of low (less than 10), medium (10 - 20), high (21-30) and very high (31-40) based on the impact level of disease on health status. A CAT score over 10 suggests significant symptoms.  A worsening CAT score could be explained by an exacerbation, poor medication adherence, poor inhaler technique, or progression of COPD or comorbid conditions.  CAT MCID is 2 points  mMRC: mMRC (Modified Medical Research Council) Dyspnea Scale is used to assess the degree of baseline functional disability in patients of respiratory disease due to dyspnea. No minimal important difference is established. A decrease in score of 1 point or greater is considered a positive change.   Pulmonary Function Assessment:   Exercise Target Goals: Exercise Program Goal: Individual exercise prescription set using results from initial 6 min walk test and THRR while considering  patient's activity barriers and safety.   Exercise Prescription Goal: Initial exercise prescription builds to 30-45 minutes a day of aerobic activity, 2-3 days per week.  Home exercise guidelines will be given to patient during program as part of exercise prescription that the participant will acknowledge.  Education: Aerobic Exercise & Resistance Training: - Gives group verbal and written instruction on the various components of exercise. Focuses on aerobic and resistive training programs and the benefits of this training and how to safely progress through these programs..   Education: Exercise & Equipment Safety: - Individual verbal instruction and demonstration of equipment use and safety with use of the equipment.    Pulmonary Rehab from 10/05/2019 in [facility] Cardiac and Pulmonary Rehab  Date  10/05/19  Educator  [facility]  Instruction Review Code  1- Verbalizes Understanding      Education: Exercise Physiology & General Exercise Guidelines: - Group verbal and written instruction with models to review the exercise physiology of the cardiovascular system and associated critical values. Provides general exercise guidelines with specific guidelines to those with heart or lung disease.    Education: Flexibility, Balance, Mind/Body Relaxation: Provides group verbal/written instruction on the benefits of  flexibility and balance training, including mind/body exercise modes such as yoga, pilates and tai chi.  Demonstration and skill practice provided.   Activity Barriers & Risk Stratification:   6 Minute Walk: 6 Minute Walk    Row Name 02/23/20 0941         6 Minute Walk   Phase  Discharge     Distance  1190 feet     Distance % Change  19.7 %     Distance Feet Change  196 ft     Walk Time  6 minutes     # of Rest Breaks  0     MPH  2.25     METS  2.82     RPE  13     Perceived Dyspnea   3     VO2 Peak  9.89     Symptoms  Yes (comment)     Comments  SOB     Resting HR  87 bpm     Resting BP  130/80     Resting Oxygen Saturation   98 %     Exercise Oxygen Saturation  during 6 min walk  96 %     Max Ex. HR  111 bpm     Max Ex. BP  136/64     2 Minute Post BP  134/70       Interval HR   1 Minute HR  99     2 Minute HR  96     3 Minute HR  100     4 Minute HR  103     5 Minute HR  111     6 Minute HR  110     2 Minute Post HR  93     Interval Heart Rate?  Yes       Interval Oxygen   Interval Oxygen?  Yes     Baseline Oxygen Saturation %  98 %     1 Minute Oxygen Saturation %  98 %     1 Minute Liters of Oxygen  0 L     2 Minute Oxygen Saturation %  98 %     2 Minute Liters of Oxygen  0 L     3 Minute Oxygen Saturation %  97 %     3 Minute Liters of Oxygen  0 L     4 Minute Oxygen  Saturation %  96 %     4 Minute Liters of Oxygen  0 L     5 Minute Oxygen Saturation %  98 %     5 Minute Liters of Oxygen  0 L     6 Minute Oxygen Saturation %  97 %     6 Minute Liters of Oxygen  0 L     2 Minute Post Oxygen Saturation %  100 %     2 Minute Post Liters of Oxygen  0 L       Oxygen Initial Assessment:   Oxygen Re-Evaluation:   Oxygen Discharge (Final Oxygen Re-Evaluation):   Initial Exercise Prescription:   Perform Capillary Blood Glucose checks as needed.  Exercise Prescription Changes: Exercise Prescription Changes    Row Name 01/13/20 1300 01/28/20 1300 02/09/20 1300 02/22/20 1500       Response to Exercise   Blood Pressure (Admit)  124/82  140/74  142/82  136/70    Blood Pressure (Exercise)  164/74  172/78  162/80  134/74    Blood  Pressure (Exit)  132/86  126/66  142/80  126/70    Heart Rate (Admit)  87 bpm  85 bpm  88 bpm  75 bpm    Heart Rate (Exercise)  111 bpm  108 bpm  104 bpm  101 bpm    Heart Rate (Exit)  98 bpm  90 bpm  93 bpm  83 bpm    Oxygen Saturation (Admit)  97 %  96 %  97 %  98 %    Oxygen Saturation (Exercise)  96 %  96 %  97 %  96 %    Oxygen Saturation (Exit)  97 %  97 %  96 %  96 %    Rating of Perceived Exertion (Exercise)  _0 Perceived Dyspnea (Exercise)  _1 Symptoms  --  --  --  SOB    Duration  Continue with 30 min of aerobic exercise without signs/symptoms of physical distress.  Continue with 30 min of aerobic exercise without signs/symptoms of physical distress.  Continue with 30 min of aerobic exercise without signs/symptoms of physical distress.  Continue with 30 min of aerobic exercise without signs/symptoms of physical distress.    Intensity  THRR unchanged  THRR unchanged  THRR unchanged  THRR unchanged      Progression   Progression  Continue to progress workloads to maintain intensity without signs/symptoms of physical distress.  Continue to progress workloads to maintain intensity without  signs/symptoms of physical distress.  Continue to progress workloads to maintain intensity without signs/symptoms of physical distress.  Continue to progress workloads to maintain intensity without signs/symptoms of physical distress.    Average METs  3  2.8  2.9  3.65      Resistance Training   Training Prescription  Yes  Yes  Yes  Yes    Weight  6 lb  6 lb  6 lb  6 lb    Reps  10-15  10-15  10-15  10-15      Interval Training   Interval Training  No  No  No  No      Treadmill   MPH  2.1  --  --  2.7    Grade  0  --  --  0    Minutes  15  --  --  15    METs  2.61  --  --  3.07      Recumbant Bike   Level  --  8  --  8    RPM  --  50  --  --    Watts  --  34  --  53    Minutes  --  15  --  15    METs  --  2.9  --  3.51      NuStep   Level  --  --  5  5    SPM  --  --  80  --    Minutes  --  --  15  15    METs  --  --  2.9  5      REL-XR   Level  5  --  --  5    Speed  50  --  --  --    Minutes  15  --  --  15      Home Exercise Plan   Day to continue exercise  at  --  --  --  Home (comment) walking    Frequency  --  --  --  Add 2 additional days to program exercise sessions.    Initial Home Exercises Provided  --  --  --  12/02/19       Exercise Comments: Exercise Comments    Row Name 03/03/20 0745           Exercise Comments  Cody Day graduated today from  rehab with 36 sessions completed.  Details of the patient's exercise prescription and what Cody Day needs to do in order to continue the prescription and progress were discussed with patient.  Patient Day given a copy of prescription and goals.  Patient verbalized understanding.  Cody Day to continue to exercise by joining the [employer] or another gym.          Exercise Goals and Review:   Exercise Goals Re-Evaluation : Exercise Goals Re-Evaluation    Row Name 01/13/20 1320 01/28/20 1335 02/09/20 1307 02/22/20 1500 02/23/20 0946     Exercise Goal Re-Evaluation   Exercise Goals Review  Increase Physical  Activity;Increase Strength and Stamina;Able to understand and use rate of perceived exertion (RPE) scale;Able to understand and use Dyspnea scale;Knowledge and understanding of Target Heart Rate Range (THRR);Able to check pulse independently;Understanding of Exercise Prescription  Increase Physical Activity;Increase Strength and Stamina;Able to understand and use rate of perceived exertion (RPE) scale;Able to understand and use Dyspnea scale;Knowledge and understanding of Target Heart Rate Range (THRR);Able to check pulse independently;Understanding of Exercise Prescription  Increase Physical Activity;Increase Strength and Stamina;Able to understand and use rate of perceived exertion (RPE) scale;Able to understand and use Dyspnea scale;Knowledge and understanding of Target Heart Rate Range (THRR);Able to check pulse independently;Understanding of Exercise Prescription  Increase Physical Activity;Increase Strength and Stamina;Understanding of Exercise Prescription  Increase Physical Activity;Increase Strength and Stamina;Understanding of Exercise Prescription   Comments  This is Cody Day first week back after being out.  Cody Day tolerated exercise well.  Cody Day at RPE 9 on bike - we discussed increasing resistance or RPM to be more challenging.  Cody Day been working at [employer] 11-13 and uses 6 lb weights for strength work.  Cody Day officailly switched classes to earlier morning as Cody Day finishes up.  We expect to see an improvement in his post 6MWT.  Cody Day Day taken to the Bio[employer] very well.  Cody Day is planning to continue to exercise by walking at home.  Cody Day post 6MWT by 19.7 %!!  Cody Day is planning to continue to walk for exericse at home.   Expected Outcomes  Short:  attend consistently Long: improve overall stamina  Short : continue to work in challenging RPE 11-15 Long: increase overall MET level  Short:  continue to attend consistently Long:continue to build stamina  Short: Improve post 6MWT  Long: Continue to exercise  independently  Short: Graduate!!  Long: Continue to exercise indenpendently.      Discharge Exercise Prescription (Final Exercise Prescription Changes): Exercise Prescription Changes - 02/22/20 1500      Response to Exercise   Blood Pressure (Admit)  136/70    Blood Pressure (Exercise)  134/74    Blood Pressure (Exit)  126/70    Heart Rate (Admit)  75 bpm    Heart Rate (Exercise)  101 bpm    Heart Rate (Exit)  83 bpm    Oxygen Saturation (Admit)  98 %    Oxygen Saturation (Exercise)  96 %    Oxygen Saturation (Exit)  96 %    Rating of Perceived Exertion (Exercise)  13    Perceived Dyspnea (Exercise)  3    Symptoms  SOB    Duration  Continue with 30 min of aerobic exercise without signs/symptoms of physical distress.    Intensity  THRR unchanged      Progression   Progression  Continue to progress workloads to maintain intensity without signs/symptoms of physical distress.    Average METs  3.65      Resistance Training   Training Prescription  Yes    Weight  6 lb    Reps  10-15      Interval Training   Interval Training  No      Treadmill   MPH  2.7    Grade  0    Minutes  15    METs  3.07      Recumbant Bike   Level  8    Watts  53    Minutes  15    METs  3.51      NuStep   Level  5    Minutes  15    METs  5      REL-XR   Level  5    Minutes  15      Home Exercise Plan   Day to continue exercise at  Home (comment)   walking   Frequency  Add 2 additional days to program exercise sessions.    Initial Home Exercises Provided  12/02/19       Nutrition:  Target Goals: Understanding of nutrition guidelines, daily intake of sodium <1581m, cholesterol <2036m calories 30% from fat and 7% or less from saturated fats, daily to have 5 or more servings of fruits and vegetables.  Education: Controlling Sodium/Reading Food Labels -Group verbal and written material supporting the discussion of sodium use in heart healthy nutrition. Review and explanation with  models, verbal and written materials for utilization of the food label.   Education: General Nutrition Guidelines/Fats and Fiber: -Group instruction provided by verbal, written material, models and posters to present the general guidelines for heart healthy nutrition. Gives an explanation and review of dietary fats and fiber.   Biometrics:  PoBarbie Haggis 02/23/20 0944       Post  Biometrics   Height  5' 11.1" (1.806 m)    Weight  250 lb 11.2 oz (113.7 kg)    BMI (Calculated)  34.87       Nutrition Therapy Plan and Nutrition Goals:   Nutrition Assessments: Nutrition Assessments - 02/25/20 0940      MEDFICTS Scores   Pre Score  77    Post Score  71    Score Difference  -6       MEDIFICTS Score Key:          ?70 Need to make dietary changes          40-70 Heart Healthy Diet         ? 40 Therapeutic Level Cholesterol Diet  Nutrition Goals Re-Evaluation: Nutrition Goals Re-Evaluation    Row Name 01/24/20 0877825/12/21 0904           Goals   Nutrition Goal  ST: continue with current changes LT: like to get back to shape Cody Day Day in 5 years ago, improve breathing and SOB  ST: continue to make changes LT: like to get back to shape Cody Day Day in 5 years ago, improve breathing and SOB  Comment  Pt reports that post covid his appetite is ok, SOB more and tired more. Pt reports not eating as many burritos anymore (1 instead of 3). Honoring hunger. Now pt eating more oranges and drinking apple juice.  Continue to make small changes every 2 weeks to 30 days focusing on MyPlate and heart healthy eating as a guide. Pt Day reduced burrito consumption and added more fruit to his diet. Prior to rehab pt reports eating about 36 eggs/week, with reduction in burritos, his egg consumption Day also been reduced.      Expected Outcome  ST: continue with current changes  LT: like to get back to shape Cody Day Day in 5 years ago, improve breathing and SOB  ST: continue to make changes LT: like to get  back to shape Cody Day Day in 5 years ago, improve breathing and SOB         Nutrition Goals Discharge (Final Nutrition Goals Re-Evaluation): Nutrition Goals Re-Evaluation - 03/01/20 0904      Goals   Nutrition Goal  ST: continue to make changes LT: like to get back to shape Cody Day Day in 5 years ago, improve breathing and SOB    Comment  Continue to make small changes every 2 weeks to 30 days focusing on MyPlate and heart healthy eating as a guide. Pt Day reduced burrito consumption and added more fruit to his diet. Prior to rehab pt reports eating about 36 eggs/week, with reduction in burritos, his egg consumption Day also been reduced.    Expected Outcome  ST: continue to make changes LT: like to get back to shape Cody Day Day in 5 years ago, improve breathing and SOB       Psychosocial: Target Goals: Acknowledge presence or absence of significant depression and/or stress, maximize coping skills, provide positive support system. Participant is able to verbalize types and ability to use techniques and skills needed for reducing stress and depression.   Education: Depression - Provides group verbal and written instruction on the correlation between heart/lung disease and depressed mood, treatment options, and the stigmas associated with seeking treatment.   Education: Sleep Hygiene -Provides group verbal and written instruction about how sleep can affect your health.  Define sleep hygiene, discuss sleep cycles and impact of sleep habits. Review good sleep hygiene tips.    Education: Stress and Anxiety: - Provides group verbal and written instruction about the health risks of elevated stress and causes of high stress.  Discuss the correlation between heart/lung disease and anxiety and treatment options. Review healthy ways to manage with stress and anxiety.   Initial Review & Psychosocial Screening:   Quality of Life Scores:  Scores of 19 and below usually indicate a poorer quality of life in these  areas.  A difference of  2-3 points is a clinically meaningful difference.  A difference of 2-3 points in the total score of the Quality of Life Index Day been associated with significant improvement in overall quality of life, self-image, physical symptoms, and general health in studies assessing change in quality of life.  PHQ-9: Recent Review Flowsheet Data    Depression screen [facility] 2/9 02/25/2020 02/03/2020 01/17/2020 10/05/2019 02/18/2019   Decreased Interest 1 0 1 1 0   Down, Depressed, Hopeless 0 0 1 3 0   PHQ - 2 Score 1 0 2 4 0   Altered sleeping 1 0 3 0 -   Tired, decreased energy 3 0 3 3 -   Change in appetite 1 0  2 0 -   Feeling bad or failure about yourself  3 0 0 0 -   Trouble concentrating 0 0 0 1 -   Moving slowly or fidgety/restless 3 0 1 3 -   Suicidal thoughts 0 0 0 0 -   PHQ-9 Score 12 0 11 11 -   Difficult doing work/chores Very difficult Not difficult at all Not difficult at all Somewhat difficult -     Interpretation of Total Score  Total Score Depression Severity:  1-4 = Minimal depression, 5-9 = Mild depression, 10-14 = Moderate depression, 15-19 = Moderately severe depression, 20-27 = Severe depression   Psychosocial Evaluation and Intervention: Psychosocial Evaluation - 02/23/20 0947      Discharge Psychosocial Assessment & Intervention   Comments  Cody Day done well in rehab and is pleased with his progress.  Cody Day notes an improvement in his overall health.  Cody Day is walking better now and his weight is also getting back to where it Day before all of this happened.  Cody Day feels stronger and Day more stamina.  Cody Day Day to keep it going by going the mall to walk with his wife.       Psychosocial Re-Evaluation: Psychosocial Re-Evaluation    Row Name 01/17/20 [phone]             Psychosocial Re-Evaluation   Current issues with  History of Depression;Current Psychotropic Meds       Comments  Reviewed patient health questionnaire (PHQ-9) with patient for follow up.  Previously, patients score indicated signs/symptoms of depression.  Reviewed to see if patient is improving symptom wise while in program.  Score stayed the same patient states that it is because Cody Day Day had these problems and wants to see his grandkids grow up.       Expected Outcomes  Short: Continue to work toward an improvement in [place] scores by attending [facility] regularly. Long: Continue to improve stress and depression coping skills by talking with staff and attending [facility] regularly and work toward a positive mental state.       Interventions  Encouraged to attend Pulmonary Rehabilitation for the exercise       Continue Psychosocial Services   Follow up required by staff          Psychosocial Discharge (Final Psychosocial Re-Evaluation): Psychosocial Re-Evaluation - 01/17/20 0846      Psychosocial Re-Evaluation   Current issues with  History of Depression;Current Psychotropic Meds    Comments  Reviewed patient health questionnaire (PHQ-9) with patient for follow up. Previously, patients score indicated signs/symptoms of depression.  Reviewed to see if patient is improving symptom wise while in program.  Score stayed the same patient states that it is because Cody Day Day had these problems and wants to see his grandkids grow up.    Expected Outcomes  Short: Continue to work toward an improvement in [place] scores by attending [facility] regularly. Long: Continue to improve stress and depression coping skills by talking with staff and attending [facility] regularly and work toward a positive mental state.    Interventions  Encouraged to attend Pulmonary Rehabilitation for the exercise    Continue Psychosocial Services   Follow up required by staff       Education: Education Goals: Education classes will be provided on a weekly basis, covering required topics. Participant will state understanding/return demonstration of topics presented.  Learning Barriers/Preferences:   General Pulmonary  Education Topics:  Infection Prevention: - Provides verbal and written material to individual  with discussion of infection control including proper hand washing and proper equipment cleaning during exercise session.   Pulmonary Rehab from 10/05/2019 in [facility] Cardiac and Pulmonary Rehab  Date  10/05/19  Educator  [facility]  Instruction Review Code  1- Verbalizes Understanding      Falls Prevention: - Provides verbal and written material to individual with discussion of falls prevention and safety.   Pulmonary Rehab from 10/05/2019 in [facility] Cardiac and Pulmonary Rehab  Date  10/04/19  Educator  [facility]  Instruction Review Code  1- Verbalizes Understanding      Chronic Lung Diseases: - Group verbal and written instruction to review updates, respiratory medications, advancements in procedures and treatments. Discuss use of supplemental oxygen including available portable oxygen systems, continuous and intermittent flow rates, concentrators, personal use and safety guidelines. Review proper use of inhaler and spacers. Provide informative websites for self-education.    Energy Conservation: - Provide group verbal and written instruction for methods to conserve energy, plan and organize activities. Instruct on pacing techniques, use of adaptive equipment and posture/positioning to relieve shortness of breath.   Triggers and Exacerbations: - Group verbal and written instruction to review types of environmental triggers and ways to prevent exacerbations. Discuss weather changes, air quality and the benefits of nasal washing. Review warning signs and symptoms to help prevent infections. Discuss techniques for effective airway clearance, coughing, and vibrations.   AED/CPR: - Group verbal and written instruction with the use of models to demonstrate the basic use of the AED with the basic ABC's of resuscitation.   Anatomy and Physiology of the Lungs: - Group verbal and written instruction with the use of  models to provide basic lung anatomy and physiology related to function, structure and complications of lung disease.   Anatomy & Physiology of the Heart: - Group verbal and written instruction and models provide basic cardiac anatomy and physiology, with the coronary electrical and arterial systems. Review of Valvular disease and Heart Failure   Cardiac Medications: - Group verbal and written instruction to review commonly prescribed medications for heart disease. Reviews the medication, class of the drug, and side effects.   Other: -Provides group and verbal instruction on various topics (see comments)   Knowledge Questionnaire Score: Knowledge Questionnaire Score - 02/25/20 0940      Knowledge Questionnaire Score   Pre Score  16/18 education focus: O2 safety, ADLs    Post Score  15/18        Core Components/Risk Factors/Patient Goals at Admission:   Education:Diabetes - Individual verbal and written instruction to review signs/symptoms of diabetes, desired ranges of glucose level fasting, after meals and with exercise. Acknowledge that pre and post exercise glucose checks will be done for 3 sessions at entry of program.   Education: Know Your Numbers and Risk Factors: -Group verbal and written instruction about important numbers in your health.  Discussion of what are risk factors and how they play a role in the disease process.  Review of Cholesterol, Blood Pressure, Diabetes, and BMI and the role they play in your overall health.   Core Components/Risk Factors/Patient Goals Review:  Goals and Risk Factor Review    Row Name 01/17/20 [phone]             Core Components/Risk Factors/Patient Goals Review   Personal Goals Review  Weight Management/Obesity;Improve shortness of breath with ADL's       Review  Patients weight is up since the start of the program. Cody Day wants to work on  weight loss. His shortness of breath Day been a little worse when showering and getting ready  for the day. Cody Day is feeling of in general but gets short of breath when doing certain activities.       Expected Outcomes  Short: Attend [facility] regularly to improve shortness of breath with ADL's. Long: maintain independence with ADL's          Core Components/Risk Factors/Patient Goals at Discharge (Final Review):  Goals and Risk Factor Review - 01/17/20 0838      Core Components/Risk Factors/Patient Goals Review   Personal Goals Review  Weight Management/Obesity;Improve shortness of breath with ADL's    Review  Patients weight is up since the start of the program. Cody Day wants to work on weight loss. His shortness of breath Day been a little worse when showering and getting ready for the day. Cody Day is feeling of in general but gets short of breath when doing certain activities.    Expected Outcomes  Short: Attend [facility] regularly to improve shortness of breath with ADL's. Long: maintain independence with ADL's       ITP Comments: ITP Comments    Row Name 01/12/20 1013 02/09/20 1041 03/03/20 0746 03/07/20 0939     ITP Comments  30 day chart review completed. ITP sent to Dr Zachery Dakins Medical Director, for review,changes as needed and signature.  Day returned after hving COVID 19 virus  30 day chart review completed. ITP sent to Dr Zachery Dakins Medical Director, for review,changes as needed and signature. Continue with ITP if no changes requested  Discharge ITP sent and signed by Dr. Sabra Heck.  Discharge Summary routed to PCP and cardiologist.  Discharge ITP sent and signed by Dr. Sabra Heck.  Discharge Summary routed to PCP and cardiologist.       Comments: ITP DISCHARGED

## 2020-03-15 ENCOUNTER — Ambulatory Visit (INDEPENDENT_AMBULATORY_CARE_PROVIDER_SITE_OTHER): Payer: Medicare Other

## 2020-03-15 ENCOUNTER — Other Ambulatory Visit: Payer: Self-pay

## 2020-03-15 DIAGNOSIS — R0602 Shortness of breath: Secondary | ICD-10-CM

## 2020-03-15 MED ORDER — PERFLUTREN LIPID MICROSPHERE
1.0000 mL | INTRAVENOUS | Status: AC | PRN
  Administered 2020-03-15: 2 mL via INTRAVENOUS

## 2020-04-01 DIAGNOSIS — Z23 Encounter for immunization: Secondary | ICD-10-CM | POA: Diagnosis not present

## 2020-04-12 ENCOUNTER — Other Ambulatory Visit: Payer: Self-pay | Admitting: Family Medicine

## 2020-04-13 ENCOUNTER — Other Ambulatory Visit: Payer: Self-pay | Admitting: Family Medicine

## 2020-04-13 ENCOUNTER — Other Ambulatory Visit: Payer: Self-pay | Admitting: Internal Medicine

## 2020-04-13 DIAGNOSIS — J4520 Mild intermittent asthma, uncomplicated: Secondary | ICD-10-CM

## 2020-06-01 ENCOUNTER — Other Ambulatory Visit: Payer: Self-pay | Admitting: Family Medicine

## 2020-07-17 ENCOUNTER — Telehealth: Payer: Self-pay

## 2020-07-17 NOTE — Telephone Encounter (Signed)
The patient is due for his 5 year follow up for Colonoscopy. I have left a message for him to call back to let us know if he wants a referral to [facility] or if he would like to follow up with Dr Bary Castilla.

## 2020-08-24 ENCOUNTER — Other Ambulatory Visit: Payer: Self-pay | Admitting: Urology

## 2020-08-24 ENCOUNTER — Other Ambulatory Visit: Payer: Self-pay

## 2020-08-24 DIAGNOSIS — E291 Testicular hypofunction: Secondary | ICD-10-CM

## 2020-08-24 DIAGNOSIS — R972 Elevated prostate specific antigen [PSA]: Secondary | ICD-10-CM

## 2020-08-25 ENCOUNTER — Other Ambulatory Visit: Payer: Medicare Other

## 2020-08-25 ENCOUNTER — Other Ambulatory Visit: Payer: Self-pay

## 2020-08-25 ENCOUNTER — Telehealth: Payer: Self-pay | Admitting: Urology

## 2020-08-25 ENCOUNTER — Other Ambulatory Visit: Payer: Self-pay | Admitting: *Deleted

## 2020-08-25 DIAGNOSIS — E291 Testicular hypofunction: Secondary | ICD-10-CM

## 2020-08-25 MED ORDER — STIOLTO RESPIMAT 2.5-2.5 MCG/ACT IN AERS: INHALATION_SPRAY | RESPIRATORY_TRACT | 5 refills | Status: DC

## 2020-08-25 MED ORDER — SIMVASTATIN 20 MG PO TABS: 20.0000 mg | ORAL_TABLET | Freq: Every day | ORAL | 1 refills | Status: DC

## 2020-08-25 MED ORDER — LISINOPRIL 20 MG PO TABS: 20.0000 mg | ORAL_TABLET | Freq: Every day | ORAL | 1 refills | Status: DC

## 2020-08-25 MED ORDER — MONTELUKAST SODIUM 10 MG PO TABS: 10.0000 mg | ORAL_TABLET | Freq: Every day | ORAL | 1 refills | Status: DC

## 2020-08-25 MED ORDER — CITALOPRAM HYDROBROMIDE 40 MG PO TABS: 40.0000 mg | ORAL_TABLET | Freq: Every day | ORAL | 1 refills | Status: DC

## 2020-08-25 NOTE — Telephone Encounter (Signed)
Due for lab visit with PSA/testosterone/hematocrit this month.  Partial testosterone refill sent

## 2020-08-26 LAB — TESTOSTERONE: Testosterone: 100 ng/dL — ABNORMAL LOW (ref 264–916)

## 2020-08-26 LAB — HEMATOCRIT: Hematocrit: 46.9 % (ref 37.5–51.0)

## 2020-08-27 NOTE — Progress Notes (Signed)
Testosterone level was low at 100 however he had run out of medication and was overdue for an injection.

## 2020-08-28 ENCOUNTER — Encounter: Payer: Self-pay | Admitting: *Deleted

## 2020-08-28 ENCOUNTER — Telehealth: Payer: Self-pay | Admitting: *Deleted

## 2020-08-28 ENCOUNTER — Other Ambulatory Visit: Payer: Self-pay | Admitting: Family Medicine

## 2020-08-28 NOTE — Telephone Encounter (Signed)
Pt calling stating that the [facility] will not refill his rx because it's too soon. Per pt he lost 3 doses trying to get the needle back in the bottle and now he's out, pt would like Dr. Elenor Quinones to call the pharmacy to authorize new rx

## 2020-08-29 ENCOUNTER — Ambulatory Visit (INDEPENDENT_AMBULATORY_CARE_PROVIDER_SITE_OTHER): Payer: Medicare Other | Admitting: Family Medicine

## 2020-08-29 ENCOUNTER — Ambulatory Visit: Payer: Medicare Other | Admitting: Family Medicine

## 2020-08-29 ENCOUNTER — Encounter: Payer: Self-pay | Admitting: Family Medicine

## 2020-08-29 ENCOUNTER — Other Ambulatory Visit: Payer: Self-pay

## 2020-08-29 VITALS — BP 140/82 | HR 78 | Temp 98.8°F | Ht 71.0 in | Wt 240.0 lb

## 2020-08-29 DIAGNOSIS — I10 Essential (primary) hypertension: Secondary | ICD-10-CM

## 2020-08-29 DIAGNOSIS — Z23 Encounter for immunization: Secondary | ICD-10-CM | POA: Diagnosis not present

## 2020-08-29 DIAGNOSIS — J4551 Severe persistent asthma with (acute) exacerbation: Secondary | ICD-10-CM

## 2020-08-29 DIAGNOSIS — F321 Major depressive disorder, single episode, moderate: Secondary | ICD-10-CM | POA: Diagnosis not present

## 2020-08-29 DIAGNOSIS — I2510 Atherosclerotic heart disease of native coronary artery without angina pectoris: Secondary | ICD-10-CM | POA: Diagnosis not present

## 2020-08-29 NOTE — Patient Instructions (Signed)
Keep up great work on weight loss and walking.  Okay to stop citalopram to 20 mg if SE.

## 2020-08-29 NOTE — Progress Notes (Signed)
Chief Complaint  Patient presents with  . Follow-up    6 month    History of Present Illness: HPI   64 year old male presents for 6 month follow up.  Hypertension:   BP Readings from Last 3 Encounters:  08/29/20 (!) 150/94  02/29/20 (!) 152/80  02/23/20 131/77  Using medication without problems or lightheadedness: none Chest pain with exertion: none Edema:none Short of breath: improving some Average home BPs: not checking Other issues:    Mother passed away in last few weeks. She was unable to communicate.. had dementia.  MDD, stable on citalopram.. less SE on citalopram 20mg     Chronic asthma and COPD: Has been walking 3 miles a day in [place]...visiting for last 3 months. Breathing better there with less humidity. Wt Readings from Last 3 Encounters:  08/29/20 240 lb (108.9 kg)  02/29/20 252 lb 8 oz (114.5 kg)  02/23/20 250 lb (113.4 kg)     This visit occurred during the SARS-CoV-2 public health emergency.  Safety protocols were in place, including screening questions prior to the visit, additional usage of staff PPE, and extensive cleaning of exam room while observing appropriate contact time as indicated for disinfecting solutions.   COVID 19 screen:  No recent travel or known exposure to COVID19 The patient denies respiratory symptoms of COVID 19 at this time. The importance of social distancing was discussed today.     Review of Systems  Constitutional: Negative for chills and fever.  HENT: Negative for congestion and ear pain.   Eyes: Negative for pain and redness.  Respiratory: Positive for shortness of breath. Negative for cough.   Cardiovascular: Negative for chest pain, palpitations and leg swelling.  Gastrointestinal: Negative for abdominal pain, blood in stool, constipation, diarrhea, nausea and vomiting.  Genitourinary: Negative for dysuria.  Musculoskeletal: Negative for falls and myalgias.  Skin: Negative for rash.  Neurological: Negative  for dizziness.  Psychiatric/Behavioral: Negative for depression. The patient is not nervous/anxious.       Past Medical History:  Diagnosis Date  . Achilles tendon rupture 10/11   and repair  . Adenomatous polyp of colon 04/06/2010   Adenomatous polyps 3 in the descending colon, times one in the ascending colon [facility])  . Asthma   . COPD (chronic obstructive pulmonary disease) ([place])   . Gout   . Hemorrhoids   . Hypercholesteremia   . Hypertension   . Sleep apnea    doesn't wear CPAP  . Syncope 10/11   in settin gof asthma exacerbation w coughing. Ech (10/11): EF > 55%, mild LVH, grade I diastolic dysfunction, nomral RV size and systolic function,. normal valves. Carotid US (10/11): minimal disease    reports that he quit smoking about 24 years ago. His smoking use included cigarettes. He has a 8.75 pack-year smoking history. His smokeless tobacco use includes chew. He reports previous alcohol use. He reports that he does not use drugs.   Current Outpatient Medications:  .  allopurinol (ZYLOPRIM) 300 MG tablet, TAKE 1 TABLET BY MOUTH DAILY, Disp: 90 tablet, Rfl: 3 .  citalopram (CELEXA) 40 MG tablet, Take 1 tablet (40 mg total) by mouth daily., Disp: 90 tablet, Rfl: 1 .  FLOVENT HFA 220 MCG/ACT inhaler, INHALE 2 PUFFS INTO THE LUNGS TWICE A DAY, Disp: 12 g, Rfl: 5 .  lisinopril (ZESTRIL) 20 MG tablet, Take 1 tablet (20 mg total) by mouth daily., Disp: 90 tablet, Rfl: 1 .  montelukast (SINGULAIR) 10 MG tablet, Take 1 tablet (  10 mg total) by mouth daily., Disp: 90 tablet, Rfl: 1 .  Multiple Vitamin (MULTIVITAMIN WITH MINERALS) TABS tablet, Take 1 tablet by mouth daily., Disp: 30 tablet, Rfl: 2 .  simvastatin (ZOCOR) 20 MG tablet, Take 1 tablet (20 mg total) by mouth daily., Disp: 90 tablet, Rfl: 1 .  testosterone cypionate (DEPOTESTOSTERONE CYPIONATE) 200 MG/ML injection, INJECT 0.3 ML INTO THE MUSCLE ONCE A WEEK, Disp: 2 mL, Rfl: 0 .  Tiotropium Bromide-Olodaterol (STIOLTO  RESPIMAT) 2.5-2.5 MCG/ACT AERS, USE 2 INHALATIONS EVERY DAY, Disp: 4 g, Rfl: 5 .  VENTOLIN HFA 108 (90 Base) MCG/ACT inhaler, USE 2 PUFFS EVERY 4 HOURS AS NEEDED FOR WHEEZING OR SHORTNESS OF BREATH, Disp: 18 g, Rfl: 2   Observations/Objective: Blood pressure (!) 150/94, pulse 78, temperature 98.8 F (37.1 C), temperature source Temporal, height 5\' 11"  (1.803 m), weight 240 lb (108.9 kg), SpO2 95 %.  Physical Exam Constitutional:      Appearance: He is well-developed.  HENT:     Head: Normocephalic.     Right Ear: Hearing normal.     Left Ear: Hearing normal.     Nose: Nose normal.  Neck:     Thyroid: No thyroid mass or thyromegaly.     Vascular: No carotid bruit.     Trachea: Trachea normal.  Cardiovascular:     Rate and Rhythm: Normal rate and regular rhythm.     Pulses: Normal pulses.     Heart sounds: Heart sounds not distant. No murmur heard.  No friction rub. No gallop.      Comments: No peripheral edema Pulmonary:     Effort: Pulmonary effort is normal. No respiratory distress.     Breath sounds: Normal breath sounds.  Skin:    General: Skin is warm and dry.     Findings: No rash.  Psychiatric:        Speech: Speech normal.        Behavior: Behavior normal.        Thought Content: Thought content normal.      Assessment and Plan Essential hypertension, benign  Follow at home.. likely component of anxiety and white coat HTN.   Chronic asthma Improved control with living in lower humidity, weight loss and daily walking.   He is working on setting up a routine here.  MDD (major depressive disorder), single episode, moderate (HCC) Good control.. if SE can decreased to 1/2 tab daily.      Eliezer Lofts, MD

## 2020-08-29 NOTE — Assessment & Plan Note (Signed)
Improved control with living in lower humidity, weight loss and daily walking.   He is working on setting up a routine here.

## 2020-08-29 NOTE — Assessment & Plan Note (Signed)
Follow at home.. likely component of anxiety and white coat HTN.

## 2020-08-29 NOTE — Assessment & Plan Note (Signed)
Good control.. if SE can decreased to 1/2 tab daily.

## 2020-09-01 NOTE — Telephone Encounter (Signed)
Notified patient as instructed, patient pleased °

## 2020-09-01 NOTE — Telephone Encounter (Signed)
I received a refill request on 11/5 and sent it in

## 2021-01-25 ENCOUNTER — Telehealth: Payer: Self-pay | Admitting: Family Medicine

## 2021-01-25 DIAGNOSIS — J4551 Severe persistent asthma with (acute) exacerbation: Secondary | ICD-10-CM

## 2021-01-25 NOTE — Addendum Note (Signed)
Addended by: Carter Kitten on: 01/25/2021 02:29 PM   Modules accepted: Orders

## 2021-01-25 NOTE — Telephone Encounter (Signed)
Not on current medication list.  Please sign orders if okay to send in.

## 2021-01-25 NOTE — Telephone Encounter (Signed)
Pharmacy called [facility]) patient is asking for albuterol solution for nebulizer. Please call them back to discuss. EM

## 2021-01-31 ENCOUNTER — Other Ambulatory Visit: Payer: Self-pay | Admitting: Family Medicine

## 2021-01-31 NOTE — Telephone Encounter (Signed)
Not on current medication list.  Ok to refill?

## 2021-02-01 ENCOUNTER — Other Ambulatory Visit: Payer: Self-pay

## 2021-02-01 ENCOUNTER — Emergency Department: Payer: Medicare Other

## 2021-02-01 ENCOUNTER — Telehealth: Payer: Self-pay

## 2021-02-01 ENCOUNTER — Emergency Department
Admission: EM | Admit: 2021-02-01 | Discharge: 2021-02-01 | Disposition: A | Discharge status: Home / Self Care | Payer: Medicare Other | Attending: Emergency Medicine | Admitting: Emergency Medicine

## 2021-02-01 DIAGNOSIS — Z87891 Personal history of nicotine dependence: Secondary | ICD-10-CM | POA: Insufficient documentation

## 2021-02-01 DIAGNOSIS — Z7951 Long term (current) use of inhaled steroids: Secondary | ICD-10-CM | POA: Insufficient documentation

## 2021-02-01 DIAGNOSIS — Z79899 Other long term (current) drug therapy: Secondary | ICD-10-CM | POA: Diagnosis not present

## 2021-02-01 DIAGNOSIS — I2510 Atherosclerotic heart disease of native coronary artery without angina pectoris: Secondary | ICD-10-CM | POA: Insufficient documentation

## 2021-02-01 DIAGNOSIS — J45909 Unspecified asthma, uncomplicated: Secondary | ICD-10-CM | POA: Insufficient documentation

## 2021-02-01 DIAGNOSIS — R0981 Nasal congestion: Secondary | ICD-10-CM | POA: Insufficient documentation

## 2021-02-01 DIAGNOSIS — J441 Chronic obstructive pulmonary disease with (acute) exacerbation: Secondary | ICD-10-CM | POA: Diagnosis not present

## 2021-02-01 DIAGNOSIS — R059 Cough, unspecified: Secondary | ICD-10-CM | POA: Diagnosis not present

## 2021-02-01 DIAGNOSIS — J42 Unspecified chronic bronchitis: Secondary | ICD-10-CM | POA: Diagnosis not present

## 2021-02-01 DIAGNOSIS — I10 Essential (primary) hypertension: Secondary | ICD-10-CM | POA: Insufficient documentation

## 2021-02-01 LAB — CBC WITH DIFFERENTIAL/PLATELET
Abs Immature Granulocytes: 0.03 10*3/uL (ref 0.00–0.07)
Basophils Absolute: 0.1 10*3/uL (ref 0.0–0.1)
Basophils Relative: 1 %
Eosinophils Absolute: 0.2 10*3/uL (ref 0.0–0.5)
Eosinophils Relative: 3 %
HCT: 43.4 % (ref 39.0–52.0)
Hemoglobin: 13.9 g/dL (ref 13.0–17.0)
Immature Granulocytes: 0 %
Lymphocytes Relative: 34 %
Lymphs Abs: 2.9 10*3/uL (ref 0.7–4.0)
MCH: 23.4 pg — ABNORMAL LOW (ref 26.0–34.0)
MCHC: 32 g/dL (ref 30.0–36.0)
MCV: 73.2 fL — ABNORMAL LOW (ref 80.0–100.0)
Monocytes Absolute: 0.7 10*3/uL (ref 0.1–1.0)
Monocytes Relative: 9 %
Neutro Abs: 4.6 10*3/uL (ref 1.7–7.7)
Neutrophils Relative %: 53 %
Platelets: 323 10*3/uL (ref 150–400)
RBC: 5.93 MIL/uL — ABNORMAL HIGH (ref 4.22–5.81)
RDW: 14.7 % (ref 11.5–15.5)
WBC: 8.6 10*3/uL (ref 4.0–10.5)
nRBC: 0 % (ref 0.0–0.2)

## 2021-02-01 LAB — COMPREHENSIVE METABOLIC PANEL
ALT: 22 U/L (ref 0–44)
AST: 26 U/L (ref 15–41)
Albumin: 4.2 g/dL (ref 3.5–5.0)
Alkaline Phosphatase: 68 U/L (ref 38–126)
Anion gap: 9 (ref 5–15)
BUN: 9 mg/dL (ref 8–23)
CO2: 25 mmol/L (ref 22–32)
Calcium: 9.3 mg/dL (ref 8.9–10.3)
Chloride: 101 mmol/L (ref 98–111)
Creatinine, Ser: 0.72 mg/dL (ref 0.61–1.24)
GFR, Estimated: >60 mL/min (ref >60)
Glucose, Bld: 97 mg/dL (ref 70–99)
Potassium: 3.6 mmol/L (ref 3.5–5.1)
Sodium: 135 mmol/L (ref 135–145)
Total Bilirubin: 0.7 mg/dL (ref 0.3–1.2)
Total Protein: 7.1 g/dL (ref 6.5–8.1)

## 2021-02-01 LAB — TROPONIN I (HIGH SENSITIVITY)
Troponin I (High Sensitivity): 13 ng/L (ref <18)
Troponin I (High Sensitivity): 12 ng/L (ref <18)

## 2021-02-01 MED ORDER — IPRATROPIUM-ALBUTEROL 0.5-2.5 (3) MG/3ML IN SOLN
3.0000 mL | Freq: Once | RESPIRATORY_TRACT | Status: AC
  Administered 2021-02-01: 3 mL via RESPIRATORY_TRACT
  Filled 2021-02-01: qty 3

## 2021-02-01 MED ORDER — IPRATROPIUM BROMIDE HFA 17 MCG/ACT IN AERS: 1.0000 | INHALATION_SPRAY | Freq: Three times a day (TID) | RESPIRATORY_TRACT | 2 refills | Status: DC

## 2021-02-01 MED ORDER — PREDNISONE 50 MG PO TABS: ORAL_TABLET | ORAL | 0 refills | Status: DC

## 2021-02-01 MED ORDER — AZITHROMYCIN 250 MG PO TABS: ORAL_TABLET | ORAL | 0 refills | Status: AC

## 2021-02-01 MED ORDER — ALBUTEROL SULFATE HFA 108 (90 BASE) MCG/ACT IN AERS: 2.0000 | INHALATION_SPRAY | Freq: Four times a day (QID) | RESPIRATORY_TRACT | 2 refills | Status: DC | PRN

## 2021-02-01 NOTE — ED Provider Notes (Signed)
[facility]-EMERGENCY DEPARTMENT  ____________________________________________  Time seen: Approximately 9:38 PM  I have reviewed the triage vital signs and the nursing notes.   HISTORY  Chief Complaint Nasal Congestion and Cough   Historian Patient     HPI Cody Day is a 66 y.o. male with a history of COPD and asthma, presents to the emergency department with productive cough for 1 week.  Patient denies fever and chills.  He states that he has had some recent shortness of breath.  No sick contacts in the home with similar symptoms.  Patient reports he has never been hospitalized for a COPD exacerbation.  He reports that he typically needs prednisone and azithromycin during similar COPD exacerbations in the past.  Patient states he has been using his albuterol inhaler at home.   Past Medical History:  Diagnosis Date  . Achilles tendon rupture 10/11   and repair  . Adenomatous polyp of colon 04/06/2010   Adenomatous polyps 3 in the descending colon, times one in the ascending colon [facility])  . Asthma   . COPD (chronic obstructive pulmonary disease) ([place])   . Gout   . Hemorrhoids   . Hypercholesteremia   . Hypertension   . Sleep apnea    doesn't wear CPAP  . Syncope 10/11   in settin gof asthma exacerbation w coughing. Ech (10/11): EF > 55%, mild LVH, grade I diastolic dysfunction, nomral RV size and systolic function,. normal valves. Carotid US (10/11): minimal disease     Immunizations up to date:  Yes.     Past Medical History:  Diagnosis Date  . Achilles tendon rupture 10/11   and repair  . Adenomatous polyp of colon 04/06/2010   Adenomatous polyps 3 in the descending colon, times one in the ascending colon [facility])  . Asthma   . COPD (chronic obstructive pulmonary disease) ([place])   . Gout   . Hemorrhoids   . Hypercholesteremia   . Hypertension   . Sleep apnea    doesn't wear CPAP  . Syncope 10/11   in settin gof asthma exacerbation w  coughing. Ech (10/11): EF > 55%, mild LVH, grade I diastolic dysfunction, nomral RV size and systolic function,. normal valves. Carotid US (10/11): minimal disease    Patient Active Problem List   Diagnosis Date Noted  . Dizziness 10/01/2019  . Polysubstance abuse ([place]) 10/01/2019  . Medication management 09/20/2019  . Nocturnal hypoxia 09/20/2019  . Smokeless tobacco use 09/20/2019  . Physical deconditioning 09/20/2019  . Healthcare maintenance 09/19/2019  . Former smoker 09/19/2019  . Hypogonadism in male 06/03/2019  . Varicose veins of leg with pain, bilateral 05/04/2019  . Alcohol abuse, in remission 08/18/2018  . MDD (major depressive disorder), single episode, moderate ([place]) 08/18/2018  . Hyponatremia 07/20/2018  . Low testosterone 06/18/2018  . Gynecomastia 04/17/2018  . Enteritis due to Clostridium difficile 07/29/2016  . History of colonic polyps 06/30/2015  . Cardiomyopathy 04/25/2015  . Dyspnea 03/22/2015  . Neuropathy 07/28/2014  . Benign essential tremor 07/28/2014  . History of ventral hernia repair 05/24/2014  . Allergic rhinitis 03/21/2014  . Memory loss 03/26/2013  . Generalized anxiety disorder 12/01/2012  . OSA (obstructive sleep apnea) 12/05/2011  . Coronary atherosclerosis 12/08/2007  . PROSTATITIS, CHRONIC 08/20/2007  . NEPHROLITHIASIS, HX OF 08/20/2007  . Hyperlipidemia LDL goal <70 06/17/2007  . Prediabetes 06/17/2007  . Gout 06/09/2007  . Essential hypertension, benign 06/09/2007  . Chronic asthma 06/09/2007    Past Surgical History:  Procedure Laterality Date  .  ABDOMINAL HERNIA REPAIR  1998,2000  . calcium kidney stones    . [facility], [name], [place]; Dr. [name]. Multiple adenomatous polyps (total 4).   . COLONOSCOPY WITH PROPOFOL N/A 08/30/2015   Procedure: COLONOSCOPY WITH PROPOFOL;  Surgeon: [name], MD;  Location: [facility] ENDOSCOPY;  Service:  Endoscopy;  Laterality: N/A;  . FEMORAL HERNIA REPAIR  2007  . HEMORRHOID SURGERY    . HERNIA REPAIR  09/10/2010   Repair of recurrent ventral hernia, resection of previously placed mesh x2, implantation 7.5 x 10 cm Gore-Tex dual mesh in an underlay position, repair of epigastric hernia with a 4.2 cm Proceed ventral patch.  Marland Kitchen HERNIA REPAIR   02//28/2007   Laparoscopic right inguinal hernia repair with Surgipro mesh, Ventrilex patch at umbilicus  . HERNIA REPAIR  11/01/1990  .   Small oval Kugel patch placed in preperitoneal space, [name], MD  . HERNIA REPAIR  01/17/1997   Recurrent ventral hernia 15 x 19 Gore-Tex dual mesh placed laparoscopically with multiple lefft--sided ports, [name], MD  . HERNIA REPAIR  01/07/1995    Primary repair of ventral hernia. [name], MD  . intestinal blockage     as a child  . KIDNEY STONE extraction     unic acid stones  . left heart cath  2008   minimal luminal irregularities EF 55%  . RIGHT/LEFT HEART CATH AND CORONARY ANGIOGRAPHY N/A 01/13/2017   Procedure: Right/Left Heart Cath and Coronary Angiography;  Surgeon: [name], MD;  Location: [facility];  Service: Cardiovascular;  Laterality: N/A;  . treadmill stress test  2003    Prior to Admission medications   Medication Sig Start Date End Date Taking? Authorizing Provider  albuterol (VENTOLIN HFA) 108 (90 Base) MCG/ACT inhaler Inhale 2 puffs into the lungs every 6 (six) hours as needed for wheezing or shortness of breath. 02/01/21  Yes Vallarie Mare M, PA-C  azithromycin (ZITHROMAX Z-PAK) 250 MG tablet Take 2 tablets (500 mg) on  Day 1,  followed by 1 tablet (250 mg) once daily on Days 2 through 5. 02/01/21 02/06/21 Yes Vallarie Mare M, PA-C  ipratropium (ATROVENT HFA) 17 MCG/ACT inhaler Inhale 1 puff into the lungs 3 (three) times daily. 02/01/21 02/01/22 Yes Vallarie Mare M, PA-C  predniSONE (DELTASONE) 50 MG tablet Take once daily for five days. 02/01/21  Yes Vallarie Mare M, PA-C   allopurinol (ZYLOPRIM) 300 MG tablet TAKE 1 TABLET BY MOUTH DAILY 04/12/20   Bedsole, Amy E, MD  citalopram (CELEXA) 40 MG tablet Take 1 tablet (40 mg total) by mouth daily. 08/25/20   Bedsole, Amy E, MD  FLOVENT HFA 220 MCG/ACT inhaler INHALE 2 PUFFS INTO THE LUNGS TWICE A DAY 04/13/20   Bedsole, Amy E, MD  lisinopril (ZESTRIL) 20 MG tablet Take 1 tablet (20 mg total) by mouth daily. 08/25/20   Bedsole, Amy E, MD  montelukast (SINGULAIR) 10 MG tablet Take 1 tablet (10 mg total) by mouth daily. 08/25/20   [name], MD  Multiple Vitamin (MULTIVITAMIN WITH MINERALS) TABS tablet Take 1 tablet by mouth daily. 07/22/18   Gladstone Lighter, MD  simvastatin (ZOCOR) 20 MG tablet Take 1 tablet (20 mg total) by mouth daily. 08/25/20   Bedsole, Amy E, MD  testosterone cypionate (DEPOTESTOSTERONE CYPIONATE) 200 MG/ML injection INJECT 0.3 ML INTO THE MUSCLE ONCE A WEEK 08/25/20   Stoioff, Ronda Fairly,  MD  Tiotropium Bromide-Olodaterol (STIOLTO RESPIMAT) 2.5-2.5 MCG/ACT AERS USE 2 INHALATIONS EVERY DAY 08/25/20   Diona Browner, Amy E, MD    Allergies Levaquin [levofloxacin in d5w] and Penicillins  Family History  Problem Relation Age of Onset  . Hypertension Father   . Cataracts Father   . Diabetes Father   . Heart attack Mother 86  . Coronary artery disease Mother   . Dementia Mother   . Hypertension Sister   . Depression Sister   . Hypertension Brother   . Colon cancer Brother   . Diabetes Other        1st degree relative  . Colon cancer Maternal Aunt     Social History Social History   Tobacco Use  . Smoking status: Former Smoker    Packs/day: 0.25    Years: 35.00    Pack years: 8.75    Types: Cigarettes    Quit date: 10/22/1995    Years since quitting: 25.2  . Smokeless tobacco: Current User    Types: Chew  . Tobacco comment: rarely smoked cigarettes and cigars.  states he smoked drugs mostly  Vaping Use  . Vaping Use: Never used  Substance Use Topics  . Alcohol use: Not Currently     Alcohol/week: 0.0 standard drinks    Comment: used to use cocaine and methamphetamine  . Drug use: No    Types: Marijuana, Methamphetamines    Comment: former smokerx 27 years     Review of Systems  Constitutional: No fever/chills Eyes:  No discharge ENT: No upper respiratory complaints. Respiratory: Patient has cough. No SOB/ use of accessory muscles to breath Gastrointestinal:   No nausea, no vomiting.  No diarrhea.  No constipation. Musculoskeletal: Negative for musculoskeletal pain. Skin: Negative for rash, abrasions, lacerations, ecchymosis.    ____________________________________________   PHYSICAL EXAM:  VITAL SIGNS: ED Triage Vitals  Enc Vitals Group     BP 02/01/21 1719 (!) 162/95     Pulse Rate 02/01/21 1719 95     Resp 02/01/21 1719 20     Temp 02/01/21 1719 98.7 F (37.1 C)     Temp Source 02/01/21 1719 Oral     SpO2 02/01/21 1719 95 %     Weight 02/01/21 1732 240 lb 1.3 oz (108.9 kg)     Height 02/01/21 1720 5\' 11"  (1.803 m)     Head Circumference --      Peak Flow --      Pain Score 02/01/21 1720 0     Pain Loc --      Pain Edu? --      Excl. in [place]? --      Constitutional: Alert and oriented. Patient is lying supine. Eyes: Conjunctivae are normal. PERRL. EOMI. Head: Atraumatic. ENT:      Ears: Tympanic membranes are mildly injected with mild effusion bilaterally.       Nose: No congestion/rhinnorhea.      Mouth/Throat: Mucous membranes are moist. Posterior pharynx is mildly erythematous.  Hematological/Lymphatic/Immunilogical: No cervical lymphadenopathy.  Cardiovascular: Normal rate, regular rhythm. Normal S1 and S2.  Good peripheral circulation. Respiratory: Normal respiratory effort without tachypnea or retractions.  Patient had diffuse expiratory wheezing auscultated bilaterally.  Good air entry to the bases with no decreased or absent breath sounds. Gastrointestinal: Bowel sounds 4 quadrants. Soft and nontender to palpation. No guarding or  rigidity. No palpable masses. No distention. No CVA tenderness. Musculoskeletal: Full range of motion to all extremities. No gross deformities appreciated. Neurologic:  Normal speech  and language. No gross focal neurologic deficits are appreciated.  Skin:  Skin is warm, dry and intact. No rash noted. Psychiatric: Mood and affect are normal. Speech and behavior are normal. Patient exhibits appropriate insight and judgement.    ____________________________________________   LABS (all labs ordered are listed, but only abnormal results are displayed)  Labs Reviewed  CBC WITH DIFFERENTIAL/PLATELET - Abnormal; Notable for the following components:      Result Value   RBC 5.93 (*)    MCV 73.2 (*)    MCH 23.4 (*)    All other components within normal limits  COMPREHENSIVE METABOLIC PANEL  TROPONIN I (HIGH SENSITIVITY)  TROPONIN I (HIGH SENSITIVITY)   ____________________________________________  EKG   ____________________________________________  RADIOLOGY Unk Pinto, personally viewed and evaluated these images (plain radiographs) as part of my medical decision making, as well as reviewing the written report by the radiologist.  DG Chest 2 View  Result Date: 02/01/2021 CLINICAL DATA:  Cough EXAM: CHEST - 2 VIEW COMPARISON:  10/01/2019 FINDINGS: The heart size and mediastinal contours are within normal limits. Both lungs are clear. Disc degenerative disease of the thoracic spine. IMPRESSION: No acute abnormality of the lungs. Electronically Signed   By: Eddie Candle M.D.   On: 02/01/2021 18:32    ____________________________________________    PROCEDURES  Procedure(s) performed:     Procedures     Medications  ipratropium-albuterol (DUONEB) 0.5-2.5 (3) MG/3ML nebulizer solution 3 mL (3 mLs Nebulization Given 02/01/21 1826)  ipratropium-albuterol (DUONEB) 0.5-2.5 (3) MG/3ML nebulizer solution 3 mL (3 mLs Nebulization Given 02/01/21 1954)      ____________________________________________   INITIAL IMPRESSION / ASSESSMENT AND PLAN / ED COURSE  Pertinent labs & imaging results that were available during my care of the patient were reviewed by me and considered in my medical decision making (see chart for details).    Assessment and Plan:  COPD 65 year old male presents to the emergency department with productive cough for 1 week and wheezing.  Patient was hypertensive at triage but vital signs were otherwise reassuring.  Patient had diffuse expiratory wheezing auscultated bilaterally.  Chest x-ray shows no signs of community-acquired pneumonia.  Patient was given 2 DuoNeb's in the emergency department which improved wheezing significantly.  He was discharged with prednisone and albuterol and Atrovent as well as a Z-Pak.  Return precautions were given to return with new or worsening symptoms.     ____________________________________________  FINAL CLINICAL IMPRESSION(S) / ED DIAGNOSES  Final diagnoses:  Chronic bronchitis, unspecified chronic bronchitis type ([place])      NEW MEDICATIONS STARTED DURING THIS VISIT:  ED Discharge Orders         Ordered    azithromycin (ZITHROMAX Z-PAK) 250 MG tablet        02/01/21 2022    predniSONE (DELTASONE) 50 MG tablet        02/01/21 2022    albuterol (VENTOLIN HFA) 108 (90 Base) MCG/ACT inhaler  Every 6 hours PRN        02/01/21 2024    ipratropium (ATROVENT HFA) 17 MCG/ACT inhaler  3 times daily        02/01/21 2024              This chart was dictated using voice recognition software/Dragon. Despite best efforts to proofread, errors can occur which can change the meaning. Any change was purely unintentional.     Karren Cobble 02/01/21 2157    Vladimir Crofts, MD 02/02/21 1544

## 2021-02-01 NOTE — Telephone Encounter (Signed)
Agreed... pt needs ER visit. Albuterol nebs called in.

## 2021-02-01 NOTE — Telephone Encounter (Signed)
[facility] Day - Client TELEPHONE ADVICE RECORD [facility] Patient Name: ISON WICHMANN PE Gender: Male DOB: 11-15-1955 Age: 66 Y 2 M 20 D Return Phone Number: [phone] (Primary) Address: City/ State/ Zip: [place] [address] Client [facility] - Client Client Site [place] - Day Physician Eliezer Lofts - MD Contact Type Call Who Is Calling Patient / Member / Family / Caregiver Call Type Triage / Clinical Relationship To Patient Self Return Phone Number [phone] (Primary) Chief Complaint BREATHING - shortness of breath or sounds breathless Reason for Call Symptomatic / Request for [place] states he is having a cough, he feels like he has fluid in his lungs, and he is short of breath. He is requesting antibiotics. He is also dizzy. He is coughing fluid out every couple of minutes. Additional Comment The office transferred him to this line. They have no appts today and are closed tomorrow and she is unsure if the dioc will prescribe anything without seeing him. They wanted him triaged. [place] Not Listed ER Translation No Nurse Assessment Nurse: [place], RN, Estill Bamberg Date/Time (Eastern Time): 02/01/2021 3:36:00 PM Confirm and document reason for call. If symptomatic, describe symptoms. ---caller states he feels fluid in his lungs. audible wheezing. says doctor will call him in some antibiotics and steroids. been going on for 3 weeks. he is coughing up yellow. feels warm and has cold sweats and chills. says he needs the albuterol for his neb machine called in. the pharmacy has been requesting a refill. Does the patient have any new or worsening symptoms? ---Yes Will a triage be completed? ---Yes Related visit to physician within the last 2 weeks? ---No Does the PT have any chronic conditions? (i.e. diabetes, asthma, this includes High risk factors  for pregnancy, etc.) ---Yes List chronic conditions. ---asthma, copd Is this a behavioral health or substance abuse call? ---No PLEASE NOTE: All timestamps contained within this report are represented as [place] Standard Time. CONFIDENTIALTY NOTICE: This fax transmission is intended only for the addressee. It contains information that is legally privileged, confidential or otherwise protected from use or disclosure. If you are not the intended recipient, you are strictly prohibited from reviewing, disclosing, copying using or disseminating any of this information or taking any action in reliance on or regarding this information. If you have received this fax in error, please notify us immediately by telephone so that we can arrange for its return to [place]. Phone: [phone], Toll-Free: [phone], Fax: [phone] Page: 2 of 2 Call Id: [id number] Guidelines Guideline Title Affirmed Question Affirmed Notes Nurse Date/Time Eilene Ghazi Time) Cough - Acute Productive [1] MODERATE difficulty breathing (e.g., speaks in phrases, SOB even at rest, pulse 100-120) AND [2] still present when not coughing Hum[place]et, RN, Estill Bamberg 02/01/2021 3:38:00 PM Disp. Time Eilene Ghazi Time) Disposition Final User 02/01/2021 3:31:46 PM Send to Urgent Queue Susette Racer 02/01/2021 3:41:49 PM Go to ED Now Yes [place], RN, Shelly Coss Disagree/Comply Comply Caller Understands Yes PreDisposition InappropriateToAsk Care Advice Given Per Guideline CARE ADVICE given per Cough - Acute Productive (Adult) guideline. GO TO ED NOW: * You need to be seen in the Emergency Department. * Go to the ED at ___________ [place] now. Drive carefully. BRING MEDICINES: * Bring a list of your current medicines when you go to the Emergency Department (ER). Referrals GO TO FACILITY OTHER - SPECIFY

## 2021-02-01 NOTE — ED Notes (Signed)
See triage note  Presents with some nasal congestion and sl cough  States he has hx of COPD and asthma  Has been using his inhalers w/o much relief  Afebrile on arrival

## 2021-02-01 NOTE — ED Triage Notes (Addendum)
Pt to ER via POV from home with complaints of productive cough and nasal congestion. Clear/ yellow tinged sputum production. No fevers at home. Ambulatory to triage room. Skin warm, dry.

## 2021-02-01 NOTE — Discharge Instructions (Signed)
Take Z-Pak as directed. Take Prednisone once daily for five days.

## 2021-02-01 NOTE — Telephone Encounter (Signed)
I spoke with pt and someone is taking pt to [facility] ED now. Sending note to Dr Diona Browner.

## 2021-02-05 ENCOUNTER — Telehealth: Payer: Self-pay

## 2021-02-05 ENCOUNTER — Ambulatory Visit: Payer: Medicare Other

## 2021-02-05 NOTE — Telephone Encounter (Signed)
Noted  

## 2021-02-05 NOTE — Telephone Encounter (Signed)
Pt called wanting to review all testing done at [facility] ED on 02/01/21. Per ED note & AVS pt was to schedule FU appt with Dr Diona Browner and all test done at ED had been reviewed with pt; pt will finish Z pak and prednisone on 02/06/21; pt was told had bronchitis; pt cked home covid test on 01/29/21 which was neg. Pt said he is feeling better but when pt starts to cough he continues to cough until gets light yellow phlegm up with still some wheezing but not as bad as before taking med; pt does not sound in any distress; pt is speaking in full sentences without having to stop to take breath; pt does have SOB and dizziness on and off; pt said his pulse ox is 95 -97 %. Pt has not cked BP recently and has grandchildren with him now so difficult to ck BP and P. Pt said he does not have fever. Pt was told at [facility] ED he has bronchitis. Dr Diona Browner does not have available appt until 02/08/21 and I spoke with Butch Penny CMA who agreed ok to put pt on Dr Arley Phenix schedule for 02/08/21 and if pt condition changes or worsens prior to appt pt will go to ED. Pt will ck another covid test on 02/07/21 to verify that covid test is still neg. Sending note to Dr Diona Browner and Butch Penny CMA.

## 2021-02-06 ENCOUNTER — Other Ambulatory Visit: Payer: Self-pay | Admitting: *Deleted

## 2021-02-06 DIAGNOSIS — E291 Testicular hypofunction: Secondary | ICD-10-CM

## 2021-02-06 DIAGNOSIS — R972 Elevated prostate specific antigen [PSA]: Secondary | ICD-10-CM

## 2021-02-08 ENCOUNTER — Other Ambulatory Visit: Payer: Self-pay

## 2021-02-08 ENCOUNTER — Ambulatory Visit (INDEPENDENT_AMBULATORY_CARE_PROVIDER_SITE_OTHER): Payer: Medicare Other | Admitting: Family Medicine

## 2021-02-08 ENCOUNTER — Encounter: Payer: Self-pay | Admitting: Family Medicine

## 2021-02-08 VITALS — BP 150/90 | HR 82 | Temp 98.0°F | Ht 71.0 in | Wt 243.8 lb

## 2021-02-08 DIAGNOSIS — J4551 Severe persistent asthma with (acute) exacerbation: Secondary | ICD-10-CM

## 2021-02-08 DIAGNOSIS — I10 Essential (primary) hypertension: Secondary | ICD-10-CM

## 2021-02-08 MED ORDER — TRELEGY ELLIPTA 100-62.5-25 MCG/INH IN AEPB: 1.0000 | INHALATION_SPRAY | Freq: Every day | RESPIRATORY_TRACT | 11 refills | Status: DC

## 2021-02-08 MED ORDER — PREDNISONE 20 MG PO TABS: ORAL_TABLET | ORAL | 0 refills | Status: DC

## 2021-02-08 MED ORDER — LISINOPRIL 40 MG PO TABS: 40.0000 mg | ORAL_TABLET | Freq: Every day | ORAL | 11 refills | Status: DC

## 2021-02-08 NOTE — Progress Notes (Signed)
Patient ID: Cody Day, male    DOB: 04-30-1956, 65 y.o.   MRN: [id number]  This visit was conducted in person.  BP (!) 150/90   Pulse 82   Temp 98 F (36.7 C) (Temporal)   Ht 5\' 11"  (1.803 m)   Wt 243 lb 12 oz (110.6 kg)   SpO2 96%   BMI 34.00 kg/m    CC:  Chief Complaint  Patient presents with  . Follow-up    ER Visit-Chronic Bronchitits    Subjective:   HPI: Cody Day is a 65 y.o. male presenting on 02/08/2021 for Follow-up (ER Visit-Chronic Bronchitits)  Seen in ER on 02/01/2021 with 1 week of productive cough CXR:  No acute issue. Noted to be hypertensive. Troponin I x 1 neg, cbc and CMET unremarkable.  Given Duonebs. Treated with prednisone and azithromycin course.  Since then he reports he has not had improvement  Productive cough. Continue thick musus but solid white occ light yellow. No blood.  No fever.  Still  DOE ( not at rest, but dyspnea with minimal activity and wheezing.  Lightheadedness off and on. haeadaches  Using albuterol nebs twice daily  Using Atrovent 2 puffs twice daily.  Using Stiolto 2 puffs once daily.    Compelted pulmonary rehab.  Has not seen pulmonary since next year.   His BP continues to be elevated in office today.  At home 146/88-160/105 P 83-93... elevated even after not checking albuterol.   Taking lisinopril BP Readings from Last 3 Encounters:  02/08/21 (!) 150/90  02/01/21 (!) 162/95  08/29/20 140/82    HX of Asthma COPD     COVID 19 screen COVID testing: 01/29/2021 negative, 02/07/21 negative COVID vaccine:04/24/2020 , 04/01/2020 COVID exposure: No recent travel or known exposure to [place]  The importance of social distancing was discussed today.   Relevant past medical, surgical, family and social history reviewed and updated as indicated. Interim medical history since our last visit reviewed. Allergies and medications reviewed and updated. Outpatient Medications Prior to Visit  Medication Sig Dispense  Refill  . albuterol (VENTOLIN HFA) 108 (90 Base) MCG/ACT inhaler Inhale 2 puffs into the lungs every 6 (six) hours as needed for wheezing or shortness of breath. 8 g 2  . allopurinol (ZYLOPRIM) 300 MG tablet TAKE 1 TABLET BY MOUTH DAILY 90 tablet 3  . citalopram (CELEXA) 40 MG tablet Take 1 tablet (40 mg total) by mouth daily. 90 tablet 1  . FLOVENT HFA 220 MCG/ACT inhaler INHALE 2 PUFFS INTO THE LUNGS TWICE A DAY 12 g 5  . ipratropium (ATROVENT HFA) 17 MCG/ACT inhaler Inhale 1 puff into the lungs 3 (three) times daily. 1 each 2  . lisinopril (ZESTRIL) 20 MG tablet Take 1 tablet (20 mg total) by mouth daily. 90 tablet 1  . montelukast (SINGULAIR) 10 MG tablet Take 1 tablet (10 mg total) by mouth daily. 90 tablet 1  . Multiple Vitamin (MULTIVITAMIN WITH MINERALS) TABS tablet Take 1 tablet by mouth daily. 30 tablet 2  . predniSONE (DELTASONE) 50 MG tablet Take once daily for five days. 5 tablet 0  . simvastatin (ZOCOR) 20 MG tablet Take 1 tablet (20 mg total) by mouth daily. 90 tablet 1  . testosterone cypionate (DEPOTESTOSTERONE CYPIONATE) 200 MG/ML injection INJECT 0.3 ML INTO THE MUSCLE ONCE A WEEK 2 mL 0  . Tiotropium Bromide-Olodaterol (STIOLTO RESPIMAT) 2.5-2.5 MCG/ACT AERS USE 2 INHALATIONS EVERY DAY 4 g 5   No facility-administered medications prior to  visit.     Per HPI unless specifically indicated in ROS section below Review of Systems  Constitutional: Negative for fatigue and fever.  HENT: Negative for ear pain.   Eyes: Negative for pain.  Respiratory: Positive for cough, chest tightness, shortness of breath and wheezing.   Cardiovascular: Negative for chest pain, palpitations and leg swelling.  Gastrointestinal: Negative for abdominal pain.  Genitourinary: Negative for dysuria.  Musculoskeletal: Negative for arthralgias.  Neurological: Positive for dizziness. Negative for syncope, light-headedness and headaches.  Psychiatric/Behavioral: Negative for dysphoric mood.    Objective:  BP (!) 150/90   Pulse 82   Temp 98 F (36.7 C) (Temporal)   Ht 5\' 11"  (1.803 m)   Wt 243 lb 12 oz (110.6 kg)   SpO2 96%   BMI 34.00 kg/m   Wt Readings from Last 3 Encounters:  02/08/21 243 lb 12 oz (110.6 kg)  02/01/21 240 lb 1.3 oz (108.9 kg)  08/29/20 240 lb (108.9 kg)      Physical Exam Constitutional:      Appearance: He is well-developed.  HENT:     Head: Normocephalic.     Right Ear: Hearing normal.     Left Ear: Hearing normal.     Nose: Nose normal.  Neck:     Thyroid: No thyroid mass or thyromegaly.     Vascular: No carotid bruit.     Trachea: Trachea normal.  Cardiovascular:     Rate and Rhythm: Normal rate and regular rhythm.     Pulses: Normal pulses.     Heart sounds: Heart sounds not distant. No murmur heard. No friction rub. No gallop.      Comments: No peripheral edema Pulmonary:     Effort: Pulmonary effort is normal. No respiratory distress.     Breath sounds: Decreased air movement present. Wheezing present. No decreased breath sounds, rhonchi or rales.  Skin:    General: Skin is warm and dry.     Findings: No rash.  Neurological:     Mental Status: He is oriented to person, place, and time.     Cranial Nerves: Cranial nerves are intact.     Sensory: Sensation is intact.     Motor: Motor function is intact.     Coordination: Coordination abnormal.     Comments: tremor  Psychiatric:        Speech: Speech normal.        Behavior: Behavior normal.        Thought Content: Thought content normal.       Results for orders placed or performed during the hospital encounter of 02/01/21  CBC with Differential  Result Value Ref Range   WBC 8.6 4.0 - 10.5 K/uL   RBC 5.93 (H) 4.22 - 5.81 MIL/uL   Hemoglobin 13.9 13.0 - 17.0 g/dL   HCT 43.4 39.0 - 52.0 %   MCV 73.2 (L) 80.0 - 100.0 fL   MCH 23.4 (L) 26.0 - 34.0 pg   MCHC 32.0 30.0 - 36.0 g/dL   RDW 14.7 11.5 - 15.5 %   Platelets 323 150 - 400 K/uL   nRBC 0.0 0.0 - 0.2 %    Neutrophils Relative % 53 %   Neutro Abs 4.6 1.7 - 7.7 K/uL   Lymphocytes Relative 34 %   Lymphs Abs 2.9 0.7 - 4.0 K/uL   Monocytes Relative 9 %   Monocytes Absolute 0.7 0.1 - 1.0 K/uL   Eosinophils Relative 3 %   Eosinophils Absolute 0.2 0.0 -  0.5 K/uL   Basophils Relative 1 %   Basophils Absolute 0.1 0.0 - 0.1 K/uL   Immature Granulocytes 0 %   Abs Immature Granulocytes 0.03 0.00 - 0.07 K/uL  Comprehensive metabolic panel  Result Value Ref Range   Sodium 135 135 - 145 mmol/L   Potassium 3.6 3.5 - 5.1 mmol/L   Chloride 101 98 - 111 mmol/L   CO2 25 22 - 32 mmol/L   Glucose, Bld 97 70 - 99 mg/dL   BUN 9 8 - 23 mg/dL   Creatinine, Ser 0.72 0.61 - 1.24 mg/dL   Calcium 9.3 8.9 - 10.3 mg/dL   Total Protein 7.1 6.5 - 8.1 g/dL   Albumin 4.2 3.5 - 5.0 g/dL   AST 26 15 - 41 U/L   ALT 22 0 - 44 U/L   Alkaline Phosphatase 68 38 - 126 U/L   Total Bilirubin 0.7 0.3 - 1.2 mg/dL   GFR, Estimated >60 >60 mL/min   Anion gap 9 5 - 15  Troponin I (High Sensitivity)  Result Value Ref Range   Troponin I (High Sensitivity) 13 <18 ng/L  Troponin I (High Sensitivity)  Result Value Ref Range   Troponin I (High Sensitivity) 12 <18 ng/L    This visit occurred during the SARS-CoV-2 public health emergency.  Safety protocols were in place, including screening questions prior to the visit, additional usage of staff PPE, and extensive cleaning of exam room while observing appropriate contact time as indicated for disinfecting solutions.   COVID 19 screen:  No recent travel or known exposure to COVID19 The patient denies respiratory symptoms of COVID 19 at this time. The importance of social distancing was discussed today.   Assessment and Plan Problem List Items Addressed This Visit    Chronic asthma    No clear current viral or bacterial infection.. but continued COPD/asthma exacerbation.. not yet at baseline.  Repeat prednisone taper.  Will try trial of change from stiolto and flovent to trelegy  if affordable.  Continue allergy meds.  Restart pulmonary rehab exercise at home.      Relevant Medications   Fluticasone-Umeclidin-Vilant (TRELEGY ELLIPTA) 100-62.5-25 MCG/INH AEPB   predniSONE (DELTASONE) 20 MG tablet   Essential hypertension, benign - Primary    Inadequate control on lisinopril 20 mg daily.. increase to 40 mg daily.. BP and BMET in 2 weeks       Relevant Medications   lisinopril (ZESTRIL) 40 MG tablet         Eliezer Lofts, MD

## 2021-02-08 NOTE — Patient Instructions (Addendum)
Increase lisinopril to 40 mg daily. Follow BP daily .. goal < 140/90... bring in measurement. Repeat a course of prednisone. Change Flovent and Stiolto to Trelegy if affordable.

## 2021-02-08 NOTE — Assessment & Plan Note (Signed)
No clear current viral or bacterial infection.. but continued COPD/asthma exacerbation.. not yet at baseline.  Repeat prednisone taper.  Will try trial of change from stiolto and flovent to trelegy if affordable.  Continue allergy meds.  Restart pulmonary rehab exercise at home.

## 2021-02-08 NOTE — Assessment & Plan Note (Signed)
Inadequate control on lisinopril 20 mg daily.. increase to 40 mg daily.. BP and BMET in 2 weeks

## 2021-02-16 ENCOUNTER — Telehealth: Payer: Self-pay | Admitting: Family Medicine

## 2021-02-16 DIAGNOSIS — R7303 Prediabetes: Secondary | ICD-10-CM

## 2021-02-16 DIAGNOSIS — E785 Hyperlipidemia, unspecified: Secondary | ICD-10-CM

## 2021-02-16 NOTE — Telephone Encounter (Signed)
-----   Message from Ellamae Sia sent at 02/06/2021 11:24 AM EDT ----- Regarding: Lab orders for Monday, 5.2.22 Patient is scheduled for CPX labs, please order future labs, Thanks , Karna Christmas

## 2021-02-19 ENCOUNTER — Other Ambulatory Visit (INDEPENDENT_AMBULATORY_CARE_PROVIDER_SITE_OTHER): Payer: Medicare Other

## 2021-02-19 ENCOUNTER — Other Ambulatory Visit: Payer: Self-pay

## 2021-02-19 DIAGNOSIS — R7303 Prediabetes: Secondary | ICD-10-CM

## 2021-02-19 DIAGNOSIS — E785 Hyperlipidemia, unspecified: Secondary | ICD-10-CM | POA: Diagnosis not present

## 2021-02-19 LAB — COMPREHENSIVE METABOLIC PANEL
ALT: 25 U/L (ref 0–53)
AST: 20 U/L (ref 0–37)
Albumin: 4.2 g/dL (ref 3.5–5.2)
Alkaline Phosphatase: 63 U/L (ref 39–117)
BUN: 9 mg/dL (ref 6–23)
CO2: 31 mEq/L (ref 19–32)
Calcium: 9.1 mg/dL (ref 8.4–10.5)
Chloride: 100 mEq/L (ref 96–112)
Creatinine, Ser: 0.93 mg/dL (ref 0.40–1.50)
GFR: 86.92 mL/min (ref >60.00)
Glucose, Bld: 103 mg/dL — ABNORMAL HIGH (ref 70–99)
Potassium: 4 mEq/L (ref 3.5–5.1)
Sodium: 138 mEq/L (ref 135–145)
Total Bilirubin: 1.2 mg/dL (ref 0.2–1.2)
Total Protein: 6.5 g/dL (ref 6.0–8.3)

## 2021-02-19 LAB — LIPID PANEL
Cholesterol: 150 mg/dL (ref 0–200)
HDL: 57.6 mg/dL (ref >39.00)
LDL Cholesterol: 70 mg/dL (ref 0–99)
NonHDL: 91.97
Total CHOL/HDL Ratio: 3
Triglycerides: 111 mg/dL (ref 0.0–149.0)
VLDL: 22.2 mg/dL (ref 0.0–40.0)

## 2021-02-19 LAB — HEMOGLOBIN A1C: Hgb A1c MFr Bld: 7 % — ABNORMAL HIGH (ref 4.6–6.5)

## 2021-02-19 NOTE — Progress Notes (Signed)
No critical labs need to be addressed urgently. We will discuss labs in detail at upcoming office visit.   

## 2021-02-21 ENCOUNTER — Other Ambulatory Visit: Payer: Medicare Other

## 2021-02-21 ENCOUNTER — Other Ambulatory Visit: Payer: Self-pay

## 2021-02-21 DIAGNOSIS — E291 Testicular hypofunction: Secondary | ICD-10-CM

## 2021-02-21 DIAGNOSIS — R972 Elevated prostate specific antigen [PSA]: Secondary | ICD-10-CM | POA: Diagnosis not present

## 2021-02-22 ENCOUNTER — Encounter: Payer: Self-pay | Admitting: Urology

## 2021-02-22 ENCOUNTER — Ambulatory Visit (INDEPENDENT_AMBULATORY_CARE_PROVIDER_SITE_OTHER): Payer: Medicare Other | Admitting: Urology

## 2021-02-22 VITALS — BP 161/108 | HR 90 | Ht 72.0 in | Wt 243.0 lb

## 2021-02-22 DIAGNOSIS — E291 Testicular hypofunction: Secondary | ICD-10-CM | POA: Diagnosis not present

## 2021-02-22 DIAGNOSIS — R972 Elevated prostate specific antigen [PSA]: Secondary | ICD-10-CM

## 2021-02-22 LAB — PSA: Prostate Specific Ag, Serum: 0.7 ng/mL (ref 0.0–4.0)

## 2021-02-22 LAB — TESTOSTERONE: Testosterone: 647 ng/dL (ref 264–916)

## 2021-02-22 LAB — HEMATOCRIT: Hematocrit: 46.7 % (ref 37.5–51.0)

## 2021-02-22 MED ORDER — TESTOSTERONE CYPIONATE 200 MG/ML IM SOLN: INTRAMUSCULAR | 0 refills | Status: DC

## 2021-02-22 NOTE — Progress Notes (Signed)
02/22/2021 4:37 PM   Cody Day [birth year] [id number]  Referring provider: [name], MD [address] [place],  [place] [address]  Chief Complaint  Patient presents with  . Hypogonadism    Urologic history: 1.  Hypogonadism -Testosterone cypionate 0.3 cc weekly -MR fusion biopsy 07/2019 for abnormal PSA velocity (PI-RADS 3 lesion), benign pathology   HPI: 66 y.o. male presents for annual follow-up.   No problems since last years visit  Decreased energy level and libido  Denies bothersome LUTS  Denies breast tenderness/enlargement  47-month interim labs testosterone level 100 ng/dL; hematocrit 46.9  Labs 02/21/2021: Testosterone 647 ng/dL; PSA 0.7; hematocrit 46.7  Recently with some respiratory issues and felt something pull in the left groin region after coughing spell earlier this week   PMH: Past Medical History:  Diagnosis Date  . Achilles tendon rupture 10/11   and repair  . Adenomatous polyp of colon 04/06/2010   Adenomatous polyps 3 in the descending colon, times one in the ascending colon [facility])  . Asthma   . COPD (chronic obstructive pulmonary disease) ([place])   . Gout   . Hemorrhoids   . Hypercholesteremia   . Hypertension   . Sleep apnea    doesn't wear CPAP  . Syncope 10/11   in settin gof asthma exacerbation w coughing. Ech (10/11): EF > 55%, mild LVH, grade I diastolic dysfunction, nomral RV size and systolic function,. normal valves. Carotid US (10/11): minimal disease    Surgical History: Past Surgical History:  Procedure Laterality Date  . ABDOMINAL HERNIA REPAIR  1998,2000  . calcium kidney stones    . [facility], [name], [place]; Dr. [name]. Multiple adenomatous polyps (total 4).   . COLONOSCOPY WITH PROPOFOL N/A 08/30/2015   Procedure: COLONOSCOPY WITH PROPOFOL;  Surgeon: [name], MD;  Location: [facility] ENDOSCOPY;   Service: Endoscopy;  Laterality: N/A;  . FEMORAL HERNIA REPAIR  2007  . HEMORRHOID SURGERY    . HERNIA REPAIR  09/10/2010   Repair of recurrent ventral hernia, resection of previously placed mesh x2, implantation 7.5 x 10 cm Gore-Tex dual mesh in an underlay position, repair of epigastric hernia with a 4.2 cm Proceed ventral patch.  Marland Kitchen HERNIA REPAIR   02//28/2007   Laparoscopic right inguinal hernia repair with Surgipro mesh, Ventrilex patch at umbilicus  . HERNIA REPAIR  11/01/1990  .   Small oval Kugel patch placed in preperitoneal space, [name], MD  . HERNIA REPAIR  01/17/1997   Recurrent ventral hernia 15 x 19 Gore-Tex dual mesh placed laparoscopically with multiple lefft--sided ports, [name], MD  . HERNIA REPAIR  01/07/1995    Primary repair of ventral hernia. [name], MD  . intestinal blockage     as a child  . KIDNEY STONE extraction     unic acid stones  . left heart cath  2008   minimal luminal irregularities EF 55%  . RIGHT/LEFT HEART CATH AND CORONARY ANGIOGRAPHY N/A 01/13/2017   Procedure: Right/Left Heart Cath and Coronary Angiography;  Surgeon: [name], MD;  Location: [facility];  Service: Cardiovascular;  Laterality: N/A;  . treadmill stress test  2003    Home Medications:  Allergies as of 02/22/2021      Reactions   Levaquin [levofloxacin In D5w]    Severe headaches, skin tingling/redness   Penicillins Hives, Rash   Has  patient had a PCN reaction causing immediate rash, facial/tongue/throat swelling, SOB or lightheadedness with hypotension: Yes Has patient had a PCN reaction causing severe rash involving mucus membranes or skin necrosis: Unknown Has patient had a PCN reaction that required hospitalization: No Has patient had a PCN reaction occurring within the last 10 years: No - Childhood reaction If all of the above answers are "NO", then may proceed with Cephalosporin use.      Medication List       Accurate as of Feb 22, 2021  4:37 PM.  If you have any questions, ask your nurse or doctor.        albuterol 108 (90 Base) MCG/ACT inhaler Commonly known as: VENTOLIN HFA Inhale 2 puffs into the lungs every 6 (six) hours as needed for wheezing or shortness of breath.   allopurinol 300 MG tablet Commonly known as: ZYLOPRIM TAKE 1 TABLET BY MOUTH DAILY   citalopram 40 MG tablet Commonly known as: CELEXA Take 1 tablet (40 mg total) by mouth daily.   lisinopril 40 MG tablet Commonly known as: ZESTRIL Take 1 tablet (40 mg total) by mouth daily.   montelukast 10 MG tablet Commonly known as: SINGULAIR Take 1 tablet (10 mg total) by mouth daily.   multivitamin with minerals Tabs tablet Take 1 tablet by mouth daily.   predniSONE 20 MG tablet Commonly known as: DELTASONE 3 tabs by mouth daily x 3 days, then 2 tabs by mouth daily x 2 days then 1 tab by mouth daily x 2 days   simvastatin 20 MG tablet Commonly known as: ZOCOR Take 1 tablet (20 mg total) by mouth daily.   testosterone cypionate 200 MG/ML injection Commonly known as: DEPOTESTOSTERONE CYPIONATE INJECT 0.3 ML INTO THE MUSCLE ONCE A WEEK   Trelegy Ellipta 100-62.5-25 MCG/INH Aepb Generic drug: Fluticasone-Umeclidin-Vilant Inhale 1 puff into the lungs daily.       Allergies:  Allergies  Allergen Reactions  . Levaquin [Levofloxacin In D5w]     Severe headaches, skin tingling/redness  . Penicillins Hives and Rash    Has patient had a PCN reaction causing immediate rash, facial/tongue/throat swelling, SOB or lightheadedness with hypotension: Yes Has patient had a PCN reaction causing severe rash involving mucus membranes or skin necrosis: Unknown Has patient had a PCN reaction that required hospitalization: No Has patient had a PCN reaction occurring within the last 10 years: No - Childhood reaction If all of the above answers are "NO", then may proceed with Cephalosporin use.     Family History: Family History  Problem Relation Age of Onset  .  Hypertension Father   . Cataracts Father   . Diabetes Father   . Heart attack Mother 46  . Coronary artery disease Mother   . Dementia Mother   . Hypertension Sister   . Depression Sister   . Hypertension Brother   . Colon cancer Brother   . Diabetes Other        1st degree relative  . Colon cancer Maternal Aunt     Social History:  reports that he quit smoking about 25 years ago. His smoking use included cigarettes. He has a 8.75 pack-year smoking history. His smokeless tobacco use includes chew. He reports previous alcohol use. He reports that he does not use drugs.   Physical Exam: BP (!) 161/108   Pulse 90   Ht 6' (1.829 m)   Wt 243 lb (110.2 kg)   BMI 32.96 kg/m   Constitutional:  Alert and oriented,  No acute distress. HEENT: [place] AT, moist mucus membranes.  Trachea midline, no masses. Cardiovascular: No clubbing, cyanosis, or edema. Respiratory: Normal respiratory effort, no increased work of breathing. GI: Abdomen is soft, nontender, nondistended, no abdominal masses GU: Phallus without lesions, testes descended bilaterally.  Left testis descended and atrophic.  No palpable hernia.  Prostate 40 g, smooth without nodules Lymph: No cervical or inguinal lymphadenopathy. Skin: No rashes, bruises or suspicious lesions. Neurologic: Grossly intact, no focal deficits, moving all 4 extremities. Psychiatric: Normal mood and affect.    Assessment & Plan:    1.  Hypogonadism  Requested increasing his dose however his midcycle testosterone level was 647 and would not recommend dose change based on level and AUA guidelines  Lab visit 6 months for testosterone/hematocrit  1 year follow-up office visit testosterone, PSA, hematocrit and DRE  Testosterone was refilled   Abbie Sons, [place] [address] [place], [place] [address] [phone]

## 2021-02-23 ENCOUNTER — Ambulatory Visit: Payer: Self-pay | Admitting: Urology

## 2021-02-26 ENCOUNTER — Other Ambulatory Visit: Payer: Self-pay | Admitting: Family Medicine

## 2021-02-26 ENCOUNTER — Telehealth: Payer: Self-pay | Admitting: *Deleted

## 2021-02-26 LAB — HM DIABETES FOOT EXAM

## 2021-02-26 NOTE — Telephone Encounter (Signed)
-----   Message from Abbie Sons, MD sent at 02/22/2021  4:43 PM EDT ----- Regarding: Testosterone Testosterone refill sent to pharmacy.  I reviewed his testosterone level and based on level and guidelines for testosterone replacement would not recommend increasing the dose.

## 2021-02-26 NOTE — Telephone Encounter (Signed)
Notified patient as instructed, patient pleased. Discussed follow-up appointments, patient agrees  

## 2021-02-27 ENCOUNTER — Ambulatory Visit: Payer: Medicare Other | Admitting: Family Medicine

## 2021-02-27 ENCOUNTER — Ambulatory Visit (INDEPENDENT_AMBULATORY_CARE_PROVIDER_SITE_OTHER): Payer: Medicare Other | Admitting: Family Medicine

## 2021-02-27 ENCOUNTER — Encounter: Payer: Self-pay | Admitting: Family Medicine

## 2021-02-27 ENCOUNTER — Other Ambulatory Visit: Payer: Self-pay

## 2021-02-27 VITALS — BP 148/86 | HR 111 | Temp 98.3°F | Ht 71.25 in | Wt 241.8 lb

## 2021-02-27 DIAGNOSIS — E785 Hyperlipidemia, unspecified: Secondary | ICD-10-CM | POA: Diagnosis not present

## 2021-02-27 DIAGNOSIS — F1911 Other psychoactive substance abuse, in remission: Secondary | ICD-10-CM

## 2021-02-27 DIAGNOSIS — E1169 Type 2 diabetes mellitus with other specified complication: Secondary | ICD-10-CM | POA: Diagnosis not present

## 2021-02-27 DIAGNOSIS — J4550 Severe persistent asthma, uncomplicated: Secondary | ICD-10-CM | POA: Diagnosis not present

## 2021-02-27 DIAGNOSIS — E291 Testicular hypofunction: Secondary | ICD-10-CM | POA: Diagnosis not present

## 2021-02-27 DIAGNOSIS — G4733 Obstructive sleep apnea (adult) (pediatric): Secondary | ICD-10-CM

## 2021-02-27 DIAGNOSIS — F321 Major depressive disorder, single episode, moderate: Secondary | ICD-10-CM

## 2021-02-27 DIAGNOSIS — E1159 Type 2 diabetes mellitus with other circulatory complications: Secondary | ICD-10-CM | POA: Diagnosis not present

## 2021-02-27 DIAGNOSIS — M1A9XX0 Chronic gout, unspecified, without tophus (tophi): Secondary | ICD-10-CM

## 2021-02-27 DIAGNOSIS — G250 Essential tremor: Secondary | ICD-10-CM

## 2021-02-27 DIAGNOSIS — Z0001 Encounter for general adult medical examination with abnormal findings: Secondary | ICD-10-CM | POA: Diagnosis not present

## 2021-02-27 DIAGNOSIS — Z0000 Encounter for general adult medical examination without abnormal findings: Secondary | ICD-10-CM

## 2021-02-27 DIAGNOSIS — I152 Hypertension secondary to endocrine disorders: Secondary | ICD-10-CM

## 2021-02-27 NOTE — Assessment & Plan Note (Signed)
Followed by urology.   

## 2021-02-27 NOTE — Progress Notes (Signed)
Patient ID: Cody Day, male    DOB: [birth year], 65 y.o.   MRN: [id number]  This visit was conducted in person.  BP (!) 148/86   Pulse (!) 111   Temp 98.3 F (36.8 C) (Temporal)   Ht 5' 11.25" (1.81 m)   Wt 241 lb 12.8 oz (109.7 kg)   SpO2 96%   BMI 33.49 kg/m    CC:  Chief Complaint  Patient presents with  . Medicare Wellness    Subjective:   HPI: Cody Day is a 65 y.o. male presenting on 02/27/2021 for Medicare Wellness  The patient presents for annual medicare wellness, complete physical and review of chronic health problems. He/She also has the following acute concerns today: none  I have personally reviewed the Medicare Annual Wellness questionnaire and have noted 1. The patient's medical and social history 2. Their use of alcohol, tobacco or illicit drugs 3. Their current medications and supplements 4. The patient's functional ability including ADL's, fall risks, home safety risks and hearing or visual             impairment. 5. Diet and physical activities 6. Evidence for depression or mood disorders 7.         Updated provider list Cognitive evaluation was performed and recorded on pt medicare questionnaire form. The patients weight, height, BMI and visual acuity have been recorded in the chart  I have made referrals, counseling and provided education to the patient based review of the above and I have provided the pt with a written personalized care plan for preventive services.   Documentation of this information was scanned into the electronic record under the media tab.  Advance directives and end of life planning reviewed in detail with patient and documented in EMR. Patient given handout on advance care directives if needed. HCPOA and living will updated if needed.  Fall Risk  02/03/2020 10/04/2019 02/18/2019 02/18/2019 11/05/2016  Falls in the past year? 0 0 1 0 No  Number falls in past yr: 0 0 0 - -  Injury with Fall? 0 0 0 - -  Risk for fall due to  : Medication side effect - - - -  Follow up Falls evaluation completed;Falls prevention discussed Falls evaluation completed;Education provided;Falls prevention discussed - - -    Flowsheet Row [facility]  PHQ-2 Total Score 0     Hypertension:  Improved control on lisinopril 40 mg daily.. not yet at goal. BP Readings from Last 3 Encounters:  02/27/21 (!) 148/86  02/22/21 (!) 161/108  02/08/21 (!) 150/90  Using medication without problems or lightheadedness: occ Chest pain with exertion: none Edema: none Short of breath: yes Average home BPs: 140-150/90-99 when up and moing at goal when resting.Other issues:  Elevated Cholesterol: HX of cardiomyopathy and CAD.Marland Kitchen. Goal LDL < 70  on simvastatin 20 mg daily Lab Results  Component Value Date   CHOL 150 02/19/2021   HDL 57.60 02/19/2021   LDLCALC 70 02/19/2021   LDLDIRECT 53.0 08/23/2016   TRIG 111.0 02/19/2021   CHOLHDL 3 02/19/2021  Using medications without problems: Muscle aches:  Diet compliance: Exercise: walking Other complaints:  MDD, GAD: Stable control on citalopram. Sleeping at night... but only 6 hours of sleep.  Wt Readings from Last 3 Encounters:  02/27/21 241 lb 12.8 oz (109.7 kg)  02/22/21 243 lb (110.2 kg)  02/08/21 243 lb 12 oz (110.6 kg)    Gout, no  flares.. stable on allopurinol. Rare flare.  Diabetes:  Has been  Eating more  Cookies lately. Lab Results  Component Value Date   HGBA1C 7.0 (H) 02/19/2021    Lab Results  Component Value Date   LABURIC 4.9 02/15/2019     Hypogonadism and elevated PSa, hx of chronic prostatitis followed by urology.   Plans trial of trelegy for chronic bronchitis  Patient Care Team: [name], MD as PCP - General Byrnett, Forest Gleason, MD (General Surgery) Pulmonary: Dr. Mortimer Fries Urology Dr. Bernardo Heater  Cardiology: Dr. Garen Lah      Relevant past medical, surgical, family and social history reviewed and  updated as indicated. Interim medical history since our last visit reviewed. Allergies and medications reviewed and updated. Outpatient Medications Prior to Visit  Medication Sig Dispense Refill  . albuterol (VENTOLIN HFA) 108 (90 Base) MCG/ACT inhaler Inhale 2 puffs into the lungs every 6 (six) hours as needed for wheezing or shortness of breath. 8 g 2  . allopurinol (ZYLOPRIM) 300 MG tablet TAKE 1 TABLET BY MOUTH DAILY 90 tablet 3  . citalopram (CELEXA) 40 MG tablet TAKE 1 TABLET BY MOUTH DAILY 90 tablet 1  . Fluticasone-Umeclidin-Vilant (TRELEGY ELLIPTA) 100-62.5-25 MCG/INH AEPB Inhale 1 puff into the lungs daily. 1 each 11  . lisinopril (ZESTRIL) 40 MG tablet Take 1 tablet (40 mg total) by mouth daily. 30 tablet 11  . montelukast (SINGULAIR) 10 MG tablet Take 1 tablet (10 mg total) by mouth daily. 90 tablet 1  . Multiple Vitamin (MULTIVITAMIN WITH MINERALS) TABS tablet Take 1 tablet by mouth daily. 30 tablet 2  . predniSONE (DELTASONE) 20 MG tablet 3 tabs by mouth daily x 3 days, then 2 tabs by mouth daily x 2 days then 1 tab by mouth daily x 2 days 15 tablet 0  . simvastatin (ZOCOR) 20 MG tablet Take 1 tablet (20 mg total) by mouth daily. 90 tablet 1  . testosterone cypionate (DEPOTESTOSTERONE CYPIONATE) 200 MG/ML injection INJECT 0.3 ML INTO THE MUSCLE ONCE A WEEK 5 mL 0   No facility-administered medications prior to visit.     Per HPI unless specifically indicated in ROS section below Review of Systems  Constitutional: Positive for fatigue. Negative for fever.  HENT: Negative for ear pain.   Eyes: Negative for pain.  Respiratory: Positive for shortness of breath and wheezing. Negative for cough.   Cardiovascular: Negative for chest pain, palpitations and leg swelling.  Gastrointestinal: Negative for abdominal pain.  Genitourinary: Negative for dysuria.  Musculoskeletal: Negative for arthralgias.  Neurological: Negative for syncope, light-headedness and headaches.   Psychiatric/Behavioral: Negative for dysphoric mood.   Objective:  BP (!) 148/86   Pulse (!) 111   Temp 98.3 F (36.8 C) (Temporal)   Ht 5' 11.25" (1.81 m)   Wt 241 lb 12.8 oz (109.7 kg)   SpO2 96%   BMI 33.49 kg/m   Wt Readings from Last 3 Encounters:  02/27/21 241 lb 12.8 oz (109.7 kg)  02/22/21 243 lb (110.2 kg)  02/08/21 243 lb 12 oz (110.6 kg)      Physical Exam Constitutional:      Appearance: He is well-developed.  HENT:     Head: Normocephalic.     Right Ear: Hearing normal.     Left Ear: Hearing normal.     Nose: Nose normal.  Neck:     Thyroid: No thyroid mass or thyromegaly.     Vascular: No carotid bruit.     Trachea: Trachea normal.  Cardiovascular:     Rate and Rhythm: Normal rate and regular rhythm.     Pulses: Normal pulses.     Heart sounds: Heart sounds not distant. No murmur heard. No friction rub. No gallop.      Comments: No peripheral edema Pulmonary:     Effort: Pulmonary effort is normal. No respiratory distress.     Breath sounds: Decreased breath sounds, wheezing and rhonchi present.  Skin:    General: Skin is warm and dry.     Findings: No rash.  Psychiatric:        Speech: Speech normal.        Behavior: Behavior normal.        Thought Content: Thought content normal.       Diabetic foot exam: Normal inspection No skin breakdown No calluses  Normal DP pulses Normal sensation to light touch and monofilament Nails normal  Results for orders placed or performed in visit on 02/21/21  Testosterone  Result Value Ref Range   Testosterone 647 264 - 916 ng/dL  Hematocrit  Result Value Ref Range   Hematocrit 46.7 37.5 - 51.0 %  PSA  Result Value Ref Range   Prostate Specific Ag, Serum 0.7 0.0 - 4.0 ng/mL    This visit occurred during the SARS-CoV-2 public health emergency.  Safety protocols were in place, including screening questions prior to the visit, additional usage of staff PPE, and extensive cleaning of exam room while  observing appropriate contact time as indicated for disinfecting solutions.   COVID 19 screen:  No recent travel or known exposure to COVID19 The patient denies respiratory symptoms of COVID 19 at this time. The importance of social distancing was discussed today.   Assessment and Plan The patient's preventative maintenance and recommended screening tests for an annual wellness exam were reviewed in full today. Brought up to date unless services declined.  Counselled on the importance of diet, exercise, and its role in overall health and mortality. The patient's FH and SH was reviewed, including their home life, tobacco status, and drug and alcohol status.   Vaccines:uptodate, Tdap due,  Discussed COVID19 vaccine side effects and benefits. Strongly encouraged the patient to get the vaccine. Questions answered. Prostate Cancer Screen:stable PSA 10/25/2018  Lab Results  Component Value Date   PSA1 0.7 02/21/2021   PSA1 0.3 02/18/2020   PSA1 1.8 04/29/2019   PSA 0.49 07/14/2015   PSA 0.48 12/20/2013   PSA 0.34 11/24/2012   Colon Cancer Screen: 08/2015 colonoscopy.. repeat in 10 years      Smoking Status:nonsmoker.. dips ETOH/ drug MVH:QION Hep C: neg HIV screen: neg Colon: 2016 repeat in 10 years   Problem List Items Addressed This Visit    Benign essential tremor    Has not tried BBlocker. Consider as BP med if additional med needed.. would be careful to make sure it did not exacerbate breathing issues.      Chronic asthma    Inadequate control.. planning trial of Trelegy.      Controlled diabetes mellitus with circulatory complication  ( HTN) ([place])    New diagnosis.  Reviewed diet and lifestyle. Info provided.  Encouraged daily foot exam and yearly eye exam. Re-eval in 3 months.      Gout    Stable, chronic.  Continue current medication.    allopurinol 100 mg daily      History of substance abuse ([place])    No current issues. Per pt  Contributed to current  breathing issues.  Hyperlipidemia associated with type 2 diabetes mellitus ([place])    Aortic atherosclerosis/CAD.Marland Kitchen LDL goal < 70   Stable, chronic.  Continue current medication.  Simvastatin 20 mg daily         Hypertension associated with diabetes ([place]) - Primary    Improved control on lisinopril 40 mg daily. He states control is worse when up and moving around given SOB. May need additional med.. re-eval in 3 months after low salt diet and weight loss.  Return to pulmonary rehab if able.      Hypogonadism in male    Followed by urology.      MDD (major depressive disorder), single episode, moderate (H[facility])    Stable, chronic.  Continue current medication.   Citalopram 40 mg daily.      OSA (obstructive sleep apnea)    Encouraged CPAP use.         Eliezer Lofts, MD

## 2021-02-27 NOTE — Assessment & Plan Note (Signed)
Encouraged CPAP use.   

## 2021-02-27 NOTE — Assessment & Plan Note (Signed)
Stable, chronic.  Continue current medication.    allopurinol 100 mg daily

## 2021-02-27 NOTE — Assessment & Plan Note (Addendum)
Has not tried Nurse, mental health. Consider as BP med if additional med needed.. would be careful to make sure it did not exacerbate breathing issues.

## 2021-02-27 NOTE — Assessment & Plan Note (Signed)
New diagnosis.  Reviewed diet and lifestyle. Info provided.  Encouraged daily foot exam and yearly eye exam. Re-eval in 3 months.

## 2021-02-27 NOTE — Assessment & Plan Note (Signed)
Stable, chronic.  Continue current medication.   Citalopram 40 mg daily. 

## 2021-02-27 NOTE — Assessment & Plan Note (Signed)
Inadequate control.. planning trial of Trelegy.

## 2021-02-27 NOTE — Assessment & Plan Note (Signed)
No current issues. Per pt  Contributed to current breathing issues.

## 2021-02-27 NOTE — Assessment & Plan Note (Addendum)
Improved control on lisinopril 40 mg daily. He states control is worse when up and moving around given SOB. May need additional med.. re-eval in 3 months after low salt diet and weight loss.  Return to pulmonary rehab if able.

## 2021-02-27 NOTE — Assessment & Plan Note (Signed)
Aortic atherosclerosis/CAD.Marland Kitchen LDL goal < 70   Stable, chronic.  Continue current medication.  Simvastatin 20 mg daily

## 2021-02-27 NOTE — Patient Instructions (Addendum)
Get back to regular exercise, weight loss and low carb diet. Review low carb diet... decrease cookies.  Stop dip as able. Start Trelegy for breathing control trial. Call Dr. Bary Castilla about the left groin pain and possible hernia. Set up yearly eye exam for diabetes and have the opthalmologist send [place] a copy of the evaluation for the chart. Call for skin check with Dr. Nehemiah Massed Dermatolgy.

## 2021-03-02 ENCOUNTER — Telehealth: Payer: Self-pay | Admitting: Family Medicine

## 2021-03-02 DIAGNOSIS — E291 Testicular hypofunction: Secondary | ICD-10-CM

## 2021-03-02 NOTE — Telephone Encounter (Signed)
Patient called and states we need to call the pharmacy for a clarification on getting the testosterone filled. I spoke to the pharmacist at [facility]. He filled a 63ml vial on 09/01/2020 and at 0.13ml it should last 233 days. He is wanting to fill the new RX and the pharmacy needs permission to fill the medication early as it has only been 162 days. He should still have enough to last him 2 more months. Please advise on the early refill?

## 2021-03-04 MED ORDER — TESTOSTERONE CYPIONATE 200 MG/ML IM SOLN: INTRAMUSCULAR | 0 refills | Status: DC

## 2021-03-04 NOTE — Telephone Encounter (Signed)
Please verify dose he is taking.  I sent in an Rx for a 2 cc vial

## 2021-03-05 ENCOUNTER — Ambulatory Visit: Payer: Medicare Other | Admitting: Urology

## 2021-03-05 NOTE — Telephone Encounter (Signed)
Patient is taking 0.3cc every week. He statyes there is always some left in the needle and that is why he runs out early.

## 2021-03-21 NOTE — Telephone Encounter (Signed)
Patient called the office again today.  He has not been able to pick up the the new prescription that Dr. Bernardo Heater sent over to the pharmacy.  I called [facility] and spoke to the pharmacist.  She wants to make sure that we are aware that it is an early refill.    She ran the insurance and it does require a prior authorization.  She will fax over the PA information to the office.  I advised patient and he expressed understanding.

## 2021-03-21 NOTE — Telephone Encounter (Signed)
PA has been done, waiting on response.

## 2021-03-22 NOTE — Telephone Encounter (Signed)
Patient notified Testosterone has been approved until 03/21/2022.

## 2021-03-26 ENCOUNTER — Telehealth: Payer: Self-pay | Admitting: Family Medicine

## 2021-03-26 NOTE — Telephone Encounter (Signed)
Patient notified testosterone has been approved. Approval dates: 02/19/2021-03/21/2022

## 2021-04-09 ENCOUNTER — Other Ambulatory Visit: Payer: Medicare Other

## 2021-04-17 ENCOUNTER — Other Ambulatory Visit: Payer: Medicare Other

## 2021-04-17 ENCOUNTER — Other Ambulatory Visit: Payer: Self-pay

## 2021-04-17 DIAGNOSIS — E291 Testicular hypofunction: Secondary | ICD-10-CM

## 2021-04-17 DIAGNOSIS — R972 Elevated prostate specific antigen [PSA]: Secondary | ICD-10-CM | POA: Diagnosis not present

## 2021-04-18 ENCOUNTER — Encounter: Payer: Self-pay | Admitting: *Deleted

## 2021-04-18 LAB — TESTOSTERONE: Testosterone: 696 ng/dL (ref 264–916)

## 2021-04-21 ENCOUNTER — Other Ambulatory Visit: Payer: Self-pay | Admitting: Family Medicine

## 2021-05-24 ENCOUNTER — Other Ambulatory Visit: Payer: Self-pay | Admitting: Family Medicine

## 2021-06-05 ENCOUNTER — Ambulatory Visit: Payer: Medicare Other | Admitting: Family Medicine

## 2021-08-05 ENCOUNTER — Encounter: Payer: Self-pay | Admitting: Primary Care

## 2021-08-22 ENCOUNTER — Other Ambulatory Visit: Payer: Self-pay | Admitting: Family Medicine

## 2021-08-22 ENCOUNTER — Other Ambulatory Visit: Payer: Self-pay | Admitting: Urology

## 2021-08-22 DIAGNOSIS — E291 Testicular hypofunction: Secondary | ICD-10-CM

## 2021-08-30 ENCOUNTER — Other Ambulatory Visit: Payer: Medicare Other

## 2021-08-30 ENCOUNTER — Other Ambulatory Visit: Payer: Self-pay

## 2021-08-30 DIAGNOSIS — E291 Testicular hypofunction: Secondary | ICD-10-CM

## 2021-08-31 LAB — HEMATOCRIT: Hematocrit: 45.6 % (ref 37.5–51.0)

## 2021-08-31 LAB — TESTOSTERONE: Testosterone: 454 ng/dL (ref 264–916)

## 2021-09-03 ENCOUNTER — Encounter: Payer: Self-pay | Admitting: *Deleted

## 2021-10-25 ENCOUNTER — Ambulatory Visit: Payer: Medicare Other | Admitting: Family Medicine

## 2021-11-22 ENCOUNTER — Other Ambulatory Visit: Payer: Self-pay | Admitting: Family Medicine

## 2021-12-11 ENCOUNTER — Other Ambulatory Visit: Payer: Self-pay | Admitting: *Deleted

## 2021-12-11 DIAGNOSIS — R972 Elevated prostate specific antigen [PSA]: Secondary | ICD-10-CM

## 2021-12-11 DIAGNOSIS — E291 Testicular hypofunction: Secondary | ICD-10-CM

## 2021-12-20 ENCOUNTER — Other Ambulatory Visit: Payer: Self-pay

## 2021-12-20 ENCOUNTER — Ambulatory Visit (INDEPENDENT_AMBULATORY_CARE_PROVIDER_SITE_OTHER): Payer: Medicare Other | Admitting: Family Medicine

## 2021-12-20 VITALS — BP 128/78 | HR 78 | Temp 98.2°F | Resp 16 | Ht 71.0 in | Wt 240.0 lb

## 2021-12-20 DIAGNOSIS — I152 Hypertension secondary to endocrine disorders: Secondary | ICD-10-CM | POA: Diagnosis not present

## 2021-12-20 DIAGNOSIS — E1159 Type 2 diabetes mellitus with other circulatory complications: Secondary | ICD-10-CM | POA: Diagnosis not present

## 2021-12-20 DIAGNOSIS — F321 Major depressive disorder, single episode, moderate: Secondary | ICD-10-CM

## 2021-12-20 DIAGNOSIS — G250 Essential tremor: Secondary | ICD-10-CM

## 2021-12-20 LAB — POCT GLYCOSYLATED HEMOGLOBIN (HGB A1C): Hemoglobin A1C: 6.1 % — AB (ref 4.0–5.6)

## 2021-12-20 MED ORDER — LISINOPRIL 40 MG PO TABS: 40.0000 mg | ORAL_TABLET | Freq: Every day | ORAL | 3 refills | Status: DC

## 2021-12-20 NOTE — Progress Notes (Signed)
? ? Patient ID: Cody Day, male    DOB: 02-Nov-1955, 66 y.o.   MRN: [id number] ? ?This visit was conducted in person. ? ?BP 128/78   Pulse 78   Temp 98.2 ?F (36.8 ?C)   Resp 16   Ht 5\' 11"  (1.803 m)   Wt 240 lb (108.9 kg)   SpO2 98%   BMI 33.47 kg/m?   ? ?CC:  ?Chief Complaint  ?Patient presents with  ? Diabetes  ? ? ?Subjective:  ? ?HPI: ?Cody Day is a 66 y.o. male presenting on 12/20/2021 for Diabetes ? ? ?  Diabetes:  Improved with diet change. Has increased water,  using stevia. ?Lab Results  ?Component Value Date  ? HGBA1C 6.1 (A) 12/20/2021  ?Using medications without difficulties: ?Hypoglycemic episodes: none ?Hyperglycemic episodes:none ?Feet problems: no ulcers ?Blood Sugars averaging:not checking ?eye exam within last year: due ? ?Wt Readings from Last 3 Encounters:  ?12/20/21 240 lb (108.9 kg)  ?02/27/21 241 lb 12.8 oz (109.7 kg)  ?02/22/21 243 lb (110.2 kg)  ? ?Hypertension:   At goal on lisinopril 40 mg daily ?BP Readings from Last 3 Encounters:  ?12/20/21 128/78  ?02/27/21 (!) 148/86  ?02/22/21 (!) 161/108  ?Using medication without problems or lightheadedness: none ?Chest pain with exertion:none ?Edema:none ?Short of breath:stable, poor ?Average home BPs: ?Other issues: ? ?He has had a continued decline of worsening tremor and balance issues... Saw Dr.Tat in the past. ? BBlockers not great option given asthma issues. ? Has tried  primidone, mysoline in past. ? ?He does not likely how citalopram makes him feel.. plans to wean off... zoned out/zombie like. ? May also be interfering with memory. ? ?Relevant past medical, surgical, family and social history reviewed and updated as indicated. Interim medical history since our last visit reviewed. ?Allergies and medications reviewed and updated. ?Outpatient Medications Prior to Visit  ?Medication Sig Dispense Refill  ? albuterol (VENTOLIN HFA) 108 (90 Base) MCG/ACT inhaler Inhale 2 puffs into the lungs every 6 (six) hours as needed for wheezing  or shortness of breath. 8 g 2  ? allopurinol (ZYLOPRIM) 300 MG tablet TAKE 1 TABLET BY MOUTH DAILY 90 tablet 3  ? citalopram (CELEXA) 40 MG tablet TAKE 1 TABLET BY MOUTH DAILY 90 tablet 0  ? Fluticasone-Umeclidin-Vilant (TRELEGY ELLIPTA) 100-62.5-25 MCG/INH AEPB Inhale 1 puff into the lungs daily. 1 each 11  ? lisinopril (ZESTRIL) 40 MG tablet Take 1 tablet (40 mg total) by mouth daily. 30 tablet 11  ? montelukast (SINGULAIR) 10 MG tablet TAKE 1 TABLET BY MOUTH DAILY 90 tablet 3  ? Multiple Vitamin (MULTIVITAMIN WITH MINERALS) TABS tablet Take 1 tablet by mouth daily. 30 tablet 2  ? simvastatin (ZOCOR) 20 MG tablet TAKE 1 TABLET BY MOUTH DAILY 90 tablet 3  ? testosterone cypionate (DEPOTESTOSTERONE CYPIONATE) 200 MG/ML injection INJECT 0.3 ML INTO THE MUSCLE ONCE A WEEK 5 mL 0  ? ?No facility-administered medications prior to visit.  ?  ? ?Per HPI unless specifically indicated in ROS section below ?Review of Systems  ?Constitutional:  Negative for fatigue and fever.  ?HENT:  Negative for ear pain.   ?Eyes:  Negative for pain.  ?Respiratory:  Positive for shortness of breath. Negative for cough.   ?Cardiovascular:  Negative for chest pain, palpitations and leg swelling.  ?Gastrointestinal:  Negative for abdominal pain.  ?Genitourinary:  Negative for dysuria.  ?Musculoskeletal:  Negative for arthralgias.  ?Neurological:  Positive for tremors. Negative for syncope, light-headedness and headaches.  ?  Psychiatric/Behavioral:  Negative for dysphoric mood.   ?Objective:  ?BP 128/78   Pulse 78   Temp 98.2 ?F (36.8 ?C)   Resp 16   Ht 5\' 11"  (1.803 m)   Wt 240 lb (108.9 kg)   SpO2 98%   BMI 33.47 kg/m?   ?Wt Readings from Last 3 Encounters:  ?12/20/21 240 lb (108.9 kg)  ?02/27/21 241 lb 12.8 oz (109.7 kg)  ?02/22/21 243 lb (110.2 kg)  ?  ?  ?Physical Exam ?Constitutional:   ?   Appearance: He is well-developed.  ?HENT:  ?   Head: Normocephalic.  ?   Right Ear: Hearing normal.  ?   Left Ear: Hearing normal.  ?   Nose:  Nose normal.  ?Neck:  ?   Thyroid: No thyroid mass or thyromegaly.  ?   Vascular: No carotid bruit.  ?   Trachea: Trachea normal.  ?Cardiovascular:  ?   Rate and Rhythm: Normal rate and regular rhythm.  ?   Pulses: Normal pulses.  ?   Heart sounds: Heart sounds not distant. No murmur heard. ?  No friction rub. No gallop.  ?   Comments: No peripheral edema ?Pulmonary:  ?   Effort: Pulmonary effort is normal. No respiratory distress.  ?   Breath sounds: Decreased air movement present. No wheezing or rhonchi.  ?Skin: ?   General: Skin is warm and dry.  ?   Findings: No rash.  ?Neurological:  ?   Sensory: Sensation is intact.  ?   Motor: Tremor present.  ?   Coordination: Romberg sign positive. Coordination abnormal.  ?   Gait: Gait abnormal.  ?Psychiatric:     ?   Speech: Speech normal.     ?   Behavior: Behavior normal.     ?   Thought Content: Thought content normal.  ?Primidone, Mysoline.  Beta-blocker ?   ?Results for orders placed or performed in visit on 12/20/21  ?POCT HgB A1C  ?Result Value Ref Range  ? Hemoglobin A1C 6.1 (A) 4.0 - 5.6 %  ? HbA1c POC (<> result, manual entry)    ? HbA1c, POC (prediabetic range)    ? HbA1c, POC (controlled diabetic range)    ? ? ?This visit occurred during the SARS-CoV-2 public health emergency.  Safety protocols were in place, including screening questions prior to the visit, additional usage of staff PPE, and extensive cleaning of exam room while observing appropriate contact time as indicated for disinfecting solutions.  ? ?COVID 19 screen:  No recent travel or known exposure to [place] ?The patient denies respiratory symptoms of COVID 19 at this time. ?The importance of social distancing was discussed today.  ? ?Assessment and Plan ?Problem List Items Addressed This Visit   ? ? Benign essential tremor  ?  Chronic,. Progressive ? ?Encouraged him to contact Dr. Carles Collet his neurologist for consideration of other medication options to treat this.  In the past he has failed or had  side effects to several medications. BBlocker not great option in asthmatic. ?  ?  ? Controlled diabetes mellitus with circulatory complication  ( HTN) ([place]) - Primary  ?  Stable, chronic.  Diet controlled. ?Encouraged exercise, weight loss, healthy eating habits. ? ? ? ? ?  ?  ? Relevant Medications  ? lisinopril (ZESTRIL) 40 MG tablet  ? Other Relevant Orders  ? POCT HgB A1C (Completed)  ? Hypertension associated with diabetes ([place])  ? Relevant Medications  ? lisinopril (ZESTRIL) 40 MG tablet  ?  MDD (major depressive disorder), single episode, moderate ([place])  ?  Chronic, stable ? ? ? No clear response and definite side effects to citalopram.  We discussed the safe way to wean off this medication over the next few weeks.  He will watch for return of mood issues.  Hopefully coming off this medication will help with multiple issues including memory, fatigue, possibly balance issues and unlikely tremor issues. ?  ?  ? ? ?  ? ?Eliezer Lofts, MD   ?

## 2021-12-20 NOTE — Patient Instructions (Addendum)
Work on low [employer].. cut back on cookies and candy. ?Set up yearly eye exam for diabetes and have the opthalmologist send [place] a copy of the evaluation for the chart. ? Call Dr. Carles Collet for further tremor treatment options. ? Wean off citalopram given SE. ?

## 2021-12-20 NOTE — Assessment & Plan Note (Signed)
Stable, chronic.  Diet controlled. ?Encouraged exercise, weight loss, healthy eating habits. ? ? ? ? ?

## 2021-12-20 NOTE — Assessment & Plan Note (Signed)
Chronic, stable ? ? ? No clear response and definite side effects to citalopram.  We discussed the safe way to wean off this medication over the next few weeks.  He will watch for return of mood issues.  Hopefully coming off this medication will help with multiple issues including memory, fatigue, possibly balance issues and unlikely tremor issues. ?

## 2021-12-20 NOTE — Assessment & Plan Note (Signed)
Chronic,. Progressive ? ?Encouraged him to contact Dr. Carles Collet his neurologist for consideration of other medication options to treat this.  In the past he has failed or had side effects to several medications. BBlocker not great option in asthmatic. ?

## 2022-01-17 ENCOUNTER — Telehealth: Payer: Self-pay | Admitting: Family Medicine

## 2022-01-17 NOTE — Telephone Encounter (Signed)
Cody Day notified as instructed by telephone.  Patient states understanding.  ?

## 2022-01-17 NOTE — Telephone Encounter (Signed)
Pt said started with symptoms on 01/13/22 but symptoms worsened on 01/14/22. Pt has prod cough with green phlegm, pt has H/A at forehead and eyes hurt, pt is not sure if fever or not but pt has chills. Pt has been in bed 4 days. Pt has SOB usually with COPD but now pt has SOB upon any exertion. Pt has slight wheezing on and off. Pt has S/t that hurts when swallows with burning sensation. Pt has runny nose and head congestion. Pt will ck a home covid test this evening and if positive pt will cb. Pt scheduled appt with DR Diona Browner on 01/18/22 at 10:20 but pt note sent to Dr Diona Browner so if Dr Diona Browner will pt request prednisone and abx sent to [facility]. Pt did not want to go to UC today but UC & ED precautions were given and pt voiced understanding. Sending note to Dr Diona Browner and Butch Penny CMA. Will also teams Butch Penny. ?

## 2022-01-17 NOTE — Telephone Encounter (Signed)
Please triage

## 2022-01-17 NOTE — Telephone Encounter (Signed)
Pt called stating that he has been coughing up green mucus. Pt is asking if he could get a prescription called in. I told pt he would have to make an appt ,pt stated that Dr Cody Day would call something in. Please advise. ?

## 2022-01-17 NOTE — Telephone Encounter (Signed)
Patient need to be seen prior to prescribing. If shortness of breath significant go to Urgent Care tonight.Marland Kitchen otherwise will eval tommorow morning. ?

## 2022-01-18 ENCOUNTER — Encounter: Payer: Self-pay | Admitting: Family Medicine

## 2022-01-18 ENCOUNTER — Ambulatory Visit (INDEPENDENT_AMBULATORY_CARE_PROVIDER_SITE_OTHER): Payer: Medicare Other | Admitting: Family Medicine

## 2022-01-18 VITALS — BP 140/80 | HR 87 | Temp 98.2°F | Ht 71.0 in | Wt 239.1 lb

## 2022-01-18 DIAGNOSIS — R051 Acute cough: Secondary | ICD-10-CM

## 2022-01-18 DIAGNOSIS — J4551 Severe persistent asthma with (acute) exacerbation: Secondary | ICD-10-CM

## 2022-01-18 LAB — POC COVID19 BINAXNOW: SARS Coronavirus 2 Ag: NEGATIVE

## 2022-01-18 MED ORDER — AZITHROMYCIN 250 MG PO TABS: ORAL_TABLET | ORAL | 0 refills | Status: DC

## 2022-01-18 MED ORDER — PREDNISONE 20 MG PO TABS: ORAL_TABLET | ORAL | 0 refills | Status: DC

## 2022-01-18 NOTE — Patient Instructions (Signed)
Rest, fluids. ? We will call with COVID test. ? Start antibiotics and prednisone taper. ? Go to ER if severe shortness of breath. ?

## 2022-01-18 NOTE — Assessment & Plan Note (Signed)
Acute exacerbation of chronic asthma/COPD ?Will evaluate for COVID given recent worsening. ?Most likely bacterial infection given initial symptoms greater than 2 weeks ago and now with change in sputum.  Will treat with azithromycin x5 days as well as prednisone taper.  He will continue controller inhalers and use albuterol rescue as needed.  ER and return precautions were given. ?

## 2022-01-18 NOTE — Progress Notes (Signed)
? ? Patient ID: Cody Day, male    DOB: 23-Dec-1955, 66 y.o.   MRN: [id number] ? ?This visit was conducted in person. ? ?BP 140/80   Pulse 87   Temp 98.2 ?F (36.8 ?C) (Oral)   Ht '5\' 11"'$  (1.803 m)   Wt 239 lb 1 oz (108.4 kg)   SpO2 95%   BMI 33.34 kg/m?   ? ?CC: ?Chief Complaint  ?Patient presents with  ? Cough  ?  With Green Phlegm-Started on Sunday-No Covid Test Done  ? ? ?Subjective:  ? ?HPI: ?Cody Day is a 66 y.o. male  with COPD, asthma and DM presenting on 01/18/2022 for Cough (With Green Phlegm-Started on Sunday-No Covid Test Done) ? ? Date of onset:   2 weeks off and on, worse in last  week. ? ? Started  with dry cough.. progressed to green mucus. No fever ? Back and chest ache from coughing. ?  Has ST. ?No sinus pain, no ear pain. Some headache. ? Cough keeping him up at night. ? Constant wheeze, SO worse than usual. ? ? Oxygen at home 95% ? ? ? ?COVID 19 screen ?COVID testing: none ?COVID vaccine: 04/24/2020 , 04/01/2020 ?COVID exposure: No recent travel or known exposure to [place] ? The importance of social distancing was discussed today.  ? ?   ? ?Relevant past medical, surgical, family and social history reviewed and updated as indicated. Interim medical history since our last visit reviewed. ?Allergies and medications reviewed and updated. ?Outpatient Medications Prior to Visit  ?Medication Sig Dispense Refill  ? albuterol (VENTOLIN HFA) 108 (90 Base) MCG/ACT inhaler Inhale 2 puffs into the lungs every 6 (six) hours as needed for wheezing or shortness of breath. 8 g 2  ? allopurinol (ZYLOPRIM) 300 MG tablet TAKE 1 TABLET BY MOUTH DAILY 90 tablet 3  ? citalopram (CELEXA) 40 MG tablet TAKE 1 TABLET BY MOUTH DAILY 90 tablet 0  ? Fluticasone-Umeclidin-Vilant (TRELEGY ELLIPTA) 100-62.5-25 MCG/INH AEPB Inhale 1 puff into the lungs daily. 1 each 11  ? lisinopril (ZESTRIL) 40 MG tablet Take 1 tablet (40 mg total) by mouth daily. 90 tablet 3  ? montelukast (SINGULAIR) 10 MG tablet TAKE 1 TABLET BY  MOUTH DAILY 90 tablet 3  ? Multiple Vitamin (MULTIVITAMIN WITH MINERALS) TABS tablet Take 1 tablet by mouth daily. 30 tablet 2  ? simvastatin (ZOCOR) 20 MG tablet TAKE 1 TABLET BY MOUTH DAILY 90 tablet 3  ? testosterone cypionate (DEPOTESTOSTERONE CYPIONATE) 200 MG/ML injection INJECT 0.3 ML INTO THE MUSCLE ONCE A WEEK 5 mL 0  ? ?No facility-administered medications prior to visit.  ?  ? ?Per HPI unless specifically indicated in ROS section below ?Review of Systems  ?Constitutional:  Negative for fatigue and fever.  ?HENT:  Negative for ear pain.   ?Eyes:  Negative for pain.  ?Respiratory:  Negative for cough and shortness of breath.   ?Cardiovascular:  Negative for chest pain, palpitations and leg swelling.  ?Gastrointestinal:  Negative for abdominal pain.  ?Genitourinary:  Negative for dysuria.  ?Musculoskeletal:  Negative for arthralgias.  ?Neurological:  Negative for syncope, light-headedness and headaches.  ?Psychiatric/Behavioral:  Negative for dysphoric mood.   ?Objective:  ?BP 140/80   Pulse 87   Temp 98.2 ?F (36.8 ?C) (Oral)   Ht '5\' 11"'$  (1.803 m)   Wt 239 lb 1 oz (108.4 kg)   SpO2 95%   BMI 33.34 kg/m?   ?Wt Readings from Last 3 Encounters:  ?01/18/22 239 lb 1  oz (108.4 kg)  ?12/20/21 240 lb (108.9 kg)  ?02/27/21 241 lb 12.8 oz (109.7 kg)  ?  ?  ?Physical Exam ?Constitutional:   ?   Appearance: He is well-developed.  ?HENT:  ?   Head: Normocephalic.  ?   Right Ear: Hearing normal.  ?   Left Ear: Hearing normal.  ?   Nose: Nose normal.  ?Neck:  ?   Thyroid: No thyroid mass or thyromegaly.  ?   Vascular: No carotid bruit.  ?   Trachea: Trachea normal.  ?Cardiovascular:  ?   Rate and Rhythm: Normal rate and regular rhythm.  ?   Pulses: Normal pulses.  ?   Heart sounds: Heart sounds not distant. No murmur heard. ?  No friction rub. No gallop.  ?   Comments: No peripheral edema ?Pulmonary:  ?   Effort: Pulmonary effort is normal. No respiratory distress.  ?   Breath sounds: Wheezing present.  ?Skin: ?    General: Skin is warm and dry.  ?   Findings: No rash.  ?Psychiatric:     ?   Speech: Speech normal.     ?   Behavior: Behavior normal.     ?   Thought Content: Thought content normal.  ? ?   ?Results for orders placed or performed in visit on 12/20/21  ?POCT HgB A1C  ?Result Value Ref Range  ? Hemoglobin A1C 6.1 (A) 4.0 - 5.6 %  ? HbA1c POC (<> result, manual entry)    ? HbA1c, POC (prediabetic range)    ? HbA1c, POC (controlled diabetic range)    ? ? ?This visit occurred during the SARS-CoV-2 public health emergency.  Safety protocols were in place, including screening questions prior to the visit, additional usage of staff PPE, and extensive cleaning of exam room while observing appropriate contact time as indicated for disinfecting solutions.  ? ?COVID 19 screen:  No recent travel or known exposure to [place] ?The patient denies respiratory symptoms of COVID 19 at this time. ?The importance of social distancing was discussed today.  ? ?Assessment and Plan ?Problem List Items Addressed This Visit   ? ? Chronic asthma  ?  Acute exacerbation of chronic asthma/COPD ?Will evaluate for COVID given recent worsening. ?Most likely bacterial infection given initial symptoms greater than 2 weeks ago and now with change in sputum.  Will treat with azithromycin x5 days as well as prednisone taper.  He will continue controller inhalers and use albuterol rescue as needed.  ER and return precautions were given. ?  ?  ? Relevant Medications  ? predniSONE (DELTASONE) 20 MG tablet  ? ?Other Visit Diagnoses   ? ? Acute cough    -  Primary  ? Relevant Orders  ? POC COVID-19  ? ?  ? ?Addendum: Negative COVID test ?  ? ?Eliezer Lofts, MD  ? ?

## 2022-02-22 ENCOUNTER — Other Ambulatory Visit: Payer: Medicare Other

## 2022-02-22 DIAGNOSIS — R972 Elevated prostate specific antigen [PSA]: Secondary | ICD-10-CM

## 2022-02-22 DIAGNOSIS — E291 Testicular hypofunction: Secondary | ICD-10-CM | POA: Diagnosis not present

## 2022-02-23 LAB — HEMATOCRIT: Hematocrit: 44.8 % (ref 37.5–51.0)

## 2022-02-23 LAB — TESTOSTERONE: Testosterone: 753 ng/dL (ref 264–916)

## 2022-02-23 LAB — PSA: Prostate Specific Ag, Serum: 0.5 ng/mL (ref 0.0–4.0)

## 2022-02-27 ENCOUNTER — Ambulatory Visit (INDEPENDENT_AMBULATORY_CARE_PROVIDER_SITE_OTHER): Payer: Medicare Other | Admitting: Urology

## 2022-02-27 ENCOUNTER — Encounter: Payer: Self-pay | Admitting: Urology

## 2022-02-27 ENCOUNTER — Other Ambulatory Visit: Payer: Self-pay | Admitting: Family Medicine

## 2022-02-27 VITALS — BP 168/85 | HR 89 | Ht 71.0 in | Wt 230.0 lb

## 2022-02-27 DIAGNOSIS — Z125 Encounter for screening for malignant neoplasm of prostate: Secondary | ICD-10-CM | POA: Diagnosis not present

## 2022-02-27 DIAGNOSIS — E291 Testicular hypofunction: Secondary | ICD-10-CM

## 2022-02-27 NOTE — Progress Notes (Signed)
? ?02/27/2022 ?10:26 AM  ? ?Cody Day ?1956-01-30 ?[id number] ? ?Referring provider: [name], MD ?[facility] ?[place],  [place] [address] ? ?Chief Complaint  ?Patient presents with  ? Hypogonadism  ? ? ?Urologic history: ?1.  Hypogonadism ?-Testosterone cypionate 0.3 cc weekly ?-MR fusion biopsy 07/2019 for abnormal PSA velocity (PI-RADS 3 lesion), benign pathology ?  ? ?HPI: ?66 y.o. male presents for annual follow-up. ? ?No problems since last years visit ?Decreased energy level and libido ?Denies bothersome LUTS ?Denies breast tenderness/enlargement ?Labs 02/21/2021: Testosterone 753 ng/dL; PSA 0.5; hematocrit 44.8 ? ? ?PMH: ?Past Medical History:  ?Diagnosis Date  ? Achilles tendon rupture 10/11  ? and repair  ? Adenomatous polyp of colon 04/06/2010  ? Adenomatous polyps ?3 in the descending colon, times one in the ascending colon [facility])  ? Asthma   ? COPD (chronic obstructive pulmonary disease) ([place])   ? Gout   ? Hemorrhoids   ? Hypercholesteremia   ? Hypertension   ? Sleep apnea   ? doesn't wear CPAP  ? Syncope 10/11  ? in settin gof asthma exacerbation w coughing. Ech (10/11): EF > 55%, mild LVH, grade I diastolic dysfunction, nomral RV size and systolic function,. normal valves. Carotid US (10/11): minimal disease  ? ? ?Surgical History: ?Past Surgical History:  ?Procedure Laterality Date  ? ABDOMINAL HERNIA REPAIR  1998,2000  ? calcium kidney stones    ? CARDIAC CATHETERIZATION    ? [facility]   ? COLONOSCOPY  2011  ? [facility], [name], [place]; Dr. [name]. Multiple adenomatous polyps (total 4).   ? COLONOSCOPY WITH PROPOFOL N/A 08/30/2015  ? Procedure: COLONOSCOPY WITH PROPOFOL;  Surgeon: [name], MD;  [facility] END[facility];  Service: Endoscopy;  Laterality: N/A;  ? FEMORAL HERNIA REPAIR  2007  ? HEMORRHOID SURGERY    ? HERNIA REPAIR  09/10/2010  ? Repair of recurrent ventral hernia, resection of previously placed mesh x2, implantation 7.5 x 10 cm  Gore-Tex dual mesh in an underlay position, repair of epigastric hernia with a 4.2 cm Proceed ventral patch.  ? HERNIA REPAIR   02//28/2007  ? Laparoscopic right inguinal hernia repair with Surgipro mesh, Ventrilex patch at umbilicus  ? HERNIA REPAIR  11/01/1990  .  ? Small oval Kugel patch placed in preperitoneal space, [name], MD  ? HERNIA REPAIR  01/17/1997  ? Recurrent ventral hernia 15 x 19 Gore-Tex dual mesh placed laparoscopically with multiple lefft--sided ports, [name], MD  ? HERNIA REPAIR  01/07/1995   ? Primary repair of ventral hernia. [name], MD  ? intestinal blockage    ? as a child  ? KIDNEY STONE extraction    ? unic acid stones  ? left heart cath  2008  ? minimal luminal irregularities EF 55%  ? RIGHT/LEFT HEART CATH AND CORONARY ANGIOGRAPHY N/A 01/13/2017  ? Procedure: Right/Left Heart Cath and Coronary Angiography;  Surgeon: [name], MD;  Location: [facility];  Service: Cardiovascular;  Laterality: N/A;  ? treadmill stress test  2003  ? ? ?Home Medications:  ?Allergies as of 02/27/2022   ? ?   Reactions  ? Levaquin [levofloxacin In D5w]   ? Severe headaches, skin tingling/redness  ? Penicillins Hives, Rash  ? Has patient had a PCN reaction causing immediate rash, facial/tongue/throat swelling, SOB or lightheadedness with hypotension: Yes ?Has patient had a PCN reaction causing severe rash involving mucus membranes or skin necrosis: Unknown ?Has patient  had a PCN reaction that required hospitalization: No ?Has patient had a PCN reaction occurring within the last 10 years: No - Childhood reaction ?If all of the above answers are "NO", then may proceed with Cephalosporin use.  ? ?  ? ?  ?Medication List  ?  ? ?  ? Accurate as of Feb 27, 2022 10:26 AM. If you have any questions, ask your nurse or doctor.  ?  ?  ? ?  ? ?STOP taking these medications   ? ?azithromycin 250 MG tablet ?Commonly known as: ZITHROMAX ?Stopped by: Abbie Sons, MD ?  ?predniSONE 20 MG  tablet ?Commonly known as: DELTASONE ?Stopped by: Abbie Sons, MD ?  ? ?  ? ?TAKE these medications   ? ?albuterol 108 (90 Base) MCG/ACT inhaler ?Commonly known as: VENTOLIN HFA ?Inhale 2 puffs into the lungs every 6 (six) hours as needed for wheezing or shortness of breath. ?  ?allopurinol 300 MG tablet ?Commonly known as: ZYLOPRIM ?TAKE 1 TABLET BY MOUTH DAILY ?  ?citalopram 40 MG tablet ?Commonly known as: CELEXA ?TAKE 1 TABLET BY MOUTH DAILY ?  ?lisinopril 40 MG tablet ?Commonly known as: ZESTRIL ?Take 1 tablet (40 mg total) by mouth daily. ?  ?montelukast 10 MG tablet ?Commonly known as: SINGULAIR ?TAKE 1 TABLET BY MOUTH DAILY ?  ?multivitamin with minerals Tabs tablet ?Take 1 tablet by mouth daily. ?  ?simvastatin 20 MG tablet ?Commonly known as: ZOCOR ?TAKE 1 TABLET BY MOUTH DAILY ?  ?testosterone cypionate 200 MG/ML injection ?Commonly known as: DEPOTESTOSTERONE CYPIONATE ?INJECT 0.3 ML INTO THE MUSCLE ONCE A WEEK ?  ?Trelegy Ellipta 100-62.5-25 MCG/ACT Aepb ?Generic drug: Fluticasone-Umeclidin-Vilant ?Inhale 1 puff into the lungs daily. ?  ? ?  ? ? ?Allergies:  ?Allergies  ?Allergen Reactions  ? Levaquin [Levofloxacin In D5w]   ?  Severe headaches, skin tingling/redness  ? Penicillins Hives and Rash  ?  Has patient had a PCN reaction causing immediate rash, facial/tongue/throat swelling, SOB or lightheadedness with hypotension: Yes ?Has patient had a PCN reaction causing severe rash involving mucus membranes or skin necrosis: Unknown ?Has patient had a PCN reaction that required hospitalization: No ?Has patient had a PCN reaction occurring within the last 10 years: No - Childhood reaction ?If all of the above answers are "NO", then may proceed with Cephalosporin use. ?  ? ? ?Family History: ?Family History  ?Problem Relation Age of Onset  ? Hypertension Father   ? Cataracts Father   ? Diabetes Father   ? Heart attack Mother 45  ? Coronary artery disease Mother   ? Dementia Mother   ? Hypertension Sister    ? Depression Sister   ? Hypertension Brother   ? Colon cancer Brother   ? Diabetes Other   ?     1st degree relative  ? Colon cancer Maternal Aunt   ? ? ?Social History:  reports that he quit smoking about 26 years ago. His smoking use included cigarettes. He has a 8.75 pack-year smoking history. His smokeless tobacco use includes chew. He reports that he does not currently use alcohol. He reports that he does not use drugs. ? ? ?Physical Exam: ?BP (!) 168/85   Pulse 89   Ht '5\' 11"'$  (1.803 m)   Wt 230 lb (104.3 kg)   BMI 32.08 kg/m?   ?Constitutional:  Alert and oriented, No acute distress. ?HEENT: [place] AT, moist mucus membranes.  Trachea midline, no masses. ?Cardiovascular: No clubbing, cyanosis, or edema. ?  Respiratory: Normal respiratory effort, no increased work of breathing. ?GU: Declined DRE; stable PSA ?Psychiatric: Normal mood and affect. ? ? ?Assessment & Plan:   ? ?1.  Hypogonadism ?Stable symptoms/labs ?Lab visit 6 months for testosterone/hematocrit ?1 year follow-up office visit testosterone, PSA, hematocrit and DRE ?Testosterone was refilled ? ? ?Abbie Sons, MD ? ?[facility] ?[address] ?[place], [place] [address] ?[phone] ? ?

## 2022-03-01 ENCOUNTER — Other Ambulatory Visit: Payer: Self-pay | Admitting: Urology

## 2022-03-01 DIAGNOSIS — E291 Testicular hypofunction: Secondary | ICD-10-CM

## 2022-03-22 ENCOUNTER — Ambulatory Visit (INDEPENDENT_AMBULATORY_CARE_PROVIDER_SITE_OTHER): Payer: Medicare Other | Admitting: Family Medicine

## 2022-03-22 ENCOUNTER — Encounter: Payer: Self-pay | Admitting: Family Medicine

## 2022-03-22 VITALS — BP 140/86 | HR 68 | Temp 98.0°F | Ht 71.0 in | Wt 236.2 lb

## 2022-03-22 DIAGNOSIS — I739 Peripheral vascular disease, unspecified: Secondary | ICD-10-CM | POA: Insufficient documentation

## 2022-03-22 DIAGNOSIS — J42 Unspecified chronic bronchitis: Secondary | ICD-10-CM | POA: Insufficient documentation

## 2022-03-22 DIAGNOSIS — Z23 Encounter for immunization: Secondary | ICD-10-CM

## 2022-03-22 DIAGNOSIS — I152 Hypertension secondary to endocrine disorders: Secondary | ICD-10-CM | POA: Diagnosis not present

## 2022-03-22 DIAGNOSIS — I420 Dilated cardiomyopathy: Secondary | ICD-10-CM | POA: Diagnosis not present

## 2022-03-22 DIAGNOSIS — Z0000 Encounter for general adult medical examination without abnormal findings: Secondary | ICD-10-CM

## 2022-03-22 DIAGNOSIS — E559 Vitamin D deficiency, unspecified: Secondary | ICD-10-CM

## 2022-03-22 DIAGNOSIS — E1159 Type 2 diabetes mellitus with other circulatory complications: Secondary | ICD-10-CM

## 2022-03-22 DIAGNOSIS — R5383 Other fatigue: Secondary | ICD-10-CM

## 2022-03-22 LAB — VITAMIN D 25 HYDROXY (VIT D DEFICIENCY, FRACTURES): VITD: 35.01 ng/mL (ref 30.00–100.00)

## 2022-03-22 LAB — LIPID PANEL
Cholesterol: 159 mg/dL (ref 0–200)
HDL: 53.1 mg/dL (ref >39.00)
LDL Cholesterol: 75 mg/dL (ref 0–99)
NonHDL: 105.83
Total CHOL/HDL Ratio: 3
Triglycerides: 152 mg/dL — ABNORMAL HIGH (ref 0.0–149.0)
VLDL: 30.4 mg/dL (ref 0.0–40.0)

## 2022-03-22 LAB — COMPREHENSIVE METABOLIC PANEL
ALT: 18 U/L (ref 0–53)
AST: 19 U/L (ref 0–37)
Albumin: 4.6 g/dL (ref 3.5–5.2)
Alkaline Phosphatase: 76 U/L (ref 39–117)
BUN: 9 mg/dL (ref 6–23)
CO2: 30 mEq/L (ref 19–32)
Calcium: 9.8 mg/dL (ref 8.4–10.5)
Chloride: 100 mEq/L (ref 96–112)
Creatinine, Ser: 0.71 mg/dL (ref 0.40–1.50)
GFR: 96.39 mL/min (ref >60.00)
Glucose, Bld: 92 mg/dL (ref 70–99)
Potassium: 4.4 mEq/L (ref 3.5–5.1)
Sodium: 138 mEq/L (ref 135–145)
Total Bilirubin: 1.2 mg/dL (ref 0.2–1.2)
Total Protein: 7 g/dL (ref 6.0–8.3)

## 2022-03-22 LAB — HM DIABETES FOOT EXAM

## 2022-03-22 LAB — VITAMIN B12: Vitamin B-12: 322 pg/mL (ref 211–911)

## 2022-03-22 LAB — TSH: TSH: 2.01 u[IU]/mL (ref 0.35–5.50)

## 2022-03-22 NOTE — Assessment & Plan Note (Signed)
Followed by pulmonary 

## 2022-03-22 NOTE — Patient Instructions (Addendum)
Set up yearly eye exam for diabetes and have the opthalmologist send [place] a copy of the evaluation for the chart. Call to set up appt for yearly check with cardiologist and pulmonary. Cody Day stop at the lab to have labs drawn. Consider shingles and tetanus vaccine at pharmacy

## 2022-03-22 NOTE — Progress Notes (Signed)
Patient ID: Cody Day, male    DOB: 01-04-1956, 66 y.o.   MRN: [id number]  This visit was conducted in person.  BP (!) 150/90   Pulse 68   Temp 98 F (36.7 C) (Oral)   Ht '5\' 11"'$  (1.803 m)   Wt 236 lb 4 oz (107.2 kg)   SpO2 98%   BMI 32.95 kg/m    CC:  Chief Complaint  Patient presents with   Medicare Wellness    Subjective:   HPI: Cody Day is a 66 y.o. male presenting on 03/22/2022 for Medicare Wellness  The patient presents for annual medicare wellness, complete physical and review of chronic health problems. He/She also has the following acute concerns today: none  I have personally reviewed the Medicare Annual Wellness questionnaire and have noted 1. The patient's medical and social history 2. Their use of alcohol, tobacco or illicit drugs 3. Their current medications and supplements 4. The patient's functional ability including ADL's, fall risks, home safety risks and hearing or visual             impairment. 5. Diet and physical activities 6. Evidence for depression or mood disorders 7.         Updated provider list Cognitive evaluation was performed and recorded on pt medicare questionnaire form. The patients weight, height, BMI and visual acuity have been recorded in the chart   I have made referrals, counseling and provided education to the patient based review of the above and I have provided the pt with a written personalized care plan for preventive services.   Documentation of this information was scanned into the electronic record under the media tab.   Advance directives and end of life planning reviewed in detail with patient and documented in EMR. Patient given handout on advance care directives if needed. HCPOA and living will updated if needed.  No falls in last 12 months.  Hearing Screening  Method: Audiometry   '500Hz'$  '1000Hz'$  '2000Hz'$  '4000Hz'$   Right ear '20 20 20 20  '$ Left ear '20 20 20 20   '$ Vision Screening   Right eye Left eye Both eyes   Without correction '20/25 20/30 20/20 '$  With correction      Flowsheet Row Office Visit from 03/22/2022 in [place] [facility] Total Score 0      Diabetes:   Diet controlled. Lab Results  Component Value Date   HGBA1C 6.1 (A) 12/20/2021  Using medications without difficulties: Hypoglycemic episodes: Hyperglycemic episodes: Feet problems: foot exam due.. no issues Blood Sugars averaging: not checking. eye exam within last year: due  Wt Readings from Last 3 Encounters:  03/22/22 236 lb 4 oz (107.2 kg)  02/27/22 230 lb (104.3 kg)  01/18/22 239 lb 1 oz (108.4 kg)     ECHO 2021 EF 79-02%, diastolic dysfunction   COPD followed by pulmonary  Hypertension:   Poor control  in office Today on lisinopril 40 mg daily.Marland Kitchen at home he reports BP Readings from Last 3 Encounters:  03/22/22 (!) 150/90  02/27/22 (!) 168/85  01/18/22 140/80  Using medication without problems or lightheadedness:  Chest pain with exertion: Edema: Short of breath: Average home BPs: Other issues:      Relevant past medical, surgical, family and social history reviewed and updated as indicated. Interim medical history since our last visit reviewed. Allergies and medications reviewed and updated. Outpatient Medications Prior to Visit  Medication Sig Dispense Refill   albuterol (VENTOLIN HFA) 108 (  90 Base) MCG/ACT inhaler Inhale 2 puffs into the lungs every 6 (six) hours as needed for wheezing or shortness of breath. 8 g 2   allopurinol (ZYLOPRIM) 300 MG tablet TAKE 1 TABLET BY MOUTH DAILY 90 tablet 3   citalopram (CELEXA) 40 MG tablet TAKE 1 TABLET BY MOUTH DAILY (Patient taking differently: 20 mg.) 90 tablet 0   lisinopril (ZESTRIL) 40 MG tablet Take 1 tablet (40 mg total) by mouth daily. 90 tablet 3   montelukast (SINGULAIR) 10 MG tablet TAKE 1 TABLET BY MOUTH DAILY 90 tablet 3   Multiple Vitamin (MULTIVITAMIN WITH MINERALS) TABS tablet Take 1 tablet by mouth daily. 30 tablet 2    simvastatin (ZOCOR) 20 MG tablet TAKE 1 TABLET BY MOUTH DAILY 90 tablet 3   testosterone cypionate (DEPOTESTOSTERONE CYPIONATE) 200 MG/ML injection INJECT 0.3 ML INTO THE MUSCLE ONCE A WEEK 5 mL 0   TRELEGY ELLIPTA 100-62.5-25 MCG/ACT AEPB INHALE 1 PUFF INTO THE LUNGS DAILY 60 each 2   No facility-administered medications prior to visit.     Per HPI unless specifically indicated in ROS section below Review of Systems  Constitutional:  Negative for fatigue and fever.  HENT:  Negative for ear pain.   Eyes:  Negative for pain.  Respiratory:  Positive for cough, shortness of breath and wheezing.   Cardiovascular:  Negative for chest pain, palpitations and leg swelling.  Gastrointestinal:  Negative for abdominal pain.  Genitourinary:  Negative for dysuria.  Musculoskeletal:  Negative for arthralgias.  Neurological:  Negative for syncope, light-headedness and headaches.  Psychiatric/Behavioral:  Negative for dysphoric mood.    Objective:  BP (!) 150/90   Pulse 68   Temp 98 F (36.7 C) (Oral)   Ht '5\' 11"'$  (1.803 m)   Wt 236 lb 4 oz (107.2 kg)   SpO2 98%   BMI 32.95 kg/m   Wt Readings from Last 3 Encounters:  03/22/22 236 lb 4 oz (107.2 kg)  02/27/22 230 lb (104.3 kg)  01/18/22 239 lb 1 oz (108.4 kg)      Physical Exam Constitutional:      General: He is not in acute distress.    Appearance: Normal appearance. He is well-developed. He is not ill-appearing or toxic-appearing.  HENT:     Head: Normocephalic and atraumatic.     Right Ear: Hearing, tympanic membrane, ear canal and external ear normal.     Left Ear: Hearing, tympanic membrane, ear canal and external ear normal.     Nose: Nose normal.     Mouth/Throat:     Pharynx: Uvula midline.  Eyes:     General: Lids are normal. Lids are everted, no foreign bodies appreciated.     Conjunctiva/sclera: Conjunctivae normal.     Pupils: Pupils are equal, round, and reactive to light.  Neck:     Thyroid: No thyroid mass or  thyromegaly.     Vascular: No carotid bruit.     Trachea: Trachea and phonation normal.  Cardiovascular:     Rate and Rhythm: Normal rate and regular rhythm.     Pulses: Normal pulses.     Heart sounds: S1 normal and S2 normal. No murmur heard.    No gallop.  Pulmonary:     Breath sounds: Normal breath sounds. Decreased air movement present. No wheezing, rhonchi or rales.  Abdominal:     General: Bowel sounds are normal.     Palpations: Abdomen is soft.     Tenderness: There is no abdominal tenderness. There  is no guarding or rebound.     Hernia: No hernia is present.  Musculoskeletal:     Cervical back: Normal range of motion and neck supple.  Lymphadenopathy:     Cervical: No cervical adenopathy.  Skin:    General: Skin is warm and dry.     Findings: No rash.  Neurological:     Mental Status: He is alert.     Cranial Nerves: No cranial nerve deficit.     Sensory: No sensory deficit.     Gait: Gait normal.     Deep Tendon Reflexes: Reflexes are normal and symmetric.  Psychiatric:        Speech: Speech normal.        Behavior: Behavior normal.        Judgment: Judgment normal.       Diabetic foot exam: Normal inspection No skin breakdown No calluses  Normal DP pulses Normal sensation to light touch and monofilament Nails normal  Results for orders placed or performed in visit on 02/22/22  PSA  Result Value Ref Range   Prostate Specific Ag, Serum 0.5 0.0 - 4.0 ng/mL  Hematocrit  Result Value Ref Range   Hematocrit 44.8 37.5 - 51.0 %  Testosterone  Result Value Ref Range   Testosterone 753 264 - 916 ng/dL     COVID 19 screen:  No recent travel or known exposure to COVID19 The patient denies respiratory symptoms of COVID 19 at this time. The importance of social distancing was discussed today.   Assessment and Plan   The patient's preventative maintenance and recommended screening tests for an annual wellness exam were reviewed in full today. Brought up to  date unless services declined.  Counselled on the importance of diet, exercise, and its role in overall health and mortality. The patient's FH and SH was reviewed, including their home life, tobacco status, and drug and alcohol status.   Vaccines:uptodate, Tdap due,  Discussed COVID19 vaccine side effects and benefits. Strongly encouraged the patient to get the vaccine. Questions answered. Prostate Cancer Screen:stable PSA 10/25/2018  Lab Results  Component Value Date   PSA1 0.5 02/22/2022   PSA1 0.7 02/21/2021   PSA1 0.3 02/18/2020   PSA 0.49 07/14/2015   PSA 0.48 12/20/2013   PSA 0.34 11/24/2012  Colon Cancer Screen: 08/2015 colonoscopy.. repeat in 10 years      Smoking Status:nonsmoker.. dips ETOH/ drug TMH:DQQI  Hep C: neg  HIV screen:  neg Colon: 2016 repeat in 10 years  Problem List Items Addressed This Visit     Cardiomyopathy   Chronic bronchitis, unspecified chronic bronchitis type ([place])     Followed by pulmonary.      Controlled diabetes mellitus with circulatory complication  ( HTN) ([facility])     Diet controlled.      Relevant Orders   Lipid panel (Completed)   Comprehensive metabolic panel (Completed)   Hypertension associated with diabetes ([place])    Chronic, elevated in office today on lisinopril 40 mg daily.       Other Visit Diagnoses     Medicare annual wellness visit, subsequent    -  Primary   Other fatigue       Relevant Orders   TSH (Completed)   Vitamin B12 (Completed)   Vitamin D deficiency       Relevant Orders   VITAMIN D 25 Hydroxy (Vit-D Deficiency, Fractures) (Completed)   Need for vaccination against Streptococcus pneumoniae       Relevant Orders  Pneumococcal conjugate vaccine 20-valent (Prevnar 20) (Completed)        Eliezer Lofts, MD

## 2022-03-22 NOTE — Assessment & Plan Note (Signed)
Chronic, elevated in office today on lisinopril 40 mg daily.

## 2022-03-22 NOTE — Assessment & Plan Note (Signed)
Diet controlled.  

## 2022-05-03 ENCOUNTER — Other Ambulatory Visit: Payer: Self-pay | Admitting: Family Medicine

## 2022-05-20 ENCOUNTER — Ambulatory Visit (INDEPENDENT_AMBULATORY_CARE_PROVIDER_SITE_OTHER): Payer: Medicare Other | Admitting: Primary Care

## 2022-05-20 ENCOUNTER — Encounter: Payer: Self-pay | Admitting: Primary Care

## 2022-05-20 VITALS — BP 118/76 | HR 77 | Temp 98.1°F | Ht 71.0 in | Wt 235.0 lb

## 2022-05-20 DIAGNOSIS — J4550 Severe persistent asthma, uncomplicated: Secondary | ICD-10-CM

## 2022-05-20 DIAGNOSIS — G4733 Obstructive sleep apnea (adult) (pediatric): Secondary | ICD-10-CM | POA: Diagnosis not present

## 2022-05-20 DIAGNOSIS — R0602 Shortness of breath: Secondary | ICD-10-CM

## 2022-05-20 MED ORDER — TRELEGY ELLIPTA 200-62.5-25 MCG/ACT IN AEPB
1.0000 | INHALATION_SPRAY | Freq: Every day | RESPIRATORY_TRACT | 5 refills | Status: DC
Start: 2022-05-20 — End: 2022-09-13

## 2022-05-20 MED ORDER — PREDNISONE 10 MG PO TABS: ORAL_TABLET | ORAL | 0 refills | Status: DC

## 2022-05-20 MED ORDER — TRELEGY ELLIPTA 200-62.5-25 MCG/ACT IN AEPB: 1.0000 | INHALATION_SPRAY | Freq: Every day | RESPIRATORY_TRACT | 0 refills | Status: DC

## 2022-05-20 NOTE — Assessment & Plan Note (Signed)
-   Hx moderate-severe OSA. Last sleep study was in September 2018 with [facility].  No longer using CPAP but has machine at home. Reports increased fatigue symptoms. Strongly advised patient that he needs to resume use. We will place an order for CPAP supplies to be renewed with [employer]. Encourage patient wear CPAP every night for min 4-6 hours or longer and with naps. Advised against driving if experiencing excessive daytime sleepiness. FU in 1-2 months or sooner if needed. May need repeat sleep study.

## 2022-05-20 NOTE — Patient Instructions (Addendum)
Recommendations: - Increasing Trelegy to 200 mcg 1 puff daily (sending in new prescription) - Take prednisone taper as prescribed - It is VERY important that you start wearing CPAP again, untreated sleep apnea is contributing to fatigue and shortness of breath. Once you receive new CPAP supplies please aim to wear CPAP every night for minimum 4 to 6 hours or longer (including naps). Call is if you have any issues.   Orders: - PFTs in 1-2 months  - Renew CPAP supplies with [employer]   Follow-up: - 6-8 weeks with Dr. Mortimer Fries or Eustaquio Maize NP   CPAP and BIPAP Information CPAP and BIPAP are methods that use air pressure to keep your airways open and to help you breathe well. CPAP and BIPAP use different amounts of pressure. Your health care provider will tell you whether CPAP or BIPAP would be more helpful for you. CPAP stands for "continuous positive airway pressure." With CPAP, the amount of pressure stays the same while you breathe in (inhale) and out (exhale). BIPAP stands for "bi-level positive airway pressure." With BIPAP, the amount of pressure will be higher when you inhale and lower when you exhale. This allows you to take larger breaths. CPAP or BIPAP may be used in the hospital, or your health care provider may want you to use it at home. You may need to have a sleep study before your health care provider can order a machine for you to use at home. What are the advantages? CPAP or BIPAP can be helpful if you have: Sleep apnea. Chronic obstructive pulmonary disease (COPD). Heart failure. Medical conditions that cause muscle weakness, including muscular dystrophy or amyotrophic lateral sclerosis (ALS). Other problems that cause breathing to be shallow, weak, abnormal, or difficult. CPAP and BIPAP are most commonly used for obstructive sleep apnea (OSA) to keep the airways from collapsing when the muscles relax during sleep. What are the risks? Generally, this is a safe treatment. However, problems  may occur, including: Irritated skin or skin sores if the mask does not fit properly. Dry or stuffy nose or nosebleeds. Dry mouth. Feeling gassy or bloated. Sinus or lung infection if the equipment is not cleaned properly. When should CPAP or BIPAP be used? In most cases, the mask only needs to be worn during sleep. Generally, the mask needs to be worn throughout the night and during any daytime naps. People with certain medical conditions may also need to wear the mask at other times, such as when they are awake. Follow instructions from your health care provider about when to use the machine. What happens during CPAP or BIPAP?  Both CPAP and BIPAP are provided by a small machine with a flexible plastic tube that attaches to a plastic mask that you wear. Air is blown through the mask into your nose or mouth. The amount of pressure that is used to blow the air can be adjusted on the machine. Your health care provider will set the pressure setting and help you find the best mask for you. Tips for using the mask Because the mask needs to be snug, some people feel trapped or closed-in (claustrophobic) when first using the mask. If you feel this way, you may need to get used to the mask. One way to do this is to hold the mask loosely over your nose or mouth and then gradually apply the mask more snugly. You can also gradually increase the amount of time that you use the mask. Masks are available in various types  and sizes. If your mask does not fit well, talk with your health care provider about getting a different one. Some common types of masks include: Full face masks, which fit over the mouth and nose. Nasal masks, which fit over the nose. Nasal pillow or prong masks, which fit into the nostrils. If you are using a mask that fits over your nose and you tend to breathe through your mouth, a chin strap may be applied to help keep your mouth closed. Use a skin barrier to protect your skin as told by  your health care provider. Some CPAP and BIPAP machines have alarms that may sound if the mask comes off or develops a leak. If you have trouble with the mask, it is very important that you talk with your health care provider about finding a way to make the mask easier to tolerate. Do not stop using the mask. There could be a negative impact on your health if you stop using the mask. Tips for using the machine Place your CPAP or BIPAP machine on a secure table or stand near an electrical outlet. Know where the on/off switch is on the machine. Follow instructions from your health care provider about how to set the pressure on your machine and when you should use it. Do not eat or drink while the CPAP or BIPAP machine is on. Food or fluids could get pushed into your lungs by the pressure of the CPAP or BIPAP. For home use, CPAP and BIPAP machines can be rented or purchased through home health care companies. Many different brands of machines are available. Renting a machine before purchasing may help you find out which particular machine works well for you. Your health insurance company may also decide which machine you may get. Keep the CPAP or BIPAP machine and attachments clean. Ask your health care provider for specific instructions. Check the humidifier if you have a dry stuffy nose or nosebleeds. Make sure it is working correctly. Follow these instructions at home: Take over-the-counter and prescription medicines only as told by your health care provider. Ask if you can take sinus medicine if your sinuses are blocked. Do not use any products that contain nicotine or tobacco. These products include cigarettes, chewing tobacco, and vaping devices, such as e-cigarettes. If you need help quitting, ask your health care provider. Keep all follow-up visits. This is important. Contact a health care provider if: You have redness or pressure sores on your head, face, mouth, or nose from the mask or head  gear. You have trouble using the CPAP or BIPAP machine. You cannot tolerate wearing the CPAP or BIPAP mask. Someone tells you that you snore even when wearing your CPAP or BIPAP. Get help right away if: You have trouble breathing. You feel confused. Summary CPAP and BIPAP are methods that use air pressure to keep your airways open and to help you breathe well. If you have trouble with the mask, it is very important that you talk with your health care provider about finding a way to make the mask easier to tolerate. Do not stop using the mask. There could be a negative impact to your health if you stop using the mask. Follow instructions from your health care provider about when to use the machine. This information is not intended to replace advice given to you by your health care provider. Make sure you discuss any questions you have with your health care provider. Document Revised: 05/16/2021 Document Reviewed: 09/15/2020 Elsevier Patient  Education  [place].

## 2022-05-20 NOTE — Assessment & Plan Note (Addendum)
-   Former smoker. Hx obstructive asthma. Last seen 1.5 years ago, patient reports increased dyspnea since then with associated intermittent wheezing and dry cough. Using SABA upwards for 3-4 times a day. Currently on Trelegy 134mg daily. Plan increase Trelegy 2067m and sending in prednisone taper for acute exacerbation. Needs repeat pulmonary function testing. FU in 1-2 months or sooner if needed.

## 2022-05-20 NOTE — Progress Notes (Signed)
$'@Patient'E$  ID: Cody Day, male    DOB: June 18, 1956, 66 y.o.   MRN: [id number]  Chief Complaint  Patient presents with   Follow-up    Not wearing cpap. SOB with exertion and prod cough with yellow to green sputum.     Referring provider: [name], MD  HPI: 66 year old male, former smoker quit in 1997 (35 pack year hx). PMH significant for COPD, chronic asthma, allergic rhinitis, OSA (non-compliant with CPAP), nocturnal hypoxia, cardiomyopathy, CAD, HTN, polysubstance abuse, prediabetes, HLD. Former patient of Dr. Pennie Banter, now following with Dr. Mortimer Fries. Previously on [employer] and Flovent hfa. Now on Trelegy Ellipta. Needs repeat PFTs and sleep eval in [facility].   Sleep study on 12/22/2011-sleep study-AHI 79, SaO2 low 65%.  Simple spirometry 03/21/2016 no significant airflow obstruction, FEV1 2.47 L (64% predicted).   05/20/2022- Interim hx  Patient presents today for overdue follow-up.  He was last seen back in February 2021 for COPD exacerbation. He is more fatigued recently, states that he is tired all the times. He gets out of breath walking or while speaking for an extended period of time. Associated wheezing and cough. He is using Trelegy 143mg as prescribed. He uses albuterol HFA 3-4 times a day which does help. He has a history of sleep apnea, not currently on CPAP. [place] [facility] moderate [facility]SA, AHI 20.7/hr with SpO2 low 87%. His machine is in working order but he does not have supplies. He is unsure what DME company he uses. He struggles some when wearing CPAP, states that he always comes off. He uses full face mask, interested in nasal mask. His wife sleeps better when he wears his CPAP.    Allergies  Allergen Reactions   Levaquin [Levofloxacin In D5w]     Severe headaches, skin tingling/redness   Penicillins Hives and Rash    Has patient had a PCN reaction causing immediate  rash, facial/tongue/throat swelling, SOB or lightheadedness with hypotension: Yes Has patient had a PCN reaction causing severe rash involving mucus membranes or skin necrosis: Unknown Has patient had a PCN reaction that required hospitalization: No Has patient had a PCN reaction occurring within the last 10 years: No - Childhood reaction If all of the above answers are "NO", then may proceed with Cephalosporin use.     Immunization History  Administered Date(s) Administered   Influenza Whole 11/21/2012   Influenza,inj,Quad PF,6+ Mos 09/06/2014, 07/18/2015, 07/29/2016, 09/19/2017, 07/21/2018, 07/29/2019, 08/29/2020   PFIZER(Purple Top)SARS-COV-2 Vaccination 04/01/2020, 04/24/2020   PNEUMOCOCCAL CONJUGATE-20 03/22/2022   Pneumococcal Polysaccharide-23 10/23/2012   Td 10/22/2003, 10/21/2009    Past Medical History:  Diagnosis Date   Achilles tendon rupture 10/11   and repair   Adenomatous polyp of colon 04/06/2010   Adenomatous polyps 3 in the descending colon, times one in the ascending colon ([facility]   Asthma    COPD (chronic obstructive pulmonary disease) ([facility]    Gout    Hemorrhoids    Hypercholesteremia    Hypertension    Sleep apnea    doesn't wear CPAP   Syncope 10/11   in settin gof asthma exacerbation w coughing. Ech (10/11): EF > 55%, mild LVH, grade I diastolic dysfunction, nomral RV size and systolic function,. normal valves. Carotid [place](10/11): minimal disease    Tobacco History: Social History   Tobacco Use  Smoking Status Former   Packs/day: 0.25   Years: 35.00  Total pack years: 8.75   Types: Cigarettes   Quit date: 10/22/1995   Years since quitting: 26.5  Smokeless Tobacco Current   Types: Chew  Tobacco Comments   rarely smoked cigarettes and cigars.  states he smoked drugs mostly   Ready to quit: Not Answered Counseling given: Not Answered Tobacco comments: rarely smoked cigarettes and cigars.  states he smoked drugs mostly   Outpatient  Medications Prior to Visit  Medication Sig Dispense Refill   citalopram (CELEXA) 40 MG tablet TAKE 1 TABLET BY MOUTH DAILY 90 tablet 1   lisinopril (ZESTRIL) 40 MG tablet Take 1 tablet (40 mg total) by mouth daily. 90 tablet 3   montelukast (SINGULAIR) 10 MG tablet TAKE 1 TABLET BY MOUTH DAILY 90 tablet 3   Multiple Vitamin (MULTIVITAMIN WITH MINERALS) TABS tablet Take 1 tablet by mouth daily. 30 tablet 2   simvastatin (ZOCOR) 20 MG tablet TAKE 1 TABLET BY MOUTH DAILY 90 tablet 3   testosterone cypionate (DEPOTESTOSTERONE CYPIONATE) 200 MG/ML injection INJECT 0.3 ML INTO THE MUSCLE ONCE A WEEK 5 mL 0   VENTOLIN HFA 108 (90 Base) MCG/ACT inhaler INHALE 2 PUFFS INTO THE LUNGS EVERY 6 HOURS AS NEEDED FOR WHEEZING OR SHORTNESS OF BREATH 18 g 2   TRELEGY ELLIPTA 100-62.5-25 MCG/ACT AEPB INHALE 1 PUFF INTO THE LUNGS DAILY 60 each 2   allopurinol (ZYLOPRIM) 300 MG tablet TAKE 1 TABLET BY MOUTH DAILY (Patient not taking: Reported on 05/20/2022) 90 tablet 3   No facility-administered medications prior to visit.    Review of Systems  Review of Systems  Constitutional:  Positive for fatigue.  HENT: Negative.    Respiratory:  Positive for cough, shortness of breath and wheezing.   Cardiovascular: Negative.    Physical Exam  BP 118/76 (BP Location: Left Arm, Cuff Size: Normal)   Pulse 77   Temp 98.1 F (36.7 C) (Temporal)   Ht '5\' 11"'$  (1.803 m)   Wt 235 lb (106.6 kg)   SpO2 98%   BMI 32.78 kg/m  Physical Exam Constitutional:      Appearance: Normal appearance.  HENT:     Head: Normocephalic and atraumatic.     Mouth/Throat:     Mouth: Mucous membranes are moist.     Pharynx: Oropharynx is clear.  Cardiovascular:     Rate and Rhythm: Normal rate and regular rhythm.  Pulmonary:     Effort: Pulmonary effort is normal.     Breath sounds: Wheezing present.  Musculoskeletal:        General: Normal range of motion.     Cervical back: Normal range of motion.  Skin:    General: Skin is  warm and dry.  Neurological:     General: No focal deficit present.     Mental Status: He is alert and oriented to person, place, and time. Mental status is at baseline.  Psychiatric:        Mood and Affect: Mood normal.        Behavior: Behavior normal.        Thought Content: Thought content normal.        Judgment: Judgment normal.      Lab Results:  CBC    Component Value Date/Time   WBC 8.6 02/01/2021 1814   RBC 5.93 (H) 02/01/2021 1814   HGB 13.9 02/01/2021 1814   HGB 13.5 10/23/2012 1346   HCT 44.8 02/22/2022 0805   PLT 323 02/01/2021 1814   PLT 257 10/23/2012 1346   MCV  73.2 (L) 02/01/2021 1814   MCV 72 (L) 10/23/2012 1346   MCH 23.4 (L) 02/01/2021 1814   MCHC 32.0 02/01/2021 1814   RDW 14.7 02/01/2021 1814   RDW 17.2 (H) 10/23/2012 1346   LYMPHSABS 2.9 02/01/2021 1814   MONOABS 0.7 02/01/2021 1814   EOSABS 0.2 02/01/2021 1814   BASOSABS 0.1 02/01/2021 1814    BMET    Component Value Date/Time   NA 138 03/22/2022 1040   NA 136 10/23/2012 1346   K 4.4 03/22/2022 1040   K 3.4 (L) 10/23/2012 1346   CL 100 03/22/2022 1040   CL 99 10/23/2012 1346   CO2 30 03/22/2022 1040   CO2 23 10/23/2012 1346   GLUCOSE 92 03/22/2022 1040   GLUCOSE 112 (H) 10/23/2012 1346   BUN 9 03/22/2022 1040   BUN 13 10/23/2012 1346   CREATININE 0.71 03/22/2022 1040   CREATININE 1.07 10/23/2012 1346   CALCIUM 9.8 03/22/2022 1040   CALCIUM 9.3 10/23/2012 1346   GFRNONAA >60 02/01/2021 1814   GFRNONAA >60 10/23/2012 1346   GFRAA >60 11/03/2019 1200   GFRAA >60 10/23/2012 1346    BNP    Component Value Date/Time   BNP 34.0 10/04/2019 1137    ProBNP No results found for: "PROBNP"  Imaging: No results found.   Assessment & Plan:   Chronic asthma - Former smoker. Hx obstructive asthma. Last seen 1.5 years ago, patient reports increased dyspnea since then with associated intermittent wheezing and dry cough. Using SABA upwards for 3-4 times a day. Currently on Trelegy  156mg daily. Plan increase Trelegy 2043m and sending in prednisone taper for acute exacerbation. Needs repeat pulmonary function testing. FU in 1-2 months or sooner if needed.   OSA (obstructive sleep apnea) - Hx moderate-severe OSA. Last sleep study was in September 2018 with [facility].  No longer using CPAP but has machine at home. Reports increased fatigue symptoms. Strongly advised patient that he needs to resume use. We will place an order for CPAP supplies to be renewed with [employer]. Encourage patient wear CPAP every night for min 4-6 hours or longer and with naps. Advised against driving if experiencing excessive daytime sleepiness. FU in 1-2 months or sooner if needed. May need repeat sleep study.   40 mins spent on case: >50% face to face with patient   ElMartyn EhrichNP 05/20/2022

## 2022-05-30 ENCOUNTER — Other Ambulatory Visit: Payer: Self-pay | Admitting: Family Medicine

## 2022-05-31 ENCOUNTER — Other Ambulatory Visit: Payer: Self-pay | Admitting: Family Medicine

## 2022-05-31 DIAGNOSIS — E291 Testicular hypofunction: Secondary | ICD-10-CM

## 2022-06-03 MED ORDER — TESTOSTERONE CYPIONATE 200 MG/ML IM SOLN: INTRAMUSCULAR | 0 refills | Status: DC

## 2022-06-06 ENCOUNTER — Telehealth: Payer: Self-pay | Admitting: Family Medicine

## 2022-06-06 NOTE — Telephone Encounter (Signed)
Patient notified Testosterone cypionate has been approved Case# [id number] Approval dates: 05/07/2022-06/06/2023

## 2022-06-10 ENCOUNTER — Telehealth: Payer: Self-pay | Admitting: Family Medicine

## 2022-06-10 NOTE — Telephone Encounter (Signed)
Error

## 2022-08-15 ENCOUNTER — Ambulatory Visit: Payer: Medicare Other | Attending: Primary Care

## 2022-08-15 DIAGNOSIS — J45909 Unspecified asthma, uncomplicated: Secondary | ICD-10-CM | POA: Diagnosis not present

## 2022-08-15 DIAGNOSIS — R0602 Shortness of breath: Secondary | ICD-10-CM | POA: Diagnosis not present

## 2022-08-15 DIAGNOSIS — Z87891 Personal history of nicotine dependence: Secondary | ICD-10-CM | POA: Insufficient documentation

## 2022-08-15 DIAGNOSIS — R7303 Prediabetes: Secondary | ICD-10-CM | POA: Insufficient documentation

## 2022-08-15 DIAGNOSIS — G4733 Obstructive sleep apnea (adult) (pediatric): Secondary | ICD-10-CM | POA: Diagnosis not present

## 2022-08-15 DIAGNOSIS — I2510 Atherosclerotic heart disease of native coronary artery without angina pectoris: Secondary | ICD-10-CM | POA: Diagnosis not present

## 2022-08-15 DIAGNOSIS — E785 Hyperlipidemia, unspecified: Secondary | ICD-10-CM | POA: Diagnosis not present

## 2022-08-15 DIAGNOSIS — J449 Chronic obstructive pulmonary disease, unspecified: Secondary | ICD-10-CM | POA: Diagnosis not present

## 2022-08-15 DIAGNOSIS — I10 Essential (primary) hypertension: Secondary | ICD-10-CM | POA: Diagnosis not present

## 2022-08-15 LAB — PULMONARY FUNCTION TEST ARMC ONLY
DL/VA % pred: 96 %
DL/VA: 3.99 ml/min/mmHg/L
DLCO unc % pred: 98 %
DLCO unc: 27.09 ml/min/mmHg
FEF 25-75 Post: 3.17 L/sec
FEF 25-75 Pre: 2.2 L/sec
FEF2575-%Change-Post: 43 %
FEF2575-%Pred-Post: 112 %
FEF2575-%Pred-Pre: 78 %
FEV1-%Change-Post: 9 %
FEV1-%Pred-Post: 88 %
FEV1-%Pred-Pre: 81 %
FEV1-Post: 3.17 L
FEV1-Pre: 2.89 L
FEV1FVC-%Change-Post: 0 %
FEV1FVC-%Pred-Pre: 99 %
FEV6-%Change-Post: 7 %
FEV6-%Pred-Post: 91 %
FEV6-%Pred-Pre: 85 %
FEV6-Post: 4.16 L
FEV6-Pre: 3.87 L
FEV6FVC-%Change-Post: 0 %
FEV6FVC-%Pred-Post: 104 %
FEV6FVC-%Pred-Pre: 105 %
FVC-%Change-Post: 9 %
FVC-%Pred-Post: 88 %
FVC-%Pred-Pre: 81 %
FVC-Post: 4.23 L
FVC-Pre: 3.87 L
Post FEV1/FVC ratio: 75 %
Post FEV6/FVC ratio: 99 %
Pre FEV1/FVC ratio: 75 %
Pre FEV6/FVC Ratio: 100 %
RV % pred: 108 %
RV: 2.61 L
TLC % pred: 97 %
TLC: 7.04 L

## 2022-08-19 ENCOUNTER — Encounter (INDEPENDENT_AMBULATORY_CARE_PROVIDER_SITE_OTHER): Payer: Self-pay

## 2022-08-20 ENCOUNTER — Ambulatory Visit (INDEPENDENT_AMBULATORY_CARE_PROVIDER_SITE_OTHER): Payer: Medicare Other | Admitting: Primary Care

## 2022-08-20 ENCOUNTER — Encounter: Payer: Self-pay | Admitting: Primary Care

## 2022-08-20 VITALS — BP 124/78 | HR 75 | Temp 98.3°F | Ht 71.0 in | Wt 249.0 lb

## 2022-08-20 DIAGNOSIS — R251 Tremor, unspecified: Secondary | ICD-10-CM

## 2022-08-20 DIAGNOSIS — J4550 Severe persistent asthma, uncomplicated: Secondary | ICD-10-CM | POA: Diagnosis not present

## 2022-08-20 DIAGNOSIS — G250 Essential tremor: Secondary | ICD-10-CM

## 2022-08-20 DIAGNOSIS — G4733 Obstructive sleep apnea (adult) (pediatric): Secondary | ICD-10-CM

## 2022-08-20 DIAGNOSIS — R0600 Dyspnea, unspecified: Secondary | ICD-10-CM

## 2022-08-20 DIAGNOSIS — R0602 Shortness of breath: Secondary | ICD-10-CM

## 2022-08-20 MED ORDER — ALBUTEROL SULFATE (2.5 MG/3ML) 0.083% IN NEBU: 2.5000 mg | INHALATION_SOLUTION | Freq: Four times a day (QID) | RESPIRATORY_TRACT | 3 refills | Status: DC | PRN

## 2022-08-20 MED ORDER — ALBUTEROL SULFATE (2.5 MG/3ML) 0.083% IN NEBU
2.5000 mg | INHALATION_SOLUTION | Freq: Four times a day (QID) | RESPIRATORY_TRACT | 3 refills | Status: DC | PRN
Start: 2022-08-20 — End: 2023-03-26

## 2022-08-20 NOTE — Assessment & Plan Note (Signed)
-   Tremor is worse and affecting his walking with shuffle gait  - Referring to neurology

## 2022-08-20 NOTE — Assessment & Plan Note (Signed)
-   Referring to cardiology to evaluate dyspnea, PFTs are stable when compared to 2017. Asthma does not appear exacerbated.

## 2022-08-20 NOTE — Assessment & Plan Note (Addendum)
-   Former smoker. Patient reports dyspnea symptoms with minimal exertion.  Pulmonary function testing in October 2023 is stable when compared to 2017.  Continue Trelegy 200 mcg 1 puff daily and as needed albuterol HFA/nebulizer every 4 to 6 hours for breakthrough shortness of breath or wheezing.

## 2022-08-20 NOTE — Patient Instructions (Addendum)
Recommendations Continue Trelegy 230mg- take 1 puff daily Use albuterol every 4-6 hours as needed for breakthrough shortness of breath or wheezing  Orders Split-night sleep study re: OSA   Referral: Neurologist re: tremors/shuffle gait  Cardiology re: dyspnea   Follow-up: Has to follow up next with Dr. KMortimer Friesin January

## 2022-08-20 NOTE — Assessment & Plan Note (Signed)
-   History of severe sleep apnea, NPSG in 2013>> AHIO 79/hour. He has been noncompliant with CPAP use for some time now.  He tells me CPAP pressure was too strong.  Continues to have symptoms of snoring.  He denies overt apnea.  Recommend getting split-night sleep study in lab.

## 2022-08-20 NOTE — Addendum Note (Signed)
Addended by: Claudette Head A on: 08/20/2022 02:01 PM   Modules accepted: Orders

## 2022-08-20 NOTE — Progress Notes (Signed)
$'@Patient'M$  ID: Cody Day, male    DOB: 1956-01-31, 66 y.o.   MRN: [id number]  Chief Complaint  Patient presents with   Follow-up    Not wearing cpap. C/o SOB with exertion and occ dry cough    Referring provider: [name], MD  HPI: 66 year old male, former smoker quit in 1997 (35 pack year hx). PMH significant for COPD, chronic asthma, allergic rhinitis, OSA (non-compliant with CPAP), nocturnal hypoxia, cardiomyopathy, CAD, HTN, polysubstance abuse, prediabetes, HLD. Former patient of Dr. Pennie Banter, now following with Dr. Mortimer Fries. Previously on [employer] and Flovent hfa. Now on Trelegy Ellipta. Needs repeat PFTs and sleep eval in [place] [facility].   Sleep study on 12/22/2011-sleep study-AHI 79, SaO2 low 65%.  Simple spirometry 03/21/2016 no significant airflow obstruction, FEV1 2.47 L (64% predicted).   Previous [facility] pulmonary encounter: 05/20/2022 Patient presents today for overdue follow-up.  He was last seen back in February 2021 for COPD exacerbation. He is more fatigued recently, states that he is tired all the times. He gets out of breath walking or while speaking for an extended period of time. Associated wheezing and cough. He is using Trelegy 151mg as prescribed. He uses albuterol HFA 3-4 times a day which does help. He has a history of sleep apnea, not currently on CPAP. He had sleep [place] ago. Sleep study in 06/27/2017 at sleep [facility] showed moderate OSA, AHI 20.7/hr with SpO2 low 87%. His machine is in working order but he does not have supplies. He is unsure what DME company he uses. He struggles some when wearing CPAP, states that he always comes off. He uses full face mask, interested in nasal mask. His wife sleeps better when he wears his CPAP.    08/20/2022 - Interim hx  Patient presents today for 3 month follow-up asthma/OSA.   Patient is a former smoker with a history of obstructive asthma.  During his last office visit he reported  increased dyspnea symptoms with associated wheezing and dry cough.  He was using Saba upwards of 3-4 times a day.  We increased patient's Trelegy to 200 mcg 1 puff daily and treated him with a prednisone taper  Last sleep study was in September 2018 with sleep [facility].  During her last office visit he reported increased fatigue symptoms.  We strongly encouraged he resume CPAP use.  We placed an order for him to receive new CPAP supplies with [employer].  His breathing is about the same. He runs out of breath easily with exertion. Pulmonary function testing in October 2023 is stable when compared to 2017.   He has no trouble sleeping at night. He wakes up once at night to use the restroom. He snores. He does not report symptoms of apnea. Formerly non-compliant with CPAP. He tells me that the pressure was too strong. He used to be on nocturnal oxygen but no longer uses this. DME is [employer].   Tremor is worse and now affecting his walking   Allergies  Allergen Reactions   Levaquin [Levofloxacin In D5w]     Severe headaches, skin tingling/redness   Penicillins Hives and Rash    Has patient had a PCN reaction causing immediate rash, facial/tongue/throat swelling, SOB or lightheadedness with hypotension: Yes Has patient had a PCN reaction causing severe rash involving mucus membranes or skin necrosis: Unknown Has patient had a PCN reaction that required hospitalization: No Has patient had a PCN reaction occurring within the last 10 years:  No - Childhood reaction If all of the above answers are "NO", then may proceed with Cephalosporin use.     Immunization History  Administered Date(s) Administered   Influenza Whole 11/21/2012   Influenza,inj,Quad PF,6+ Mos 09/06/2014, 07/18/2015, 07/29/2016, 09/19/2017, 07/21/2018, 07/29/2019, 08/29/2020   PFIZER(Purple Top)SARS-COV-2 Vaccination 04/01/2020, 04/24/2020   PNEUMOCOCCAL CONJUGATE-20 03/22/2022   Pneumococcal Polysaccharide-23 10/23/2012   Td  10/22/2003, 10/21/2009    Past Medical History:  Diagnosis Date   Achilles tendon rupture 10/11   and repair   Adenomatous polyp of colon 04/06/2010   Adenomatous polyps 3 in the descending colon, times one in the ascending colon [facility])   Asthma    COPD (chronic obstructive pulmonary disease) ([place])    Gout    Hemorrhoids    Hypercholesteremia    Hypertension    Sleep apnea    doesn't wear CPAP   Syncope 10/11   in settin gof asthma exacerbation w coughing. Ech (10/11): EF > 55%, mild LVH, grade I diastolic dysfunction, nomral RV size and systolic function,. normal valves. Carotid US (10/11): minimal disease    Tobacco History: Social History   Tobacco Use  Smoking Status Former   Packs/day: 0.25   Years: 35.00   Total pack years: 8.75   Types: Cigarettes   Quit date: 10/22/1995   Years since quitting: 26.8  Smokeless Tobacco Current   Types: Chew  Tobacco Comments   rarely smoked cigarettes and cigars.  states he smoked drugs mostly   Ready to quit: Not Answered Counseling given: Not Answered Tobacco comments: rarely smoked cigarettes and cigars.  states he smoked drugs mostly   Outpatient Medications Prior to Visit  Medication Sig Dispense Refill   allopurinol (ZYLOPRIM) 300 MG tablet TAKE 1 TABLET BY MOUTH DAILY 90 tablet 3   citalopram (CELEXA) 40 MG tablet TAKE 1 TABLET BY MOUTH DAILY 90 tablet 1   Fluticasone-Umeclidin-Vilant (TRELEGY ELLIPTA) 200-62.5-25 MCG/ACT AEPB Inhale 1 puff into the lungs daily. 60 each 5   lisinopril (ZESTRIL) 40 MG tablet Take 1 tablet (40 mg total) by mouth daily. 90 tablet 3   montelukast (SINGULAIR) 10 MG tablet TAKE 1 TABLET BY MOUTH DAILY 90 tablet 3   Multiple Vitamin (MULTIVITAMIN WITH MINERALS) TABS tablet Take 1 tablet by mouth daily. 30 tablet 2   simvastatin (ZOCOR) 20 MG tablet TAKE 1 TABLET BY MOUTH DAILY 90 tablet 3   testosterone cypionate (DEPOTESTOSTERONE CYPIONATE) 200 MG/ML injection Inject. 0.38m in muscle  1 time weekly. 5 mL 0   VENTOLIN HFA 108 (90 Base) MCG/ACT inhaler INHALE 2 PUFFS INTO THE LUNGS EVERY 6 HOURS AS NEEDED FOR WHEEZING OR SHORTNESS OF BREATH 18 g 2   albuterol (PROVENTIL) (2.5 MG/3ML) 0.083% nebulizer solution Take 2.5 mg by nebulization every 6 (six) hours as needed for wheezing or shortness of breath.     Fluticasone-Umeclidin-Vilant (TRELEGY ELLIPTA) 200-62.5-25 MCG/ACT AEPB Inhale 1 puff into the lungs daily. 14 each 0   predniSONE (DELTASONE) 10 MG tablet 4 tabs for 2 days, then 3 tabs for 2 days, 2 tabs for 2 days, then 1 tab for 2 days, then stop 20 tablet 0   No facility-administered medications prior to visit.    Review of Systems  Review of Systems  Constitutional: Negative.   HENT: Negative.    Respiratory:  Positive for shortness of breath. Negative for cough, chest tightness and wheezing.   Cardiovascular: Negative.   Musculoskeletal:  Positive for gait problem.  Neurological:  Positive for tremors and weakness.  Physical Exam  BP 124/78 (BP Location: Left Arm, Cuff Size: Normal)   Pulse 75   Temp 98.3 F (36.8 C) (Temporal)   Ht '5\' 11"'$  (1.803 m)   Wt 249 lb (112.9 kg)   SpO2 100%   BMI 34.73 kg/m  Physical Exam Constitutional:      General: He is not in acute distress.    Appearance: Normal appearance. He is not ill-appearing.  HENT:     Head: Normocephalic and atraumatic.     Mouth/Throat:     Mouth: Mucous membranes are moist.     Pharynx: Oropharynx is clear.  Cardiovascular:     Rate and Rhythm: Normal rate and regular rhythm.  Pulmonary:     Effort: Pulmonary effort is normal.     Breath sounds: Normal breath sounds. No wheezing, rhonchi or rales.  Musculoskeletal:     Comments: Shuffle gait  Skin:    General: Skin is warm and dry.  Neurological:     General: No focal deficit present.     Mental Status: He is alert and oriented to person, place, and time. Mental status is at baseline.     Comments: BUE Tremor   Psychiatric:         Mood and Affect: Mood normal.        Behavior: Behavior normal.        Thought Content: Thought content normal.        Judgment: Judgment normal.      Lab Results:  CBC    Component Value Date/Time   WBC 8.6 02/01/2021 1814   RBC 5.93 (H) 02/01/2021 1814   HGB 13.9 02/01/2021 1814   HGB 13.5 10/23/2012 1346   HCT 44.8 02/22/2022 0805   PLT 323 02/01/2021 1814   PLT 257 10/23/2012 1346   MCV 73.2 (L) 02/01/2021 1814   MCV 72 (L) 10/23/2012 1346   MCH 23.4 (L) 02/01/2021 1814   MCHC 32.0 02/01/2021 1814   RDW 14.7 02/01/2021 1814   RDW 17.2 (H) 10/23/2012 1346   LYMPHSABS 2.9 02/01/2021 1814   MONOABS 0.7 02/01/2021 1814   EOSABS 0.2 02/01/2021 1814   BASOSABS 0.1 02/01/2021 1814    BMET    Component Value Date/Time   NA 138 03/22/2022 1040   NA 136 10/23/2012 1346   K 4.4 03/22/2022 1040   K 3.4 (L) 10/23/2012 1346   CL 100 03/22/2022 1040   CL 99 10/23/2012 1346   CO2 30 03/22/2022 1040   CO2 23 10/23/2012 1346   GLUCOSE 92 03/22/2022 1040   GLUCOSE 112 (H) 10/23/2012 1346   BUN 9 03/22/2022 1040   BUN 13 10/23/2012 1346   CREATININE 0.71 03/22/2022 1040   CREATININE 1.07 10/23/2012 1346   CALCIUM 9.8 03/22/2022 1040   CALCIUM 9.3 10/23/2012 1346   GFRNONAA >60 02/01/2021 1814   GFRNONAA >60 10/23/2012 1346   GFRAA >60 11/03/2019 1200   GFRAA >60 10/23/2012 1346    BNP    Component Value Date/Time   BNP 34.0 10/04/2019 1137    ProBNP No results found for: "PROBNP"  Imaging: No results found.   Assessment & Plan:   OSA (obstructive sleep apnea) - History of severe sleep apnea, NPSG in 2013>> AHIO 79/hour. He has been noncompliant with CPAP use for some time now.  He tells me CPAP pressure was too strong.  Continues to have symptoms of snoring.  He denies overt apnea.  Recommend getting split-night sleep study in lab.   Chronic  asthma - Former smoker. Patient reports dyspnea symptoms with minimal exertion.  Pulmonary function testing in  October 2023 is stable when compared to 2017.  Continue Trelegy 200 mcg 1 puff daily and as needed albuterol HFA/nebulizer every 4 to 6 hours for breakthrough shortness of breath or wheezing.    Benign essential tremor - Tremor is worse and affecting his walking with shuffle gait  - Referring to neurology   Dyspnea - Referring to cardiology to evaluate dyspnea, PFTs are stable when compared to 2017. Asthma does not appear exacerbated.    Martyn Ehrich, NP 08/20/2022

## 2022-08-27 ENCOUNTER — Telehealth: Payer: Self-pay | Admitting: Primary Care

## 2022-08-27 ENCOUNTER — Other Ambulatory Visit (HOSPITAL_COMMUNITY): Payer: Self-pay

## 2022-08-27 NOTE — Telephone Encounter (Signed)
Patient is calling to states that his trelegy inhaler is too expensive with his insurance. Can we run a ticket to see what is covered?  Thank you

## 2022-08-27 NOTE — Telephone Encounter (Signed)
Called and spoke to [employer] and discovered what was   Symbicort ($110/1 month) Dulera ($97.13/1 month) Advair ($110/ 1 month)  Please advise Beth on what you would like to send in since Trelegy 200 is costing his almost $436 a month  Thank you

## 2022-08-27 NOTE — Telephone Encounter (Signed)
Per further investigation, insurance will not allow [place] to run test claims to alternatives as we are not a contracted pharmacy to fill his medications. Insurance may have to be called to discuss covered options.

## 2022-08-30 ENCOUNTER — Other Ambulatory Visit: Payer: Medicare Other

## 2022-08-30 DIAGNOSIS — Z125 Encounter for screening for malignant neoplasm of prostate: Secondary | ICD-10-CM | POA: Diagnosis not present

## 2022-08-30 DIAGNOSIS — E291 Testicular hypofunction: Secondary | ICD-10-CM

## 2022-08-31 LAB — TESTOSTERONE: Testosterone: 146 ng/dL — ABNORMAL LOW (ref 264–916)

## 2022-08-31 LAB — HEMATOCRIT: Hematocrit: 45.6 % (ref 37.5–51.0)

## 2022-09-02 ENCOUNTER — Telehealth: Payer: Self-pay | Admitting: *Deleted

## 2022-09-02 NOTE — Telephone Encounter (Signed)
-----   Message from Abbie Sons, MD sent at 09/01/2022  8:26 PM EST ----- T level low at 146- is he still taking testosterone?

## 2022-09-02 NOTE — Telephone Encounter (Signed)
Notified patient as instructed, Last injection was  08/17/2022.

## 2022-09-04 ENCOUNTER — Ambulatory Visit: Payer: Medicare Other

## 2022-09-05 ENCOUNTER — Ambulatory Visit: Payer: Medicare Other | Attending: Otolaryngology

## 2022-09-05 DIAGNOSIS — G4733 Obstructive sleep apnea (adult) (pediatric): Secondary | ICD-10-CM | POA: Insufficient documentation

## 2022-09-13 MED ORDER — MOMETASONE FURO-FORMOTEROL FUM 200-5 MCG/ACT IN AERO: 2.0000 | INHALATION_SPRAY | Freq: Two times a day (BID) | RESPIRATORY_TRACT | 5 refills | Status: DC

## 2022-09-13 NOTE — Telephone Encounter (Signed)
Beth, can you please advise? Thanks!

## 2022-09-13 NOTE — Telephone Encounter (Signed)
ATC LVMTCB x 1  

## 2022-09-13 NOTE — Telephone Encounter (Signed)
I sent in RX Dulera 243mg two puffs twice daily If needed we can add Spiriva respimat 1.264m daily in the future

## 2022-09-17 NOTE — Telephone Encounter (Signed)
ATC LVMTCB x 2

## 2022-09-17 NOTE — Telephone Encounter (Signed)
Due to multiple attempts trying to reach pt and unable to do so, per protocol encounter will be closed.

## 2022-09-18 ENCOUNTER — Telehealth: Payer: Self-pay | Admitting: Primary Care

## 2022-09-18 DIAGNOSIS — G4733 Obstructive sleep apnea (adult) (pediatric): Secondary | ICD-10-CM

## 2022-09-18 NOTE — Telephone Encounter (Signed)
Cody Day, can you request a copy of sleep study from sleep center.

## 2022-09-18 NOTE — Telephone Encounter (Signed)
Has this sleep study been read by a sleep doc yet?

## 2022-09-18 NOTE — Telephone Encounter (Signed)
Called and spoke with pt and spouse to clarify his inhalers. Also while on the phone, pt wanted to know the results of recent sleep study. Beth, please advise.

## 2022-09-18 NOTE — Telephone Encounter (Signed)
Patient would like the nurse to call regarding his sleep study results.  He also has some questions about his medications.  Please call patient to discuss at [phone]

## 2022-09-18 NOTE — Telephone Encounter (Signed)
I just faxed a copy of the sleep study to [facility] fax for [facility]

## 2022-09-19 ENCOUNTER — Encounter: Payer: Self-pay | Admitting: Primary Care

## 2022-09-19 ENCOUNTER — Telehealth: Payer: Self-pay | Admitting: Primary Care

## 2022-09-19 NOTE — Telephone Encounter (Signed)
Split-night sleep study 09/05/2022 which showed evidence of severe obstructive sleep apnea, AHI 64.8 an hour with SpO2 low 68%.  CPAP was initiated at 8 cm H2O and titrated to final pressure 14 cm H2O with an AHI of 4.1.   If patient is okay starting CPAP please place an order for patient to receive CPAP machine at a pressure of 10 cm H2O with ResMed AirFit F20 size large mask and heated humidity.  He will need a follow-up 31 to 90 days after starting CPAP for compliance check

## 2022-09-19 NOTE — Telephone Encounter (Signed)
Called and spoke with patient, advised of results/recommendations per Geraldo Pitter NP,  he was agreeable to starting CPAP therapy.  Advised we would order it and the DME company would contact him when it is ready for pick up.  I requested that he call the office when he receives his CPAP machine to set up an OV for 31-90 days after starting CPAP to see how he is doing and it is an Facilities manager.  He verbalized understanding.  He requested that I sent the information to his mychart as well.  Advised I would do so.  Nothing further needed.

## 2022-09-19 NOTE — Telephone Encounter (Signed)
Sleep study received and given to [facility]

## 2022-09-20 NOTE — Telephone Encounter (Signed)
Printed and faxed split night sleep study to [place]. Informed Huey Romans that with new sleep study patietn has agreed to retry the cpap. New order was placed yesterday for new CPAP. Nothing further needed

## 2022-09-23 ENCOUNTER — Telehealth: Payer: Self-pay | Admitting: Family Medicine

## 2022-09-23 DIAGNOSIS — M1A9XX0 Chronic gout, unspecified, without tophus (tophi): Secondary | ICD-10-CM

## 2022-09-23 DIAGNOSIS — E1159 Type 2 diabetes mellitus with other circulatory complications: Secondary | ICD-10-CM

## 2022-09-23 DIAGNOSIS — E559 Vitamin D deficiency, unspecified: Secondary | ICD-10-CM

## 2022-09-23 DIAGNOSIS — E291 Testicular hypofunction: Secondary | ICD-10-CM

## 2022-09-23 NOTE — Telephone Encounter (Signed)
-----   Message from Velna Hatchet, RT sent at 09/09/2022 11:56 AM EST ----- Regarding: Tue 12/5/ lab Lab orders needed for appt on 09/24/22.  Thanks, Anda Kraft

## 2022-09-24 ENCOUNTER — Other Ambulatory Visit (INDEPENDENT_AMBULATORY_CARE_PROVIDER_SITE_OTHER): Payer: Medicare Other

## 2022-09-24 DIAGNOSIS — M1A9XX0 Chronic gout, unspecified, without tophus (tophi): Secondary | ICD-10-CM

## 2022-09-24 DIAGNOSIS — E1159 Type 2 diabetes mellitus with other circulatory complications: Secondary | ICD-10-CM

## 2022-09-24 LAB — LIPID PANEL
Cholesterol: 126 mg/dL (ref 0–200)
HDL: 57.1 mg/dL (ref >39.00)
LDL Cholesterol: 51 mg/dL (ref 0–99)
NonHDL: 68.45
Total CHOL/HDL Ratio: 2
Triglycerides: 86 mg/dL (ref 0.0–149.0)
VLDL: 17.2 mg/dL (ref 0.0–40.0)

## 2022-09-24 LAB — COMPREHENSIVE METABOLIC PANEL
ALT: 17 U/L (ref 0–53)
AST: 19 U/L (ref 0–37)
Albumin: 4.6 g/dL (ref 3.5–5.2)
Alkaline Phosphatase: 70 U/L (ref 39–117)
BUN: 6 mg/dL (ref 6–23)
CO2: 31 mEq/L (ref 19–32)
Calcium: 9.5 mg/dL (ref 8.4–10.5)
Chloride: 99 mEq/L (ref 96–112)
Creatinine, Ser: 0.77 mg/dL (ref 0.40–1.50)
GFR: 93.72 mL/min (ref >60.00)
Glucose, Bld: 102 mg/dL — ABNORMAL HIGH (ref 70–99)
Potassium: 4.3 mEq/L (ref 3.5–5.1)
Sodium: 137 mEq/L (ref 135–145)
Total Bilirubin: 1.3 mg/dL — ABNORMAL HIGH (ref 0.2–1.2)
Total Protein: 6.9 g/dL (ref 6.0–8.3)

## 2022-09-24 LAB — MICROALBUMIN / CREATININE URINE RATIO
Creatinine,U: 126.2 mg/dL
Microalb Creat Ratio: 0.8 mg/g (ref 0.0–30.0)
Microalb, Ur: 1 mg/dL (ref 0.0–1.9)

## 2022-09-24 LAB — URIC ACID: Uric Acid, Serum: 5.7 mg/dL (ref 4.0–7.8)

## 2022-09-24 LAB — HEMOGLOBIN A1C: Hgb A1c MFr Bld: 6.7 % — ABNORMAL HIGH (ref 4.6–6.5)

## 2022-09-25 NOTE — Progress Notes (Signed)
No critical labs need to be addressed urgently. We will discuss labs in detail at upcoming office visit.   

## 2022-10-01 ENCOUNTER — Ambulatory Visit: Payer: Medicare Other | Admitting: Family Medicine

## 2022-10-03 ENCOUNTER — Encounter: Payer: Self-pay | Admitting: Family Medicine

## 2022-10-03 ENCOUNTER — Ambulatory Visit (INDEPENDENT_AMBULATORY_CARE_PROVIDER_SITE_OTHER): Payer: Medicare Other | Admitting: Family Medicine

## 2022-10-03 VITALS — BP 150/88 | HR 79 | Temp 97.3°F | Ht 71.0 in | Wt 244.2 lb

## 2022-10-03 DIAGNOSIS — E1159 Type 2 diabetes mellitus with other circulatory complications: Secondary | ICD-10-CM | POA: Diagnosis not present

## 2022-10-03 DIAGNOSIS — E1169 Type 2 diabetes mellitus with other specified complication: Secondary | ICD-10-CM | POA: Diagnosis not present

## 2022-10-03 DIAGNOSIS — E785 Hyperlipidemia, unspecified: Secondary | ICD-10-CM

## 2022-10-03 DIAGNOSIS — I152 Hypertension secondary to endocrine disorders: Secondary | ICD-10-CM

## 2022-10-03 DIAGNOSIS — E291 Testicular hypofunction: Secondary | ICD-10-CM

## 2022-10-03 NOTE — Assessment & Plan Note (Signed)
Stable, chronic.  Continue current medication.  LDL at goal less than 100 on simvastatin 20 mg daily

## 2022-10-03 NOTE — Assessment & Plan Note (Signed)
Excellent control with diet  

## 2022-10-03 NOTE — Progress Notes (Signed)
Patient ID: Cody Day, male    DOB: [birth year], 66 y.o.   MRN: [id number]  This visit was conducted in person.  BP (!) 150/88   Pulse 79   Temp (!) 97.3 F (36.3 C) (Temporal)   Ht '5\' 11"'$  (1.803 m)   Wt 244 lb 3.2 oz (110.8 kg)   SpO2 96%   BMI 34.06 kg/m    CC:  Chief Complaint  Patient presents with   Diabetes    Here for f/u.    Subjective:   HPI: NYEEM STOKE is a 66 y.o. male presenting on 10/03/2022 for Diabetes (Here for f/u.)   Diabetes:  Lab Results  Component Value Date   HGBA1C 6.7 (H) 09/24/2022  Microalbumin negative Using medications without difficulties: Hypoglycemic episodes: Hyperglycemic episodes: Feet problems: Blood Sugars averaging: eye exam within last year: Due  Wt Readings from Last 3 Encounters:  10/03/22 244 lb 3.2 oz (110.8 kg)  08/20/22 249 lb (112.9 kg)  05/20/22 235 lb (106.6 kg)   No gout flares  Hypertension:  Above goal today in office BP Readings from Last 3 Encounters:  10/03/22 (!) 150/88  08/20/22 124/78  05/20/22 118/76  Using medication without problems or lightheadedness:  none Chest pain with exertion: none Edema:none Short of breath: stable  Average home BPs: Other issues:   Elevated Cholesterol: LDL at goal less than 100 on simvastatin 20 mg daily Lab Results  Component Value Date   CHOL 126 09/24/2022   HDL 57.10 09/24/2022   LDLCALC 51 09/24/2022   LDLDIRECT 53.0 08/23/2016   TRIG 86.0 09/24/2022   CHOLHDL 2 09/24/2022  Using medications without problems:none Muscle aches: none Diet compliance: moderate Exercise: minimal Other complaints:   Recent sleep study.. plans to get get new CPAP mask etc.     Relevant past medical, surgical, family and social history reviewed and updated as indicated. Interim medical history since our last visit reviewed. Allergies and medications reviewed and updated. Outpatient Medications Prior to Visit  Medication Sig Dispense Refill   albuterol  (PROVENTIL) (2.5 MG/3ML) 0.083% nebulizer solution Take 3 mLs (2.5 mg total) by nebulization every 6 (six) hours as needed for wheezing or shortness of breath. 75 mL 3   allopurinol (ZYLOPRIM) 300 MG tablet TAKE 1 TABLET BY MOUTH DAILY 90 tablet 3   citalopram (CELEXA) 40 MG tablet TAKE 1 TABLET BY MOUTH DAILY 90 tablet 1   lisinopril (ZESTRIL) 40 MG tablet Take 1 tablet (40 mg total) by mouth daily. 90 tablet 3   mometasone-formoterol (DULERA) 200-5 MCG/ACT AERO Inhale 2 puffs into the lungs in the morning and at bedtime. 1 each 5   montelukast (SINGULAIR) 10 MG tablet TAKE 1 TABLET BY MOUTH DAILY 90 tablet 3   Multiple Vitamin (MULTIVITAMIN WITH MINERALS) TABS tablet Take 1 tablet by mouth daily. 30 tablet 2   simvastatin (ZOCOR) 20 MG tablet TAKE 1 TABLET BY MOUTH DAILY 90 tablet 3   testosterone cypionate (DEPOTESTOSTERONE CYPIONATE) 200 MG/ML injection Inject. 0.55m in muscle 1 time weekly. 5 mL 0   VENTOLIN HFA 108 (90 Base) MCG/ACT inhaler INHALE 2 PUFFS INTO THE LUNGS EVERY 6 HOURS AS NEEDED FOR WHEEZING OR SHORTNESS OF BREATH 18 g 2   No facility-administered medications prior to visit.     Per HPI unless specifically indicated in ROS section below Review of Systems  Constitutional:  Negative for fatigue and fever.  HENT:  Negative for ear pain.   Eyes:  Negative for pain.  Respiratory:  Positive for shortness of breath. Negative for cough.   Cardiovascular:  Negative for chest pain, palpitations and leg swelling.  Gastrointestinal:  Negative for abdominal pain.  Genitourinary:  Negative for dysuria.  Musculoskeletal:  Negative for arthralgias.  Neurological:  Negative for syncope, light-headedness and headaches.  Psychiatric/Behavioral:  Negative for dysphoric mood.    Objective:  BP (!) 150/88   Pulse 79   Temp (!) 97.3 F (36.3 C) (Temporal)   Ht '5\' 11"'$  (1.803 m)   Wt 244 lb 3.2 oz (110.8 kg)   SpO2 96%   BMI 34.06 kg/m   Wt Readings from Last 3 Encounters:  10/03/22  244 lb 3.2 oz (110.8 kg)  08/20/22 249 lb (112.9 kg)  05/20/22 235 lb (106.6 kg)      Physical Exam Constitutional:      General: He is not in acute distress.    Appearance: Normal appearance. He is well-developed. He is not ill-appearing or toxic-appearing.  HENT:     Head: Normocephalic and atraumatic.     Right Ear: Hearing, tympanic membrane, ear canal and external ear normal.     Left Ear: Hearing, tympanic membrane, ear canal and external ear normal.     Nose: Nose normal.     Mouth/Throat:     Pharynx: Uvula midline.  Eyes:     General: Lids are normal. Lids are everted, no foreign bodies appreciated.     Conjunctiva/sclera: Conjunctivae normal.     Pupils: Pupils are equal, round, and reactive to light.  Neck:     Thyroid: No thyroid mass or thyromegaly.     Vascular: No carotid bruit.     Trachea: Trachea and phonation normal.  Cardiovascular:     Rate and Rhythm: Normal rate and regular rhythm.     Pulses: Normal pulses.     Heart sounds: S1 normal and S2 normal. No murmur heard.    No gallop.  Pulmonary:     Breath sounds: Normal breath sounds. Decreased air movement present. No wheezing, rhonchi or rales.  Abdominal:     General: Bowel sounds are normal.     Palpations: Abdomen is soft.     Tenderness: There is no abdominal tenderness. There is no guarding or rebound.     Hernia: No hernia is present.  Musculoskeletal:     Cervical back: Normal range of motion and neck supple.  Lymphadenopathy:     Cervical: No cervical adenopathy.  Skin:    General: Skin is warm and dry.     Findings: No rash.  Neurological:     Mental Status: He is alert.     Cranial Nerves: No cranial nerve deficit.     Sensory: No sensory deficit.     Gait: Gait normal.     Deep Tendon Reflexes: Reflexes are normal and symmetric.  Psychiatric:        Speech: Speech normal.        Behavior: Behavior normal.        Judgment: Judgment normal.       Results for orders placed or  performed in visit on 09/24/22  Uric acid  Result Value Ref Range   Uric Acid, Serum 5.7 4.0 - 7.8 mg/dL  Microalbumin / creatinine urine ratio  Result Value Ref Range   Microalb, Ur 1.0 0.0 - 1.9 mg/dL   Creatinine,U 126.2 mg/dL   Microalb Creat Ratio 0.8 0.0 - 30.0 mg/g  Comprehensive metabolic panel  Result Value Ref Range  Sodium 137 135 - 145 mEq/L   Potassium 4.3 3.5 - 5.1 mEq/L   Chloride 99 96 - 112 mEq/L   CO2 31 19 - 32 mEq/L   Glucose, Bld 102 (H) 70 - 99 mg/dL   BUN 6 6 - 23 mg/dL   Creatinine, Ser 0.77 0.40 - 1.50 mg/dL   Total Bilirubin 1.3 (H) 0.2 - 1.2 mg/dL   Alkaline Phosphatase 70 39 - 117 U/L   AST 19 0 - 37 U/L   ALT 17 0 - 53 U/L   Total Protein 6.9 6.0 - 8.3 g/dL   Albumin 4.6 3.5 - 5.2 g/dL   GFR 93.72 >60.00 mL/min   Calcium 9.5 8.4 - 10.5 mg/dL  Lipid panel  Result Value Ref Range   Cholesterol 126 0 - 200 mg/dL   Triglycerides 86.0 0.0 - 149.0 mg/dL   HDL 57.10 >39.00 mg/dL   VLDL 17.2 0.0 - 40.0 mg/dL   LDL Cholesterol 51 0 - 99 mg/dL   Total CHOL/HDL Ratio 2    NonHDL 68.45   Hemoglobin A1c  Result Value Ref Range   Hgb A1c MFr Bld 6.7 (H) 4.6 - 6.5 %     COVID 19 screen:  No recent travel or known exposure to COVID19 The patient denies respiratory symptoms of COVID 19 at this time. The importance of social distancing was discussed today.   Assessment and Plan Problem List Items Addressed This Visit     Controlled diabetes mellitus with circulatory complication  ( HTN) ([place]) - Primary    Excellent control with diet      Hyperlipidemia associated with type 2 diabetes mellitus (H[facility])    Stable, chronic.  Continue current medication.  LDL at goal less than 100 on simvastatin 20 mg daily      Hypertension associated with diabetes ([place])    Chronic, well-controlled at home on lisinopril 40 mg daily.      Hypogonadism in male    Followed by urology on testosterone.         Eliezer Lofts, MD

## 2022-10-03 NOTE — Patient Instructions (Addendum)
Set up yearly eye exam for diabetes and have the opthalmologist send [place] a copy of the evaluation for the chart.  Decrease sweets and carbohydrates.   Start silver sneakers/increase exercise.

## 2022-10-03 NOTE — Assessment & Plan Note (Signed)
Followed by urology on testosterone.

## 2022-10-03 NOTE — Assessment & Plan Note (Signed)
Chronic, well-controlled at home on lisinopril 40 mg daily.

## 2022-10-08 ENCOUNTER — Encounter: Payer: Self-pay | Admitting: Cardiovascular Disease

## 2022-10-08 ENCOUNTER — Ambulatory Visit: Payer: Medicare Other | Attending: Cardiovascular Disease | Admitting: Cardiovascular Disease

## 2022-10-08 VITALS — BP 142/91 | HR 88 | Ht 71.0 in | Wt 245.8 lb

## 2022-10-08 DIAGNOSIS — I10 Essential (primary) hypertension: Secondary | ICD-10-CM | POA: Insufficient documentation

## 2022-10-08 DIAGNOSIS — R0602 Shortness of breath: Secondary | ICD-10-CM | POA: Diagnosis not present

## 2022-10-08 DIAGNOSIS — E785 Hyperlipidemia, unspecified: Secondary | ICD-10-CM | POA: Diagnosis not present

## 2022-10-08 DIAGNOSIS — R072 Precordial pain: Secondary | ICD-10-CM | POA: Insufficient documentation

## 2022-10-08 NOTE — Progress Notes (Signed)
Cardiology Office Note   Date:  10/08/2022   ID:  Cody Day, Cody Day 10/03/1956, MRN [id number]  PCP:  [name], MD  Cardiologist:   Kathlyn Sacramento, MD   Chief Complaint  Patient presents with   Follow-up    SOB, dizziness constantly,      History of Present Illness: Cody Day is a 66 y.o. male who was referred by Geraldo Pitter for evaluation of exertional dyspnea.  The patient has known history of asthma/COPD, anxiety, sleep apnea, obesity, essential hypertension, hyperlipidemia and previous tobacco and drug use.  He reports inhaling different kind of drugs that might have affected his lungs.  He had extensive drug use from the age of 43 to 35 years old. He was seen by me in 2018 for exertional dyspnea and possible cardiomyopathy.  I proceeded with a right and left cardiac catheterization in March 2018 which showed mild nonobstructive coronary artery disease, normal LV systolic function and normal right heart catheterization.  He continues to struggle with exertional dyspnea with wheezing and chest tightness.  There has been some worsening recently even though his pulmonary function test remained stable.  Due to that, there is a concern about an alternative etiology. He is severely limited by shortness of breath.  Past Medical History:  Diagnosis Date   Achilles tendon rupture 10/11   and repair   Adenomatous polyp of colon 04/06/2010   Adenomatous polyps 3 in the descending colon, times one in the ascending colon [facility])   Asthma    COPD (chronic obstructive pulmonary disease) (HCC)    Gout    Hemorrhoids    Hypercholesteremia    Hypertension    Sleep apnea    doesn't wear CPAP   Syncope 10/11   in settin gof asthma exacerbation w coughing. Ech (10/11): EF > 55%, mild LVH, grade I diastolic dysfunction, nomral RV size and systolic function,. normal valves. Carotid US (10/11): minimal disease    Past Surgical History:  Procedure Laterality Date    ABDOMINAL HERNIA REPAIR  1998,2000   calcium kidney stones     [facility]    COLONOSCOPY  2011   [facility], [name], [place]; Dr. [name]. Multiple adenomatous polyps (total 4).    COLONOSCOPY WITH PROPOFOL N/A 08/30/2015   Procedure: COLONOSCOPY WITH PROPOFOL;  Surgeon: [name], MD;  Location: [facility] ENDOSCOPY;  Service: Endoscopy;  Laterality: N/A;   FEMORAL HERNIA REPAIR  2007   HEMORRHOID SURGERY     HERNIA REPAIR  09/10/2010   Repair of recurrent ventral hernia, resection of previously placed mesh x2, implantation 7.5 x 10 cm Gore-Tex dual mesh in an underlay position, repair of epigastric hernia with a 4.2 cm Proceed ventral patch.   HERNIA REPAIR   02//28/2007   Laparoscopic right inguinal hernia repair with Surgipro mesh, Ventrilex patch at umbilicus   HERNIA REPAIR  11/01/1990  .   Small oval Kugel patch placed in preperitoneal space, [name], MD   HERNIA REPAIR  01/17/1997   Recurrent ventral hernia 15 x 19 Gore-Tex dual mesh placed laparoscopically with multiple lefft--sided ports, [name], MD   HERNIA REPAIR  01/07/1995    Primary repair of ventral hernia. [name], MD   intestinal blockage     as a child   KIDNEY STONE extraction     unic acid stones   left heart cath  2008   minimal luminal irregularities EF 55%  RIGHT/LEFT HEART CATH AND CORONARY ANGIOGRAPHY N/A 01/13/2017   Procedure: Right/Left Heart Cath and Coronary Angiography;  Surgeon: [name], MD;  Location: [place] [facility];  Service: Cardiovascular;  Laterality: N/A;   treadmill stress test  2003     Current Outpatient Medications  Medication Sig Dispense Refill   albuterol (PROVENTIL) (2.5 MG/3ML) 0.083% nebulizer solution Take 3 mLs (2.5 mg total) by nebulization every 6 (six) hours as needed for wheezing or shortness of breath. 75 mL 3   allopurinol (ZYLOPRIM) 300 MG tablet TAKE 1 TABLET BY MOUTH DAILY 90 tablet 3   citalopram  (CELEXA) 40 MG tablet TAKE 1 TABLET BY MOUTH DAILY 90 tablet 1   lisinopril (ZESTRIL) 40 MG tablet Take 1 tablet (40 mg total) by mouth daily. 90 tablet 3   mometasone-formoterol (DULERA) 200-5 MCG/ACT AERO Inhale 2 puffs into the lungs in the morning and at bedtime. 1 each 5   montelukast (SINGULAIR) 10 MG tablet TAKE 1 TABLET BY MOUTH DAILY 90 tablet 3   Multiple Vitamin (MULTIVITAMIN WITH MINERALS) TABS tablet Take 1 tablet by mouth daily. 30 tablet 2   simvastatin (ZOCOR) 20 MG tablet TAKE 1 TABLET BY MOUTH DAILY 90 tablet 3   testosterone cypionate (DEPOTESTOSTERONE CYPIONATE) 200 MG/ML injection Inject. 0.6m in muscle 1 time weekly. 5 mL 0   VENTOLIN HFA 108 (90 Base) MCG/ACT inhaler INHALE 2 PUFFS INTO THE LUNGS EVERY 6 HOURS AS NEEDED FOR WHEEZING OR SHORTNESS OF BREATH 18 g 2   No current facility-administered medications for this visit.    Allergies:   Levaquin [levofloxacin in d5w] and Penicillins    Social History:  The patient  reports that he quit smoking about 26 years ago. His smoking use included cigarettes. He has a 8.75 pack-year smoking history. His smokeless tobacco use includes chew. He reports that he does not currently use alcohol. He reports that he does not use drugs.   Family History:  The patient's family history includes Cataracts in his father; Colon cancer in his brother and maternal aunt; Coronary artery disease in his mother; Dementia in his mother; Depression in his sister; Diabetes in his father and another family member; Heart attack (age of onset: 746 in his mother; Hypertension in his brother, father, and sister.    ROS:  Please see the history of present illness.   Otherwise, review of systems are positive for none.   All other systems are reviewed and negative.    PHYSICAL EXAM: VS:  BP (!) 142/91 (BP Location: Left Arm, Patient Position: Sitting, Cuff Size: Normal)   Pulse 88   Ht '5\' 11"'$  (1.803 m)   Wt 245 lb 12.8 oz (111.5 kg)   SpO2 98%   BMI  34.28 kg/m  , BMI Body mass index is 34.28 kg/m. GEN: Well nourished, well developed, in no acute distress  HEENT: normal  Neck: no JVD, carotid bruits, or masses Cardiac: RRR; no murmurs, rubs, or gallops,no edema  Respiratory:  clear to auscultation bilaterally with mildly diminished breath sounds GI: soft, nontender, nondistended, + BS MS: no deformity or atrophy  Skin: warm and dry, no rash Neuro:  Strength and sensation are intact Psych: euthymic mood, full affect   EKG:  EKG is ordered today. The ekg ordered today demonstrates normal sinus rhythm with nonspecific T wave changes.   Recent Labs: 03/22/2022: TSH 2.01 09/24/2022: ALT 17; BUN 6; Creatinine, Ser 0.77; Potassium 4.3; Sodium 137    Lipid Panel  Component Value Date/Time   CHOL 126 09/24/2022 0751   TRIG 86.0 09/24/2022 0751   HDL 57.10 09/24/2022 0751   CHOLHDL 2 09/24/2022 0751   VLDL 17.2 09/24/2022 0751   LDLCALC 51 09/24/2022 0751   LDLDIRECT 53.0 08/23/2016 0857      Wt Readings from Last 3 Encounters:  10/08/22 245 lb 12.8 oz (111.5 kg)  10/03/22 244 lb 3.2 oz (110.8 kg)  08/20/22 249 lb (112.9 kg)           02/03/2020    8:46 AM  PAD Screen  Previous PAD dx? No  Previous surgical procedure? No  Pain with walking? No  Feet/toe relief with dangling? No  Painful, non-healing ulcers? No  Extremities discolored? No      ASSESSMENT AND PLAN:  1.  Exertional dyspnea with associated chest tightness and frequent wheezing: Likely due to his underlying lung disease but there has been some worsening in symptoms recently with no clear explanation by pulmonary function testing.  I recommend evaluation with an echocardiogram as well as a Lexiscan nuclear stress test. Previous cardiac catheterization 2018 showed mild nonobstructive coronary artery disease and right heart catheterization at that time showed no evidence of heart failure or pulmonary hypertension.  2.  Essential hypertension: Blood  pressure is reasonably controlled.  3.  Hyperlipidemia: Currently on simvastatin 20 mg daily.   Disposition:   FU after cardiac testing.  Signed,  Kathlyn Sacramento, MD  10/08/2022 5:27 PM    [facility]

## 2022-10-08 NOTE — Patient Instructions (Signed)
Medication Instructions:  No changes *If you need a refill on your cardiac medications before your next appointment, please call your pharmacy*   Lab Work: None ordered If you have labs (blood work) drawn today and your tests are completely normal, you will receive your results only by: [place] (if you have MyChart) OR A paper copy in the mail If you have any lab test that is abnormal or we need to change your treatment, we will call you to review the results.   Testing/Procedures: Your physician has requested that you have an echocardiogram. Echocardiography is a painless test that uses sound waves to create images of your heart. It provides your doctor with information about the size and shape of your heart and how well your heart's chambers and valves are working.   You may receive an ultrasound enhancing agent through an IV if needed to better visualize your heart during the echo. This procedure takes approximately one hour.  There are no restrictions for this procedure.  This will take place at [place] ([place]) [id number], [place]  Your provider has ordered a [place] test. This will take place at [facility]. Please report to the [facility] medical mall entrance. The volunteers at the first desk will direct you where to go.  [place]  Your provider has ordered a Stress Test with nuclear imaging. The purpose of this test is to evaluate the blood supply to your heart muscle. This procedure is referred to as a "Non-Invasive Stress Test." This is because other than having an IV started in your vein, nothing is inserted or "invades" your body. Cardiac stress tests are done to find areas of poor blood flow to the heart by determining the extent of coronary artery disease (CAD). Some patients exercise on a treadmill, which naturally increases the blood flow to your heart, while others who are unable to walk on a treadmill due to physical limitations  will have a pharmacologic/chemical stress agent called Lexiscan . This medicine will mimic walking on a treadmill by temporarily increasing your coronary blood flow.   Please note: these test may take anywhere between 2-4 hours to complete  How to prepare for your Myoview test:  Do not eat or drink after midnight No caffeine for 24 hours prior to test No smoking 24 hours prior to test. Your medication may be taken with water.  If your doctor stopped a medication because of this test, do not take that medication. Ladies, please do not wear dresses.  Skirts or pants are appropriate. Please wear a short sleeve shirt. No perfume, cologne or lotion. Wear comfortable walking shoes. No heels!   PLEASE NOTIFY THE OFFICE AT LEAST 6 HOURS IN ADVANCE IF YOU ARE UNABLE TO KEEP YOUR APPOINTMENT.  [phone] AND  PLEASE NOTIFY NUCLEAR MEDICINE AT [facility] AT LEAST 24 HOURS IN ADVANCE IF YOU ARE UNABLE TO KEEP YOUR APPOINTMENT. [phone]    Follow-Up: At [facility], you and your health needs are our priority.  As part of our continuing mission to provide you with exceptional heart care, we have created designated Provider Care Teams.  These Care Teams include your primary Cardiologist (physician) and Advanced Practice Providers (APPs -  Physician Assistants and Nurse Practitioners) who all work together to provide you with the care you need, when you need it.  We recommend signing up for the patient portal called "MyChart".  Sign up information is provided on this After Visit Summary.  MyChart is used to connect with patients for Virtual Visits (Telemedicine).  Patients are able to view lab/test results, encounter notes, upcoming appointments, etc.  Non-urgent messages can be sent to your provider as well.   To learn more about what you can do with MyChart, go to [email or web address].    Your next appointment:   Follow up with Dr. Fletcher Anon or APP after the testing  Important Information  About [place]

## 2022-10-24 ENCOUNTER — Ambulatory Visit: Payer: PPO | Attending: Cardiovascular Disease

## 2022-10-24 DIAGNOSIS — R0602 Shortness of breath: Secondary | ICD-10-CM

## 2022-10-24 MED ORDER — PERFLUTREN LIPID MICROSPHERE
1.0000 mL | INTRAVENOUS | Status: AC | PRN
  Administered 2022-10-24: 2 mL via INTRAVENOUS

## 2022-10-25 LAB — ECHOCARDIOGRAM COMPLETE
AR max vel: 2.59 cm2
AV Area VTI: 2.73 cm2
AV Area mean vel: 2.43 cm2
AV Mean grad: 4 mmHg
AV Peak grad: 6.5 mmHg
Ao pk vel: 1.27 m/s
Area-P 1/2: 3.03 cm2
Calc EF: 61.3 %
S' Lateral: 3.4 cm
Single Plane A2C EF: 36 %
Single Plane A4C EF: 72.9 %

## 2022-10-29 ENCOUNTER — Ambulatory Visit
Admission: RE | Admit: 2022-10-29 | Discharge: 2022-10-29 | Disposition: A | Discharge status: Home / Self Care | Payer: PPO | Source: Ambulatory Visit | Attending: Cardiovascular Disease | Admitting: Cardiovascular Disease

## 2022-10-29 DIAGNOSIS — R072 Precordial pain: Secondary | ICD-10-CM | POA: Insufficient documentation

## 2022-10-29 LAB — NM MYOCAR MULTI W/SPECT W/WALL MOTION / EF
LV dias vol: 132 mL (ref 62–150)
LV sys vol: 59 mL
Nuc Stress EF: 55 %
Peak HR: 90 {beats}/min
Percent HR: 58 %
Rest HR: 72 {beats}/min
Rest Nuclear Isotope Dose: 10.8 mCi
SDS: 1
SRS: 6
SSS: 4
ST Depression (mm): 0 mm
Stress Nuclear Isotope Dose: 31.7 mCi
TID: 1.02

## 2022-10-29 MED ORDER — TECHNETIUM TC 99M TETROFOSMIN IV KIT
31.7100 | PACK | Freq: Once | INTRAVENOUS | Status: AC | PRN
  Administered 2022-10-29: 31.71 via INTRAVENOUS

## 2022-10-29 MED ORDER — TECHNETIUM TC 99M TETROFOSMIN IV KIT
10.0000 | PACK | Freq: Once | INTRAVENOUS | Status: AC | PRN
  Administered 2022-10-29: 10.78 via INTRAVENOUS

## 2022-10-29 MED ORDER — REGADENOSON 0.4 MG/5ML IV SOLN
0.4000 mg | Freq: Once | INTRAVENOUS | Status: AC
  Administered 2022-10-29: 0.4 mg via INTRAVENOUS
  Filled 2022-10-29: qty 5

## 2022-10-30 ENCOUNTER — Telehealth: Payer: Self-pay | Admitting: Cardiovascular Disease

## 2022-10-30 NOTE — Telephone Encounter (Signed)
Patient was calling for results from test

## 2022-10-30 NOTE — Telephone Encounter (Signed)
Pt called to discuss results. Nurse informed pt once preliminary results are reviewed by MD, nurse will call with results. Pt verbalized understanding.

## 2022-11-01 ENCOUNTER — Other Ambulatory Visit: Payer: Self-pay | Admitting: Family Medicine

## 2022-11-01 ENCOUNTER — Other Ambulatory Visit: Payer: Self-pay | Admitting: Urology

## 2022-11-01 DIAGNOSIS — E291 Testicular hypofunction: Secondary | ICD-10-CM

## 2022-11-05 ENCOUNTER — Encounter: Payer: Self-pay | Admitting: Internal Medicine

## 2022-11-05 ENCOUNTER — Ambulatory Visit: Payer: PPO | Admitting: Internal Medicine

## 2022-11-05 VITALS — BP 140/90 | HR 87 | Temp 98.0°F | Ht 71.0 in | Wt 248.8 lb

## 2022-11-05 DIAGNOSIS — G4733 Obstructive sleep apnea (adult) (pediatric): Secondary | ICD-10-CM | POA: Diagnosis not present

## 2022-11-05 DIAGNOSIS — J9611 Chronic respiratory failure with hypoxia: Secondary | ICD-10-CM | POA: Diagnosis not present

## 2022-11-05 DIAGNOSIS — R0689 Other abnormalities of breathing: Secondary | ICD-10-CM

## 2022-11-05 DIAGNOSIS — J449 Chronic obstructive pulmonary disease, unspecified: Secondary | ICD-10-CM

## 2022-11-05 NOTE — Patient Instructions (Addendum)
Continue albuterol as needed  Recommend using CPAP and oxygen therapy as previously prescribed You are at a high risk for heart failure and a high risk for stroke if you do not use your oxygen therapy or CPAP machine  You have a significant reduction in your lung function  Avoid secondhand smoke

## 2022-11-05 NOTE — Progress Notes (Signed)
Pulmonary function testing shows moderate  Name: Cody Day MRN: [id number] DOB: 26-Jul-1956     CONSULTATION DATE: 12.9.19 REFERRING MD : Cody Day  CHIEF COMPLAINT:   Follow-up COPD follow-up hypoxic respiratory failure  Follow-up OSA   STUDIES:     9.30.10 CXR independently reviewed by Me today Low lung volumes No pneumonia   Synopsis: Cody Day first saw the [facility] pulmonary clinic in the spring of 2015 for shortness of breath, cough, mucus production. He had lung function testing in 2014 which was completely normal. He had a past history significant for long-term inhaled illicit drug use with multiple substances including methamphetamine/crack cocaine. Patient has smoked multiple substances in the past including crack marijuana smoking cigarettes Patient also has a history of alcohol abuse  Later PFT findings have shown airflow obstruction.     HISTORY OF PRESENT ILLNESS: Chronic shortness of breath chronic dyspnea on exertion Moderate COPD on pulmonary function test Patient refuses to wear oxygen therapy refuses to wear CPAP therapy  No exacerbation at this time No evidence of heart failure at this time No evidence or signs of infection at this time No respiratory distress No fevers, chills, nausea, vomiting, diarrhea No evidence of lower extremity edema No evidence hemoptysis   Patient is at high risk for stroke heart failure respiratory compromise due to his noncompliance    PAST MEDICAL HISTORY :   has a past medical history of Achilles tendon rupture (10/11), Adenomatous polyp of colon (04/06/2010), Asthma, COPD (chronic obstructive pulmonary disease) ([place]), Gout, Hemorrhoids, Hypercholesteremia, Hypertension, Sleep apnea, and Syncope (10/11).  has a past surgical history that includes treadmill stress test (2003); intestinal blockage; Abdominal hernia repair (5176,1607); Femoral hernia repair (2007); left heart cath (2008); KIDNEY STONE extraction;  calcium kidney stones; Colonoscopy (2011); Hemorrhoid surgery; Hernia repair (09/10/2010); Hernia repair ( 02//28/2007); Hernia repair (11/01/1990  .); Hernia repair (01/17/1997); Hernia repair (01/07/1995 ); Cardiac catheterization; Colonoscopy with propofol (N/A, 08/30/2015); and RIGHT/LEFT HEART CATH AND CORONARY ANGIOGRAPHY (N/A, 01/13/2017). Prior to Admission medications   Medication Sig Start Date End Date Taking? Authorizing Provider  albuterol (PROVENTIL) (2.5 MG/3ML) 0.083% nebulizer solution Take 3 mLs (2.5 mg total) by nebulization every 4 (four) hours as needed for wheezing or shortness of breath. 04/17/18   Day, Cody E, MD  allopurinol (ZYLOPRIM) 300 MG tablet Take 1 tablet (300 mg total) by mouth daily. 06/09/18   Day, Cody E, MD  Cholecalciferol (VITAMIN D3) 50000 units CAPS Take 1 capsule by mouth once a week. 06/09/18   Day, Cody E, MD  citalopram (CELEXA) 20 MG tablet Take 1 tablet (20 mg total) by mouth daily. 07/22/18 10/20/18  Cody Lighter, MD  fluticasone (FLOVENT HFA) 220 MCG/ACT inhaler Inhale 2 puffs into the lungs 2 (two) times daily.    [provider]  lisinopril (PRINIVIL,ZESTRIL) 20 MG tablet TAKE ONE (1) TABLET EACH DAY 06/12/18   Day, Cody E, MD  montelukast (SINGULAIR) 10 MG tablet Take 1 tablet (10 mg total) by mouth daily. 06/09/18 06/09/19  Cody Sanders, MD  Multiple Vitamin (MULTIVITAMIN WITH MINERALS) TABS tablet Take 1 tablet by mouth daily. 07/22/18   Cody Lighter, MD  PROAIR HFA 108 [phone] Base) MCG/ACT inhaler Use 2 inhalations every four to six hours as needed 09/14/18   Cody Day, Cody E, MD  simvastatin (ZOCOR) 20 MG tablet TAKE ONE TABLET BY MOUTH EVERY DAY 09/23/18   Cody Sanders, MD  STIOLTO RESPIMAT 2.5-2.5 MCG/ACT AERS Use two inhalations every day 09/14/18  Day, Cody E, MD  testosterone cypionate (DEPOTESTOTERONE CYPIONATE) 100 MG/ML injection Inject 1 mL (100 mg total) into the muscle once a week. 09/23/18   Day, Cody Fairly,  MD   Allergies  Allergen Reactions   Levaquin [Levofloxacin In D5w]     Severe headaches, skin tingling/redness   Penicillins Hives and Rash    Has patient had a PCN reaction causing immediate rash, facial/tongue/throat swelling, SOB or lightheadedness with hypotension: Yes Has patient had a PCN reaction causing severe rash involving mucus membranes or skin necrosis: Unknown Has patient had a PCN reaction that required hospitalization: No Has patient had a PCN reaction occurring within the last 10 years: No - Childhood reaction If all of the above answers are "NO", then may proceed with Cephalosporin use.     SOCIAL HISTORY:  reports that he quit smoking about 27 years ago. His smoking use included cigarettes. He has a 8.75 pack-year smoking history. His smokeless tobacco use includes chew. He reports that he does not currently use alcohol. He reports that he does not use drugs.      BP (!) 140/90 (BP Location: Left Arm, Cuff Size: Normal)   Pulse 87   Temp 98 F (36.7 C) (Temporal)   Ht '5\' 11"'$  (1.803 m)   Wt 248 lb 12.8 oz (112.9 kg)   SpO2 96%   BMI 34.70 kg/m       Review of Systems: Gen:  Denies  fever, sweats, chills weight loss  HEENT: Denies blurred vision, double vision, ear pain, eye pain, hearing loss, nose bleeds, sore throat Cardiac:  No dizziness, chest pain or heaviness, chest tightness,edema, No JVD Resp:   No cough, -sputum production, -shortness of breath,-wheezing, -hemoptysis,  Other:  All other systems negative    Physical Examination:   General Appearance: No distress  EYES PERRLA, EOM intact.   NECK Supple, No JVD Pulmonary: normal breath sounds, No wheezing.  CardiovascularNormal S1,S2.  No m/r/g.   Abdomen: Benign, Soft, non-tender. ALL OTHER ROS ARE NEGATIVE      ASSESSMENT / PLAN:  67 year old white male seen today for chronic persistent shortness of breath with moderate COPD with deconditioned state history of peripheral artery  disease and vertigo with chronic hypoxic respiratory failure with sleep apnea who is noncompliant with oxygen therapy and CPAP therapy   Moderate COPD deconditioned state Continue Trelegy therapy until finished and then restart Dulera therapy Continue Ventolin as needed Avoid secondhand smoke Avoid sick contacts No signs of exacerbation at this time No indication for steroids or antibiotics at this time   Chronic hypoxic respiratory failure from COPD Patient is noncompliant with oxygen therapy Patient is noncompliant with CPAP therapy   Obesity -recommend significant weight loss -recommend changing diet  Deconditioned state -Recommend increased daily activity and exercise    PAD-follow up vascular surgery  OSA-non-complaint with CPAP      MEDICATION ADJUSTMENTS/LABS AND TESTS ORDERED: Continue albuterol as needed  Recommend using CPAP and oxygen therapy as previously prescribed You are at a high risk for heart failure and a high risk for stroke if you do not use your oxygen therapy or CPAP machine  You have a significant reduction in your lung function  Avoid secondhand smoke   CURRENT MEDICATIONS REVIEWED AT LENGTH WITH PATIENT TODAY   Patient satisfied with Plan of action and management. All questions answered  Total time spent 35 minutes   Follow up in 6 months     Sabre Leonetti Patricia Pesa, M.D.  [facility]  Pulmonary & Critical Care Medicine  Medical Director [place] Director [facility] Cardio-Pulmonary Department

## 2022-11-07 ENCOUNTER — Telehealth: Payer: Self-pay | Admitting: Internal Medicine

## 2022-11-07 NOTE — Telephone Encounter (Signed)
Per Dr. Zoila Shutter last office note, the patient did not want to continue with his oxygen.  We will need to do a walk test on him to qualify him.  I notified the patient and scheduled him an appt for 12/04/2022 at 10:30.  Nothing further needed.

## 2022-12-04 ENCOUNTER — Ambulatory Visit: Payer: PPO | Admitting: Internal Medicine

## 2022-12-04 ENCOUNTER — Encounter: Payer: Self-pay | Admitting: Internal Medicine

## 2022-12-04 VITALS — BP 132/80 | HR 79 | Temp 97.8°F | Ht 71.0 in | Wt 250.4 lb

## 2022-12-04 DIAGNOSIS — G4733 Obstructive sleep apnea (adult) (pediatric): Secondary | ICD-10-CM | POA: Diagnosis not present

## 2022-12-04 DIAGNOSIS — J449 Chronic obstructive pulmonary disease, unspecified: Secondary | ICD-10-CM | POA: Diagnosis not present

## 2022-12-04 NOTE — Progress Notes (Signed)
Pulmonary function testing shows moderate  Name: Cody Day MRN: [id number] DOB: 28-Jun-1956     CONSULTATION DATE: 12.9.19 REFERRING MD : Diona Browner  CHIEF COMPLAINT:   Follow-up COPD follow-up hypoxic respiratory failure  Follow-up OSA   STUDIES:     01/2021  CXR independently reviewed by Me today Low lung volumes No pneumonia   Synopsis: Cody Day first saw the [facility] pulmonary clinic in the spring of 2015 for shortness of breath, cough, mucus production. He had lung function testing in 2014 which was completely normal. He had a past history significant for long-term inhaled illicit drug use with multiple substances including methamphetamine/crack cocaine. Patient has smoked multiple substances in the past including crack marijuana smoking cigarettes Patient also has a history of alcohol abuse  Later PFT findings have shown airflow obstruction.  FEV1 67% 2019  Evidence of air trapping and hyperinflation    HISTORY OF PRESENT ILLNESS: Chronic shortness of breath and chronic dyspnea on exertion Moderate COPD on pulmonary function test Previous office visit patient refused to wear oxygen and refused to wear CPAP  No exacerbation at this time No evidence of heart failure at this time No evidence or signs of infection at this time No respiratory distress No fevers, chills, nausea, vomiting, diarrhea No evidence of lower extremity edema No evidence hemoptysis   Patient has sleep study and December 2023 which showed a severe sleep apnea with AHI of 64, CPAP titration study was performed at the same time which indicated a CPAP of 14 would be ideal situation Patient is at high risk for stroke heart failure respiratory compromise due to his noncompliance  PAST MEDICAL HISTORY :   has a past medical history of Achilles tendon rupture (10/11), Adenomatous polyp of colon (04/06/2010), Asthma, COPD (chronic obstructive pulmonary disease) ([place]), Gout, Hemorrhoids,  Hypercholesteremia, Hypertension, Sleep apnea, and Syncope (10/11).  has a past surgical history that includes treadmill stress test (2003); intestinal blockage; Abdominal hernia repair [id number]); Femoral hernia repair (2007); left heart cath (2008); KIDNEY STONE extraction; calcium kidney stones; Colonoscopy (2011); Hemorrhoid surgery; Hernia repair (09/10/2010); Hernia repair ( 02//28/2007); Hernia repair (11/01/1990  .); Hernia repair (01/17/1997); Hernia repair (01/07/1995 ); Cardiac catheterization; Colonoscopy with propofol (N/A, 08/30/2015); and RIGHT/LEFT HEART CATH AND CORONARY ANGIOGRAPHY (N/A, 01/13/2017). Prior to Admission medications   Medication Sig Start Date End Date Taking? Authorizing Provider  albuterol (PROVENTIL) (2.5 MG/3ML) 0.083% nebulizer solution Take 3 mLs (2.5 mg total) by nebulization every 4 (four) hours as needed for wheezing or shortness of breath. 04/17/18   Bedsole, Amy E, MD  allopurinol (ZYLOPRIM) 300 MG tablet Take 1 tablet (300 mg total) by mouth daily. 06/09/18   Bedsole, Amy E, MD  Cholecalciferol (VITAMIN D3) 50000 units CAPS Take 1 capsule by mouth once a week. 06/09/18   Bedsole, Amy E, MD  citalopram (CELEXA) 20 MG tablet Take 1 tablet (20 mg total) by mouth daily. 07/22/18 10/20/18  Gladstone Lighter, MD  fluticasone (FLOVENT HFA) 220 MCG/ACT inhaler Inhale 2 puffs into the lungs 2 (two) times daily.    [provider]  lisinopril (PRINIVIL,ZESTRIL) 20 MG tablet TAKE ONE (1) TABLET EACH DAY 06/12/18   Bedsole, Amy E, MD  montelukast (SINGULAIR) 10 MG tablet Take 1 tablet (10 mg total) by mouth daily. 06/09/18 06/09/19  [name], MD  Multiple Vitamin (MULTIVITAMIN WITH MINERALS) TABS tablet Take 1 tablet by mouth daily. 07/22/18   Gladstone Lighter, MD  PROAIR HFA 108 [phone] Base) MCG/ACT inhaler Use 2  inhalations every four to six hours as needed 09/14/18   Bedsole, Amy E, MD  simvastatin (ZOCOR) 20 MG tablet TAKE ONE TABLET BY MOUTH EVERY DAY 09/23/18    Bedsole, Amy E, MD  STIOLTO RESPIMAT 2.5-2.5 MCG/ACT AERS Use two inhalations every day 09/14/18   [name], MD  testosterone cypionate (DEPOTESTOTERONE CYPIONATE) 100 MG/ML injection Inject 1 mL (100 mg total) into the muscle once a week. 09/23/18   Stoioff, Ronda Fairly, MD   Allergies  Allergen Reactions   Levaquin [Levofloxacin In D5w]     Severe headaches, skin tingling/redness   Penicillins Hives and Rash    Has patient had a PCN reaction causing immediate rash, facial/tongue/throat swelling, SOB or lightheadedness with hypotension: Yes Has patient had a PCN reaction causing severe rash involving mucus membranes or skin necrosis: Unknown Has patient had a PCN reaction that required hospitalization: No Has patient had a PCN reaction occurring within the last 10 years: No - Childhood reaction If all of the above answers are "NO", then may proceed with Cephalosporin use.     SOCIAL HISTORY:  reports that he quit smoking about 27 years ago. His smoking use included cigarettes. He has a 8.75 pack-year smoking history. His smokeless tobacco use includes chew. He reports that he does not currently use alcohol. He reports that he does not use drugs.   BP 132/80 (BP Location: Left Arm, Cuff Size: Normal)   Pulse 79   Temp 97.8 F (36.6 C) (Temporal)   Ht 5' 11"$  (1.803 m)   Wt 250 lb 6.4 oz (113.6 kg)   SpO2 97%   BMI 34.92 kg/m      Review of Systems: Gen:  Denies  fever, sweats, chills weight loss  HEENT: Denies blurred vision, double vision, ear pain, eye pain, hearing loss, nose bleeds, sore throat Cardiac:  No dizziness, chest pain or heaviness, chest tightness,edema, No JVD Resp:   No cough, -sputum production, -shortness of breath,-wheezing, -hemoptysis,  Other:  All other systems negative    Physical Examination:   General Appearance: No distress  EYES PERRLA, EOM intact.   NECK Supple, No JVD Pulmonary: normal breath sounds, No wheezing.  CardiovascularNormal  S1,S2.  No m/r/g.   Abdomen: Benign, Soft, non-tender. ALL OTHER ROS ARE NEGATIVE       ASSESSMENT / PLAN:  67 year old man seen today for follow-up persistent chronic shortness of breath with moderate COPD with severe deconditioned state with history of peripheral artery disease and vertigo with chronic hypoxic respiratory failure with sleep apnea who is noncompliant with oxygen therapy and CPAP therapy   Moderate COPD and deconditioned state  Recommend restart Dulera therapy  Continue albuterol as needed  Avoid secondhand smoke Avoid sick contacts No signs of exacerbation at this time No indication for steroids or antibiotics at this time  Chronic Hypoxic resp failure due to COPD Patient is noncompliant with oxygen therapy 6-minute walk test completed no evidence of exertional hypoxia Patient is noncompliant with CPAP therapy  Obesity -recommend significant weight loss -recommend changing diet  Deconditioned state -Recommend increased daily activity and exercise   PAD-follow up vascular surgery  OSA-non-complaint with CPAP  Follow-up with neurology referral placed October 2023    MEDICATION ADJUSTMENTS/LABS AND TESTS ORDERED: Continue albuterol as needed  Recommend using CPAP and oxygen therapy as previously prescribed You are at a high risk for heart failure and a high risk for stroke if you do not use your oxygen therapy or CPAP machine  You  have a significant reduction in your lung function  Avoid secondhand smoke Please restart Dulera   CURRENT MEDICATIONS REVIEWED AT LENGTH WITH PATIENT TODAY   Patient satisfied with Plan of action and management. All questions answered  Follow-up in 1 year   Total time spent 22 minutes    Terance Pomplun Patricia Pesa, M.D.  [facility]  Medical Director [place] Director [facility] Cardio-Pulmonary Department

## 2022-12-04 NOTE — Patient Instructions (Signed)
Please restart Dulera as prescribed Follow-up with cardiology on Friday Referral made to neurologist in October 2023  Avoid secondhand smoke Avoid SICK contacts Recommend  Masking  when appropriate Recommend Keep up-to-date with vaccinations

## 2022-12-06 ENCOUNTER — Ambulatory Visit: Payer: PPO | Attending: Cardiovascular Disease | Admitting: Cardiovascular Disease

## 2022-12-06 ENCOUNTER — Encounter: Payer: Self-pay | Admitting: Cardiovascular Disease

## 2022-12-06 VITALS — BP 150/90 | HR 79 | Ht 71.0 in | Wt 249.2 lb

## 2022-12-06 DIAGNOSIS — I10 Essential (primary) hypertension: Secondary | ICD-10-CM

## 2022-12-06 DIAGNOSIS — E785 Hyperlipidemia, unspecified: Secondary | ICD-10-CM | POA: Diagnosis not present

## 2022-12-06 DIAGNOSIS — I2510 Atherosclerotic heart disease of native coronary artery without angina pectoris: Secondary | ICD-10-CM

## 2022-12-06 MED ORDER — AMLODIPINE BESYLATE 2.5 MG PO TABS: 2.5000 mg | ORAL_TABLET | Freq: Every day | ORAL | 3 refills | Status: DC

## 2022-12-06 NOTE — Progress Notes (Signed)
Cardiology Office Note   Date:  12/06/2022   ID:  Cody Day, Cody Day December 26, 1955, MRN [id number]  PCP:  [name], MD  Cardiologist:   Kathlyn Sacramento, MD   Chief Complaint  Patient presents with   Other    Post testing c/o sob and fatigue sleeping all the time. Meds reviewed verbally with pt.      History of Present Illness: Cody Day is a 67 y.o. male who is here today for follow-up visit regarding mild nonobstructive coronary artery disease.   The patient has known history of asthma/COPD, anxiety, sleep apnea, obesity, essential hypertension, hyperlipidemia and previous tobacco and drug use.  He reports inhaling different kind of drugs that might have affected his lungs.  He had extensive drug use from the age of 17 to 68 years old. He was seen by me in 2018 for exertional dyspnea and possible cardiomyopathy.  I proceeded with a right and left cardiac catheterization in March 2018 which showed mild nonobstructive coronary artery disease, normal LV systolic function and normal right heart catheterization.  He was referred recently due to severe exertional dyspnea.  He had an echocardiogram done which showed normal LV systolic function with grade 1 diastolic dysfunction and no significant valvular abnormalities.  Lexiscan Myoview showed small inferior apical defect likely artifact but could not rule out a small scar.  Overall low risk study.  CT attenuation images did show evidence of mild coronary artery calcifications.   Past Medical History:  Diagnosis Date   Achilles tendon rupture 10/11   and repair   Adenomatous polyp of colon 04/06/2010   Adenomatous polyps 3 in the descending colon, times one in the ascending colon [facility])   Asthma    COPD (chronic obstructive pulmonary disease) (H[facility])    Gout    Hemorrhoids    Hypercholesteremia    Hypertension    Sleep apnea    doesn't wear CPAP   Syncope 10/11   in settin gof asthma exacerbation w coughing. Ech  (10/11): EF > 55%, mild LVH, grade I diastolic dysfunction, nomral RV size and systolic function,. normal valves. Carotid US (10/11): minimal disease    Past Surgical History:  Procedure Laterality Date   ABDOMINAL HERNIA REPAIR  1998,2000   calcium kidney stones     [facility]    COLONOSCOPY  2011   [facility], [name], [place]; Dr. [name]. Multiple adenomatous polyps (total 4).    COLONOSCOPY WITH PROPOFOL N/A 08/30/2015   Procedure: COLONOSCOPY WITH PROPOFOL;  Surgeon: [name], MD;  Location: [facility] ENDOSCOPY;  Service: Endoscopy;  Laterality: N/A;   FEMORAL HERNIA REPAIR  2007   HEMORRHOID SURGERY     HERNIA REPAIR  09/10/2010   Repair of recurrent ventral hernia, resection of previously placed mesh x2, implantation 7.5 x 10 cm Gore-Tex dual mesh in an underlay position, repair of epigastric hernia with a 4.2 cm Proceed ventral patch.   HERNIA REPAIR   02//28/2007   Laparoscopic right inguinal hernia repair with Surgipro mesh, Ventrilex patch at umbilicus   HERNIA REPAIR  11/01/1990  .   Small oval Kugel patch placed in preperitoneal space, [name], MD   HERNIA REPAIR  01/17/1997   Recurrent ventral hernia 15 x 19 Gore-Tex dual mesh placed laparoscopically with multiple lefft--sided ports, [name], MD   HERNIA REPAIR  01/07/1995    Primary repair of ventral hernia. [name], MD   intestinal  blockage     as a child   KIDNEY STONE extraction     unic acid stones   left heart cath  2008   minimal luminal irregularities EF 55%   RIGHT/LEFT HEART CATH AND CORONARY ANGIOGRAPHY N/A 01/13/2017   Procedure: Right/Left Heart Cath and Coronary Angiography;  Surgeon: [name], MD;  Location: [facility];  Service: Cardiovascular;  Laterality: N/A;   treadmill stress test  2003     Current Outpatient Medications  Medication Sig Dispense Refill   albuterol (PROVENTIL) (2.5 MG/3ML) 0.083% nebulizer solution Take 3  mLs (2.5 mg total) by nebulization every 6 (six) hours as needed for wheezing or shortness of breath. 75 mL 3   allopurinol (ZYLOPRIM) 300 MG tablet TAKE 1 TABLET BY MOUTH DAILY 90 tablet 3   amLODipine (NORVASC) 2.5 MG tablet Take 1 tablet (2.5 mg total) by mouth daily. 90 tablet 3   citalopram (CELEXA) 40 MG tablet TAKE 1 TABLET BY MOUTH DAILY 90 tablet 1   lisinopril (ZESTRIL) 40 MG tablet Take 1 tablet (40 mg total) by mouth daily. 90 tablet 3   mometasone-formoterol (DULERA) 200-5 MCG/ACT AERO Inhale 2 puffs into the lungs in the morning and at bedtime. 1 each 5   montelukast (SINGULAIR) 10 MG tablet TAKE 1 TABLET BY MOUTH DAILY 90 tablet 3   Multiple Vitamin (MULTIVITAMIN WITH MINERALS) TABS tablet Take 1 tablet by mouth daily. 30 tablet 2   simvastatin (ZOCOR) 20 MG tablet TAKE 1 TABLET BY MOUTH DAILY 90 tablet 3   testosterone cypionate (DEPOTESTOSTERONE CYPIONATE) 200 MG/ML injection INJECT 0.3 ML INTO THE MUSCLE ONCE WEEKLY 10 mL 0   VENTOLIN HFA 108 (90 Base) MCG/ACT inhaler INHALE 2 PUFFS INTO THE LUNGS EVERY 6 HOURS AS NEEDED FOR WHEEZING OR SHORTNESS OF BREATH 18 g 2   No current facility-administered medications for this visit.    Allergies:   Levaquin [levofloxacin in d5w] and Penicillins    Social History:  The patient  reports that he quit smoking about 27 years ago. His smoking use included cigarettes. He has a 8.75 pack-year smoking history. His smokeless tobacco use includes chew. He reports that he does not currently use alcohol. He reports that he does not use drugs.   Family History:  The patient's family history includes Cataracts in his father; Colon cancer in his brother and maternal aunt; Coronary artery disease in his mother; Dementia in his mother; Depression in his sister; Diabetes in his father and another family member; Heart attack (age of onset: 66) in his mother; Hypertension in his brother, father, and sister.    ROS:  Please see the history of present  illness.   Otherwise, review of systems are positive for none.   All other systems are reviewed and negative.    PHYSICAL EXAM: VS:  BP (!) 150/90 (BP Location: Left Arm, Patient Position: Sitting)   Pulse 79   Ht 5' 11"$  (1.803 m)   Wt 249 lb 4 oz (113.1 kg)   SpO2 98%   BMI 34.76 kg/m  , BMI Body mass index is 34.76 kg/m. GEN: Well nourished, well developed, in no acute distress  HEENT: normal  Neck: no JVD, carotid bruits, or masses Cardiac: RRR; no murmurs, rubs, or gallops,no edema  Respiratory:  clear to auscultation bilaterally with mildly diminished breath sounds GI: soft, nontender, nondistended, + BS MS: no deformity or atrophy  Skin: warm and dry, no rash Neuro:  Strength and sensation are intact  Psych: euthymic mood, full affect   EKG:  EKG is not ordered today.   Recent Labs: 03/22/2022: TSH 2.01 09/24/2022: ALT 17; BUN 6; Creatinine, Ser 0.77; Potassium 4.3; Sodium 137    Lipid Panel    Component Value Date/Time   CHOL 126 09/24/2022 0751   TRIG 86.0 09/24/2022 0751   HDL 57.10 09/24/2022 0751   CHOLHDL 2 09/24/2022 0751   VLDL 17.2 09/24/2022 0751   LDLCALC 51 09/24/2022 0751   LDLDIRECT 53.0 08/23/2016 0857      Wt Readings from Last 3 Encounters:  12/06/22 249 lb 4 oz (113.1 kg)  12/04/22 250 lb 6.4 oz (113.6 kg)  11/05/22 248 lb 12.8 oz (112.9 kg)           02/03/2020    8:46 AM  PAD Screen  Previous PAD dx? No  Previous surgical procedure? No  Pain with walking? No  Feet/toe relief with dangling? No  Painful, non-healing ulcers? No  Extremities discolored? No      ASSESSMENT AND PLAN:  1.  Exertional dyspnea with associated chest tightness and frequent wheezing: His cardiac workup was unremarkable overall and I still think that his symptoms are most related to his underlying lung disease.    2.  Essential hypertension: Blood pressure is elevated this was confirmed by me with manual measurement.  I elected to add amlodipine 2.5 mg  once daily.  Continue lisinopril.  3.  Hyperlipidemia: Currently on simvastatin 20 mg daily.  I reviewed most recent lipid profile which showed an LDL of 51.  4.  Mild nonobstructive coronary artery disease: He does have evidence of mild coronary artery calcifications on CT images.  Recommend treatment of risk factors.  He already takes aspirin daily and his LDL is optimal.  We need to control his blood pressure and I added amlodipine as outlined above.  5.  Obstructive sleep apnea: Reported noncompliance with CPAP.   Disposition:   FU in 6 months.  Signed,  Kathlyn Sacramento, MD  12/06/2022 4:27 PM    [place]

## 2022-12-06 NOTE — Patient Instructions (Signed)
Medication Instructions:  START Amlodipine 2.5 mg once daily *If you need a refill on your cardiac medications before your next appointment, please call your pharmacy*   Lab Work: None ordered If you have labs (blood work) drawn today and your tests are completely normal, you will receive your results only by: [employer] (if you have MyChart) OR A paper copy in the mail If you have any lab test that is abnormal or we need to change your treatment, we will call you to review the results.   Testing/Procedures: None ordered   Follow-Up: At [facility], you and your health needs are our priority.  As part of our continuing mission to provide you with exceptional heart care, we have created designated Provider Care Teams.  These Care Teams include your primary Cardiologist (physician) and Advanced Practice Providers (APPs -  Physician Assistants and Nurse Practitioners) who all work together to provide you with the care you need, when you need it.  We recommend signing up for the patient portal called "MyChart".  Sign up information is provided on this After Visit Summary.  MyChart is used to connect with patients for Virtual Visits (Telemedicine).  Patients are able to view lab/test results, encounter notes, upcoming appointments, etc.  Non-urgent messages can be sent to your provider as well.   To learn more about what you can do with MyChart, go to [email or web address].    Your next appointment:   6 month(s)  Provider:   You may see Kathlyn Sacramento, MD or one of the following Advanced Practice Providers on your designated Care Team:   Murray Hodgkins, NP Christell Faith, PA-C Cadence Kathlen Mody, PA-C Gerrie Nordmann, NP

## 2022-12-24 ENCOUNTER — Other Ambulatory Visit: Payer: Self-pay | Admitting: *Deleted

## 2022-12-24 MED ORDER — CITALOPRAM HYDROBROMIDE 40 MG PO TABS: 40.0000 mg | ORAL_TABLET | Freq: Every day | ORAL | 0 refills | Status: DC

## 2022-12-24 MED ORDER — LISINOPRIL 40 MG PO TABS: 40.0000 mg | ORAL_TABLET | Freq: Every day | ORAL | 0 refills | Status: DC

## 2022-12-30 ENCOUNTER — Other Ambulatory Visit (HOSPITAL_COMMUNITY): Payer: Self-pay

## 2022-12-30 ENCOUNTER — Telehealth: Payer: Self-pay

## 2022-12-30 NOTE — Telephone Encounter (Signed)
PA request received via CMM for Dulera 200-5MCG/ACT aerosol  PA has been submitted to [place] [employer] and is pending determination.   Key: [id number]

## 2023-01-21 NOTE — Telephone Encounter (Signed)
Pa has been cancelled due to a previous denial for this medication

## 2023-02-28 ENCOUNTER — Other Ambulatory Visit: Payer: PPO

## 2023-02-28 DIAGNOSIS — Z125 Encounter for screening for malignant neoplasm of prostate: Secondary | ICD-10-CM

## 2023-02-28 DIAGNOSIS — E291 Testicular hypofunction: Secondary | ICD-10-CM

## 2023-02-28 DIAGNOSIS — R972 Elevated prostate specific antigen [PSA]: Secondary | ICD-10-CM | POA: Diagnosis not present

## 2023-03-01 LAB — HEMATOCRIT: Hematocrit: 44.7 % (ref 37.5–51.0)

## 2023-03-01 LAB — TESTOSTERONE: Testosterone: 227 ng/dL — ABNORMAL LOW (ref 264–916)

## 2023-03-01 LAB — PSA: Prostate Specific Ag, Serum: 0.3 ng/mL (ref 0.0–4.0)

## 2023-03-03 ENCOUNTER — Ambulatory Visit (INDEPENDENT_AMBULATORY_CARE_PROVIDER_SITE_OTHER): Payer: PPO | Admitting: Urology

## 2023-03-03 ENCOUNTER — Encounter: Payer: Self-pay | Admitting: Urology

## 2023-03-03 VITALS — BP 168/71 | HR 89 | Ht 71.0 in | Wt 246.0 lb

## 2023-03-03 DIAGNOSIS — E291 Testicular hypofunction: Secondary | ICD-10-CM

## 2023-03-03 NOTE — Progress Notes (Signed)
I, Cody Day,acting as a scribe for Cody Altes, MD.,have documented all relevant documentation on the behalf of Cody Altes, MD,as directed by  Cody Altes, MD while in the presence of Cody Altes, MD.  03/03/2023 11:59 AM   [name] 02/07/1956 [id number]  Referring provider: Excell Seltzer, MD [address] [place],  [place] [address]  Chief Complaint  Patient presents with   Hypogonadism    Urologic history: 1.  Hypogonadism MR fusion biopsy 07/2019 for abnormal PSA velocity (PI-RADS 3 lesion), benign pathology  HPI: Cody Day is a 67 y.o. male here for annual follow up of hypogonadism.  Has not performed testosterone injections since April due to tremor. Labs 02/28/23 testosterone 227, PSA 0.3, hematocrit 44.7. He has no complaints. Mild lower urinary tract symptoms.  PSA trend  Prostate Specific Ag, Serum  Latest Ref Rng 0.0 - 4.0 ng/mL  11/04/2018 0.4   03/16/2019 1.5   04/29/2019 1.8   02/18/2020 0.3   02/21/2021 0.7   02/22/2022 0.5   02/28/2023 0.3     PMH: Past Medical History:  Diagnosis Date   Achilles tendon rupture 10/11   and repair   Adenomatous polyp of colon 04/06/2010   Adenomatous polyps 3 in the descending colon, times one in the ascending colon [facility])   Asthma    COPD (chronic obstructive pulmonary disease) (HCC)    Gout    Hemorrhoids    Hypercholesteremia    Hypertension    Sleep apnea    doesn't wear CPAP   Syncope 10/11   in settin gof asthma exacerbation w coughing. Ech (10/11): EF > 55%, mild LVH, grade I diastolic dysfunction, nomral RV size and systolic function,. normal valves. Carotid US (10/11): minimal disease    Surgical History: Past Surgical History:  Procedure Laterality Date   ABDOMINAL HERNIA REPAIR  1998,2000   calcium kidney stones     CARDIAC CATHETERIZATION     [facility]    COLONOSCOPY  2011   [facility], [place], [place]; Dr. Tiburcio Pea. Multiple  adenomatous polyps (total 4).    COLONOSCOPY WITH PROPOFOL N/A 08/30/2015   Procedure: COLONOSCOPY WITH PROPOFOL;  Surgeon: Earline Mayotte, MD;  Location: [facility] ENDOSCOPY;  Service: Endoscopy;  Laterality: N/A;   FEMORAL HERNIA REPAIR  2007   HEMORRHOID SURGERY     HERNIA REPAIR  09/10/2010   Repair of recurrent ventral hernia, resection of previously placed mesh x2, implantation 7.5 x 10 cm Gore-Tex dual mesh in an underlay position, repair of epigastric hernia with a 4.2 cm Proceed ventral patch.   HERNIA REPAIR   02//28/2007   Laparoscopic right inguinal hernia repair with Surgipro mesh, Ventrilex patch at umbilicus   HERNIA REPAIR  11/01/1990  .   Small oval Kugel patch placed in preperitoneal space, Marshia Ly, MD   HERNIA REPAIR  01/17/1997   Recurrent ventral hernia 15 x 19 Gore-Tex dual mesh placed laparoscopically with multiple lefft--sided ports, Marshia Ly, MD   HERNIA REPAIR  01/07/1995    Primary repair of ventral hernia. Marshia Ly, MD   intestinal blockage     as a child   KIDNEY STONE extraction     unic acid stones   left heart cath  2008   minimal luminal irregularities EF 55%   RIGHT/LEFT HEART CATH AND CORONARY ANGIOGRAPHY N/A 01/13/2017   Procedure: Right/Left Heart Cath and Coronary Angiography;  Surgeon: Iran Ouch, MD;  Location:  [facility] INVASIVE CV LAB;  Service: Cardiovascular;  Laterality: N/A;   treadmill stress test  2003    Home Medications:  Allergies as of 03/03/2023       Reactions   Levaquin [levofloxacin In D5w]    Severe headaches, skin tingling/redness   Penicillins Hives, Rash   Has patient had a PCN reaction causing immediate rash, facial/tongue/throat swelling, SOB or lightheadedness with hypotension: Yes Has patient had a PCN reaction causing severe rash involving mucus membranes or skin necrosis: Unknown Has patient had a PCN reaction that required hospitalization: No Has patient had a PCN reaction occurring within the last 10 years: No -  Childhood reaction If all of the above answers are "NO", then may proceed with Cephalosporin use.        Medication List        Accurate as of Mar 03, 2023 11:59 AM. If you have any questions, ask your nurse or doctor.          allopurinol 300 MG tablet Commonly known as: ZYLOPRIM TAKE 1 TABLET BY MOUTH DAILY   amLODipine 2.5 MG tablet Commonly known as: NORVASC Take 1 tablet (2.5 mg total) by mouth daily.   citalopram 40 MG tablet Commonly known as: CELEXA Take 1 tablet (40 mg total) by mouth daily.   lisinopril 40 MG tablet Commonly known as: ZESTRIL Take 1 tablet (40 mg total) by mouth daily.   mometasone-formoterol 200-5 MCG/ACT Aero Commonly known as: DULERA Inhale 2 puffs into the lungs in the morning and at bedtime.   montelukast 10 MG tablet Commonly known as: SINGULAIR TAKE 1 TABLET BY MOUTH DAILY   multivitamin with minerals Tabs tablet Take 1 tablet by mouth daily.   simvastatin 20 MG tablet Commonly known as: ZOCOR TAKE 1 TABLET BY MOUTH DAILY   testosterone cypionate 200 MG/ML injection Commonly known as: DEPOTESTOSTERONE CYPIONATE INJECT 0.3 ML INTO THE MUSCLE ONCE WEEKLY   Ventolin HFA 108 (90 Base) MCG/ACT inhaler Generic drug: albuterol INHALE 2 PUFFS INTO THE LUNGS EVERY 6 HOURS AS NEEDED FOR WHEEZING OR SHORTNESS OF BREATH   albuterol (2.5 MG/3ML) 0.083% nebulizer solution Commonly known as: PROVENTIL Take 3 mLs (2.5 mg total) by nebulization every 6 (six) hours as needed for wheezing or shortness of breath.        Allergies:  Allergies  Allergen Reactions   Levaquin [Levofloxacin In D5w]     Severe headaches, skin tingling/redness   Penicillins Hives and Rash    Has patient had a PCN reaction causing immediate rash, facial/tongue/throat swelling, SOB or lightheadedness with hypotension: Yes Has patient had a PCN reaction causing severe rash involving mucus membranes or skin necrosis: Unknown Has patient had a PCN reaction that  required hospitalization: No Has patient had a PCN reaction occurring within the last 10 years: No - Childhood reaction If all of the above answers are "NO", then may proceed with Cephalosporin use.     Family History: Family History  Problem Relation Age of Onset   Hypertension Father    Cataracts Father    Diabetes Father    Heart attack Mother 65   Coronary artery disease Mother    Dementia Mother    Hypertension Sister    Depression Sister    Hypertension Brother    Colon cancer Brother    Diabetes Other        1st degree relative   Colon cancer Maternal Aunt     Social History:  reports that he quit smoking  about 27 years ago. His smoking use included cigarettes. He has a 8.75 pack-year smoking history. His smokeless tobacco use includes chew. He reports that he does not currently use alcohol. He reports that he does not use drugs.   Physical Exam: BP (!) 168/71 (BP Location: Left Arm, Patient Position: Sitting, Cuff Size: Large)   Pulse 89   Ht 5\' 11"  (1.803 m)   Wt 246 lb (111.6 kg)   BMI 34.31 kg/m   Constitutional:  Alert and oriented, No acute distress. HEENT:  AT Respiratory: Normal respiratory effort, no increased work of breathing.Marland Kitchen Psychiatric: Normal mood and affect.   Assessment & Plan:    1. Hypogonadism We discussed other options including receiving injections in office or switching to a testosterone gel. He states he is able to manage, especially since he was given a 10cc vial by his pharmacy. It makes it much easier to withdraw. Lab visit 6 months testosterone/hematocrit. 1 year follow up office visit with testosterone, PSA, and hematocrit.  I have reviewed the above documentation for accuracy and completeness, and I agree with the above.   Cody Altes, MD  [facility] [address] [place], [place] [address] [phone]

## 2023-03-06 ENCOUNTER — Telehealth: Payer: Self-pay | Admitting: *Deleted

## 2023-03-06 ENCOUNTER — Encounter: Payer: Self-pay | Admitting: Urology

## 2023-03-06 DIAGNOSIS — E559 Vitamin D deficiency, unspecified: Secondary | ICD-10-CM

## 2023-03-06 DIAGNOSIS — E1169 Type 2 diabetes mellitus with other specified complication: Secondary | ICD-10-CM

## 2023-03-06 DIAGNOSIS — M1A9XX0 Chronic gout, unspecified, without tophus (tophi): Secondary | ICD-10-CM

## 2023-03-06 DIAGNOSIS — E1159 Type 2 diabetes mellitus with other circulatory complications: Secondary | ICD-10-CM

## 2023-03-06 NOTE — Telephone Encounter (Signed)
-----   Message from Terri J Walsh sent at 03/05/2023  3:34 PM EDT ----- Regarding: Lab orders for Wednesday, 5.29.24 Patient is scheduled for CPX labs, please order future labs, Thanks , Terri   

## 2023-03-06 NOTE — Telephone Encounter (Signed)
-----   Message from Alvina Chou sent at 03/05/2023  3:34 PM EDT ----- Regarding: Lab orders for Wednesday, 5.29.24 Patient is scheduled for CPX labs, please order future labs, Thanks , Camelia Eng

## 2023-03-19 ENCOUNTER — Other Ambulatory Visit (INDEPENDENT_AMBULATORY_CARE_PROVIDER_SITE_OTHER): Payer: PPO

## 2023-03-19 DIAGNOSIS — E1159 Type 2 diabetes mellitus with other circulatory complications: Secondary | ICD-10-CM

## 2023-03-19 DIAGNOSIS — E1169 Type 2 diabetes mellitus with other specified complication: Secondary | ICD-10-CM | POA: Diagnosis not present

## 2023-03-19 DIAGNOSIS — E559 Vitamin D deficiency, unspecified: Secondary | ICD-10-CM | POA: Diagnosis not present

## 2023-03-19 DIAGNOSIS — E785 Hyperlipidemia, unspecified: Secondary | ICD-10-CM | POA: Diagnosis not present

## 2023-03-19 LAB — LIPID PANEL
Cholesterol: 128 mg/dL (ref 0–200)
HDL: 47 mg/dL (ref >39.00)
LDL Cholesterol: 51 mg/dL (ref 0–99)
NonHDL: 81.28
Total CHOL/HDL Ratio: 3
Triglycerides: 152 mg/dL — ABNORMAL HIGH (ref 0.0–149.0)
VLDL: 30.4 mg/dL (ref 0.0–40.0)

## 2023-03-19 LAB — COMPREHENSIVE METABOLIC PANEL
ALT: 17 U/L (ref 0–53)
AST: 19 U/L (ref 0–37)
Albumin: 4.3 g/dL (ref 3.5–5.2)
Alkaline Phosphatase: 60 U/L (ref 39–117)
BUN: 7 mg/dL (ref 6–23)
CO2: 31 mEq/L (ref 19–32)
Calcium: 9.2 mg/dL (ref 8.4–10.5)
Chloride: 98 mEq/L (ref 96–112)
Creatinine, Ser: 0.78 mg/dL (ref 0.40–1.50)
GFR: 93.04 mL/min (ref >60.00)
Glucose, Bld: 100 mg/dL — ABNORMAL HIGH (ref 70–99)
Potassium: 4.2 mEq/L (ref 3.5–5.1)
Sodium: 136 mEq/L (ref 135–145)
Total Bilirubin: 1 mg/dL (ref 0.2–1.2)
Total Protein: 7 g/dL (ref 6.0–8.3)

## 2023-03-19 LAB — VITAMIN D 25 HYDROXY (VIT D DEFICIENCY, FRACTURES): VITD: 47.45 ng/mL (ref 30.00–100.00)

## 2023-03-19 LAB — HEMOGLOBIN A1C: Hgb A1c MFr Bld: 6.6 % — ABNORMAL HIGH (ref 4.6–6.5)

## 2023-03-19 NOTE — Progress Notes (Signed)
No critical labs need to be addressed urgently. We will discuss labs in detail at upcoming office visit.   

## 2023-03-25 ENCOUNTER — Other Ambulatory Visit: Payer: Self-pay | Admitting: Family Medicine

## 2023-03-26 ENCOUNTER — Ambulatory Visit (INDEPENDENT_AMBULATORY_CARE_PROVIDER_SITE_OTHER): Payer: PPO | Admitting: Family Medicine

## 2023-03-26 ENCOUNTER — Encounter: Payer: Self-pay | Admitting: Family Medicine

## 2023-03-26 VITALS — BP 138/82 | HR 74 | Temp 98.2°F | Ht 71.0 in | Wt 247.0 lb

## 2023-03-26 DIAGNOSIS — E1169 Type 2 diabetes mellitus with other specified complication: Secondary | ICD-10-CM

## 2023-03-26 DIAGNOSIS — R7989 Other specified abnormal findings of blood chemistry: Secondary | ICD-10-CM

## 2023-03-26 DIAGNOSIS — J42 Unspecified chronic bronchitis: Secondary | ICD-10-CM

## 2023-03-26 DIAGNOSIS — M546 Pain in thoracic spine: Secondary | ICD-10-CM | POA: Insufficient documentation

## 2023-03-26 DIAGNOSIS — E1159 Type 2 diabetes mellitus with other circulatory complications: Secondary | ICD-10-CM

## 2023-03-26 DIAGNOSIS — E785 Hyperlipidemia, unspecified: Secondary | ICD-10-CM

## 2023-03-26 DIAGNOSIS — F321 Major depressive disorder, single episode, moderate: Secondary | ICD-10-CM | POA: Diagnosis not present

## 2023-03-26 DIAGNOSIS — F1911 Other psychoactive substance abuse, in remission: Secondary | ICD-10-CM | POA: Diagnosis not present

## 2023-03-26 DIAGNOSIS — I152 Hypertension secondary to endocrine disorders: Secondary | ICD-10-CM

## 2023-03-26 DIAGNOSIS — Z0000 Encounter for general adult medical examination without abnormal findings: Secondary | ICD-10-CM

## 2023-03-26 LAB — HM DIABETES FOOT EXAM

## 2023-03-26 NOTE — Assessment & Plan Note (Signed)
Chronic, stable  Citalopram 40 mg p.o. daily

## 2023-03-26 NOTE — Assessment & Plan Note (Addendum)
Stable, chronic.  Continue current medication.   lisinopril 40 mg daily.  Amlodipine 2.5 mg daily

## 2023-03-26 NOTE — Assessment & Plan Note (Signed)
Stable, chronic.  Continue current medication.  LDL at goal less than 100 on simvastatin 20 mg daily 

## 2023-03-26 NOTE — Progress Notes (Signed)
Patient ID: Cody Day, male    DOB: [birth year], 67 y.o.   MRN: [id number]  This visit was conducted in person.  BP 138/82 (BP Location: Left Arm, Patient Position: Sitting, Cuff Size: Normal)   Pulse 74   Temp 98.2 F (36.8 C) (Temporal)   Ht 5\' 11"  (1.803 m)   Wt 247 lb (112 kg)   SpO2 97%   BMI 34.45 kg/m    CC:  Chief Complaint  Patient presents with   Medicare Wellness    Subjective:   HPI: Cody Day is a 67 y.o. male presenting on 03/26/2023 for Medicare Wellness  The patient presents for annual medicare wellness, complete physical and review of chronic health problems. He/She also has the following acute concerns today: none  I have personally reviewed the Medicare Annual Wellness questionnaire and have noted 1. The patient's medical and social history 2. Their use of alcohol, tobacco or illicit drugs 3. Their current medications and supplements 4. The patient's functional ability including ADL's, fall risks, home safety risks and hearing or visual             impairment. 5. Diet and physical activities 6. Evidence for depression or mood disorders 7.         Updated provider list Cognitive evaluation was performed and recorded on pt medicare questionnaire form. The patients weight, height, BMI and visual acuity have been recorded in the chart   I have made referrals, counseling and provided education to the patient based review of the above and I have provided the pt with a written personalized care plan for preventive services.   Documentation of this information was scanned into the electronic record under the media tab.   Advance directives and end of life planning reviewed in detail with patient and documented in EMR. Patient given handout on advance care directives if needed. HCPOA and living will updated if needed.  No falls in last 12 months.  Hearing Screening   500Hz  1000Hz  2000Hz  4000Hz   Right ear 25 25 20  0  Left ear 25 25 40 0   Vision  Screening   Right eye Left eye Both eyes  Without correction 20/50 20/15 20/15   With correction      Flowsheet Row Office Visit from 03/26/2023 in [facility]  PHQ-2 Total Score 0      Diabetes:   Diet controlled. Lab Results  Component Value Date   HGBA1C 6.6 (H) 03/19/2023  Using medications without difficulties: Hypoglycemic episodes: Hyperglycemic episodes: Feet problems: foot exam due.. no issues Blood Sugars averaging: not checking. eye exam within last year: due  Wt Readings from Last 3 Encounters:  03/26/23 247 lb (112 kg)  03/03/23 246 lb (111.6 kg)  12/06/22 249 lb 4 oz (113.1 kg)     ECHO 2021 EF 50-55%, diastolic dysfunction   COPD followed by pulmonary.. he says that [facility] no longer covered by insurance.  Hypertension:    Good control..  Lisinopril 40 mg daily.Marland Kitchen at home he reports BP Readings from Last 3 Encounters:  03/26/23 138/82  03/03/23 (!) 168/71  12/06/22 (!) 150/90  Using medication without problems or lightheadedness:  none Chest pain with exertion: none Edema:none Short of breath: none Average home BPs: Other issues:  Doing 2 miles on exercise bike 2 times a week.    High cholesterol.. LDL at goal < 70 on   simvastatin 20 mg daily  Relevant past medical, surgical, family and  social history reviewed and updated as indicated. Interim medical history since our last visit reviewed. Allergies and medications reviewed and updated. Outpatient Medications Prior to Visit  Medication Sig Dispense Refill   allopurinol (ZYLOPRIM) 300 MG tablet TAKE 1 TABLET BY MOUTH DAILY 90 tablet 3   amLODipine (NORVASC) 2.5 MG tablet Take 1 tablet (2.5 mg total) by mouth daily. 90 tablet 3   citalopram (CELEXA) 40 MG tablet Take 1 tablet (40 mg total) by mouth daily. 90 tablet 0   lisinopril (ZESTRIL) 40 MG tablet Take 1 tablet (40 mg total) by mouth daily. 90 tablet 0   montelukast (SINGULAIR) 10 MG tablet TAKE 1 TABLET BY MOUTH  DAILY 90 tablet 3   Multiple Vitamin (MULTIVITAMIN WITH MINERALS) TABS tablet Take 1 tablet by mouth daily. 30 tablet 2   simvastatin (ZOCOR) 20 MG tablet TAKE 1 TABLET BY MOUTH DAILY 90 tablet 3   testosterone cypionate (DEPOTESTOSTERONE CYPIONATE) 200 MG/ML injection INJECT 0.3 ML INTO THE MUSCLE ONCE WEEKLY 10 mL 0   VENTOLIN HFA 108 (90 Base) MCG/ACT inhaler INHALE 2 PUFFS INTO THE LUNGS EVERY 6 HOURS AS NEEDED FOR WHEEZING OR SHORTNESS OF BREATH 18 g 2   albuterol (PROVENTIL) (2.5 MG/3ML) 0.083% nebulizer solution Take 3 mLs (2.5 mg total) by nebulization every 6 (six) hours as needed for wheezing or shortness of breath. 75 mL 3   mometasone-formoterol (DULERA) 200-5 MCG/ACT AERO Inhale 2 puffs into the lungs in the morning and at bedtime. 1 each 5   No facility-administered medications prior to visit.     Per HPI unless specifically indicated in ROS section below Review of Systems  Constitutional:  Negative for fatigue and fever.  HENT:  Negative for ear pain.   Eyes:  Negative for pain.  Respiratory:  Positive for cough, shortness of breath and wheezing.   Cardiovascular:  Negative for chest pain, palpitations and leg swelling.  Gastrointestinal:  Negative for abdominal pain.  Genitourinary:  Negative for dysuria.  Musculoskeletal:  Negative for arthralgias.  Neurological:  Negative for syncope, light-headedness and headaches.  Psychiatric/Behavioral:  Negative for dysphoric mood.    Objective:  BP 138/82 (BP Location: Left Arm, Patient Position: Sitting, Cuff Size: Normal)   Pulse 74   Temp 98.2 F (36.8 C) (Temporal)   Ht 5\' 11"  (1.803 m)   Wt 247 lb (112 kg)   SpO2 97%   BMI 34.45 kg/m   Wt Readings from Last 3 Encounters:  03/26/23 247 lb (112 kg)  03/03/23 246 lb (111.6 kg)  12/06/22 249 lb 4 oz (113.1 kg)      Physical Exam Constitutional:      General: He is not in acute distress.    Appearance: Normal appearance. He is well-developed. He is not ill-appearing  or toxic-appearing.  HENT:     Head: Normocephalic and atraumatic.     Right Ear: Hearing, tympanic membrane, ear canal and external ear normal.     Left Ear: Hearing, tympanic membrane, ear canal and external ear normal.     Nose: Nose normal.     Mouth/Throat:     Pharynx: Uvula midline.  Eyes:     General: Lids are normal. Lids are everted, no foreign bodies appreciated.     Conjunctiva/sclera: Conjunctivae normal.     Pupils: Pupils are equal, round, and reactive to light.  Neck:     Thyroid: No thyroid mass or thyromegaly.     Vascular: No carotid bruit.     Trachea: Trachea  and phonation normal.  Cardiovascular:     Rate and Rhythm: Normal rate and regular rhythm.     Pulses: Normal pulses.     Heart sounds: S1 normal and S2 normal. No murmur heard.    No gallop.  Pulmonary:     Breath sounds: Normal breath sounds. Decreased air movement present. No wheezing, rhonchi or rales.  Abdominal:     General: Bowel sounds are normal.     Palpations: Abdomen is soft.     Tenderness: There is no abdominal tenderness. There is no guarding or rebound.     Hernia: No hernia is present.  Musculoskeletal:     Cervical back: Normal range of motion and neck supple.     Thoracic back: Spasms and tenderness present. No bony tenderness. Decreased range of motion.     Lumbar back: No tenderness or bony tenderness. Normal range of motion. Negative right straight leg raise test and negative left straight leg raise test.       Back:  Lymphadenopathy:     Cervical: No cervical adenopathy.  Skin:    General: Skin is warm and dry.     Findings: No rash.  Neurological:     Mental Status: He is alert.     Cranial Nerves: No cranial nerve deficit.     Sensory: No sensory deficit.     Gait: Gait normal.     Deep Tendon Reflexes: Reflexes are normal and symmetric.  Psychiatric:        Speech: Speech normal.        Behavior: Behavior normal.        Judgment: Judgment normal.       Diabetic  foot exam: Normal inspection No skin breakdown No calluses  Normal DP pulses Normal sensation to light touch and monofilament Nails normal  Results for orders placed or performed in visit on 03/26/23  HM DIABETES FOOT EXAM  Result Value Ref Range   HM Diabetic Foot Exam done      COVID 19 screen:  No recent travel or known exposure to COVID19 The patient denies respiratory symptoms of COVID 19 at this time. The importance of social distancing was discussed today.   Assessment and Plan   The patient's preventative maintenance and recommended screening tests for an annual wellness exam were reviewed in full today. Brought up to date unless services declined.  Counselled on the importance of diet, exercise, and its role in overall health and mortality. The patient's FH and SH was reviewed, including their home life, tobacco status, and drug and alcohol status.   Vaccines:uptodate, Tdap due,  Discussed COVID19 vaccine side effects and benefits. Strongly encouraged the patient to get the vaccine. Questions answered. Prostate Cancer Screen:stable PSA  Lab Results  Component Value Date   PSA1 0.3 02/28/2023   PSA1 0.5 02/22/2022   PSA1 0.7 02/21/2021   PSA 0.49 07/14/2015   PSA 0.48 12/20/2013   PSA 0.34 11/24/2012  Colon Cancer Screen: 08/2015 colonoscopy.. repeat in 10 years      Smoking Status:nonsmoker.. dips ETOH/ drug QMV:HQIO  Hep C: neg  HIV screen:  neg   Problem List Items Addressed This Visit     Acute right-sided thoracic back pain    Heat , gentle stretching and massage on right thoracic spine.. follow up for formal appt for back pain if its not improving.  Use ibuprofen 600-800 mg every 8 hours for pain and inflammation.. use instead aspirin... temporarily not longe term.  Chronic bronchitis, unspecified chronic bronchitis type (HCC)     Followed by pulmonary.   He cannot get dulera or trelegy... needs med assistance.. referral to  [facility] pharmacist  placed      Relevant Orders   AMB Referral to [facility] Coordinaton ([facility] Patients)   Controlled diabetes mellitus with circulatory complication  ( HTN) (HCC)    Excellent control with diet.      History of substance abuse (HCC)    No current issues. Per pt  Contributed to current breathing issues.      Hyperlipidemia associated with type 2 diabetes mellitus (HCC)    Stable, chronic.  Continue current medication.  LDL at goal less than 100 on simvastatin 20 mg daily      Hypertension associated with diabetes (HCC)    Stable, chronic.  Continue current medication.   lisinopril 40 mg daily.  Amlodipine 2.5 mg daily       Low testosterone    Followed by Dr. Lonna Cobb      MDD (major depressive disorder), single episode, moderate (HCC)    Chronic, stable  Citalopram 40 mg p.o. daily      Other Visit Diagnoses     Medicare annual wellness visit, subsequent    -  Primary       Kerby Nora, MD

## 2023-03-26 NOTE — Assessment & Plan Note (Signed)
Heat , gentle stretching and massage on right thoracic spine.. follow up for formal appt for back pain if its not improving.  Use ibuprofen 600-800 mg every 8 hours for pain and inflammation.. use instead aspirin... temporarily not longe term.

## 2023-03-26 NOTE — Patient Instructions (Addendum)
Set up yearly eye exam for diabetes and have the opthalmologist send [place] a copy of the evaluation for the chart.  Heat , gentle stretching and massage on right thoracic spine.. follow up for formal appt for back pain if its not improving.  Use ibuprofen 600-800 mg every 8 hours for pain and inflammation.. use instead aspirin... temporarily not longe term.

## 2023-03-26 NOTE — Assessment & Plan Note (Signed)
Followed by Dr Stoioff.   

## 2023-03-26 NOTE — Assessment & Plan Note (Signed)
No current issues. Per pt  Contributed to current breathing issues. 

## 2023-03-26 NOTE — Assessment & Plan Note (Signed)
Excellent control with diet  

## 2023-03-26 NOTE — Assessment & Plan Note (Addendum)
Followed by pulmonary.   He cannot get dulera or trelegy... needs med assistance.. referral to  [facility] pharmacist placed

## 2023-03-27 ENCOUNTER — Other Ambulatory Visit: Payer: Self-pay

## 2023-03-27 ENCOUNTER — Other Ambulatory Visit: Payer: Self-pay | Admitting: Family Medicine

## 2023-04-09 ENCOUNTER — Telehealth: Payer: Self-pay

## 2023-04-09 NOTE — Progress Notes (Signed)
   Care Guide Note  04/09/2023 Name: AXTON DUTCH MRN: [id number] DOB: 06/13/1956  Referred by: Excell Seltzer, MD Reason for referral : Care Coordination (Outreach to schedule with Pharm d )   KENDRYK SACO is a 67 y.o. year old male who is a primary care patient of Excell Seltzer, MD. RIVAAN MCNELIS was referred to the pharmacist for assistance related to COPD.    Successful contact was made with the patient to discuss pharmacy services including being ready for the pharmacist to call at least 5 minutes before the scheduled appointment time, to have medication bottles and any blood sugar or blood pressure readings ready for review. The patient agreed to meet with the pharmacist via with the pharmacist via telephone visit on (date/time).  05/15/2023  Penne Lash, RMA Care Guide [facility]  [place], [place] [address] Direct Dial: [phone] [email or web address]

## 2023-05-15 ENCOUNTER — Other Ambulatory Visit: Payer: PPO | Admitting: Pharmacist

## 2023-05-15 NOTE — Patient Instructions (Signed)
Art,   It was great talking with you today! I'll work with Dr. Ermalene Searing to send in a 90 day supply of Trelegy.   Thanks!  Catie Eppie Gibson, PharmD, BCACP, CPP Clinical Pharmacist [facility] [phone]

## 2023-05-15 NOTE — Progress Notes (Signed)
05/15/2023 Name: Cody Day MRN: [id number] DOB: 1955/10/25  Chief Complaint  Patient presents with   Medication Management    COPD    Cody Day is a 67 y.o. year old male who presented for a telephone visit.   They were referred to the pharmacist by their PCP for assistance in managing medication access.   Subjective:  Care Team: Primary Care Provider: Excell Seltzer, MD ; Next Scheduled Visit: 05/15/23  Medication Access/Adherence  Current Pharmacy:  [facility] - [place], [place] - [address] [place] [address] Phone: [phone] Fax: [phone]  [facility] [address] [place], [place] - [address] [place] [address] Phone: [phone] Fax: [phone]   Patient reports affordability concerns with their medications: Yes  Patient reports access/transportation concerns to their pharmacy: No  Patient reports adherence concerns with their medications:  No     Hypertension:  Current medications: lisinopril 40 mg daily, amlodipine 2.5 mg daily  Hyperlipidemia/ASCVD Risk Reduction  Current lipid lowering medications: simvastatin 20 mg daily  COPD + asthma:  Current medications: albuterol HFA PRN Medications tried in the past: previously on Dulera, Trelegy. Notes Trelegy worked very well, but was told his insurance did not cover it   Reports 1 exacerbations in the past year   Objective:  Lab Results  Component Value Date   HGBA1C 6.6 (H) 03/19/2023    Lab Results  Component Value Date   CREATININE 0.78 03/19/2023   BUN 7 03/19/2023   NA 136 03/19/2023   K 4.2 03/19/2023   CL 98 03/19/2023   CO2 31 03/19/2023    Lab Results  Component Value Date   CHOL 128 03/19/2023   HDL 47.00 03/19/2023   LDLCALC 51 03/19/2023   LDLDIRECT 53.0 08/23/2016   TRIG 152.0 (H) 03/19/2023   CHOLHDL 3 03/19/2023    Medications Reviewed Today     Reviewed by Alden Hipp, RPH-CPP  (Pharmacist) on 05/15/23 at [phone]  Med List Status: <None>   Medication Order Taking? Sig Documenting Provider Last Dose Status Informant  albuterol (VENTOLIN HFA) 108 (90 Base) MCG/ACT inhaler [id number] Yes INHALE 2 PUFFS INTO THE LUNGS EVERY 6 HOURS AS NEEDED FOR WHEEZING OR SHORTNESS OF BREATH Bedsole, Amy E, MD Taking Active   allopurinol (ZYLOPRIM) 300 MG tablet [id number] Yes TAKE 1 TABLET BY MOUTH DAILY Bedsole, Amy E, MD Taking Active   amLODipine (NORVASC) 2.5 MG tablet [id number] Yes Take 1 tablet (2.5 mg total) by mouth daily. Iran Ouch, MD Taking Active   citalopram (CELEXA) 40 MG tablet [id number] Yes TAKE 1 TABLET BY MOUTH DAILY Bedsole, Amy E, MD Taking Active   lisinopril (ZESTRIL) 40 MG tablet [id number] Yes TAKE 1 TABLET BY MOUTH DAILY Bedsole, Amy E, MD Taking Active   montelukast (SINGULAIR) 10 MG tablet [id number] Yes TAKE 1 TABLET BY MOUTH DAILY Bedsole, Amy E, MD Taking Active   Multiple Vitamin (MULTIVITAMIN WITH MINERALS) TABS tablet [id number] Yes Take 1 tablet by mouth daily. Enid Baas, MD Taking Active   simvastatin (ZOCOR) 20 MG tablet [id number] Yes TAKE 1 TABLET BY MOUTH DAILY Bedsole, Amy E, MD Taking Active   testosterone cypionate (DEPOTESTOSTERONE CYPIONATE) 200 MG/ML injection [id number] Yes INJECT 0.3 ML INTO THE MUSCLE ONCE WEEKLY Stoioff, Verna Czech, MD Taking Active               Assessment/Plan:   Hypertension: - Currently controlled - Recommend to continue current  regimen at this time   Hyperlipidemia/ASCVD Risk Reduction: - Currently controlled.  - Recommend to continue current regimen at this time    COPD: - Currently uncontrolled due to lack of access. Due to COPD/asthma overlap, recommend triple therapy.  - Reviewed formulary, Trelegy is preferred on [employer], Tier 3. Patient notes he would be able to afford this copay. PA not required. Will notify PCP to send script for Trelegy  Follow Up Plan: pharmacy needs  met   Catie Eppie Gibson, PharmD, BCACP, CPP Clinical Pharmacist [facility] [phone]

## 2023-05-16 MED ORDER — TRELEGY ELLIPTA 200-62.5-25 MCG/ACT IN AEPB: 1.0000 | INHALATION_SPRAY | Freq: Every day | RESPIRATORY_TRACT | 1 refills | Status: DC

## 2023-06-24 ENCOUNTER — Other Ambulatory Visit: Payer: Self-pay | Admitting: Family Medicine

## 2023-09-04 ENCOUNTER — Other Ambulatory Visit: Payer: PPO

## 2023-09-04 ENCOUNTER — Telehealth: Payer: Self-pay | Admitting: Urology

## 2023-09-04 DIAGNOSIS — E291 Testicular hypofunction: Secondary | ICD-10-CM | POA: Diagnosis not present

## 2023-09-04 NOTE — Telephone Encounter (Signed)
Pt was here today for labs.  He wanted [place] to know he only had a couple of shots left.  I told him to contact pharmacy.

## 2023-09-05 ENCOUNTER — Other Ambulatory Visit: Payer: Self-pay | Admitting: Urology

## 2023-09-05 DIAGNOSIS — E291 Testicular hypofunction: Secondary | ICD-10-CM

## 2023-09-05 LAB — TESTOSTERONE: Testosterone: 362 ng/dL (ref 264–916)

## 2023-09-05 LAB — HEMOGLOBIN AND HEMATOCRIT, BLOOD
Hematocrit: 45.6 % (ref 37.5–51.0)
Hemoglobin: 14.3 g/dL (ref 13.0–17.7)

## 2023-09-10 LAB — HM DIABETES EYE EXAM

## 2023-09-30 NOTE — Telephone Encounter (Signed)
Notes received from eye exam

## 2023-12-16 ENCOUNTER — Other Ambulatory Visit: Payer: Self-pay | Admitting: Family Medicine

## 2023-12-16 ENCOUNTER — Other Ambulatory Visit: Payer: Self-pay | Admitting: Cardiovascular Disease

## 2023-12-16 NOTE — Telephone Encounter (Signed)
 Patient is past due for an appt.  Can you please call and schedule with Arida or APP.   Thanks!

## 2023-12-17 NOTE — Telephone Encounter (Signed)
 Patient is past due for an appt.  Can you please call and schedule with Arida or APP.   Thanks!

## 2023-12-18 NOTE — Telephone Encounter (Signed)
*  STAT* If patient is at the pharmacy, call can be transferred to refill team.   1. Which medications need to be refilled? (please list name of each medication and dose if known) Amlodipine   2. Would you like to learn more about the convenience, safety, & potential cost savings by using the [facility]?     3. Are you open to using the [facility] (Type [facility].    4. Which pharmacy/location (including street and city if local pharmacy) is medication to be sent to? [facility] [address] [place], [place]   5. Do they need a 30 day or 90 day supply? Need enough until his appointment on 12-23-23 with Cadence

## 2023-12-23 ENCOUNTER — Encounter: Payer: Self-pay | Admitting: Medical

## 2023-12-23 ENCOUNTER — Ambulatory Visit: Payer: PPO | Attending: Medical | Admitting: Medical

## 2023-12-23 VITALS — BP 130/74 | HR 98 | Ht 71.0 in | Wt 254.2 lb

## 2023-12-23 DIAGNOSIS — E782 Mixed hyperlipidemia: Secondary | ICD-10-CM | POA: Diagnosis not present

## 2023-12-23 DIAGNOSIS — R072 Precordial pain: Secondary | ICD-10-CM | POA: Diagnosis not present

## 2023-12-23 DIAGNOSIS — G4733 Obstructive sleep apnea (adult) (pediatric): Secondary | ICD-10-CM | POA: Diagnosis not present

## 2023-12-23 DIAGNOSIS — I2510 Atherosclerotic heart disease of native coronary artery without angina pectoris: Secondary | ICD-10-CM

## 2023-12-23 DIAGNOSIS — R0602 Shortness of breath: Secondary | ICD-10-CM | POA: Diagnosis not present

## 2023-12-23 DIAGNOSIS — I10 Essential (primary) hypertension: Secondary | ICD-10-CM | POA: Diagnosis not present

## 2023-12-23 NOTE — Patient Instructions (Signed)
 Medication Instructions:  Your physician recommends that you continue on your current medications as directed. Please refer to the Current Medication list given to you today.   *If you need a refill on your cardiac medications before your next appointment, please call your pharmacy*   Lab Work: No labs ordered today    Testing/Procedures: No test ordered today    Follow-Up: At [facility], you and your health needs are our priority.  As part of our continuing mission to provide you with exceptional heart care, we have created designated Provider Care Teams.  These Care Teams include your primary Cardiologist (physician) and Advanced Practice Providers (APPs -  Physician Assistants and Nurse Practitioners) who all work together to provide you with the care you need, when you need it.  We recommend signing up for the patient portal called "MyChart".  Sign up information is provided on this After Visit Summary.  MyChart is used to connect with patients for Virtual Visits (Telemedicine).  Patients are able to view lab/test results, encounter notes, upcoming appointments, etc.  Non-urgent messages can be sent to your provider as well.   To learn more about what you can do with MyChart, go to [email or web address].    Your next appointment:   1 year(s)  Provider:   You may see Lorine Bears, MD or one of the following Advanced Practice Providers on your designated Care Team:   Nicolasa Ducking, NP Eula Listen, PA-C Cadence Fransico Michael, PA-C Charlsie Quest, NP Carlos Levering, NP

## 2023-12-23 NOTE — Progress Notes (Signed)
 Cardiology Office Note:  .   Date:  12/23/2023  ID:  Cody Day, DOB [birth year], MRN [id number] PCP: Excell Seltzer, MD  [facility] Providers Cardiologist:  Lorine Bears, MD {  History of Present Illness: .   Cody Day is a 68 y.o. male with a h/o mild nonobstructive CAD, asthma/COPD, anxiety, OSA, obesity, HTN, HLD and previous tobacco use and drug use who presents for follow-up.   He reports inhaling different kind of drugs that might have affected his lungs. He had extensive drug use from the age [age] years old.   He was first seen in 2018 for exertional dyspnea. R/L heart cath showed mild nonobstructive CAD, normal LVSF and normal RHC. Echo 10/2022 showed normal LVSF, G1DD and no significant valvular abnormalities. Lexiscan myoview showed small inferior apical defect likely due to artifact but could not rule out a small scar. Overall low risk study. He was last seen 12/07/23 with exertional dyspnea, felt to be from underlying lung disease.   Today, the patient reports he has been forgetting things. He knows cognitive function has been declining. He doesn't do much at home and he doesn't drive. He gets winded easily. He lives with his wife. He has occasional chest tightness when he exerts himself. He takes ibuprofen for chronic pain from when he fell down the steps in December 2024. He stopped ASA since taking ibuprofen.   Studies Reviewed: Marland Kitchen   EKG Interpretation Date/Time:  Tuesday December 23 2023 11:22:18 EST Ventricular Rate:  98 PR Interval:  176 QRS Duration:  92 QT Interval:  404 QTC Calculation: 515 R Axis:   -25  Text Interpretation: Sinus rhythm with Premature supraventricular complexes Minimal voltage criteria for LVH, may be normal variant ( R in aVL ) When compared with ECG of 01-Feb-2021 18:13, Premature ventricular complexes are no longer Present Premature supraventricular complexes are now Present QRS axis Shifted left Confirmed by Fransico Michael, Diondre Pulis ([id number]) on  12/23/2023 11:25:15 AM    MPI 10/2022   Abnormal pharmacologic myocardial perfusion stress test.   There is a small in size, mild in severity, fixed defect involving the apical lateral and apical inferior segments most consistent with scar but cannot rule out an element of artifact.   There is no evidence of significant ischemia.   Left ventricular systolic function is normal by visual estimation and Siemens calculation (LVEF 55%).   Mild coronary artery calcification is noted on the attenuation correction CT.   This is a low risk study.  Echo 10/2022 1. Left ventricular ejection fraction, by estimation, is 55 to 60%. Left  ventricular ejection fraction by 2D MOD biplane is 61.3 %. The left  ventricle has normal function. The left ventricle has no regional wall  motion abnormalities. Left ventricular  diastolic parameters are consistent with Grade I diastolic dysfunction  (impaired relaxation).   2. Right ventricular systolic function is normal. The right ventricular  size is normal.   3. The mitral valve is grossly normal. No evidence of mitral valve  regurgitation.   4. The aortic valve is tricuspid. Aortic valve regurgitation is not  visualized. Aortic valve sclerosis is present, with no evidence of aortic  valve stenosis.   5. Aortic dilatation noted. There is mild dilatation of the ascending  aorta, measuring 41 mm.   6. The inferior vena cava is dilated in size with >50% respiratory  variability, suggesting right atrial pressure of 8 mmHg.       Physical Exam:  VS:  BP 130/74 (BP Location: Left Arm, Patient Position: Sitting)   Pulse 98   Ht 5\' 11"  (1.803 m)   Wt 254 lb 3.2 oz (115.3 kg)   SpO2 98%   BMI 35.45 kg/m    Wt Readings from Last 3 Encounters:  12/23/23 254 lb 3.2 oz (115.3 kg)  03/26/23 247 lb (112 kg)  03/03/23 246 lb (111.6 kg)    GEN: Well nourished, well developed in no acute distress NECK: No JVD; No carotid bruits CARDIAC: RRR, no murmurs, rubs,  gallops RESPIRATORY:  Clear to auscultation without rales, wheezing or rhonchi  ABDOMEN: Soft, non-tender, non-distended EXTREMITIES:  No edema; No deformity   ASSESSMENT AND PLAN: .    DOE and chest tightness The patient has a long history of DOE and chest tightness. Prior cardiac work-up (echo and MPI in 2024) were unremarkable. He is very sedentary at baseline and has trouble walking up a flight of stairs. He reports cognitive function is slowly declining and he doesn't drive anymore. I recommend he start activity/walking program to gain stamina. He also is non-compliance with CPAP. No further work-up at this time.  HTN BP today is reasonable, 130/74. Continue Amlodipine 2.5mg  daily and Lisinopril 40mg  daily.  HLD LDL 51. Continue Simvastatin 20mg  daily.   Mild nonobstructive CAD Prior MPI in 2024 was abnormal with small defect most consistent with scar, no ischemia, low risk study. CT attenuation correction showed mild coronary artery calcification. He stopped ASA since he is taking ibuprofen daily for MSK/Joint pain from a fall in December 2024. I recommended he see PCP regarding pain. Continue Simvastatin and Lisinopril.   OSA  He is non-complian with CPAP.   Dispo: Follow-up in 1 year  Signed, Ramyah Pankowski David Stall, PA-C

## 2023-12-24 ENCOUNTER — Ambulatory Visit: Payer: PPO | Admitting: Internal Medicine

## 2023-12-24 ENCOUNTER — Encounter: Payer: Self-pay | Admitting: Internal Medicine

## 2023-12-24 VITALS — BP 138/88 | HR 74 | Temp 97.7°F | Ht 71.0 in | Wt 253.2 lb

## 2023-12-24 DIAGNOSIS — J9611 Chronic respiratory failure with hypoxia: Secondary | ICD-10-CM | POA: Diagnosis not present

## 2023-12-24 DIAGNOSIS — G4733 Obstructive sleep apnea (adult) (pediatric): Secondary | ICD-10-CM | POA: Diagnosis not present

## 2023-12-24 DIAGNOSIS — J449 Chronic obstructive pulmonary disease, unspecified: Secondary | ICD-10-CM

## 2023-12-24 NOTE — Progress Notes (Unsigned)
 Pulmonary function testing shows moderate  Name: ISAC LINCKS MRN: [id number] DOB: 10/22/1955      STUDIES:     01/2021  CXR independently reviewed by Me today Low lung volumes No pneumonia   Synopsis: Nassir Neidert first saw the [facility] pulmonary clinic in the spring of 2015 for shortness of breath, cough, mucus production. He had lung function testing in 2014 which was completely normal. He had a past history significant for long-term inhaled illicit drug use with multiple substances including methamphetamine/crack cocaine. Patient has smoked multiple substances in the past including crack marijuana smoking cigarettes Patient also has a history of alcohol abuse  Later PFT findings have shown airflow obstruction.  FEV1 67% 2019  Evidence of air trapping and hyperinflation    CC Follow-up assessment for COPD Follow-up assessment for chronic hypoxic respiratory failure Patient with a diagnosis of severe OSA    HISTORY OF PRESENT ILLNESS: Regarding COPD Moderate obstructive pulmonary disease Last FEV1 was 67% predicted Chronic shortness of breath and dyspnea exertion No exacerbation at this time No evidence of heart failure at this time No evidence or signs of infection at this time No respiratory distress No fevers, chills, nausea, vomiting, diarrhea No evidence of lower extremity edema No evidence hemoptysis  Regarding OSA Previous sleep study showed AHI of 64 which is severe CPAP titration performed indicated a CPAP of 14 would be ideal Patient is a high risk for stroke and heart failure- Patient has been noncompliant for many years  Patient has chronic hypoxic respiratory failure Refuses to wear oxygen High risk cardiac arrest and death   PAST MEDICAL HISTORY :   has a past medical history of Achilles tendon rupture (10/11), Adenomatous polyp of colon (04/06/2010), Asthma, COPD (chronic obstructive pulmonary disease) (HCC), Gout, Hemorrhoids,  Hypercholesteremia, Hypertension, Sleep apnea, and Syncope (10/11).  has a past surgical history that includes treadmill stress test (2003); intestinal blockage; Abdominal hernia repair (7829,5621); Femoral hernia repair (2007); left heart cath (2008); KIDNEY STONE extraction; calcium kidney stones; Colonoscopy (2011); Hemorrhoid surgery; Hernia repair (09/10/2010); Hernia repair ( 02//28/2007); Hernia repair (11/01/1990  .); Hernia repair (01/17/1997); Hernia repair (01/07/1995 ); Cardiac catheterization; Colonoscopy with propofol (N/A, 08/30/2015); and RIGHT/LEFT HEART CATH AND CORONARY ANGIOGRAPHY (N/A, 01/13/2017). Prior to Admission medications   Medication Sig Start Date End Date Taking? Authorizing Provider  albuterol (PROVENTIL) (2.5 MG/3ML) 0.083% nebulizer solution Take 3 mLs (2.5 mg total) by nebulization every 4 (four) hours as needed for wheezing or shortness of breath. 04/17/18   Bedsole, Amy E, MD  allopurinol (ZYLOPRIM) 300 MG tablet Take 1 tablet (300 mg total) by mouth daily. 06/09/18   Bedsole, Amy E, MD  Cholecalciferol (VITAMIN D3) 50000 units CAPS Take 1 capsule by mouth once a week. 06/09/18   Bedsole, Amy E, MD  citalopram (CELEXA) 20 MG tablet Take 1 tablet (20 mg total) by mouth daily. 07/22/18 10/20/18  Enid Baas, MD  fluticasone (FLOVENT HFA) 220 MCG/ACT inhaler Inhale 2 puffs into the lungs 2 (two) times daily.    [provider]  lisinopril (PRINIVIL,ZESTRIL) 20 MG tablet TAKE ONE (1) TABLET EACH DAY 06/12/18   Bedsole, Amy E, MD  montelukast (SINGULAIR) 10 MG tablet Take 1 tablet (10 mg total) by mouth daily. 06/09/18 06/09/19  Excell Seltzer, MD  Multiple Vitamin (MULTIVITAMIN WITH MINERALS) TABS tablet Take 1 tablet by mouth daily. 07/22/18   Enid Baas, MD  PROAIR HFA 108 [phone] Base) MCG/ACT inhaler Use 2 inhalations every four to six  hours as needed 09/14/18   Bedsole, Amy E, MD  simvastatin (ZOCOR) 20 MG tablet TAKE ONE TABLET BY MOUTH EVERY DAY 09/23/18    Bedsole, Amy E, MD  STIOLTO RESPIMAT 2.5-2.5 MCG/ACT AERS Use two inhalations every day 09/14/18   Excell Seltzer, MD  testosterone cypionate (DEPOTESTOTERONE CYPIONATE) 100 MG/ML injection Inject 1 mL (100 mg total) into the muscle once a week. 09/23/18   Stoioff, Verna Czech, MD   Allergies  Allergen Reactions   Levaquin [Levofloxacin In D5w]     Severe headaches, skin tingling/redness   Penicillins Hives and Rash    Has patient had a PCN reaction causing immediate rash, facial/tongue/throat swelling, SOB or lightheadedness with hypotension: Yes Has patient had a PCN reaction causing severe rash involving mucus membranes or skin necrosis: Unknown Has patient had a PCN reaction that required hospitalization: No Has patient had a PCN reaction occurring within the last 10 years: No - Childhood reaction If all of the above answers are "NO", then may proceed with Cephalosporin use.     SOCIAL HISTORY:  reports that he quit smoking about 67 years ago. His smoking use included cigarettes. He started smoking about 63 years ago. He has a 8.8 pack-year smoking history. His smokeless tobacco use includes chew. He reports that he does not currently use alcohol. He reports that he does not use drugs.    BP 138/88 (BP Location: Left Arm, Patient Position: Sitting, Cuff Size: Normal)   Pulse 74   Temp 97.7 F (36.5 C) (Temporal)   Ht 5\' 11"  (1.803 m)   Wt 253 lb 3.2 oz (114.9 kg)   SpO2 98%   BMI 35.31 kg/m    Review of Systems: Gen:  Denies  fever, sweats, chills weight loss  HEENT: Denies blurred vision, double vision, ear pain, eye pain, hearing loss, nose bleeds, sore throat Cardiac:  No dizziness, chest pain or heaviness, chest tightness,edema, No JVD Resp:   No cough, -sputum production, -shortness of breath,-wheezing, -hemoptysis,  Other:  All other systems negative   Physical Examination:   General Appearance: No distress  EYES PERRLA, EOM intact.   NECK Supple, No JVD Pulmonary:  normal breath sounds, No wheezing.  CardiovascularNormal S1,S2.  No m/r/g.   Abdomen: Benign, Soft, non-tender. Neurology UE/LE 5/5 strength, no focal deficits Ext pulses intact, cap refill intact ALL OTHER ROS ARE NEGATIVE       ASSESSMENT / PLAN:  68 year old male seen today for follow-up assessment for persistent chronic shortness of breath with moderate COPD with a history of peripheral artery disease and vertigo with chronic hypoxic respiratory failure in the setting of severe sleep apnea with AHI of 64 with noncompliance   Assessment of COPD Moderate COPD and deconditioned state Continue to use Trelegy as prescribed Use albuterol as needed Avoid Allergens and Irritants Avoid secondhand smoke Avoid SICK contacts Recommend  Masking  when appropriate Recommend Keep up-to-date with vaccinations    Chronic Hypoxic resp failure due to COPD -recommend using oxygen as prescribed -patient needs this for survival-PATIENT IS AT HIGH RISK FOR CARDIOPULMONARY Complications  INCLUDING CARDIAC ARREST AND DEATH  Obesity -recommend significant weight loss -recommend changing diet  Deconditioned state -Recommend increased daily activity and exercise   PAD-follow up vascular surgery  Severe OSA-non-complaint with CPAP PATIENT IS AT HIGH RISK FOR CARDIOPULMONARY Complications INCLUDING CARDIAC ARREST AND DEATH    MEDICATION ADJUSTMENTS/LABS AND TESTS ORDERED: Continue albuterol as needed Continue Trelegy as prescribed Avoid Allergens and Irritants Avoid secondhand smoke  Avoid SICK contacts Recommend  Masking  when appropriate Recommend Keep up-to-date with vaccinations   CURRENT MEDICATIONS REVIEWED AT LENGTH WITH PATIENT TODAY   Patient  satisfied with Plan of action and management. All questions answered   I spent a total of 42  minutes reviewing chart data, face-to-face evaluation with the patient, counseling and coordination of care as detailed above.       Lucie Leather, M.D.  [facility]  Medical Director [facility] Medical Director [facility] Cardio-Pulmonary Department

## 2023-12-24 NOTE — Patient Instructions (Signed)
 Continue Trelegy inhaler as prescribed Recommend rinsing mouth after every use  Continue albuterol as needed  Avoid Allergens and Irritants Avoid secondhand smoke Avoid SICK contacts Recommend  Masking  when appropriate Recommend Keep up-to-date with vaccinations   Be aware of reduced alertness and do not drive or operate heavy machinery if experiencing this or drowsiness.  Exercise encouraged, as tolerated. Encouraged proper weight management.  Important to get eight or more hours of sleep  Limiting the use of the computer and television before bedtime.  Decrease naps during the day, so night time sleep will become enhanced.  Limit caffeine, and sleep deprivation.  HTN, stroke, uncontrolled diabetes and heart failure are potential risk factors.  Risk of untreated sleep apnea including cardiac arrhthymias, stroke, DM, pulm HTN, lung failure, death

## 2024-03-04 ENCOUNTER — Ambulatory Visit: Payer: Self-pay | Admitting: Urology

## 2024-03-19 ENCOUNTER — Other Ambulatory Visit: Payer: Self-pay | Admitting: Family Medicine

## 2024-03-19 MED ORDER — ALLOPURINOL 300 MG PO TABS: 300.0000 mg | ORAL_TABLET | Freq: Every day | ORAL | 0 refills | Status: DC

## 2024-03-19 MED ORDER — ALBUTEROL SULFATE HFA 108 (90 BASE) MCG/ACT IN AERS: 2.0000 | INHALATION_SPRAY | Freq: Four times a day (QID) | RESPIRATORY_TRACT | 0 refills | Status: DC | PRN

## 2024-03-19 MED ORDER — CITALOPRAM HYDROBROMIDE 40 MG PO TABS: 40.0000 mg | ORAL_TABLET | Freq: Every day | ORAL | 0 refills | Status: DC

## 2024-03-19 MED ORDER — LISINOPRIL 40 MG PO TABS: 40.0000 mg | ORAL_TABLET | Freq: Every day | ORAL | 0 refills | Status: DC

## 2024-03-19 MED ORDER — TRELEGY ELLIPTA 200-62.5-25 MCG/ACT IN AEPB: 1.0000 | INHALATION_SPRAY | Freq: Every day | RESPIRATORY_TRACT | 0 refills | Status: DC

## 2024-03-19 NOTE — Telephone Encounter (Signed)
 Copied from CRM [id number]. Topic: Clinical - Medication Refill >> Mar 19, 2024  1:29 PM Jenice Mitts wrote: Medication: lisinopril  (ZESTRIL ) 40 MG tablet Fluticasone -Umeclidin-Vilant (TRELEGY ELLIPTA ) 200-62.5-25 MCG/ACT AEPB albuterol  (VENTOLIN  HFA) 108 (90 Base) MCG/ACT inhaler citalopram  (CELEXA ) 40 MG tablet allopurinol  (ZYLOPRIM ) 300 MG tablet   Has the patient contacted their pharmacy? Yes (Agent: If no, request that the patient contact the pharmacy for the refill. If patient does not wish to contact the pharmacy document the reason why and proceed with request.) (Agent: If yes, when and what did the pharmacy advise?)  This is the patient's preferred pharmacy:   [facility] [address] [place], [place] [address] [phone]  Is this the correct pharmacy for this prescription? Yes If no, delete pharmacy and type the correct one.   Has the prescription been filled recently? No  Is the patient out of the medication? Yes  Has the patient been seen for an appointment in the last year OR does the patient have an upcoming appointment? Yes  Can we respond through MyChart? Yes  Agent: Please be advised that Rx refills may take up to 3 business days. We ask that you follow-up with your pharmacy.

## 2024-03-22 ENCOUNTER — Telehealth: Payer: Self-pay | Admitting: Family Medicine

## 2024-03-22 ENCOUNTER — Telehealth: Payer: Self-pay | Admitting: *Deleted

## 2024-03-22 DIAGNOSIS — E1159 Type 2 diabetes mellitus with other circulatory complications: Secondary | ICD-10-CM

## 2024-03-22 DIAGNOSIS — Z125 Encounter for screening for malignant neoplasm of prostate: Secondary | ICD-10-CM

## 2024-03-22 DIAGNOSIS — E1169 Type 2 diabetes mellitus with other specified complication: Secondary | ICD-10-CM

## 2024-03-22 DIAGNOSIS — E559 Vitamin D deficiency, unspecified: Secondary | ICD-10-CM

## 2024-03-22 DIAGNOSIS — E291 Testicular hypofunction: Secondary | ICD-10-CM

## 2024-03-22 DIAGNOSIS — R972 Elevated prostate specific antigen [PSA]: Secondary | ICD-10-CM

## 2024-03-22 NOTE — Telephone Encounter (Signed)
 error

## 2024-03-22 NOTE — Telephone Encounter (Signed)
 Copied from CRM [id number]. Topic: Clinical - Request for Lab/Test Order >> Mar 22, 2024 11:09 AM Alyse July wrote: Reason for CRM: Patient would like to have labs placed prior to Physical Appt scheduled on 05/25/24. [phone].

## 2024-03-22 NOTE — Telephone Encounter (Signed)
 Future lab orders placed in EPIC.  Lab appointment already scheduled.

## 2024-03-24 ENCOUNTER — Ambulatory Visit: Admitting: Urology

## 2024-04-01 ENCOUNTER — Ambulatory Visit: Payer: PPO

## 2024-04-01 ENCOUNTER — Ambulatory Visit: Payer: PPO | Admitting: Family Medicine

## 2024-04-20 ENCOUNTER — Telehealth: Payer: Self-pay | Admitting: *Deleted

## 2024-04-20 NOTE — Telephone Encounter (Signed)
 Future orders already in Epic

## 2024-04-20 NOTE — Telephone Encounter (Signed)
-----   Message from Harlene Du sent at 04/20/2024  4:13 PM EDT ----- Regarding: Lab Tues 05/18/24 Hello,  Patient is coming in for CPE labs on Tuesday 05/18/24. Can we get orders please.   Thanks

## 2024-05-14 ENCOUNTER — Ambulatory Visit

## 2024-05-14 VITALS — Ht 71.0 in | Wt 243.0 lb

## 2024-05-14 DIAGNOSIS — Z0000 Encounter for general adult medical examination without abnormal findings: Secondary | ICD-10-CM

## 2024-05-14 NOTE — Progress Notes (Signed)
 Please attest and cosign this visit due to patients primary care provider not being in the office at the time the visit was completed.    Subjective:   Cody Day is a 68 y.o. who presents for a Medicare Wellness preventive visit.  As a reminder, Annual Wellness Visits don't include a physical exam, and some assessments may be limited, especially if this visit is performed virtually. We may recommend an in-person follow-up visit with your provider if needed.  Visit Complete: Virtual I connected with  Cody Day on 05/14/24 by a audio enabled telemedicine application and verified that I am speaking with the correct person using two identifiers.  Patient Location: Home  Provider Location: Home Office  I discussed the limitations of evaluation and management by telemedicine. The patient expressed understanding and agreed to proceed.  Vital Signs: Because this visit was a virtual/telehealth visit, some criteria may be missing or patient reported. Any vitals not documented were not able to be obtained and vitals that have been documented are patient reported.  VideoDeclined- This patient declined Librarian, academic. Therefore the visit was completed with audio only.  Persons Participating in Visit: Patient.  AWV Questionnaire: No: Patient Medicare AWV questionnaire was not completed prior to this visit.  Cardiac Risk Factors include: advanced age (>2men, >2 women);diabetes mellitus;dyslipidemia;hypertension;male gender;obesity (BMI >30kg/m2)     Objective:    Today's Vitals   05/14/24 1308  Weight: 243 lb (110.2 kg)  Height: 5' 11 (1.803 m)   Body mass index is 33.89 kg/m.     05/14/2024    1:23 PM 02/01/2021    5:21 PM 02/03/2020    2:05 PM 10/04/2019   10:35 AM 10/01/2019    1:16 PM 07/20/2018    1:42 PM 12/12/2015   10:55 AM  Advanced Directives  Does Patient Have a Medical Advance Directive? No No No No No No  No   Would patient like  information on creating a medical advance directive?   No - Patient declined No - Patient declined No - Patient declined Yes (ED - Information included in AVS)       Data saved with a previous flowsheet row definition    Current Medications (verified) Outpatient Encounter Medications as of 05/14/2024  Medication Sig   albuterol  (VENTOLIN  HFA) 108 (90 Base) MCG/ACT inhaler Inhale 2 puffs into the lungs every 6 (six) hours as needed for wheezing or shortness of breath.   allopurinol  (ZYLOPRIM ) 300 MG tablet Take 1 tablet (300 mg total) by mouth daily.   amLODipine  (NORVASC ) 2.5 MG tablet TAKE 1 TABLET BY MOUTH DAILY   citalopram  (CELEXA ) 40 MG tablet Take 1 tablet (40 mg total) by mouth daily.   Fluticasone -Umeclidin-Vilant (TRELEGY ELLIPTA ) 200-62.5-25 MCG/ACT AEPB Inhale 1 puff into the lungs daily.   lisinopril  (ZESTRIL ) 40 MG tablet Take 1 tablet (40 mg total) by mouth daily.   montelukast  (SINGULAIR ) 10 MG tablet TAKE 1 TABLET BY MOUTH DAILY   Multiple Vitamin (MULTIVITAMIN WITH MINERALS) TABS tablet Take 1 tablet by mouth daily.   simvastatin  (ZOCOR ) 20 MG tablet TAKE 1 TABLET BY MOUTH DAILY   testosterone  cypionate (DEPOTESTOSTERONE CYPIONATE) 200 MG/ML injection INJECT 0.3 ML INTO THE MUSCLE ONCE WEEKLY   No facility-administered encounter medications on file as of 05/14/2024.    Allergies (verified) Levaquin [levofloxacin in d5w] and Penicillins   History: Past Medical History:  Diagnosis Date   Achilles tendon rupture 10/11   and repair   Adenomatous polyp  of colon 04/06/2010   Adenomatous polyps 3 in the descending colon, times one in the ascending colon [facility])   Asthma    COPD (chronic obstructive pulmonary disease) (HCC)    Gout    Hemorrhoids    Hypercholesteremia    Hypertension    Sleep apnea    doesn't wear CPAP   Syncope 10/11   in settin gof asthma exacerbation w coughing. Ech (10/11): EF > 55%, mild LVH, grade I diastolic dysfunction, nomral RV size  and systolic function,. normal valves. Carotid US  (10/11): minimal disease   Past Surgical History:  Procedure Laterality Date   ABDOMINAL HERNIA REPAIR  1998,2000   calcium kidney stones     CARDIAC CATHETERIZATION     [facility]    COLONOSCOPY  2011   [facility], [place], [place]; Dr. Arloa. Multiple adenomatous polyps (total 4).    COLONOSCOPY WITH PROPOFOL  N/A 08/30/2015   Procedure: COLONOSCOPY WITH PROPOFOL ;  Surgeon: Reyes LELON Cota, MD;  Location: [facility] ENDOSCOPY;  Service: Endoscopy;  Laterality: N/A;   FEMORAL HERNIA REPAIR  2007   HEMORRHOID SURGERY     HERNIA REPAIR  09/10/2010   Repair of recurrent ventral hernia, resection of previously placed mesh x2, implantation 7.5 x 10 cm Gore-Tex dual mesh in an underlay position, repair of epigastric hernia with a 4.2 cm Proceed ventral patch.   HERNIA REPAIR   02//28/2007   Laparoscopic right inguinal hernia repair with Surgipro mesh, Ventrilex patch at umbilicus   HERNIA REPAIR  11/01/1990  .   Small oval Kugel patch placed in preperitoneal space, Darina Laurence, MD   HERNIA REPAIR  01/17/1997   Recurrent ventral hernia 15 x 19 Gore-Tex dual mesh placed laparoscopically with multiple lefft--sided ports, Darina Laurence, MD   HERNIA REPAIR  01/07/1995    Primary repair of ventral hernia. Darina Laurence, MD   intestinal blockage     as a child   KIDNEY STONE extraction     unic acid stones   left heart cath  2008   minimal luminal irregularities EF 55%   RIGHT/LEFT HEART CATH AND CORONARY ANGIOGRAPHY N/A 01/13/2017   Procedure: Right/Left Heart Cath and Coronary Angiography;  Surgeon: Deatrice DELENA Cage, MD;  Location: [facility] INVASIVE CV LAB;  Service: Cardiovascular;  Laterality: N/A;   treadmill stress test  2003   Family History  Problem Relation Age of Onset   Hypertension Father    Cataracts Father    Diabetes Father    Heart attack Mother 20   Coronary artery disease Mother    Dementia Mother    Hypertension Sister     Depression Sister    Hypertension Brother    Colon cancer Brother    Diabetes Other        1st degree relative   Colon cancer Maternal Aunt    Social History   Socioeconomic History   Marital status: Married    Spouse name: Not on file   Number of children: Y   Years of education: Not on file   Highest education level: Not on file  Occupational History   Occupation: Oceanographer: [employer]  Tobacco Use   Smoking status: Former    Current packs/day: 0.00    Average packs/day: 0.3 packs/day for 35.0 years (8.8 ttl pk-yrs)    Types: Cigarettes    Start date: 10/21/1960    Quit date: 10/22/1995    Years since quitting: 28.5   Smokeless tobacco:  Current    Types: Chew   Tobacco comments:    rarely smoked cigarettes and cigars.  states he smoked drugs mostly  Vaping Use   Vaping status: Never Used  Substance and Sexual Activity   Alcohol use: Not Currently    Alcohol/week: 0.0 standard drinks of alcohol    Comment: used to use cocaine and methamphetamine   Drug use: No    Types: Marijuana, Methamphetamines    Comment: former smokerx 27 years   Sexual activity: Yes    Birth control/protection: None  Other Topics Concern   Not on file  Social History Narrative   Poor diet, lots of fats. Does not regularly exercise. Married. Originally from [place] .    Social Drivers of Corporate investment banker Strain: Low Risk  (05/14/2024)   Overall Financial Resource Strain (CARDIA)    Difficulty of Paying Living Expenses: Not hard at all  Food Insecurity: No Food Insecurity (05/14/2024)   Hunger Vital Sign    Worried About Running Out of Food in the Last Year: Never true    Ran Out of Food in the Last Year: Never true  Transportation Needs: No Transportation Needs (05/14/2024)   PRAPARE - Administrator, Civil Service (Medical): No    Lack of Transportation (Non-Medical): No  Physical Activity: Sufficiently Active (05/14/2024)    Exercise Vital Sign    Days of Exercise per Week: 3 days    Minutes of Exercise per Session: 60 min  Stress: No Stress Concern Present (05/14/2024)   [employer] of Occupational Health - Occupational Stress Questionnaire    Feeling of Stress: Not at all  Social Connections: Socially Integrated (05/14/2024)   Social Connection and Isolation Panel    Frequency of Communication with Friends and Family: Three times a week    Frequency of Social Gatherings with Friends and Family: Three times a week    Attends Religious Services: More than 4 times per year    Active Member of Clubs or Organizations: Yes    Attends Engineer, structural: More than 4 times per year    Marital Status: Married    Tobacco Counseling Ready to quit: Not Answered Counseling given: Not Answered Tobacco comments: rarely smoked cigarettes and cigars.  states he smoked drugs mostly    Clinical Intake:  Pre-visit preparation completed: Yes  Pain : No/denies pain     BMI - recorded: 33.89 Nutritional Status: BMI > 30  Obese Nutritional Risks: None Diabetes: Yes CBG done?: No Did pt. bring in CBG monitor from home?: No  Lab Results  Component Value Date   HGBA1C 6.6 (H) 03/19/2023   HGBA1C 6.7 (H) 09/24/2022   HGBA1C 6.1 (A) 12/20/2021     How often do you need to have someone help you when you read instructions, pamphlets, or other written materials from your doctor or pharmacy?: 1 - Never  Interpreter Needed?: No  Comments: lives with wife Information entered by :: B.Jassmin Kemmerer,LPN   Activities of Daily Living     05/14/2024    1:23 PM  In your present state of health, do you have any difficulty performing the following activities:  Hearing? 0  Vision? 0  Difficulty concentrating or making decisions? 0  Walking or climbing stairs? 0  Dressing or bathing? 0  Doing errands, shopping? 0  Preparing Food and eating ? N  Using the Toilet? N  In the past six months, have you  accidently leaked urine? N  Do you  have problems with loss of bowel control? N  Managing your Medications? N  Managing your Finances? N  Housekeeping or managing your Housekeeping? N    Patient Care Team: Avelina Greig BRAVO, MD as PCP - General Darron Deatrice LABOR, MD as PCP - Cardiology (Cardiology) Dessa Reyes ORN, MD (General Surgery) Estelle Fallow (Optometry)  I have updated your Care Teams any recent Medical Services you may have received from other providers in the past year.     Assessment:   This is a routine wellness examination for Cody Day.  Hearing/Vision screen Hearing Screening - Comments:: Pt says his hearing is good Vision Screening - Comments:: Pt says his vision /glasses (reading only) Appt w/Dr Estelle next month   Goals Addressed             This Visit's Progress    COMPLETED: Patient Stated       02/03/2020, I will continue cardio pulmonary rehab 3 days a week for 1 hour.     Patient Stated       I want to keep walking (5 miles/day)       Depression Screen     05/14/2024    1:18 PM 03/26/2023    9:36 AM 03/22/2022    9:50 AM 02/27/2021    2:06 PM 02/25/2020    9:42 AM 02/03/2020    2:06 PM 01/17/2020    8:42 AM  PHQ 2/9 Scores  PHQ - 2 Score 0 0 0 0 1 0 2  PHQ- 9 Score  0 9 4 12  0 11    Fall Risk     05/14/2024    1:13 PM 03/26/2023    9:36 AM 03/22/2022    9:45 AM 02/03/2020    2:06 PM 10/04/2019   10:34 AM  Fall Risk   Falls in the past year? 0 1 0 0  0   Number falls in past yr: 0 0  0 0   Injury with Fall? 0 0  0 0  Risk for fall due to : No Fall Risks No Fall Risks  Medication side effect   Follow up Education provided;Falls prevention discussed Falls evaluation completed  Falls evaluation completed;Falls prevention discussed  Falls evaluation completed;Education provided;Falls prevention discussed      Data saved with a previous flowsheet row definition    MEDICARE RISK AT HOME:  Medicare Risk at Home Any stairs in or around the  home?: Yes If so, are there any without handrails?: Yes Home free of loose throw rugs in walkways, pet beds, electrical cords, etc?: Yes Adequate lighting in your home to reduce risk of falls?: Yes Life alert?: No Use of a cane, walker or w/c?: No Grab bars in the bathroom?: Yes Shower chair or bench in shower?: Yes Elevated toilet seat or a handicapped toilet?: No  TIMED UP AND GO:  Was the test performed?  No  Cognitive Function: 6CIT completed    02/03/2020    2:15 PM  MMSE - Mini Mental State Exam  Orientation to time 5  Orientation to Place 5  Registration 3  Attention/ Calculation 5  Recall 1  Language- repeat 1      09/26/2016    8:00 AM 07/28/2014    1:48 PM  Montreal Cognitive Assessment   Visuospatial/ Executive (0/5) 4 5  Naming (0/3) 3 3  Attention: Read list of digits (0/2) 2 2  Attention: Read list of letters (0/1) 1 1  Attention: Serial 7 subtraction starting at 100 (  0/3) 3 3  Language: Repeat phrase (0/2) 2 2  Language : Fluency (0/1) 1 0  Abstraction (0/2) 2 2  Delayed Recall (0/5) 3 5  Orientation (0/6) 6 6  Total 27 29  Adjusted Score (based on education)  29      05/14/2024    1:28 PM  6CIT Screen  What Year? 0 points  What month? 0 points  What time? 0 points  Count back from 20 0 points  Months in reverse 0 points  Repeat phrase 0 points  Total Score 0 points    Immunizations Immunization History  Administered Date(s) Administered   Influenza Whole 11/21/2012   Influenza,inj,Quad PF,6+ Mos 09/06/2014, 07/18/2015, 07/29/2016, 09/19/2017, 07/21/2018, 07/29/2019, 08/29/2020   PFIZER(Purple Top)SARS-COV-2 Vaccination 04/01/2020, 04/24/2020   PNEUMOCOCCAL CONJUGATE-20 03/22/2022   Pneumococcal Polysaccharide-23 10/23/2012   Td 10/21/2009   Td (Adult),5 Lf Tetanus Toxid, Preservative Free 10/21/2009    Screening Tests Health Maintenance  Topic Date Due   Diabetic kidney evaluation - Urine ACR  Never done   Zoster Vaccines- Shingrix  (1 of 2) Never done   DTaP/Tdap/Td (3 - Tdap) 10/22/2019   HEMOGLOBIN A1C  09/19/2023   Diabetic kidney evaluation - eGFR measurement  03/18/2024   FOOT EXAM  03/25/2024   INFLUENZA VACCINE  05/21/2024   OPHTHALMOLOGY EXAM  09/09/2024   Medicare Annual Wellness (AWV)  05/14/2025   Colonoscopy  08/29/2025   Pneumococcal Vaccine: 50+ Years  Completed   Hepatitis C Screening  Completed   Hepatitis B Vaccines  Aged Out   HPV VACCINES  Aged Out   Meningococcal B Vaccine  Aged Out   COVID-19 Vaccine  Discontinued    Health Maintenance  Health Maintenance Due  Topic Date Due   Diabetic kidney evaluation - Urine ACR  Never done   Zoster Vaccines- Shingrix (1 of 2) Never done   DTaP/Tdap/Td (3 - Tdap) 10/22/2019   HEMOGLOBIN A1C  09/19/2023   Diabetic kidney evaluation - eGFR measurement  03/18/2024   FOOT EXAM  03/25/2024   Health Maintenance Items Addressed:   Additional Screening:  Vision Screening: Recommended annual ophthalmology exams for early detection of glaucoma and other disorders of the eye. Would you like a referral to an eye doctor? No    Dental Screening: Recommended annual dental exams for proper oral hygiene  Community Resource Referral / Chronic Care Management: CRR required this visit?  No   CCM required this visit?  No   Plan:    I have personally reviewed and noted the following in the patient's chart:   Medical and social history Use of alcohol, tobacco or illicit drugs  Current medications and supplements including opioid prescriptions. Patient is not currently taking opioid prescriptions. Functional ability and status Nutritional status Physical activity Advanced directives List of other physicians Hospitalizations, surgeries, and ER visits in previous 12 months Vitals Screenings to include cognitive, depression, and falls Referrals and appointments  In addition, I have reviewed and discussed with patient certain preventive protocols,  quality metrics, and best practice recommendations. A written personalized care plan for preventive services as well as general preventive health recommendations were provided to patient.   Cody LITTIE Saris, LPN   [id number]   After Visit Summary: (MyChart) Due to this being a telephonic visit, the after visit summary with patients personalized plan was offered to patient via MyChart   Notes: Nothing significant to report at this time.

## 2024-05-14 NOTE — Patient Instructions (Signed)
 [name] , Thank you for taking time out of your busy schedule to complete your Annual Wellness Visit with me. I enjoyed our conversation and look forward to speaking with you again next year. I, as well as your care team,  appreciate your ongoing commitment to your health goals. Please review the following plan we discussed and let me know if I can assist you in the future. Your Game plan/ To Do List     Follow up Visits: Next Medicare AWV with our clinical staff: 05/17/25 @ 1pm televisit   Have you seen your provider in the last 6 months (3 months if uncontrolled diabetes)? No Next Office Visit with your provider: 05/18/24 physical  Clinician Recommendations:  Aim for 30 minutes of exercise or brisk walking, 6-8 glasses of water, and 5 servings of fruits and vegetables each day.       This is a list of the screening recommended for you and due dates:  Health Maintenance  Topic Date Due   Yearly kidney health urinalysis for diabetes  Never done   Zoster (Shingles) Vaccine (1 of 2) Never done   DTaP/Tdap/Td vaccine (3 - Tdap) 10/22/2019   Hemoglobin A1C  09/19/2023   Yearly kidney function blood test for diabetes  03/18/2024   Complete foot exam   03/25/2024   Flu Shot  05/21/2024   Eye exam for diabetics  09/09/2024   Medicare Annual Wellness Visit  05/14/2025   Colon Cancer Screening  08/29/2025   Pneumococcal Vaccine for age over 68  Completed   Hepatitis C Screening  Completed   Hepatitis B Vaccine  Aged Out   HPV Vaccine  Aged Out   Meningitis B Vaccine  Aged Out   COVID-19 Vaccine  Discontinued    Advanced directives: (Declined) Advance directive discussed with you today. Even though you declined this today, please call our office should you change your mind, and we can give you the proper paperwork for you to fill out. Advance Care Planning is important because it:  [x]  Makes sure you receive the medical care that is consistent with your values, goals, and preferences  [x]   It provides guidance to your family and loved ones and reduces their decisional burden about whether or not they are making the right decisions based on your wishes.  Follow the link provided in your after visit summary or read over the paperwork we have mailed to you to help you started getting your Advance Directives in place. If you need assistance in completing these, please reach out to us  so that we can help you!

## 2024-05-18 ENCOUNTER — Ambulatory Visit: Payer: Self-pay | Admitting: Family Medicine

## 2024-05-18 ENCOUNTER — Other Ambulatory Visit (INDEPENDENT_AMBULATORY_CARE_PROVIDER_SITE_OTHER)

## 2024-05-18 DIAGNOSIS — E785 Hyperlipidemia, unspecified: Secondary | ICD-10-CM | POA: Diagnosis not present

## 2024-05-18 DIAGNOSIS — E1159 Type 2 diabetes mellitus with other circulatory complications: Secondary | ICD-10-CM | POA: Diagnosis not present

## 2024-05-18 DIAGNOSIS — Z125 Encounter for screening for malignant neoplasm of prostate: Secondary | ICD-10-CM

## 2024-05-18 DIAGNOSIS — E1169 Type 2 diabetes mellitus with other specified complication: Secondary | ICD-10-CM | POA: Diagnosis not present

## 2024-05-18 DIAGNOSIS — E559 Vitamin D deficiency, unspecified: Secondary | ICD-10-CM

## 2024-05-18 LAB — LIPID PANEL
Cholesterol: 146 mg/dL (ref 0–200)
HDL: 64.7 mg/dL (ref >39.00)
LDL Cholesterol: 53 mg/dL (ref 0–99)
NonHDL: 81.32
Total CHOL/HDL Ratio: 2
Triglycerides: 141 mg/dL (ref 0.0–149.0)
VLDL: 28.2 mg/dL (ref 0.0–40.0)

## 2024-05-18 LAB — COMPREHENSIVE METABOLIC PANEL WITH GFR
ALT: 12 U/L (ref 0–53)
AST: 15 U/L (ref 0–37)
Albumin: 4.6 g/dL (ref 3.5–5.2)
Alkaline Phosphatase: 79 U/L (ref 39–117)
BUN: 8 mg/dL (ref 6–23)
CO2: 28 meq/L (ref 19–32)
Calcium: 9.4 mg/dL (ref 8.4–10.5)
Chloride: 101 meq/L (ref 96–112)
Creatinine, Ser: 0.83 mg/dL (ref 0.40–1.50)
GFR: 90.57 mL/min (ref >60.00)
Glucose, Bld: 89 mg/dL (ref 70–99)
Potassium: 4.2 meq/L (ref 3.5–5.1)
Sodium: 139 meq/L (ref 135–145)
Total Bilirubin: 0.9 mg/dL (ref 0.2–1.2)
Total Protein: 6.8 g/dL (ref 6.0–8.3)

## 2024-05-18 LAB — HEMOGLOBIN A1C: Hgb A1c MFr Bld: 6.5 % (ref 4.6–6.5)

## 2024-05-18 LAB — VITAMIN D 25 HYDROXY (VIT D DEFICIENCY, FRACTURES): VITD: 35.55 ng/mL (ref 30.00–100.00)

## 2024-05-18 LAB — PSA, MEDICARE: PSA: 0.46 ng/mL (ref 0.10–4.00)

## 2024-05-18 NOTE — Progress Notes (Signed)
 No critical labs need to be addressed urgently. We will discuss labs in detail at upcoming office visit.

## 2024-05-18 NOTE — Progress Notes (Signed)
 This encounter was created in error - please disregard.

## 2024-05-18 NOTE — Addendum Note (Signed)
 Addended by: ISADORA RAISIN on: 05/18/2024 08:37 AM   Modules accepted: Orders

## 2024-05-19 ENCOUNTER — Other Ambulatory Visit: Payer: Self-pay

## 2024-05-19 DIAGNOSIS — E291 Testicular hypofunction: Secondary | ICD-10-CM

## 2024-05-20 ENCOUNTER — Other Ambulatory Visit

## 2024-05-20 DIAGNOSIS — E291 Testicular hypofunction: Secondary | ICD-10-CM | POA: Diagnosis not present

## 2024-05-21 LAB — PSA: Prostate Specific Ag, Serum: 0.4 ng/mL (ref 0.0–4.0)

## 2024-05-21 LAB — TESTOSTERONE: Testosterone: 168 ng/dL — ABNORMAL LOW (ref 264–916)

## 2024-05-21 LAB — HEMATOCRIT: Hematocrit: 50.7 % (ref 37.5–51.0)

## 2024-05-24 ENCOUNTER — Encounter: Payer: Self-pay | Admitting: Urology

## 2024-05-24 ENCOUNTER — Ambulatory Visit: Admitting: Urology

## 2024-05-24 VITALS — BP 154/85 | HR 67 | Ht 71.0 in | Wt 237.0 lb

## 2024-05-24 DIAGNOSIS — E291 Testicular hypofunction: Secondary | ICD-10-CM | POA: Diagnosis not present

## 2024-05-24 MED ORDER — TESTOSTERONE CYPIONATE 200 MG/ML IM SOLN: INTRAMUSCULAR | 0 refills | Status: AC

## 2024-05-24 NOTE — Progress Notes (Signed)
 05/24/2024 10:48 AM   Cody Day 1955-12-26 [id number]  Referring provider: Avelina Greig BRAVO, MD [address] [place],  [place] [address]  Chief Complaint  Patient presents with   Follow-up    Urologic history: 1.  Hypogonadism TRT with testosterone  cypionate  2.  History abnormal PSA MR fusion biopsy 07/2019 for abnormal PSA velocity (PI-RADS 3 lesion), benign pathology  HPI: Cody Day is a 68 y.o. male here for annual follow up of hypogonadism.  Currently injecting 0.6 cc every 2 weeks Labs 05/20/2024: Testosterone  168 ng/dL, PSA 0.4, hematocrit 49.2 Last injection was late He has no complaints. No bothersome LUTS  PSA trend  Prostate Specific Ag, Serum  Latest Ref Rng 0.0 - 4.0 ng/mL  11/04/2018 0.4   03/16/2019 1.5   04/29/2019 1.8   02/18/2020 0.3   02/21/2021 0.7   02/22/2022 0.5   02/28/2023 0.3   05/20/2024 0.4    PMH: Past Medical History:  Diagnosis Date   Achilles tendon rupture 10/11   and repair   Adenomatous polyp of colon 04/06/2010   Adenomatous polyps 3 in the descending colon, times one in the ascending colon [facility])   Asthma    COPD (chronic obstructive pulmonary disease) (HCC)    Gout    Hemorrhoids    Hypercholesteremia    Hypertension    Sleep apnea    doesn't wear CPAP   Syncope 10/11   in settin gof asthma exacerbation w coughing. Ech (10/11): EF > 55%, mild LVH, grade I diastolic dysfunction, nomral RV size and systolic function,. normal valves. Carotid US  (10/11): minimal disease    Surgical History: Past Surgical History:  Procedure Laterality Date   ABDOMINAL HERNIA REPAIR  1998,2000   calcium kidney stones     CARDIAC CATHETERIZATION     [facility]    COLONOSCOPY  2011   [facility], [place], [place]; Dr. Arloa. Multiple adenomatous polyps (total 4).    COLONOSCOPY WITH PROPOFOL  N/A 08/30/2015   Procedure: COLONOSCOPY WITH PROPOFOL ;  Surgeon: Reyes LELON Cota, MD;  Location: [facility] ENDOSCOPY;   Service: Endoscopy;  Laterality: N/A;   FEMORAL HERNIA REPAIR  2007   HEMORRHOID SURGERY     HERNIA REPAIR  09/10/2010   Repair of recurrent ventral hernia, resection of previously placed mesh x2, implantation 7.5 x 10 cm Gore-Tex dual mesh in an underlay position, repair of epigastric hernia with a 4.2 cm Proceed ventral patch.   HERNIA REPAIR   02//28/2007   Laparoscopic right inguinal hernia repair with Surgipro mesh, Ventrilex patch at umbilicus   HERNIA REPAIR  11/01/1990  .   Small oval Kugel patch placed in preperitoneal space, Cody Laurence, MD   HERNIA REPAIR  01/17/1997   Recurrent ventral hernia 15 x 19 Gore-Tex dual mesh placed laparoscopically with multiple lefft--sided ports, Cody Laurence, MD   HERNIA REPAIR  01/07/1995    Primary repair of ventral hernia. Cody Laurence, MD   intestinal blockage     as a child   KIDNEY STONE extraction     unic acid stones   left heart cath  2008   minimal luminal irregularities EF 55%   RIGHT/LEFT HEART CATH AND CORONARY ANGIOGRAPHY N/A 01/13/2017   Procedure: Right/Left Heart Cath and Coronary Angiography;  Surgeon: Deatrice DELENA Cage, MD;  Location: [facility] INVASIVE CV LAB;  Service: Cardiovascular;  Laterality: N/A;   treadmill stress test  2003    Home Medications:  Allergies as of 05/24/2024  Reactions   Levaquin [levofloxacin In D5w]    Severe headaches, skin tingling/redness   Penicillins Hives, Rash   Has patient had a PCN reaction causing immediate rash, facial/tongue/throat swelling, SOB or lightheadedness with hypotension: Yes Has patient had a PCN reaction causing severe rash involving mucus membranes or skin necrosis: Unknown Has patient had a PCN reaction that required hospitalization: No Has patient had a PCN reaction occurring within the last 10 years: No - Childhood reaction If all of the above answers are NO, then may proceed with Cephalosporin use.        Medication List        Accurate as of May 24, 2024 10:48 AM.  If you have any questions, ask your nurse or doctor.          albuterol  108 (90 Base) MCG/ACT inhaler Commonly known as: VENTOLIN  HFA Inhale 2 puffs into the lungs every 6 (six) hours as needed for wheezing or shortness of breath.   allopurinol  300 MG tablet Commonly known as: ZYLOPRIM  Take 1 tablet (300 mg total) by mouth daily.   amLODipine  2.5 MG tablet Commonly known as: NORVASC  TAKE 1 TABLET BY MOUTH DAILY   citalopram  40 MG tablet Commonly known as: CELEXA  Take 1 tablet (40 mg total) by mouth daily.   lisinopril  40 MG tablet Commonly known as: ZESTRIL  Take 1 tablet (40 mg total) by mouth daily.   montelukast  10 MG tablet Commonly known as: SINGULAIR  TAKE 1 TABLET BY MOUTH DAILY   multivitamin with minerals Tabs tablet Take 1 tablet by mouth daily.   simvastatin  20 MG tablet Commonly known as: ZOCOR  TAKE 1 TABLET BY MOUTH DAILY   testosterone  cypionate 200 MG/ML injection Commonly known as: DEPOTESTOSTERONE CYPIONATE INJECT 0.3 ML INTO THE MUSCLE ONCE WEEKLY   Trelegy Ellipta  200-62.5-25 MCG/ACT Aepb Generic drug: Fluticasone -Umeclidin-Vilant Inhale 1 puff into the lungs daily.        Allergies:  Allergies  Allergen Reactions   Levaquin [Levofloxacin In D5w]     Severe headaches, skin tingling/redness   Penicillins Hives and Rash    Has patient had a PCN reaction causing immediate rash, facial/tongue/throat swelling, SOB or lightheadedness with hypotension: Yes Has patient had a PCN reaction causing severe rash involving mucus membranes or skin necrosis: Unknown Has patient had a PCN reaction that required hospitalization: No Has patient had a PCN reaction occurring within the last 10 years: No - Childhood reaction If all of the above answers are NO, then may proceed with Cephalosporin use.     Family History: Family History  Problem Relation Age of Onset   Hypertension Father    Cataracts Father    Diabetes Father    Heart attack Mother 13    Coronary artery disease Mother    Dementia Mother    Hypertension Sister    Depression Sister    Hypertension Brother    Colon cancer Brother    Diabetes Other        1st degree relative   Colon cancer Maternal Aunt     Social History:  reports that he quit smoking about 28 years ago. His smoking use included cigarettes. He started smoking about 63 years ago. He has a 8.8 pack-year smoking history. His smokeless tobacco use includes chew. He reports that he does not currently use alcohol. He reports that he does not use drugs.   Physical Exam: BP (!) 154/85   Pulse 67   Ht 5' 11 (1.803 m)   Wt 237  lb (107.5 kg)   BMI 33.05 kg/m   Constitutional:  Alert and oriented, No acute distress. HEENT: [place] AT Respiratory: Normal respiratory effort, no increased work of breathing.SABRA Psychiatric: Normal mood and affect.   Assessment & Plan:    1. Hypogonadism Stable Testosterone  refilled Lab visit 6 months testosterone , H/H Office visit 1 year with testosterone , H/H, PSA   Glendia JAYSON Barba, MD  [facility] [address] [place], [place] [address] [phone]

## 2024-05-25 ENCOUNTER — Ambulatory Visit: Admitting: Family Medicine

## 2024-05-25 ENCOUNTER — Ambulatory Visit: Payer: Self-pay | Admitting: Family Medicine

## 2024-05-25 ENCOUNTER — Encounter: Payer: Self-pay | Admitting: Family Medicine

## 2024-05-25 VITALS — BP 158/98 | HR 69 | Temp 98.1°F | Ht 70.5 in | Wt 244.2 lb

## 2024-05-25 DIAGNOSIS — F321 Major depressive disorder, single episode, moderate: Secondary | ICD-10-CM | POA: Diagnosis not present

## 2024-05-25 DIAGNOSIS — E785 Hyperlipidemia, unspecified: Secondary | ICD-10-CM | POA: Diagnosis not present

## 2024-05-25 DIAGNOSIS — Z0000 Encounter for general adult medical examination without abnormal findings: Secondary | ICD-10-CM

## 2024-05-25 DIAGNOSIS — F1911 Other psychoactive substance abuse, in remission: Secondary | ICD-10-CM

## 2024-05-25 DIAGNOSIS — J4550 Severe persistent asthma, uncomplicated: Secondary | ICD-10-CM

## 2024-05-25 DIAGNOSIS — E1159 Type 2 diabetes mellitus with other circulatory complications: Secondary | ICD-10-CM | POA: Diagnosis not present

## 2024-05-25 DIAGNOSIS — E1169 Type 2 diabetes mellitus with other specified complication: Secondary | ICD-10-CM

## 2024-05-25 DIAGNOSIS — M1A9XX0 Chronic gout, unspecified, without tophus (tophi): Secondary | ICD-10-CM | POA: Diagnosis not present

## 2024-05-25 DIAGNOSIS — J42 Unspecified chronic bronchitis: Secondary | ICD-10-CM

## 2024-05-25 DIAGNOSIS — I152 Hypertension secondary to endocrine disorders: Secondary | ICD-10-CM

## 2024-05-25 LAB — HM DIABETES FOOT EXAM

## 2024-05-25 LAB — MICROALBUMIN / CREATININE URINE RATIO
Creatinine,U: 28.9 mg/dL
Microalb Creat Ratio: UNDETERMINED mg/g (ref 0.0–30.0)
Microalb, Ur: <0.7 mg/dL

## 2024-05-25 NOTE — Patient Instructions (Addendum)
 Keep up great work on lifestyle changes.  Follow BP at home.. call if  persistently > 140/90 for med adjustment.

## 2024-05-25 NOTE — Assessment & Plan Note (Addendum)
 Chronic, elevated in office... check at home... may need to increase amlodipine  if persistently high.   lisinopril  40 mg daily.  Amlodipine  2.5 mg daily

## 2024-05-25 NOTE — Assessment & Plan Note (Signed)
Chronic, stable  Citalopram 40 mg p.o. daily

## 2024-05-25 NOTE — Assessment & Plan Note (Signed)
Stable, chronic.  Continue current medication.  LDL at goal less than 100 on simvastatin 20 mg daily 

## 2024-05-25 NOTE — Assessment & Plan Note (Addendum)
 No current issues

## 2024-05-25 NOTE — Assessment & Plan Note (Signed)
 Chronic, poor overall controlled despite maximal treatment. On Trelegy.  followed by pulmonary.

## 2024-05-25 NOTE — Progress Notes (Signed)
 Patient ID: Cody Day, male    DOB: 02-15-1956, 68 y.o.   MRN: [id number]  This visit was conducted in person.  BP (!) 148/90   Pulse 69   Temp 98.1 F (36.7 C) (Temporal)   Ht 5' 10.5 (1.791 m)   Wt 244 lb 4 oz (110.8 kg)   SpO2 98%   BMI 34.55 kg/m    CC:  Chief Complaint  Patient presents with   Annual Exam    Part 2 ([facility] 05/14/24)    Subjective:   HPI: Cody Day is a 68 y.o. male presenting on 05/25/2024 for Annual Exam (Part 2 (M[facility] 05/14/24))  The patient presents for  complete physical and review of chronic health problems. He/She also has the following acute concerns today: none  The patient saw a LPN or RN for medicare wellness visit. 05/14/2024  Prevention and wellness was reviewed in detail. Note reviewed and important notes copied below if needed.   MDD, well-controlled on citalopram  40 mg p.o. daily Flowsheet Row Office Visit from 05/25/2024 in [facility]  PHQ-2 Total Score 0   Diabetes:   Diet controlled. Lab Results  Component Value Date   HGBA1C 6.5 05/18/2024  Using medications without difficulties: Hypoglycemic episodes: Hyperglycemic episodes: Feet problems: foot exam due.. no issues Blood Sugars averaging: not checking. eye exam within last year: done Microalbumin due but unable to collect today.  Lost weight about 15 lbs. Wt Readings from Last 3 Encounters:  05/25/24 244 lb 4 oz (110.8 kg)  05/24/24 237 lb (107.5 kg)  05/14/24 243 lb (110.2 kg)     ECHO 2021 EF 50-55%, diastolic dysfunction   Chronic severe asthma,COPD followed by pulmonary..  On Trelegy, has albuterol  to use prn.  Hypertension:    BP has been elevated Lisinopril  40 mg daily, amlodipine  32.5 mg daily. BP Readings from Last 3 Encounters:  05/25/24 (!) 148/90  05/24/24 (!) 154/85  12/24/23 138/88  Using medication without problems or lightheadedness:  none Chest pain with exertion: none Edema:none Short of breath:  none Average home BPs: Other issues:  Walking daily 2 miles    High cholesterol.. LDL at goal < 70 on   simvastatin  20 mg daily Lab Results  Component Value Date   CHOL 146 05/18/2024   HDL 64.70 05/18/2024   LDLCALC 53 05/18/2024   LDLDIRECT 53.0 08/23/2016   TRIG 141.0 05/18/2024   CHOLHDL 2 05/18/2024   Gout, no flares in the last year. On allopurinol  300 mg daily Lab Results  Component Value Date   LABURIC 5.7 09/24/2022     Relevant past medical, surgical, family and social history reviewed and updated as indicated. Interim medical history since our last visit reviewed. Allergies and medications reviewed and updated. Outpatient Medications Prior to Visit  Medication Sig Dispense Refill   albuterol  (VENTOLIN  HFA) 108 (90 Base) MCG/ACT inhaler Inhale 2 puffs into the lungs every 6 (six) hours as needed for wheezing or shortness of breath. 18 g 0   allopurinol  (ZYLOPRIM ) 300 MG tablet Take 1 tablet (300 mg total) by mouth daily. 90 tablet 0   amLODipine  (NORVASC ) 2.5 MG tablet TAKE 1 TABLET BY MOUTH DAILY 90 tablet 0   citalopram  (CELEXA ) 40 MG tablet Take 1 tablet (40 mg total) by mouth daily. 90 tablet 0   Fluticasone -Umeclidin-Vilant (TRELEGY ELLIPTA ) 200-62.5-25 MCG/ACT AEPB Inhale 1 puff into the lungs daily. 180 each 0   lisinopril  (ZESTRIL ) 40 MG tablet Take  1 tablet (40 mg total) by mouth daily. 90 tablet 0   montelukast  (SINGULAIR ) 10 MG tablet TAKE 1 TABLET BY MOUTH DAILY 90 tablet 3   Multiple Vitamin (MULTIVITAMIN WITH MINERALS) TABS tablet Take 1 tablet by mouth daily. 30 tablet 2   simvastatin  (ZOCOR ) 20 MG tablet TAKE 1 TABLET BY MOUTH DAILY 90 tablet 3   testosterone  cypionate (DEPOTESTOSTERONE CYPIONATE) 200 MG/ML injection INJECT 0.6 ML INTO THE MUSCLE EVERY 2 WEEKS 10 mL 0   No facility-administered medications prior to visit.     Per HPI unless specifically indicated in ROS section below Review of Systems  Constitutional:  Negative for fatigue and  fever.  HENT:  Negative for ear pain.   Eyes:  Negative for pain.  Respiratory:  Positive for cough, shortness of breath and wheezing.   Cardiovascular:  Negative for chest pain, palpitations and leg swelling.  Gastrointestinal:  Negative for abdominal pain.  Genitourinary:  Negative for dysuria.  Musculoskeletal:  Negative for arthralgias.  Neurological:  Negative for syncope, light-headedness and headaches.  Psychiatric/Behavioral:  Negative for dysphoric mood.    Objective:  BP (!) 148/90   Pulse 69   Temp 98.1 F (36.7 C) (Temporal)   Ht 5' 10.5 (1.791 m)   Wt 244 lb 4 oz (110.8 kg)   SpO2 98%   BMI 34.55 kg/m   Wt Readings from Last 3 Encounters:  05/25/24 244 lb 4 oz (110.8 kg)  05/24/24 237 lb (107.5 kg)  05/14/24 243 lb (110.2 kg)      Physical Exam Constitutional:      General: He is not in acute distress.    Appearance: Normal appearance. He is well-developed. He is not ill-appearing or toxic-appearing.  HENT:     Head: Normocephalic and atraumatic.     Right Ear: Hearing, tympanic membrane, ear canal and external ear normal.     Left Ear: Hearing, tympanic membrane, ear canal and external ear normal.     Nose: Nose normal.     Mouth/Throat:     Pharynx: Uvula midline.  Eyes:     General: Lids are normal. Lids are everted, no foreign bodies appreciated.     Conjunctiva/sclera: Conjunctivae normal.     Pupils: Pupils are equal, round, and reactive to light.  Neck:     Thyroid : No thyroid  mass or thyromegaly.     Vascular: No carotid bruit.     Trachea: Trachea and phonation normal.  Cardiovascular:     Rate and Rhythm: Normal rate and regular rhythm.     Pulses: Normal pulses.     Heart sounds: S1 normal and S2 normal. No murmur heard.    No gallop.  Pulmonary:     Breath sounds: Normal breath sounds. Decreased air movement present. No wheezing, rhonchi or rales.  Abdominal:     General: Bowel sounds are normal.     Palpations: Abdomen is soft.      Tenderness: There is no abdominal tenderness. There is no guarding or rebound.     Hernia: No hernia is present.  Musculoskeletal:     Cervical back: Normal range of motion and neck supple.     Thoracic back: No spasms, tenderness or bony tenderness. Normal range of motion.     Lumbar back: No tenderness or bony tenderness. Normal range of motion. Negative right straight leg raise test and negative left straight leg raise test.  Lymphadenopathy:     Cervical: No cervical adenopathy.  Skin:    General:  Skin is warm and dry.     Findings: No rash.  Neurological:     Mental Status: He is alert.     Cranial Nerves: No cranial nerve deficit.     Sensory: No sensory deficit.     Gait: Gait normal.     Deep Tendon Reflexes: Reflexes are normal and symmetric.  Psychiatric:        Speech: Speech normal.        Behavior: Behavior normal.        Judgment: Judgment normal.       Diabetic foot exam: Normal inspection No skin breakdown No calluses  Normal DP pulses Normal sensation to light touch and monofilament Nails normal  Results for orders placed or performed in visit on 05/25/24  HM DIABETES FOOT EXAM   Collection Time: 05/25/24 12:00 AM  Result Value Ref Range   HM Diabetic Foot Exam done      COVID 19 screen:  No recent travel or known exposure to COVID19 The patient denies respiratory symptoms of COVID 19 at this time. The importance of social distancing was discussed today.   Assessment and Plan   The patient's preventative maintenance and recommended screening tests for an annual wellness exam were reviewed in full today. Brought up to date unless services declined.  Counselled on the importance of diet, exercise, and its role in overall health and mortality. The patient's FH and SH was reviewed, including their home life, tobacco status, and drug and alcohol status.   Vaccines:uptodate, Tdap  and shingrix due. Prostate Cancer Screen:stable PSA  Lab Results   Component Value Date   PSA1 0.4 05/20/2024   PSA1 0.3 02/28/2023   PSA1 0.5 02/22/2022   PSA 0.46 05/18/2024   PSA 0.49 07/14/2015   PSA 0.48 12/20/2013  Colon Cancer Screen: 08/2015 colonoscopy.. repeat in 10 years      Smoking Status:nonsmoker.. dips ETOH/ drug ldz:wnwz  Hep C: neg  HIV screen:  neg   Problem List Items Addressed This Visit     Chronic asthma   Chronic bronchitis, unspecified chronic bronchitis type (HCC)   Chronic, poor overall controlled despite maximal treatment. On Trelegy.  followed by pulmonary.       Controlled diabetes mellitus with circulatory complication  ( HTN) (HCC)   Excellent control with diet.      Gout   Stable, chronic.  Continue current medication.    allopurinol  300 mg daily      History of substance abuse (HCC)   No current issues.       Hyperlipidemia associated with type 2 diabetes mellitus (HCC)   Stable, chronic.  Continue current medication.  LDL at goal less than 100 on simvastatin  20 mg daily      Hypertension associated with diabetes (HCC)    Chronic, elevated in office... check at home... may need to increase amlodipine  if persistently high.   lisinopril  40 mg daily.  Amlodipine  2.5 mg daily       MDD (major depressive disorder), single episode, moderate (HCC)   Chronic, stable  Citalopram  40 mg p.o. daily      Other Visit Diagnoses       Routine general medical examination at a health care facility    -  Primary        Greig Ring, MD

## 2024-05-25 NOTE — Assessment & Plan Note (Signed)
 Stable, chronic.  Continue current medication.    allopurinol  300 mg daily

## 2024-05-25 NOTE — Assessment & Plan Note (Signed)
Excellent control with diet  

## 2024-06-16 ENCOUNTER — Other Ambulatory Visit: Payer: Self-pay | Admitting: Family Medicine

## 2024-06-16 MED ORDER — CITALOPRAM HYDROBROMIDE 40 MG PO TABS: 40.0000 mg | ORAL_TABLET | Freq: Every day | ORAL | 1 refills | Status: AC

## 2024-06-16 MED ORDER — AMLODIPINE BESYLATE 2.5 MG PO TABS: 2.5000 mg | ORAL_TABLET | Freq: Every day | ORAL | 1 refills | Status: AC

## 2024-06-16 MED ORDER — MONTELUKAST SODIUM 10 MG PO TABS: 10.0000 mg | ORAL_TABLET | Freq: Every day | ORAL | 1 refills | Status: AC

## 2024-06-16 MED ORDER — ALLOPURINOL 300 MG PO TABS
300.0000 mg | ORAL_TABLET | Freq: Every day | ORAL | 1 refills | Status: AC
Start: 2024-06-16 — End: ?

## 2024-06-16 MED ORDER — LISINOPRIL 40 MG PO TABS: 40.0000 mg | ORAL_TABLET | Freq: Every day | ORAL | 1 refills | Status: AC

## 2024-06-16 MED ORDER — SIMVASTATIN 20 MG PO TABS
20.0000 mg | ORAL_TABLET | Freq: Every day | ORAL | 1 refills | Status: AC
Start: 2024-06-16 — End: ?

## 2024-06-16 MED ORDER — TRELEGY ELLIPTA 200-62.5-25 MCG/ACT IN AEPB: 1.0000 | INHALATION_SPRAY | Freq: Every day | RESPIRATORY_TRACT | 1 refills | Status: AC

## 2024-06-16 NOTE — Telephone Encounter (Signed)
 Don from [facility] is calling to say that she needs new orders on the medication that he is requesting. She states that he is on vacation in [place]  that's why he wanted them moved there. But she says it would be easier if dr avelina sends new orders to her.

## 2024-06-16 NOTE — Telephone Encounter (Signed)
 Left message for Cody Day that we received refill request for him from [facility] in [place].  I ask that he call me back to let us  know if he has moved or is out there on vacation and requested these refills to this pharmacy.

## 2024-07-09 ENCOUNTER — Encounter: Payer: Self-pay | Admitting: Pharmacist

## 2024-07-09 NOTE — Progress Notes (Signed)
 Pharmacy Quality Measure Review  This patient is appearing on a report for being at risk of failing the adherence measure for hypertension (ACEi/ARB) medications this calendar year.   Medication: lisinopril  40 mg Last fill date: 03/19/24 for 90 day supply  Insurance report was not up to date. No action needed at this time.  Medication has been refilled as of 06/16/24 x90 day supply. There is 1 additional 90ds refill remaining.

## 2024-09-09 ENCOUNTER — Other Ambulatory Visit: Payer: Self-pay | Admitting: Family Medicine

## 2024-11-02 ENCOUNTER — Other Ambulatory Visit: Payer: Self-pay | Admitting: Family Medicine

## 2024-11-02 ENCOUNTER — Telehealth: Payer: Self-pay | Admitting: *Deleted

## 2024-11-02 DIAGNOSIS — E1169 Type 2 diabetes mellitus with other specified complication: Secondary | ICD-10-CM

## 2024-11-02 DIAGNOSIS — E559 Vitamin D deficiency, unspecified: Secondary | ICD-10-CM

## 2024-11-02 DIAGNOSIS — Z125 Encounter for screening for malignant neoplasm of prostate: Secondary | ICD-10-CM

## 2024-11-02 DIAGNOSIS — E119 Type 2 diabetes mellitus without complications: Secondary | ICD-10-CM

## 2024-11-02 NOTE — Telephone Encounter (Signed)
 Please schedule CPE with fasting labs prior for Dr. Watt for after May 25 2025.

## 2024-11-02 NOTE — Telephone Encounter (Signed)
 Copied from CRM [id number]. Topic: Clinical - Request for Lab/Test Order >> Nov 02, 2024  1:44 PM Wess RAMAN wrote: Reason for CRM: Patient's wife, Jon, would like lab orders placed prior to patient's upcoming wellness visit.  Callback #: [id number]

## 2024-11-02 NOTE — Telephone Encounter (Signed)
 I called and LVM.

## 2024-11-24 ENCOUNTER — Other Ambulatory Visit

## 2024-11-30 ENCOUNTER — Other Ambulatory Visit

## 2025-05-17 ENCOUNTER — Ambulatory Visit

## 2025-05-24 ENCOUNTER — Other Ambulatory Visit

## 2025-05-31 ENCOUNTER — Ambulatory Visit: Admitting: Urology
